# Patient Record
Sex: Male | Born: 1940 | Race: Black or African American | Hispanic: No | Marital: Married | Smoking: Former smoker
Health system: Southern US, Community
[De-identification: ages and names within clinical notes are randomized; demographics above are authoritative.]

## PROBLEM LIST (undated history)

## (undated) DIAGNOSIS — K449 Diaphragmatic hernia without obstruction or gangrene: Secondary | ICD-10-CM

## (undated) DIAGNOSIS — I2510 Atherosclerotic heart disease of native coronary artery without angina pectoris: Secondary | ICD-10-CM

## (undated) DIAGNOSIS — I219 Acute myocardial infarction, unspecified: Secondary | ICD-10-CM

## (undated) DIAGNOSIS — M109 Gout, unspecified: Secondary | ICD-10-CM

## (undated) DIAGNOSIS — E119 Type 2 diabetes mellitus without complications: Secondary | ICD-10-CM

## (undated) DIAGNOSIS — E785 Hyperlipidemia, unspecified: Secondary | ICD-10-CM

## (undated) DIAGNOSIS — M7710 Lateral epicondylitis, unspecified elbow: Secondary | ICD-10-CM

## (undated) DIAGNOSIS — K219 Gastro-esophageal reflux disease without esophagitis: Secondary | ICD-10-CM

## (undated) DIAGNOSIS — I739 Peripheral vascular disease, unspecified: Secondary | ICD-10-CM

## (undated) DIAGNOSIS — I10 Essential (primary) hypertension: Secondary | ICD-10-CM

## (undated) DIAGNOSIS — K2960 Other gastritis without bleeding: Secondary | ICD-10-CM

## (undated) DIAGNOSIS — I7300 Raynaud's syndrome without gangrene: Secondary | ICD-10-CM

## (undated) DIAGNOSIS — N189 Chronic kidney disease, unspecified: Secondary | ICD-10-CM

## (undated) HISTORY — DX: Essential (primary) hypertension: I10

## (undated) HISTORY — DX: Peripheral vascular disease, unspecified: I73.9

## (undated) HISTORY — DX: Gastro-esophageal reflux disease without esophagitis: K21.9

## (undated) HISTORY — PX: CORONARY ARTERY BYPASS GRAFT: SHX141

## (undated) HISTORY — DX: Gout, unspecified: M10.9

## (undated) HISTORY — DX: Other gastritis without bleeding: K29.60

## (undated) HISTORY — DX: Chronic kidney disease, unspecified: N18.9

## (undated) HISTORY — DX: Diaphragmatic hernia without obstruction or gangrene: K44.9

## (undated) HISTORY — DX: Atherosclerotic heart disease of native coronary artery without angina pectoris: I25.10

## (undated) HISTORY — DX: Type 2 diabetes mellitus without complications: E11.9

## (undated) HISTORY — DX: Acute myocardial infarction, unspecified: I21.9

## (undated) HISTORY — DX: Hyperlipidemia, unspecified: E78.5

## (undated) HISTORY — PX: COLONOSCOPY: SHX174

## (undated) HISTORY — DX: Lateral epicondylitis, unspecified elbow: M77.10

## (undated) HISTORY — PX: UPPER GASTROINTESTINAL ENDOSCOPY: SHX188

## (undated) HISTORY — DX: Raynaud's syndrome without gangrene: I73.00

---

## 1999-04-03 ENCOUNTER — Encounter: Payer: Self-pay | Admitting: Internal Medicine

## 2000-10-30 ENCOUNTER — Ambulatory Visit (HOSPITAL_COMMUNITY): Admission: RE | Admit: 2000-10-30 | Discharge: 2000-10-30 | Payer: Self-pay | Admitting: Gastroenterology

## 2000-10-30 ENCOUNTER — Encounter: Payer: Self-pay | Admitting: Gastroenterology

## 2001-03-18 ENCOUNTER — Encounter: Admission: RE | Admit: 2001-03-18 | Discharge: 2001-06-16 | Payer: Self-pay | Admitting: Internal Medicine

## 2001-07-20 ENCOUNTER — Ambulatory Visit (HOSPITAL_COMMUNITY): Admission: RE | Admit: 2001-07-20 | Discharge: 2001-07-20 | Payer: Self-pay | Admitting: *Deleted

## 2003-07-01 ENCOUNTER — Encounter: Payer: Self-pay | Admitting: Internal Medicine

## 2003-07-01 LAB — CONVERTED CEMR LAB

## 2004-06-19 ENCOUNTER — Ambulatory Visit: Payer: Self-pay | Admitting: Internal Medicine

## 2004-07-18 ENCOUNTER — Ambulatory Visit: Payer: Self-pay | Admitting: Gastroenterology

## 2004-07-19 ENCOUNTER — Ambulatory Visit: Payer: Self-pay | Admitting: Gastroenterology

## 2004-08-05 ENCOUNTER — Ambulatory Visit (HOSPITAL_COMMUNITY): Admission: RE | Admit: 2004-08-05 | Discharge: 2004-08-05 | Payer: Self-pay | Admitting: Gastroenterology

## 2004-08-13 ENCOUNTER — Ambulatory Visit: Payer: Self-pay | Admitting: Gastroenterology

## 2004-09-12 ENCOUNTER — Ambulatory Visit: Payer: Self-pay | Admitting: Gastroenterology

## 2004-09-13 ENCOUNTER — Ambulatory Visit: Payer: Self-pay | Admitting: Gastroenterology

## 2004-09-17 ENCOUNTER — Ambulatory Visit (HOSPITAL_COMMUNITY): Admission: RE | Admit: 2004-09-17 | Discharge: 2004-09-17 | Payer: Self-pay | Admitting: Gastroenterology

## 2004-09-27 ENCOUNTER — Ambulatory Visit: Payer: Self-pay | Admitting: Gastroenterology

## 2004-11-28 ENCOUNTER — Ambulatory Visit: Payer: Self-pay | Admitting: Gastroenterology

## 2005-10-22 ENCOUNTER — Ambulatory Visit: Payer: Self-pay | Admitting: Internal Medicine

## 2005-10-29 ENCOUNTER — Ambulatory Visit: Payer: Self-pay | Admitting: Internal Medicine

## 2005-12-23 ENCOUNTER — Ambulatory Visit: Payer: Self-pay | Admitting: Internal Medicine

## 2005-12-30 ENCOUNTER — Ambulatory Visit: Payer: Self-pay | Admitting: Internal Medicine

## 2006-01-05 ENCOUNTER — Ambulatory Visit: Payer: Self-pay

## 2006-03-04 ENCOUNTER — Ambulatory Visit: Payer: Self-pay | Admitting: Internal Medicine

## 2006-03-11 ENCOUNTER — Encounter: Admission: RE | Admit: 2006-03-11 | Discharge: 2006-03-11 | Payer: Self-pay | Admitting: Internal Medicine

## 2006-03-11 ENCOUNTER — Ambulatory Visit: Payer: Self-pay | Admitting: Internal Medicine

## 2006-07-16 ENCOUNTER — Encounter: Payer: Self-pay | Admitting: Internal Medicine

## 2006-07-23 ENCOUNTER — Ambulatory Visit: Payer: Self-pay | Admitting: Internal Medicine

## 2006-07-23 LAB — CONVERTED CEMR LAB
Cholesterol: 183 mg/dL (ref 0–200)
HDL: 33.6 mg/dL — ABNORMAL LOW (ref >39.0)
LDL Cholesterol: 127 mg/dL — ABNORMAL HIGH (ref 0–99)
Testosterone: 387.18 ng/dL (ref 350.00–890)
Total CHOL/HDL Ratio: 5.4
Triglycerides: 110 mg/dL (ref 0–149)
VLDL: 22 mg/dL (ref 0–40)

## 2006-08-03 ENCOUNTER — Ambulatory Visit: Payer: Self-pay | Admitting: Oncology

## 2006-08-27 ENCOUNTER — Ambulatory Visit: Payer: Self-pay | Admitting: Internal Medicine

## 2006-08-27 LAB — CONVERTED CEMR LAB
ALT: 17 units/L (ref 0–40)
AST: 21 units/L (ref 0–37)
Albumin: 3.9 g/dL (ref 3.5–5.2)
Alkaline Phosphatase: 46 units/L (ref 39–117)
BUN: 18 mg/dL (ref 6–23)
Bilirubin, Direct: 0.2 mg/dL (ref 0.0–0.3)
CO2: 28 meq/L (ref 19–32)
Calcium: 9.4 mg/dL (ref 8.4–10.5)
Chloride: 103 meq/L (ref 96–112)
Cholesterol: 123 mg/dL (ref 0–200)
Creatinine, Ser: 1.5 mg/dL (ref 0.4–1.5)
GFR calc Af Amer: 61 mL/min
GFR calc non Af Amer: 50 mL/min
Glucose, Bld: 133 mg/dL — ABNORMAL HIGH (ref 70–99)
HDL: 32.2 mg/dL — ABNORMAL LOW (ref >39.0)
LDL Cholesterol: 74 mg/dL (ref 0–99)
Phosphorus: 3.4 mg/dL (ref 2.3–4.6)
Potassium: 5 meq/L (ref 3.5–5.1)
Sodium: 138 meq/L (ref 135–145)
Total Bilirubin: 0.8 mg/dL (ref 0.3–1.2)
Total CHOL/HDL Ratio: 3.8
Total Protein: 6.7 g/dL (ref 6.0–8.3)
Triglycerides: 83 mg/dL (ref 0–149)
VLDL: 17 mg/dL (ref 0–40)

## 2006-09-01 LAB — CBC WITH DIFFERENTIAL/PLATELET
BASO%: 0.4 % (ref 0.0–2.0)
Basophils Absolute: 0 10*3/uL (ref 0.0–0.1)
HCT: 45 % (ref 38.7–49.9)
HGB: 15.3 g/dL (ref 13.0–17.1)
MONO#: 0.3 10*3/uL (ref 0.1–0.9)
NEUT%: 67.6 % (ref 40.0–75.0)
RDW: 14.3 % (ref 11.2–14.6)
WBC: 4.4 10*3/uL (ref 4.0–10.0)
lymph#: 1 10*3/uL (ref 0.9–3.3)

## 2006-09-03 ENCOUNTER — Ambulatory Visit: Payer: Self-pay | Admitting: Internal Medicine

## 2006-09-03 LAB — CONVERTED CEMR LAB: Testosterone: 504.09 ng/dL (ref 350.00–890)

## 2006-09-04 LAB — SPEP & IFE WITH QIG
Alpha-2-Globulin: 9.8 % (ref 7.1–11.8)
Beta Globulin: 6.6 % (ref 4.7–7.2)
Gamma Globulin: 17.5 % (ref 11.1–18.8)
IgG (Immunoglobin G), Serum: 1380 mg/dL (ref 694–1618)
M-Spike, %: 0.37 g/dL

## 2006-09-04 LAB — LACTATE DEHYDROGENASE: LDH: 171 U/L (ref 94–250)

## 2006-09-04 LAB — COMPREHENSIVE METABOLIC PANEL
ALT: 12 U/L (ref 0–53)
AST: 18 U/L (ref 0–37)
Albumin: 4.4 g/dL (ref 3.5–5.2)
CO2: 29 mEq/L (ref 19–32)
Calcium: 9.5 mg/dL (ref 8.4–10.5)
Chloride: 106 mEq/L (ref 96–112)
Creatinine, Ser: 1.39 mg/dL (ref 0.40–1.50)
Potassium: 4.5 mEq/L (ref 3.5–5.3)

## 2006-09-04 LAB — KAPPA/LAMBDA LIGHT CHAINS: Kappa:Lambda Ratio: 1.95 — ABNORMAL HIGH (ref 0.26–1.65)

## 2006-09-09 LAB — UIFE/LIGHT CHAINS/TP QN, 24-HR UR
Albumin, U: DETECTED
Free Lambda Excretion/Day: 1.79 mg/d
Free Lambda Lt Chains,Ur: 0.11 mg/dL (ref 0.08–1.01)
Free Lt Chn Excr Rate: 25.35 mg/d
Time: 24 hours
Total Protein, Urine-Ur/day: 37 mg/d (ref 10–140)
Volume, Urine: 1625 mL

## 2006-09-15 ENCOUNTER — Ambulatory Visit (HOSPITAL_COMMUNITY): Admission: RE | Admit: 2006-09-15 | Discharge: 2006-09-15 | Payer: Self-pay | Admitting: Oncology

## 2006-09-18 ENCOUNTER — Ambulatory Visit: Payer: Self-pay | Admitting: Oncology

## 2006-12-28 ENCOUNTER — Encounter: Payer: Self-pay | Admitting: Internal Medicine

## 2006-12-28 DIAGNOSIS — E785 Hyperlipidemia, unspecified: Secondary | ICD-10-CM

## 2006-12-28 DIAGNOSIS — M1A9XX1 Chronic gout, unspecified, with tophus (tophi): Secondary | ICD-10-CM

## 2006-12-28 DIAGNOSIS — M109 Gout, unspecified: Secondary | ICD-10-CM

## 2006-12-28 DIAGNOSIS — E119 Type 2 diabetes mellitus without complications: Secondary | ICD-10-CM

## 2006-12-28 DIAGNOSIS — I2510 Atherosclerotic heart disease of native coronary artery without angina pectoris: Secondary | ICD-10-CM

## 2006-12-28 DIAGNOSIS — E1159 Type 2 diabetes mellitus with other circulatory complications: Secondary | ICD-10-CM

## 2006-12-28 DIAGNOSIS — I10 Essential (primary) hypertension: Secondary | ICD-10-CM

## 2006-12-28 DIAGNOSIS — E1169 Type 2 diabetes mellitus with other specified complication: Secondary | ICD-10-CM

## 2006-12-28 DIAGNOSIS — E1129 Type 2 diabetes mellitus with other diabetic kidney complication: Secondary | ICD-10-CM | POA: Insufficient documentation

## 2006-12-28 HISTORY — DX: Type 2 diabetes mellitus without complications: E11.9

## 2006-12-28 HISTORY — DX: Atherosclerotic heart disease of native coronary artery without angina pectoris: I25.10

## 2006-12-28 HISTORY — DX: Hyperlipidemia, unspecified: E78.5

## 2006-12-28 HISTORY — DX: Essential (primary) hypertension: I10

## 2006-12-28 HISTORY — DX: Gout, unspecified: M10.9

## 2007-01-20 ENCOUNTER — Ambulatory Visit: Payer: Self-pay | Admitting: Oncology

## 2007-01-22 ENCOUNTER — Encounter: Payer: Self-pay | Admitting: Internal Medicine

## 2007-01-22 LAB — CBC WITH DIFFERENTIAL/PLATELET
BASO%: 0.4 % (ref 0.0–2.0)
EOS%: 3.9 % (ref 0.0–7.0)
LYMPH%: 19.4 % (ref 14.0–48.0)
MCH: 29.2 pg (ref 28.0–33.4)
MCHC: 34.7 g/dL (ref 32.0–35.9)
MONO#: 0.4 10*3/uL (ref 0.1–0.9)
NEUT%: 68.8 % (ref 40.0–75.0)
Platelets: 189 10*3/uL (ref 145–400)
RBC: 5.4 10*6/uL (ref 4.20–5.71)
WBC: 5.4 10*3/uL (ref 4.0–10.0)

## 2007-01-26 ENCOUNTER — Ambulatory Visit: Payer: Self-pay | Admitting: Internal Medicine

## 2007-01-26 LAB — COMPREHENSIVE METABOLIC PANEL
ALT: 21 U/L (ref 0–53)
AST: 19 U/L (ref 0–37)
Alkaline Phosphatase: 57 U/L (ref 39–117)
CO2: 26 mEq/L (ref 19–32)
Creatinine, Ser: 1.41 mg/dL (ref 0.40–1.50)
Sodium: 141 mEq/L (ref 135–145)
Total Bilirubin: 0.4 mg/dL (ref 0.3–1.2)
Total Protein: 7 g/dL (ref 6.0–8.3)

## 2007-01-26 LAB — SPEP & IFE WITH QIG
Albumin ELP: 58.2 % (ref 55.8–66.1)
Alpha-1-Globulin: 4 % (ref 2.9–4.9)
Gamma Globulin: 18.3 % (ref 11.1–18.8)
IgM, Serum: 197 mg/dL (ref 60–263)
Total Protein, Serum Electrophoresis: 7 g/dL (ref 6.0–8.3)

## 2007-01-26 LAB — LACTATE DEHYDROGENASE: LDH: 166 U/L (ref 94–250)

## 2007-01-27 LAB — CONVERTED CEMR LAB
ALT: 25 units/L (ref 0–53)
AST: 22 units/L (ref 0–37)
Albumin: 3.7 g/dL (ref 3.5–5.2)
Alkaline Phosphatase: 52 units/L (ref 39–117)
Bilirubin, Direct: 0.1 mg/dL (ref 0.0–0.3)
Cholesterol: 148 mg/dL (ref 0–200)
HDL: 22.3 mg/dL — ABNORMAL LOW (ref >39.0)
Hgb A1c MFr Bld: 7.3 % — ABNORMAL HIGH (ref 4.6–6.0)
LDL Cholesterol: 99 mg/dL (ref 0–99)
Total Bilirubin: 0.8 mg/dL (ref 0.3–1.2)
Total CHOL/HDL Ratio: 6.6
Total Protein: 6.5 g/dL (ref 6.0–8.3)
Triglycerides: 134 mg/dL (ref 0–149)
VLDL: 27 mg/dL (ref 0–40)

## 2007-05-10 ENCOUNTER — Ambulatory Visit: Payer: Self-pay | Admitting: Internal Medicine

## 2007-05-10 LAB — CONVERTED CEMR LAB
ALT: 23 units/L (ref 0–53)
AST: 23 units/L (ref 0–37)
Albumin: 3.8 g/dL (ref 3.5–5.2)
Alkaline Phosphatase: 58 units/L (ref 39–117)
BUN: 18 mg/dL (ref 6–23)
Bilirubin, Direct: 0.1 mg/dL (ref 0.0–0.3)
CO2: 33 meq/L — ABNORMAL HIGH (ref 19–32)
Calcium: 9.9 mg/dL (ref 8.4–10.5)
Chloride: 104 meq/L (ref 96–112)
Cholesterol: 148 mg/dL (ref 0–200)
Creatinine, Ser: 1.4 mg/dL (ref 0.4–1.5)
GFR calc Af Amer: 66 mL/min
GFR calc non Af Amer: 54 mL/min
Glucose, Bld: 142 mg/dL — ABNORMAL HIGH (ref 70–99)
HDL: 30.9 mg/dL — ABNORMAL LOW (ref >39.0)
Hgb A1c MFr Bld: 7.5 % — ABNORMAL HIGH (ref 4.6–6.0)
LDL Cholesterol: 78 mg/dL (ref 0–99)
Potassium: 5.1 meq/L (ref 3.5–5.1)
Sodium: 143 meq/L (ref 135–145)
Total Bilirubin: 0.8 mg/dL (ref 0.3–1.2)
Total CHOL/HDL Ratio: 4.8
Total Protein: 7.1 g/dL (ref 6.0–8.3)
Triglycerides: 195 mg/dL — ABNORMAL HIGH (ref 0–149)
VLDL: 39 mg/dL (ref 0–40)

## 2007-05-14 ENCOUNTER — Ambulatory Visit: Payer: Self-pay | Admitting: Internal Medicine

## 2007-05-14 DIAGNOSIS — I739 Peripheral vascular disease, unspecified: Secondary | ICD-10-CM

## 2007-05-14 HISTORY — DX: Peripheral vascular disease, unspecified: I73.9

## 2007-05-26 ENCOUNTER — Encounter: Payer: Self-pay | Admitting: Internal Medicine

## 2007-05-26 ENCOUNTER — Ambulatory Visit: Payer: Self-pay

## 2007-06-10 ENCOUNTER — Telehealth: Payer: Self-pay | Admitting: Internal Medicine

## 2007-06-14 ENCOUNTER — Ambulatory Visit: Payer: Self-pay | Admitting: Cardiovascular Disease

## 2007-06-14 LAB — CONVERTED CEMR LAB
CO2: 31 meq/L (ref 19–32)
Chloride: 102 meq/L (ref 96–112)
Creatinine, Ser: 1.5 mg/dL (ref 0.4–1.5)
Sodium: 140 meq/L (ref 135–145)

## 2007-06-16 ENCOUNTER — Ambulatory Visit: Payer: Self-pay | Admitting: Cardiovascular Disease

## 2007-07-28 ENCOUNTER — Ambulatory Visit: Payer: Self-pay | Admitting: Oncology

## 2007-07-30 ENCOUNTER — Encounter: Payer: Self-pay | Admitting: Internal Medicine

## 2007-07-30 LAB — CBC WITH DIFFERENTIAL/PLATELET
BASO%: 0.1 % (ref 0.0–2.0)
Eosinophils Absolute: 0.2 10*3/uL (ref 0.0–0.5)
HCT: 45.8 % (ref 38.7–49.9)
HGB: 15.2 g/dL (ref 13.0–17.1)
MCHC: 33.2 g/dL (ref 32.0–35.9)
MONO#: 0.4 10*3/uL (ref 0.1–0.9)
NEUT#: 3.4 10*3/uL (ref 1.5–6.5)
NEUT%: 65.9 % (ref 40.0–75.0)
WBC: 5.1 10*3/uL (ref 4.0–10.0)
lymph#: 1.1 10*3/uL (ref 0.9–3.3)

## 2007-08-04 LAB — SPEP & IFE WITH QIG
Albumin ELP: 57.2 % (ref 55.8–66.1)
Alpha-2-Globulin: 10.4 % (ref 7.1–11.8)
Beta Globulin: 6.3 % (ref 4.7–7.2)
Total Protein, Serum Electrophoresis: 7.5 g/dL (ref 6.0–8.3)

## 2007-08-04 LAB — COMPREHENSIVE METABOLIC PANEL
ALT: 14 U/L (ref 0–53)
CO2: 29 mEq/L (ref 19–32)
Calcium: 9.7 mg/dL (ref 8.4–10.5)
Chloride: 103 mEq/L (ref 96–112)
Sodium: 142 mEq/L (ref 135–145)
Total Protein: 7.5 g/dL (ref 6.0–8.3)

## 2007-08-04 LAB — KAPPA/LAMBDA LIGHT CHAINS: Kappa free light chain: 1.93 mg/dL (ref 0.33–1.94)

## 2007-09-03 ENCOUNTER — Encounter: Payer: Self-pay | Admitting: Internal Medicine

## 2007-09-06 ENCOUNTER — Ambulatory Visit: Payer: Self-pay | Admitting: Cardiovascular Disease

## 2007-10-25 ENCOUNTER — Telehealth: Payer: Self-pay | Admitting: Internal Medicine

## 2008-01-21 ENCOUNTER — Ambulatory Visit: Payer: Self-pay | Admitting: Oncology

## 2008-03-03 ENCOUNTER — Ambulatory Visit: Payer: Self-pay | Admitting: Oncology

## 2008-03-03 ENCOUNTER — Ambulatory Visit: Payer: Self-pay | Admitting: Internal Medicine

## 2008-03-08 ENCOUNTER — Ambulatory Visit: Payer: Self-pay | Admitting: Oncology

## 2008-03-08 ENCOUNTER — Encounter: Payer: Self-pay | Admitting: Internal Medicine

## 2008-03-08 LAB — CBC WITH DIFFERENTIAL/PLATELET
BASO%: 0.4 % (ref 0.0–2.0)
Eosinophils Absolute: 0.2 10*3/uL (ref 0.0–0.5)
HCT: 45.1 % (ref 38.7–49.9)
LYMPH%: 18.9 % (ref 14.0–48.0)
MCHC: 33.4 g/dL (ref 32.0–35.9)
MONO#: 0.3 10*3/uL (ref 0.1–0.9)
NEUT#: 2.9 10*3/uL (ref 1.5–6.5)
NEUT%: 68.3 % (ref 40.0–75.0)
Platelets: 175 10*3/uL (ref 145–400)
RBC: 5.19 10*6/uL (ref 4.20–5.71)
WBC: 4.3 10*3/uL (ref 4.0–10.0)
lymph#: 0.8 10*3/uL — ABNORMAL LOW (ref 0.9–3.3)

## 2008-03-10 LAB — COMPREHENSIVE METABOLIC PANEL
ALT: 16 U/L (ref 0–53)
CO2: 27 mEq/L (ref 19–32)
Calcium: 9.6 mg/dL (ref 8.4–10.5)
Chloride: 104 mEq/L (ref 96–112)
Glucose, Bld: 161 mg/dL — ABNORMAL HIGH (ref 70–99)
Sodium: 140 mEq/L (ref 135–145)
Total Bilirubin: 0.6 mg/dL (ref 0.3–1.2)
Total Protein: 7.1 g/dL (ref 6.0–8.3)

## 2008-03-10 LAB — SPEP & IFE WITH QIG
Albumin ELP: 57.5 % (ref 55.8–66.1)
Alpha-1-Globulin: 3.4 % (ref 2.9–4.9)
IgM, Serum: 181 mg/dL (ref 60–263)
Total Protein, Serum Electrophoresis: 7.1 g/dL (ref 6.0–8.3)

## 2008-05-31 ENCOUNTER — Encounter: Payer: Self-pay | Admitting: Internal Medicine

## 2008-06-09 ENCOUNTER — Ambulatory Visit: Payer: Self-pay | Admitting: Internal Medicine

## 2008-06-12 LAB — CONVERTED CEMR LAB
ALT: 15 units/L (ref 0–53)
BUN: 19 mg/dL (ref 6–23)
Cholesterol: 133 mg/dL (ref 0–200)
Creatinine, Ser: 1.4 mg/dL (ref 0.4–1.5)
GFR calc Af Amer: 65 mL/min
GFR calc non Af Amer: 54 mL/min
LDL Cholesterol: 81 mg/dL (ref 0–99)
Potassium: 4.3 meq/L (ref 3.5–5.1)
Total CHOL/HDL Ratio: 3.2
VLDL: 10 mg/dL (ref 0–40)

## 2008-06-14 ENCOUNTER — Ambulatory Visit: Payer: Self-pay | Admitting: Internal Medicine

## 2008-08-30 ENCOUNTER — Ambulatory Visit: Payer: Self-pay | Admitting: Internal Medicine

## 2008-08-30 LAB — CONVERTED CEMR LAB
ALT: 17 units/L (ref 0–53)
AST: 22 units/L (ref 0–37)
Albumin: 3.6 g/dL (ref 3.5–5.2)
BUN: 18 mg/dL (ref 6–23)
CO2: 32 meq/L (ref 19–32)
Calcium: 9.6 mg/dL (ref 8.4–10.5)
Creatinine, Ser: 1.4 mg/dL (ref 0.4–1.5)
Glucose, Bld: 87 mg/dL (ref 70–99)
Hgb A1c MFr Bld: 7.5 % — ABNORMAL HIGH (ref 4.6–6.0)
LDL Cholesterol: 79 mg/dL (ref 0–99)
Potassium: 3.7 meq/L (ref 3.5–5.1)
Sodium: 144 meq/L (ref 135–145)
Total Protein: 7 g/dL (ref 6.0–8.3)
VLDL: 13 mg/dL (ref 0–40)

## 2008-09-15 ENCOUNTER — Ambulatory Visit: Payer: Self-pay | Admitting: Internal Medicine

## 2008-09-15 DIAGNOSIS — N183 Chronic kidney disease, stage 3 (moderate): Secondary | ICD-10-CM

## 2008-09-15 DIAGNOSIS — N189 Chronic kidney disease, unspecified: Secondary | ICD-10-CM

## 2008-09-15 HISTORY — DX: Chronic kidney disease, unspecified: N18.9

## 2008-10-04 ENCOUNTER — Ambulatory Visit: Payer: Self-pay | Admitting: Oncology

## 2009-01-05 ENCOUNTER — Ambulatory Visit: Payer: Self-pay | Admitting: Internal Medicine

## 2009-01-05 ENCOUNTER — Encounter: Payer: Self-pay | Admitting: Internal Medicine

## 2009-01-18 LAB — CONVERTED CEMR LAB
ALT: 19 units/L (ref 0–53)
Alkaline Phosphatase: 49 units/L (ref 39–117)
BUN: 23 mg/dL (ref 6–23)
Bilirubin, Direct: 0.1 mg/dL (ref 0.0–0.3)
Cholesterol: 134 mg/dL (ref 0–200)
Creatinine, Ser: 1.5 mg/dL (ref 0.4–1.5)
GFR calc non Af Amer: 60.15 mL/min (ref >60)
Glucose, Bld: 88 mg/dL (ref 70–99)
Hgb A1c MFr Bld: 7.7 % — ABNORMAL HIGH (ref 4.6–6.5)
Potassium: 4.7 meq/L (ref 3.5–5.1)
Total Bilirubin: 0.9 mg/dL (ref 0.3–1.2)
Total Protein: 7.4 g/dL (ref 6.0–8.3)
VLDL: 12.8 mg/dL (ref 0.0–40.0)

## 2009-01-25 ENCOUNTER — Ambulatory Visit: Payer: Self-pay | Admitting: Internal Medicine

## 2009-04-04 ENCOUNTER — Telehealth: Payer: Self-pay | Admitting: Internal Medicine

## 2009-04-26 ENCOUNTER — Ambulatory Visit: Payer: Self-pay | Admitting: Internal Medicine

## 2009-04-26 LAB — CONVERTED CEMR LAB
AST: 22 units/L (ref 0–37)
Alkaline Phosphatase: 45 units/L (ref 39–117)
Bilirubin, Direct: 0 mg/dL (ref 0.0–0.3)
CO2: 29 meq/L (ref 19–32)
Calcium: 8.7 mg/dL (ref 8.4–10.5)
GFR calc non Af Amer: 52.01 mL/min (ref >60)
HDL: 39.7 mg/dL (ref >39.00)
Potassium: 5.2 meq/L — ABNORMAL HIGH (ref 3.5–5.1)
Sodium: 139 meq/L (ref 135–145)
Total CHOL/HDL Ratio: 4
VLDL: 12 mg/dL (ref 0.0–40.0)

## 2009-05-03 ENCOUNTER — Ambulatory Visit: Payer: Self-pay | Admitting: Internal Medicine

## 2009-08-07 ENCOUNTER — Telehealth: Payer: Self-pay | Admitting: Internal Medicine

## 2009-08-07 ENCOUNTER — Ambulatory Visit: Payer: Self-pay | Admitting: Internal Medicine

## 2009-08-07 LAB — CONVERTED CEMR LAB
Albumin: 3.8 g/dL (ref 3.5–5.2)
CO2: 34 meq/L — ABNORMAL HIGH (ref 19–32)
Chloride: 108 meq/L (ref 96–112)
Glucose, Bld: 99 mg/dL (ref 70–99)
HDL: 40.6 mg/dL (ref >39.00)
LDL Cholesterol: 79 mg/dL (ref 0–99)
Potassium: 5.2 meq/L — ABNORMAL HIGH (ref 3.5–5.1)
Sodium: 146 meq/L — ABNORMAL HIGH (ref 135–145)
Total Bilirubin: 0.5 mg/dL (ref 0.3–1.2)
Total CHOL/HDL Ratio: 3
Triglycerides: 57 mg/dL (ref 0.0–149.0)

## 2009-08-13 ENCOUNTER — Ambulatory Visit: Payer: Self-pay | Admitting: Internal Medicine

## 2009-09-04 ENCOUNTER — Encounter: Payer: Self-pay | Admitting: Internal Medicine

## 2009-09-19 ENCOUNTER — Telehealth: Payer: Self-pay | Admitting: Internal Medicine

## 2009-10-31 ENCOUNTER — Ambulatory Visit: Payer: Self-pay | Admitting: Internal Medicine

## 2009-11-01 ENCOUNTER — Telehealth: Payer: Self-pay | Admitting: Internal Medicine

## 2009-11-29 ENCOUNTER — Encounter: Payer: Self-pay | Admitting: Internal Medicine

## 2009-12-04 ENCOUNTER — Ambulatory Visit: Payer: Self-pay | Admitting: Internal Medicine

## 2009-12-04 LAB — CONVERTED CEMR LAB
Albumin: 4 g/dL (ref 3.5–5.2)
Alkaline Phosphatase: 51 units/L (ref 39–117)
Chloride: 104 meq/L (ref 96–112)
Cholesterol: 164 mg/dL (ref 0–200)
Creatinine, Ser: 1.6 mg/dL — ABNORMAL HIGH (ref 0.4–1.5)
HDL: 39.9 mg/dL (ref >39.00)
Hgb A1c MFr Bld: 8.1 % — ABNORMAL HIGH (ref 4.6–6.5)
LDL Cholesterol: 103 mg/dL — ABNORMAL HIGH (ref 0–99)
Sodium: 144 meq/L (ref 135–145)
Total Protein: 7.1 g/dL (ref 6.0–8.3)
Triglycerides: 107 mg/dL (ref 0.0–149.0)
VLDL: 21.4 mg/dL (ref 0.0–40.0)

## 2009-12-11 ENCOUNTER — Ambulatory Visit: Payer: Self-pay | Admitting: Internal Medicine

## 2009-12-11 DIAGNOSIS — M7710 Lateral epicondylitis, unspecified elbow: Secondary | ICD-10-CM | POA: Insufficient documentation

## 2009-12-11 HISTORY — DX: Lateral epicondylitis, unspecified elbow: M77.10

## 2010-04-03 ENCOUNTER — Ambulatory Visit: Payer: Self-pay | Admitting: Internal Medicine

## 2010-04-03 LAB — CONVERTED CEMR LAB
ALT: 12 units/L (ref 0–53)
AST: 19 units/L (ref 0–37)
Albumin: 3.7 g/dL (ref 3.5–5.2)
BUN: 22 mg/dL (ref 6–23)
Chloride: 107 meq/L (ref 96–112)
Cholesterol: 153 mg/dL (ref 0–200)
GFR calc non Af Amer: 59.47 mL/min (ref >60)
Glucose, Bld: 135 mg/dL — ABNORMAL HIGH (ref 70–99)
Hgb A1c MFr Bld: 8.1 % — ABNORMAL HIGH (ref 4.6–6.5)
Potassium: 4.6 meq/L (ref 3.5–5.1)
Triglycerides: 71 mg/dL (ref 0.0–149.0)
VLDL: 14.2 mg/dL (ref 0.0–40.0)

## 2010-04-10 ENCOUNTER — Ambulatory Visit: Payer: Self-pay | Admitting: Internal Medicine

## 2010-05-10 ENCOUNTER — Encounter: Payer: Self-pay | Admitting: Internal Medicine

## 2010-06-30 HISTORY — PX: KNEE ARTHROSCOPY: SUR90

## 2010-07-20 ENCOUNTER — Encounter: Payer: Self-pay | Admitting: Nephrology

## 2010-08-01 NOTE — Progress Notes (Signed)
Summary: Pt req med for gout as previously discussed  Phone Note Call from Patient Call back at Home Phone [phone]   Caller: Patient Summary of Call: Pt has flare up of gout. Pt says that Dr. Cato Mulligan, said that there was med he could recommend for it.  Initial call taken by: Lucy Antigua,  September 19, 2009 2:35 PM  Follow-up for Phone Call        trial colchicine 0.6 mg by mouth three times a day for 2 days then once daily for 2 days then stop may cause diarrhea. if recurrence is frequent then there is a medication to take daily that may prevent gout Follow-up by: Birdie Sons MD,  September 19, 2009 6:52 PM    New/Updated Medications: COLCRYS 0.6 MG TABS (COLCHICINE) take one tab three times a day for 2 days , one tab once daily for 2 days, then stop Prescriptions: COLCRYS 0.6 MG TABS (COLCHICINE) take one tab three times a day for 2 days , one tab once daily for 2 days, then stop  #8 x 0   Entered by:   Kern Reap CMA (AAMA)   Authorized by:   Birdie Sons MD   Signed by:   Kern Reap CMA (AAMA) on 09/20/2009   Method used:   Electronically to        [facility]  [address] [phone]* (retail)       [address]       [place], [place]  [id number]       Ph: [phone] or [phone]       Fax: [phone]   RxID:   [id number]

## 2010-08-01 NOTE — Progress Notes (Signed)
Summary: bloodwork results  Phone Note Call from Patient Call back at Home Phone [phone]   Caller: Patient Call For: Birdie Sons MD Summary of Call: pt needs lab results Initial call taken by: Heron Sabins,  Nov 01, 2009 4:40 PM  Follow-up for Phone Call        Pt called back again to check on status of labs that were done on Wed 10/31/09. Please call pt asap today.  Follow-up by: Lucy Antigua,  Nov 02, 2009 11:54 AM  Additional Follow-up for Phone Call Additional follow up Details #1::        Patient notified.  Additional Follow-up by: Gladis Riffle, RN,  Nov 02, 2009 1:03 PM

## 2010-08-01 NOTE — Assessment & Plan Note (Signed)
Summary: 4 month rov/njr   Vital Signs:  Patient profile:   70 year old male Weight:      200 pounds BMI:     27.22 Temp:     97.8 degrees F oral Pulse rate:   60 / minute Pulse rhythm:   regular Resp:     12 per minute BP sitting:   120 / 62  (left arm) Cuff size:   regular  Vitals Entered By: Gladis Riffle, RN (December 11, 2009 9:43 AM)  Nutrition Counseling: Patient's BMI is greater than 25 and therefore counseled on weight management options. CC: 4 month rov, labs done--CBGs around 100 at home Is Patient Diabetic? Yes Did you bring your meter with you today? No   CC:  4 month rov and labs done--CBGs around 100 at home.  History of Present Illness:  Follow-Up Visit      This is a 70 year old man who presents for Follow-up visit.  The patient denies chest pain and palpitations.  Since the last visit the patient notes no new problems or concerns.  The patient reports taking meds as prescribed.  When questioned about possible medication side effects, the patient notes none.  he admits to poor dietary compliance  All other systems reviewed and were negative   Preventive Screening-Counseling & Management  Alcohol-Tobacco     Smoking Status: quit > 6 months     Year Started: 1981     Year Quit: 1985  Current Problems (verified): 1)  Renal Failure, Chronic  (ICD-585.9) 2)  Claudication  (ICD-443.9) 3)  Hypertension  (ICD-401.9) 4)  Hyperlipidemia  (ICD-272.4) 5)  Gout  (ICD-274.9) 6)  Diabetes Mellitus, Type II  (ICD-250.00) 7)  Coronary Artery Disease  (ICD-414.00)  Current Medications (verified): 1)  Amaryl 1 Mg Tabs (Glimepiride) .Marland Kitchen.. 1 Once Daily 2)  Aspir-81 81 Mg Tbec (Aspirin) .... Take 1 Tablet By Mouth Once A Day 3)  Thalitone 15 Mg  Tabs (Chlorthalidone) .... Take 1 Tablet By Mouth Once A Day 4)  Lisinopril 40 Mg Tabs (Lisinopril) .Marland Kitchen.. 1 By Mouth Once Daily 5)  Simvastatin 40 Mg Tabs (Simvastatin) .... Take One Tablet At Bedtime 6)  Actos 30 Mg Tabs (Pioglitazone  Hcl) .... 1/2 Tablet Q Daily 7)  Toprol Xl 100 Mg Xr24h-Tab (Metoprolol Succinate) .... 1/2 Tab Q Daily 8)  Novolin 70/30 70-30 % Susp (Insulin Isophane & Regular) .... 20 Units Two Times A Day  Please Supply 30 Unit Syringes With Needles #100/3 9)  Bd Insulin Syringe Ultrafine 31g X 5/16" 0.3 Ml Misc (Insulin Syringe-Needle U-100) .... Use As Directed 10)  Align  Caps (Probiotic Product) .... Two Times A Day Tab 11)  Colcrys 0.6 Mg Tabs (Colchicine) .... Take One Tab Three Times A Day For 2 Days , One Tab Once Daily For 2 Days, Then Stop 12)  Cyclobenzaprine Hcl 10 Mg  Tabs (Cyclobenzaprine Hcl) .Marland Kitchen.. 1 By Mouth 2 Times Daily As Needed For Back Pain  Allergies: 1)  Penicillin G Potassium (Penicillin G Potassium) 2)  Tetracycline Hcl (Tetracycline Hcl) 3)  Zocor (Simvastatin)  Past History:  Past Medical History: Last updated: 10/13/2008 Coronary artery disease 1997, mild MI, abdominal pain Diabetes mellitus, type II 1997 Gout Hyperlipidemia Hypertension age 53 Duodenal ulcer Erosive gastritis Allergies Hiatal hernia GERD Hemorrhoids  Past Surgical History: Last updated: 12/28/2006 Coronary artery bypass graft 1997  Family History: Last updated: 01/26/2007 Family History Diabetes 1st degree relative-mother Family History Hypertension Family History of Stroke M 1st  degree relative  father  Social History: Last updated: 01/26/2007  Never Smoked Regular exercise-no Retired Married  Risk Factors: Exercise: no (01/26/2007)  Risk Factors: Smoking Status: quit > 6 months (12/11/2009)  Physical Exam  General:  alert and well-developed.   Head:  normocephalic and atraumatic.   Eyes:  pupils equal and pupils round.   Ears:  R ear normal and L ear normal.   Neck:  No deformities, masses, or tenderness noted. Lungs:  normal respiratory effort and no intercostal retractions.   Heart:  normal rate and regular rhythm.   Abdomen:  soft and non-tender.   Msk:  No  deformity or scoliosis noted of thoracic or lumbar spine.   Neurologic:  cranial nerves II-XII intact and gait normal.     Impression & Recommendations:  Problem # 1:  DIABETES MELLITUS, TYPE II (ICD-250.00) not controlled discussed need for exercise and modest weight loss he will exercise every day His updated medication list for this problem includes:    Amaryl 1 Mg Tabs (Glimepiride) .Marland Kitchen... 1 once daily    Aspir-81 81 Mg Tbec (Aspirin) .Marland Kitchen... Take 1 tablet by mouth once a day    Lisinopril 40 Mg Tabs (Lisinopril) .Marland Kitchen... 1 by mouth once daily    Actos 30 Mg Tabs (Pioglitazone hcl) .Marland Kitchen... 1/2 tablet q daily    Novolin 70/30 70-30 % Susp (Insulin isophane & regular) .Marland Kitchen... 20 units two times a day  please supply 30 unit syringes with needles #100/3  Labs Reviewed: Creat: 1.6 (12/04/2009)     Last Eye Exam: normal (06/30/2009) Reviewed HgBA1c results: 8.1 (12/04/2009)  7.5 (08/07/2009)  Problem # 2:  CORONARY ARTERY DISEASE (ICD-414.00)  no sxs continue current medications  His updated medication list for this problem includes:    Aspir-81 81 Mg Tbec (Aspirin) .Marland Kitchen... Take 1 tablet by mouth once a day    Thalitone 15 Mg Tabs (Chlorthalidone) .Marland Kitchen... Take 1 tablet by mouth once a day    Lisinopril 40 Mg Tabs (Lisinopril) .Marland Kitchen... 1 by mouth once daily    Toprol Xl 100 Mg Xr24h-tab (Metoprolol succinate) .Marland Kitchen... 1/2 tab q daily  Labs Reviewed: Chol: 164 (12/04/2009)   HDL: 39.90 (12/04/2009)   LDL: 103 (12/04/2009)   TG: 107.0 (12/04/2009)  Problem # 3:  HYPERTENSION (ICD-401.9) controlled continue current medications  His updated medication list for this problem includes:    Thalitone 15 Mg Tabs (Chlorthalidone) .Marland Kitchen... Take 1 tablet by mouth once a day    Lisinopril 40 Mg Tabs (Lisinopril) .Marland Kitchen... 1 by mouth once daily    Toprol Xl 100 Mg Xr24h-tab (Metoprolol succinate) .Marland Kitchen... 1/2 tab q daily  BP today: 120/62 Prior BP: 112/68 (10/31/2009)  Labs Reviewed: K+: 5.0 (12/04/2009) Creat: :  1.6 (12/04/2009)   Chol: 164 (12/04/2009)   HDL: 39.90 (12/04/2009)   LDL: 103 (12/04/2009)   TG: 107.0 (12/04/2009)  Problem # 4:  HYPERLIPIDEMIA (ICD-272.4) controlled continue current medications  His updated medication list for this problem includes:    Simvastatin 40 Mg Tabs (Simvastatin) .Marland Kitchen... Take one tablet at bedtime  Labs Reviewed: SGOT: 18 (12/04/2009)   SGPT: 15 (12/04/2009)   HDL:39.90 (12/04/2009), 40.60 (08/07/2009)  LDL:103 (12/04/2009), 79 (16/03/9603)  Chol:164 (12/04/2009), 131 (08/07/2009)  Trig:107.0 (12/04/2009), 57.0 (08/07/2009)  Problem # 5:  LATERAL EPICONDYLITIS, RIGHT (ICD-726.32) by hx several months of lateral elbow pain avoid NSAIDs ice after activity  Complete Medication List: 1)  Amaryl 1 Mg Tabs (Glimepiride) .Marland Kitchen.. 1 once daily 2)  Aspir-81 81 Mg Tbec (  Aspirin) .... Take 1 tablet by mouth once a day 3)  Thalitone 15 Mg Tabs (Chlorthalidone) .... Take 1 tablet by mouth once a day 4)  Lisinopril 40 Mg Tabs (Lisinopril) .Marland Kitchen.. 1 by mouth once daily 5)  Simvastatin 40 Mg Tabs (Simvastatin) .... Take one tablet at bedtime 6)  Actos 30 Mg Tabs (Pioglitazone hcl) .... 1/2 tablet q daily 7)  Toprol Xl 100 Mg Xr24h-tab (Metoprolol succinate) .... 1/2 tab q daily 8)  Novolin 70/30 70-30 % Susp (Insulin isophane & regular) .... 20 units two times a day  please supply 30 unit syringes with needles #100/3 9)  Bd Insulin Syringe Ultrafine 31g X 5/16" 0.3 Ml Misc (Insulin syringe-needle u-100) .... Use as directed 10)  Align Caps (Probiotic product) .... Two times a day tab 11)  Colcrys 0.6 Mg Tabs (Colchicine) .... Take one tab three times a day for 2 days , one tab once daily for 2 days, then stop 12)  Cyclobenzaprine Hcl 10 Mg Tabs (Cyclobenzaprine hcl) .Marland Kitchen.. 1 by mouth 2 times daily as needed for back pain  Patient Instructions: 1)  Please schedule a follow-up appointment in 4 months. 2)  labs one week prior to visit 3)  lipids---272.4 4)  lfts-995.2 5)   bmet-995.2 6)  A1C-250.02 7)

## 2010-08-01 NOTE — Letter (Signed)
Summary: [facility]   Imported By: Maryln Gottron 10/02/2009 13:23:02  _____________________________________________________________________  External Attachment:    Type:   Image     Comment:   External Document

## 2010-08-01 NOTE — Medication Information (Signed)
Summary: Order for Diabetic Testing Supplies  Order for Diabetic Testing Supplies   Imported By: Maryln Gottron 12/05/2009 10:58:26  _____________________________________________________________________  External Attachment:    Type:   Image     Comment:   External Document

## 2010-08-01 NOTE — Assessment & Plan Note (Signed)
Summary: 4 month rov/njr   Vital Signs:  Patient profile:   70 year old Matthew [name] Weight:      200 pounds BMI:     27.22 Temp:     98.1 degrees F Pulse rate:   52 / minute Resp:     12 per minute BP sitting:   156 / 72  (left arm) Cuff size:   regular  Vitals Entered By: Gladis Riffle, RN (August 13, 2009 9:28 AM) CC: 4 month rov, labs done Is Patient Diabetic? Yes Did you bring your meter with you today? No Comments CBGs average 108 at home   CC:  4 month rov and labs done.  History of Present Illness:  Follow-Up Visit      This is a 70 year old man who presents for Follow-up visit.  The patient denies chest pain and palpitations.  Since the last visit the patient notes no new problems or concerns.  The patient reports taking meds as prescribed.  When questioned about possible medication side effects, the patient notes none.   home bps---not checked All other systems reviewed and were negative   Preventive Screening-Counseling & Management  Alcohol-Tobacco     Smoking Status: quit > 6 months     Year Started: 1981     Year Quit: 1985  Current Problems (verified): 1)  Renal Failure, Chronic  (ICD-585.9) 2)  Claudication  (ICD-443.9) 3)  Hypertension  (ICD-401.9) 4)  Hyperlipidemia  (ICD-272.4) 5)  Gout  (ICD-274.9) 6)  Diabetes Mellitus, Type II  (ICD-250.00) 7)  Coronary Artery Disease  (ICD-414.00)  Medications Prior to Update: 1)  Amaryl 1 Mg Tabs (Glimepiride) .Marland Kitchen.. 1 Once Daily 2)  Aspir-81 81 Mg Tbec (Aspirin) .... Take 1 Tablet By Mouth Once A Day 3)  Thalitone 15 Mg  Tabs (Chlorthalidone) .... Take 1 Tablet By Mouth Once A Day 4)  Zestril 20 Mg Tabs (Lisinopril) .... Take 1 Tablet By Mouth Once A Day 5)  Simvastatin 40 Mg Tabs (Simvastatin) .... Take One Tablet At Bedtime 6)  Actos 30 Mg Tabs (Pioglitazone Hcl) .... 1/2 Tablet Q Daily 7)  Toprol Xl 100 Mg Xr24h-Tab (Metoprolol Succinate) .... 1/2 Tab Q Daily 8)  Novolin 70/30 70-30 % Susp (Insulin Isophane &  Regular) .... 20 Units Two Times A Day  Please Supply 30 Unit Syringes With Needles #100/3 9)  Bd Insulin Syringe Ultrafine 31g X 5/16" 0.3 Ml Misc (Insulin Syringe-Needle U-100) .... Use As Directed 10)  Ibuprofen 800 Mg Tabs (Ibuprofen) .... One Every Eight Hours As Needed Pain  Current Medications (verified): 1)  Amaryl 1 Mg Tabs (Glimepiride) .Marland Kitchen.. 1 Once Daily 2)  Aspir-81 81 Mg Tbec (Aspirin) .... Take 1 Tablet By Mouth Once A Day 3)  Thalitone 15 Mg  Tabs (Chlorthalidone) .... Take 1 Tablet By Mouth Once A Day 4)  Zestril 20 Mg Tabs (Lisinopril) .... Take 1 Tablet By Mouth Once A Day 5)  Simvastatin 40 Mg Tabs (Simvastatin) .... Take One Tablet At Bedtime 6)  Actos 30 Mg Tabs (Pioglitazone Hcl) .... 1/2 Tablet Q Daily 7)  Toprol Xl 100 Mg Xr24h-Tab (Metoprolol Succinate) .... 1/2 Tab Q Daily 8)  Novolin 70/30 70-30 % Susp (Insulin Isophane & Regular) .... 20 Units Two Times A Day  Please Supply 30 Unit Syringes With Needles #100/3 9)  Bd Insulin Syringe Ultrafine 31g X 5/16" 0.3 Ml Misc (Insulin Syringe-Needle U-100) .... Use As Directed 10)  Ibuprofen 800 Mg Tabs (Ibuprofen) .... One Every Eight  Hours As Needed Pain  Allergies: 1)  Penicillin G Potassium (Penicillin G Potassium) 2)  Tetracycline Hcl (Tetracycline Hcl) 3)  Zocor (Simvastatin)  Past History:  Past Medical History: Last updated: 10/13/2008 Coronary artery disease 1997, mild MI, abdominal pain Diabetes mellitus, type II 1997 Gout Hyperlipidemia Hypertension age 27 Duodenal ulcer Erosive gastritis Allergies Hiatal hernia GERD Hemorrhoids  Past Surgical History: Last updated: 12/28/2006 Coronary artery bypass graft 1997  Family History: Last updated: 01/26/2007 Family History Diabetes 1st degree relative-mother Family History Hypertension Family History of Stroke M 1st degree relative  father  Social History: Last updated: 01/26/2007  Never Smoked Regular exercise-no Retired Married  Risk  Factors: Exercise: no (01/26/2007)  Risk Factors: Smoking Status: quit > 6 months (08/13/2009)  Review of Systems       All other systems reviewed and were negative -- complains of increase flatulence  Physical Exam  General:  Well-developed,well-nourished,in no acute distress; alert,appropriate and cooperative throughout examination Head:  normocephalic and atraumatic.   Eyes:  pupils equal and pupils round.   Abdomen:  soft and non-tender.   Msk:  No deformity or scoliosis noted of thoracic or lumbar spine.   Pulses:  R radial normal and L radial normal.   Neurologic:  cranial nerves II-XII intact and gait normal.   Skin:  turgor normal and color normal.   Psych:  normally interactive and good eye contact.    Diabetes Management Exam:    Eye Exam:       Eye Exam done elsewhere          Date: 06/30/2009          Results: normal          Done by: ophthal   Impression & Recommendations:  Problem # 1:  HYPERTENSION (ICD-401.9)  not controlled adust meds His updated medication list for this problem includes:    Thalitone 15 Mg Tabs (Chlorthalidone) .Marland Kitchen... Take 1 tablet by mouth once a day    Lisinopril 40 Mg Tabs (Lisinopril) .Marland Kitchen... 1 by mouth once daily    Toprol Xl 100 Mg Xr24h-tab (Metoprolol succinate) .Marland Kitchen... 1/2 tab q daily  BP today: 156/72 Prior BP: 152/70 (05/03/2009)  Labs Reviewed: K+: 5.2 (08/07/2009) Creat: : 1.6 (08/07/2009)   Chol: 131 (08/07/2009)   HDL: 40.60 (08/07/2009)   LDL: 79 (08/07/2009)   TG: 57.0 (08/07/2009)  Problem # 2:  DIABETES MELLITUS, TYPE II (ICD-250.00) improved continue current medications  weight loss encouragedc His updated medication list for this problem includes:    Amaryl 1 Mg Tabs (Glimepiride) .Marland Kitchen... 1 once daily    Aspir-81 81 Mg Tbec (Aspirin) .Marland Kitchen... Take 1 tablet by mouth once a day    Lisinopril 40 Mg Tabs (Lisinopril) .Marland Kitchen... 1 by mouth once daily    Actos 30 Mg Tabs (Pioglitazone hcl) .Marland Kitchen... 1/2 tablet q daily    Novolin  70/30 70-30 % Susp (Insulin isophane & regular) .Marland Kitchen... 20 units two times a day  please supply 30 unit syringes with needles #100/3  Labs Reviewed: Creat: 1.6 (08/07/2009)     Last Eye Exam: normal (06/30/2009) Reviewed HgBA1c results: 7.5 (08/07/2009)  8.1 (04/26/2009)  Problem # 3:  HYPERLIPIDEMIA (ICD-272.4) congtrolled continue current medications  His updated medication list for this problem includes:    Simvastatin 40 Mg Tabs (Simvastatin) .Marland Kitchen... Take one tablet at bedtime  Labs Reviewed: SGOT: 21 (08/07/2009)   SGPT: 16 (08/07/2009)   HDL:40.60 (08/07/2009), 39.70 (04/26/2009)  LDL:79 (08/07/2009), 109 (24/40/1027)  Chol:131 (08/07/2009), 161 (  04/26/2009)  Trig:57.0 (08/07/2009), 60.0 (04/26/2009)  Complete Medication List: 1)  Amaryl 1 Mg Tabs (Glimepiride) .Marland Kitchen.. 1 once daily 2)  Aspir-81 81 Mg Tbec (Aspirin) .... Take 1 tablet by mouth once a day 3)  Thalitone 15 Mg Tabs (Chlorthalidone) .... Take 1 tablet by mouth once a day 4)  Lisinopril 40 Mg Tabs (Lisinopril) .Marland Kitchen.. 1 by mouth once daily 5)  Simvastatin 40 Mg Tabs (Simvastatin) .... Take one tablet at bedtime 6)  Actos 30 Mg Tabs (Pioglitazone hcl) .... 1/2 tablet q daily 7)  Toprol Xl 100 Mg Xr24h-tab (Metoprolol succinate) .... 1/2 tab q daily 8)  Novolin 70/30 70-30 % Susp (Insulin isophane & regular) .... 20 units two times a day  please supply 30 unit syringes with needles #100/3 9)  Bd Insulin Syringe Ultrafine 31g X 5/16" 0.3 Ml Misc (Insulin syringe-needle u-100) .... Use as directed 10)  Ibuprofen 800 Mg Tabs (Ibuprofen) .... One every eight hours as needed pain 11)  Align Caps (Probiotic product) .... Two times a day tab  Patient Instructions: 1)  Please schedule a follow-up appointment in 4 months. 2)  labs one week prior to visit 3)  lipids---272.4 4)  lfts-995.2 5)  bmet-995.2 6)  A1C-250.02 7)     Prescriptions: LISINOPRIL 40 MG TABS (LISINOPRIL) 1 by mouth once daily  #90 x 3   Entered and Authorized  by:   Birdie Sons MD   Signed by:   Birdie Sons MD on 08/13/2009   Method used:   Electronically to        [facility] [phone]* (retail)       [address]       [place], [place]  [address]       Ph: [phone]       Fax: [phone]   RxID:   [id number]

## 2010-08-01 NOTE — Progress Notes (Signed)
Summary: Refill Request  Phone Note Refill Request Message from:  Patient  Refills Requested: Medication #1:  NOVOLIN 70/30 70-30 % SUSP 20 units two times a day  please supply 30 unit syringes with needles #100/3   Dosage confirmed as above?Dosage Confirmed [facility]   Initial call taken by: Harold Barban,  August 07, 2009 9:18 AM    New/Updated Medications: NOVOLIN 70/30 70-30 % SUSP (INSULIN ISOPHANE & REGULAR) 20 units two times a day  please supply 30 unit syringes with needles #100/3 Prescriptions: NOVOLIN 70/30 70-30 % SUSP (INSULIN ISOPHANE & REGULAR) 20 units two times a day  please supply 30 unit syringes with needles #100/3  #100 x 3   Entered by:   Gladis Riffle, RN   Authorized by:   Birdie Sons MD   Signed by:   Gladis Riffle, RN on 08/07/2009   Method used:   Electronically to        [facility] [phone]* (retail)       [address]       [place], [place]  [address]       Ph: [phone]       Fax: [phone]   RxID:   [id number]

## 2010-08-01 NOTE — Assessment & Plan Note (Signed)
Summary: stiffness in neck and in between shoulder blades/cjr   Vital Signs:  Patient profile:   70 year old male Weight:      200 pounds BMI:     27.22 Temp:     97.8 degrees F oral Pulse rate:   60 / minute Pulse rhythm:   regular Resp:     12 per minute BP sitting:   112 / 68  (left arm) Cuff size:   regular  Vitals Entered By: Gladis Riffle, RN (Oct 31, 2009 12:53 PM) CC: c/o right neck stiffness that went down shoulder and down into arm--began 4 weeks ago and partially resolved but then got worse Is Patient Diabetic? Yes Did you bring your meter with you today? No Comments CBGs elevated at home   CC:  c/o right neck stiffness that went down shoulder and down into arm--began 4 weeks ago and partially resolved but then got worse.  History of Present Illness: 4-5 week hx of neck pain. he thinks sxs started with uri type sxs. those sxs resolved. now has pain right side of neck to right arm to elbow. pain can also extend to scapula. neck feels stiff. moving neck increases pain no weakness  Preventive Screening-Counseling & Management  Alcohol-Tobacco     Smoking Status: quit > 6 months     Year Started: 1981     Year Quit: 1985  Current Problems (verified): 1)  Renal Failure, Chronic  (ICD-585.9) 2)  Claudication  (ICD-443.9) 3)  Hypertension  (ICD-401.9) 4)  Hyperlipidemia  (ICD-272.4) 5)  Gout  (ICD-274.9) 6)  Diabetes Mellitus, Type II  (ICD-250.00) 7)  Coronary Artery Disease  (ICD-414.00)  Current Medications (verified): 1)  Amaryl 1 Mg Tabs (Glimepiride) .Marland Kitchen.. 1 Once Daily 2)  Aspir-81 81 Mg Tbec (Aspirin) .... Take 1 Tablet By Mouth Once A Day 3)  Thalitone 15 Mg  Tabs (Chlorthalidone) .... Take 1 Tablet By Mouth Once A Day 4)  Lisinopril 40 Mg Tabs (Lisinopril) .Marland Kitchen.. 1 By Mouth Once Daily 5)  Simvastatin 40 Mg Tabs (Simvastatin) .... Take One Tablet At Bedtime 6)  Actos 30 Mg Tabs (Pioglitazone Hcl) .... 1/2 Tablet Q Daily 7)  Toprol Xl 100 Mg Xr24h-Tab  (Metoprolol Succinate) .... 1/2 Tab Q Daily 8)  Novolin 70/30 70-30 % Susp (Insulin Isophane & Regular) .... 20 Units Two Times A Day  Please Supply 30 Unit Syringes With Needles #100/3 9)  Bd Insulin Syringe Ultrafine 31g X 5/16" 0.3 Ml Misc (Insulin Syringe-Needle U-100) .... Use As Directed 10)  Ibuprofen 800 Mg Tabs (Ibuprofen) .... One Every Eight Hours As Needed Pain 11)  Align  Caps (Probiotic Product) .... Two Times A Day Tab 12)  Colcrys 0.6 Mg Tabs (Colchicine) .... Take One Tab Three Times A Day For 2 Days , One Tab Once Daily For 2 Days, Then Stop  Allergies: 1)  Penicillin G Potassium (Penicillin G Potassium) 2)  Tetracycline Hcl (Tetracycline Hcl) 3)  Zocor (Simvastatin)  Past History:  Past Medical History: Last updated: 10/13/2008 Coronary artery disease 1997, mild MI, abdominal pain Diabetes mellitus, type II 1997 Gout Hyperlipidemia Hypertension age 21 Duodenal ulcer Erosive gastritis Allergies Hiatal hernia GERD Hemorrhoids  Past Surgical History: Last updated: 12/28/2006 Coronary artery bypass graft 1997  Family History: Last updated: 01/26/2007 Family History Diabetes 1st degree relative-mother Family History Hypertension Family History of Stroke M 1st degree relative  father  Social History: Last updated: 01/26/2007  Never Smoked Regular exercise-no Retired Married  Risk Factors:  Exercise: no (01/26/2007)  Risk Factors: Smoking Status: quit > 6 months (10/31/2009)  Physical Exam  General:  Well-developed,well-nourished,in no acute distress; alert,appropriate and cooperative throughout examination Head:  normocephalic and atraumatic.   Neck:  No deformities, masses, or tenderness noted. Lungs:  normal respiratory effort and no intercostal retractions.   Heart:  normal rate and regular rhythm.     Impression & Recommendations:  Problem # 1:  NECK PAIN, CHRONIC (ICD-723.1)  i wonder about radiculopathy could be pmr as he has some  diffuse complaints (minimal) trial flexeril The following medications were removed from the medication list:    Ibuprofen 800 Mg Tabs (Ibuprofen) ..... One every eight hours as needed pain His updated medication list for this problem includes:    Aspir-81 81 Mg Tbec (Aspirin) .Marland Kitchen... Take 1 tablet by mouth once a day    Cyclobenzaprine Hcl 10 Mg Tabs (Cyclobenzaprine hcl) .Marland Kitchen... 1 by mouth 2 times daily as needed for back pain  Orders: Venipuncture (16109) Sedimentation Rate, non-automated (60454)  Complete Medication List: 1)  Amaryl 1 Mg Tabs (Glimepiride) .Marland Kitchen.. 1 once daily 2)  Aspir-81 81 Mg Tbec (Aspirin) .... Take 1 tablet by mouth once a day 3)  Thalitone 15 Mg Tabs (Chlorthalidone) .... Take 1 tablet by mouth once a day 4)  Lisinopril 40 Mg Tabs (Lisinopril) .Marland Kitchen.. 1 by mouth once daily 5)  Simvastatin 40 Mg Tabs (Simvastatin) .... Take one tablet at bedtime 6)  Actos 30 Mg Tabs (Pioglitazone hcl) .... 1/2 tablet q daily 7)  Toprol Xl 100 Mg Xr24h-tab (Metoprolol succinate) .... 1/2 tab q daily 8)  Novolin 70/30 70-30 % Susp (Insulin isophane & regular) .... 20 units two times a day  please supply 30 unit syringes with needles #100/3 9)  Bd Insulin Syringe Ultrafine 31g X 5/16" 0.3 Ml Misc (Insulin syringe-needle u-100) .... Use as directed 10)  Align Caps (Probiotic product) .... Two times a day tab 11)  Colcrys 0.6 Mg Tabs (Colchicine) .... Take one tab three times a day for 2 days , one tab once daily for 2 days, then stop 12)  Cyclobenzaprine Hcl 10 Mg Tabs (Cyclobenzaprine hcl) .Marland Kitchen.. 1 by mouth 2 times daily as needed for back pain Prescriptions: CYCLOBENZAPRINE HCL 10 MG  TABS (CYCLOBENZAPRINE HCL) 1 by mouth 2 times daily as needed for back pain  #30 x 0   Entered and Authorized by:   Birdie Sons MD   Signed by:   Birdie Sons MD on 10/31/2009   Method used:   Electronically to        [phone] (retail)       [address]       [place], [place]  [id number]       Ph: [phone] or [phone]       Fax: [phone]   RxID:   [id number]   Appended Document: stiffness in neck and in between shoulder blades/cjr  Laboratory Results   Blood Tests     SED rate: 12 mm/hr  Comments: Rita Ohara  Oct 31, 2009 3:02 PM

## 2010-08-01 NOTE — Letter (Signed)
Summary: ACCORD Study Result/[facility]  ACCORD Study Result/[facility]   Imported By: Maryln Gottron 11/22/2009 11:09:07  _____________________________________________________________________  External Attachment:    Type:   Image     Comment:   External Document

## 2010-08-01 NOTE — Medication Information (Signed)
Summary: Clarification of Metoprolol Dosage  Clarification of Metoprolol Dosage   Imported By: Maryln Gottron 05/14/2010 15:35:49  _____________________________________________________________________  External Attachment:    Type:   Image     Comment:   External Document

## 2010-08-01 NOTE — Assessment & Plan Note (Signed)
Summary: 4 month rov/njr--Rm 13   Vital Signs:  Patient profile:   70 year old male Height:      72 inches Weight:      199 pounds BMI:     27.09 Temp:     98.5 degrees F oral Pulse rate:   60 / minute Pulse rhythm:   regular Resp:     16 per minute BP sitting:   140 / 70  (left arm) Cuff size:   regular  Vitals Entered By: Mervin Kung CMA Duncan Dull) (April 10, 2010 8:28 AM) CC: Rm 13  4 month f/u. Pt has dry patch on scalp behind left ear and white area on top of head that he would like assessed. Is Patient Diabetic? No Pain Assessment Patient in pain? no      Comments Pt states all meds and doses correct. Nicki Guadalajara Fergerson CMA Duncan Dull)  April 10, 2010 8:34 AM    CC:  Rm 13  4 month f/u. Pt has dry patch on scalp behind left ear and white area on top of head that he would like assessed.Marland Kitchen  History of Present Illness:  Follow-Up Visit      This is a 70 year old man who presents for Follow-up visit.  The patient denies chest pain and palpitations.  Since the last visit the patient notes no new problems or concerns.  The patient reports taking meds as prescribed.  When questioned about possible medication side effects, the patient notes none.  home cbgs 67- 140, no sxs  All other systems reviewed and were negative   Allergies: 1)  Penicillin G Potassium (Penicillin G Potassium) 2)  Tetracycline Hcl (Tetracycline Hcl) 3)  Zocor (Simvastatin)  Past History:  Past Medical History: Last updated: 10/13/2008 Coronary artery disease 1997, mild MI, abdominal pain Diabetes mellitus, type II 1997 Gout Hyperlipidemia Hypertension age 71 Duodenal ulcer Erosive gastritis Allergies Hiatal hernia GERD Hemorrhoids  Past Surgical History: Last updated: 12/28/2006 Coronary artery bypass graft 1997  Family History: Last updated: 01/26/2007 Family History Diabetes 1st degree relative-mother Family History Hypertension Family History of Stroke M 1st degree relative   father  Social History: Last updated: 01/26/2007  Never Smoked Regular exercise-no Retired Married  Risk Factors: Exercise: no (01/26/2007)  Risk Factors: Smoking Status: quit > 6 months (12/11/2009)  Physical Exam  General:  alert and well-developed.   Head:  normocephalic and atraumatic.   Eyes:  pupils equal and pupils round.   Ears:  R ear normal and L ear normal.   Neck:  No deformities, masses, or tenderness noted. Chest Wall:  No deformities, masses, tenderness or gynecomastia noted. Heart:  normal rate and regular rhythm.   Abdomen:  soft and non-tender.   Skin:  turgor normal and color normal.   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     Impression & Recommendations:  Problem # 1:  HYPERTENSION (ICD-401.9) All other systems reviewed and were negative  His updated medication list for this problem includes:    Thalitone 15 Mg Tabs (Chlorthalidone) .Marland Kitchen... Take 1 tablet by mouth once a day    Lisinopril 40 Mg Tabs (Lisinopril) .Marland Kitchen... 1 by mouth once daily    Toprol Xl 100 Mg Xr24h-tab (Metoprolol succinate) .Marland Kitchen... 1/2 tab q daily  BP today: 140/70 Prior BP: 120/62 (12/11/2009)  Labs Reviewed: K+: 4.6 (04/03/2010) Creat: : 1.5 (04/03/2010)   Chol: 153 (04/03/2010)   HDL: 46.90 (04/03/2010)   LDL: 92 (04/03/2010)  TG: 71.0 (04/03/2010)  Problem # 2:  HYPERLIPIDEMIA (ICD-272.4) controlled continue current medications  His updated medication list for this problem includes:    Simvastatin 40 Mg Tabs (Simvastatin) .Marland Kitchen... Take one tablet at bedtime  Labs Reviewed: SGOT: 19 (04/03/2010)   SGPT: 12 (04/03/2010)   HDL:46.90 (04/03/2010), 39.90 (12/04/2009)  LDL:92 (04/03/2010), 103 (62/13/0865)  Chol:153 (04/03/2010), 164 (12/04/2009)  Trig:71.0 (04/03/2010), 107.0 (12/04/2009)  Problem # 3:  DIABETES MELLITUS, TYPE II (ICD-250.00) advised to check cbgs at diferent times of day. His updated medication list for this  problem includes:    Amaryl 1 Mg Tabs (Glimepiride) .Marland Kitchen... 1 once daily    Aspir-81 81 Mg Tbec (Aspirin) .Marland Kitchen... Take 1 tablet by mouth once a day    Lisinopril 40 Mg Tabs (Lisinopril) .Marland Kitchen... 1 by mouth once daily    Actos 30 Mg Tabs (Pioglitazone hcl) .Marland Kitchen... 1/2 tablet q daily    Novolin 70/30 70-30 % Susp (Insulin isophane & regular) .Marland Kitchen... 20 units two times a day  please supply 30 unit syringes with needles #100/3  Labs Reviewed: Creat: 1.5 (04/03/2010)     Last Eye Exam: normal (06/30/2009) Reviewed HgBA1c results: 8.1 (04/03/2010)  8.1 (12/04/2009)  Problem # 4:  GOUT (ICD-274.9) no recurrence His updated medication list for this problem includes:    Colcrys 0.6 Mg Tabs (Colchicine) .Marland Kitchen... Take one tab three times a day for 2 days , one tab once daily for 2 days, then stop  Problem # 5:  LATERAL EPICONDYLITIS, RIGHT (ICD-726.32) NOT improving  SCHEDULE injection  Complete Medication List: 1)  Amaryl 1 Mg Tabs (Glimepiride) .Marland Kitchen.. 1 once daily 2)  Aspir-81 81 Mg Tbec (Aspirin) .... Take 1 tablet by mouth once a day 3)  Thalitone 15 Mg Tabs (Chlorthalidone) .... Take 1 tablet by mouth once a day 4)  Lisinopril 40 Mg Tabs (Lisinopril) .Marland Kitchen.. 1 by mouth once daily 5)  Simvastatin 40 Mg Tabs (Simvastatin) .... Take one tablet at bedtime 6)  Actos 30 Mg Tabs (Pioglitazone hcl) .... 1/2 tablet q daily 7)  Toprol Xl 100 Mg Xr24h-tab (Metoprolol succinate) .... 1/2 tab q daily 8)  Novolin 70/30 70-30 % Susp (Insulin isophane & regular) .... 20 units two times a day  please supply 30 unit syringes with needles #100/3 9)  Bd Insulin Syringe Ultrafine 31g X 5/16" 0.3 Ml Misc (Insulin syringe-needle u-100) .... Use as directed 10)  Align Caps (Probiotic product) .... Two times a day tab 11)  Colcrys 0.6 Mg Tabs (Colchicine) .... Take one tab three times a day for 2 days , one tab once daily for 2 days, then stop 12)  Cyclobenzaprine Hcl 10 Mg Tabs (Cyclobenzaprine hcl) .Marland Kitchen.. 1 by mouth 2 times daily  as needed for back pain  Patient Instructions: 1)  Please schedule a follow-up appointment in 4 months. 2)  labs one week prior to visit 3)  lipids---272.4 4)  lfts-995.2 5)  bmet-995.2 6)  A1C-250.02 7)     8)  SCHEDULE JOINT INJE CTION RIGHT ELBOW NEXT 1-2 WEEKS  Current Allergies (reviewed today): PENICILLIN G POTASSIUM (PENICILLIN G POTASSIUM) TETRACYCLINE HCL (TETRACYCLINE HCL) ZOCOR (SIMVASTATIN)

## 2010-08-02 ENCOUNTER — Other Ambulatory Visit: Payer: Self-pay

## 2010-08-02 ENCOUNTER — Ambulatory Visit: Admit: 2010-08-02 | Payer: Self-pay | Admitting: Internal Medicine

## 2010-08-02 ENCOUNTER — Encounter (INDEPENDENT_AMBULATORY_CARE_PROVIDER_SITE_OTHER): Payer: Self-pay | Admitting: *Deleted

## 2010-08-02 ENCOUNTER — Other Ambulatory Visit: Payer: Self-pay | Admitting: Internal Medicine

## 2010-08-02 DIAGNOSIS — T887XXA Unspecified adverse effect of drug or medicament, initial encounter: Secondary | ICD-10-CM

## 2010-08-02 DIAGNOSIS — E785 Hyperlipidemia, unspecified: Secondary | ICD-10-CM

## 2010-08-02 DIAGNOSIS — E119 Type 2 diabetes mellitus without complications: Secondary | ICD-10-CM

## 2010-08-02 LAB — HEPATIC FUNCTION PANEL
ALT: 16 U/L (ref 0–53)
AST: 21 U/L (ref 0–37)
Bilirubin, Direct: 0.1 mg/dL (ref 0.0–0.3)
Total Bilirubin: 0.6 mg/dL (ref 0.3–1.2)

## 2010-08-02 LAB — BASIC METABOLIC PANEL
BUN: 23 mg/dL (ref 6–23)
Calcium: 9.2 mg/dL (ref 8.4–10.5)
GFR: 59.41 mL/min — ABNORMAL LOW (ref >60.00)
Glucose, Bld: 91 mg/dL (ref 70–99)

## 2010-08-13 ENCOUNTER — Ambulatory Visit (INDEPENDENT_AMBULATORY_CARE_PROVIDER_SITE_OTHER): Payer: Medicare Other | Admitting: Internal Medicine

## 2010-08-13 ENCOUNTER — Encounter: Payer: Self-pay | Admitting: Internal Medicine

## 2010-08-13 DIAGNOSIS — I10 Essential (primary) hypertension: Secondary | ICD-10-CM

## 2010-08-13 DIAGNOSIS — N529 Male erectile dysfunction, unspecified: Secondary | ICD-10-CM

## 2010-08-13 DIAGNOSIS — E119 Type 2 diabetes mellitus without complications: Secondary | ICD-10-CM

## 2010-08-13 DIAGNOSIS — Z2911 Encounter for prophylactic immunotherapy for respiratory syncytial virus (RSV): Secondary | ICD-10-CM

## 2010-08-13 DIAGNOSIS — E785 Hyperlipidemia, unspecified: Secondary | ICD-10-CM

## 2010-08-13 DIAGNOSIS — Z0000 Encounter for general adult medical examination without abnormal findings: Secondary | ICD-10-CM

## 2010-08-13 DIAGNOSIS — M109 Gout, unspecified: Secondary | ICD-10-CM

## 2010-08-13 DIAGNOSIS — I2510 Atherosclerotic heart disease of native coronary artery without angina pectoris: Secondary | ICD-10-CM

## 2010-08-13 MED ORDER — FLUOCINOLONE ACETONIDE 0.01 % EX CREA: TOPICAL_CREAM | Freq: Two times a day (BID) | CUTANEOUS | Status: DC

## 2010-08-13 NOTE — Assessment & Plan Note (Signed)
Patient is interested in a vacuum pump. He's had multiple trials of medications and has seen urology for penile injections.  vacuum pump. Prescription given.

## 2010-08-13 NOTE — Assessment & Plan Note (Signed)
Patient has no symptoms. Continue current medications. Risk factor modification discussed.

## 2010-08-13 NOTE — Assessment & Plan Note (Signed)
Adequate control. Continue current medications. Reviewed labs.

## 2010-08-13 NOTE — Assessment & Plan Note (Signed)
Some better control. I will not change medications. I think he needs to work on diet and exercise. Also needs to work on weight loss. Discussed low calorie diet, daily exercise.

## 2010-08-13 NOTE — Progress Notes (Signed)
  Subjective:    Patient ID: Matthew [name], male    DOB: [birth year], 70 y.o.   MRN: [id number]  HPI   patient comes in for followup of multiple medical problems including type 2 diabetes, hyperlipidemia, hypertension. The patient does not check his one sugar blood pressure at home. The patetient does not follow an exercise or diet program. The patient denies any polyuria, polydipsia.  In the past the patient has gone to diabetic treatment center. The patient is tolerating medications  Without difficulty. The patient does admit 2 medication compliance.  Past Medical History  Diagnosis Date  . DIABETES MELLITUS, TYPE II 12/28/2006  . HYPERLIPIDEMIA 12/28/2006  . GOUT 12/28/2006  . HYPERTENSION 12/28/2006  . CORONARY ARTERY DISEASE 12/28/2006  . CLAUDICATION 05/14/2007  . RENAL FAILURE, CHRONIC 09/15/2008  . LATERAL EPICONDYLITIS, RIGHT 12/11/2009   Past Surgical History  Procedure Date  . Coronary artery bypass graft     reports that he has never smoked. He does not have any smokeless tobacco history on file. He reports that he drinks about .6 ounces of alcohol per week. He reports that he does not use illicit drugs. family history includes Diabetes in his mother; Hypertension in his mother; and Stroke in his father.      Review of Systems  patient denies chest pain, shortness of breath, orthopnea. Denies lower extremity edema, abdominal pain, change in appetite, change in bowel movements. Patient denies rashes, musculoskeletal complaints. No other specific complaints in a complete review of systems. Occasionally has some left knee pain (typically after physical activity)     Objective:   Physical Exam  well-developed well-nourished male in no acute distress. HEENT exam atraumatic, normocephalic, neck supple without jugular venous distention. Chest clear to auscultation cardiac exam S1-S2 are regular. Abdominal exam overweight with bowel sounds, soft and nontender. Extremities no edema.  Neurologic exam is alert with a normal gait.        Assessment & Plan:

## 2010-09-02 ENCOUNTER — Other Ambulatory Visit: Payer: Self-pay | Admitting: Internal Medicine

## 2010-09-03 ENCOUNTER — Telehealth: Payer: Self-pay | Admitting: Internal Medicine

## 2010-09-03 NOTE — Telephone Encounter (Signed)
PT CALLED TO ADV THAT HE NEEDS A REFILL ON METROPOLOL (TOPROL)..... [facility]  DRUG # [id number]..... MEMBER ID # [id number]. ..... RX SHOULD INCLUDE PTS NAME, DOB, AND ADDRESS.... 90-DAY SUPPLY.

## 2010-09-25 ENCOUNTER — Telehealth (INDEPENDENT_AMBULATORY_CARE_PROVIDER_SITE_OTHER): Payer: Medicare Other | Admitting: Internal Medicine

## 2010-09-25 DIAGNOSIS — I10 Essential (primary) hypertension: Secondary | ICD-10-CM

## 2010-09-25 MED ORDER — METOPROLOL SUCCINATE ER 100 MG PO TB24: 100.0000 mg | ORAL_TABLET | Freq: Every day | ORAL | Status: DC

## 2010-09-25 NOTE — Telephone Encounter (Signed)
rx sent in electronically 

## 2010-09-25 NOTE — Telephone Encounter (Signed)
Refill generic Toprol 100mg  for mail order. Please send to [facility].

## 2010-10-14 ENCOUNTER — Telehealth (INDEPENDENT_AMBULATORY_CARE_PROVIDER_SITE_OTHER): Payer: Medicare Other | Admitting: Internal Medicine

## 2010-10-14 DIAGNOSIS — M109 Gout, unspecified: Secondary | ICD-10-CM

## 2010-10-14 NOTE — Telephone Encounter (Signed)
Refill Gliperide 1mg    and his gout medication. Send new rxs to [facility]---ph-[phone].  He was using a different pharmacy. Wants Cindy to call him.

## 2010-10-15 MED ORDER — COLCHICINE 0.6 MG PO TABS: 0.6000 mg | ORAL_TABLET | Freq: Every day | ORAL | Status: DC | PRN

## 2010-10-15 MED ORDER — GLIMEPIRIDE 1 MG PO TABS: 1.0000 mg | ORAL_TABLET | Freq: Every day | ORAL | Status: DC

## 2010-10-28 ENCOUNTER — Encounter: Payer: Medicare Other | Admitting: Cardiovascular Disease

## 2010-11-07 ENCOUNTER — Ambulatory Visit (AMBULATORY_SURGERY_CENTER): Payer: Medicare Other | Admitting: *Deleted

## 2010-11-07 VITALS — Ht 72.0 in | Wt 204.8 lb

## 2010-11-07 DIAGNOSIS — Z1211 Encounter for screening for malignant neoplasm of colon: Secondary | ICD-10-CM

## 2010-11-07 MED ORDER — PEG-KCL-NACL-NASULF-NA ASC-C 100 G PO SOLR: ORAL | Status: DC

## 2010-11-10 ENCOUNTER — Other Ambulatory Visit: Payer: Self-pay | Admitting: Internal Medicine

## 2010-11-12 NOTE — Progress Notes (Signed)
[facility]                        PERIPHERAL VASCULAR OFFICE NOTE   KEYLIN, PODOLSKY                     MRN:          [id number]  DATE:09/06/2007                            DOB:          10/15/1940    Dustine Stickler was seen in follow-up at the [facility] Peripheral Vascular  Office on September 06, 2007.  Mr. Rotert is a very nice 70 year old  gentleman with coronary and peripheral arterial disease.  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  He  was seen back in 2003 by Dr. Chales Abrahams and has been managed medically since  that time.  I saw Mr. Arocho in December of last year for new onset  pain in the left hip and thigh region.  He underwent a lower extremity  duplex scan at that time that showed ABIs of 0.73 on the right and 0.72  on the left with some suggestion of inflow disease as well.  That study  was followed up with a CT angiogram to evaluate the aortoiliac region.  This study showed a 40-50% stenosis in the left common iliac artery but  otherwise no significant stenoses bilaterally. Otherwise no significant  proximal stenoses bilaterally.   From a symptomatic standpoint, Mr. Victory Dakin is stable.  He continues to  walk on the treadmill for approximately one and a half miles without  needing to stop.  He was unable to tolerate Pletal due to severe GI side  effects.  He has no rest pain or progressive symptoms in the legs.   PREMEDICATION:  1,  Amaryl 1 mg b.i.d.  1. Aspirin 81 mg daily.  2. Byetta 10 mcg b.i.d.  3. Zestril 20 mg daily.  4. Lipitor 40 mg at bedtime.  5. Actos 15 mg daily.  6. Toprol XL 50 mg daily.   ALLERGIES:  PENICILLIN, TETRACYCLINE.   PHYSICAL EXAMINATION:  The patient is alert and oriented in no acute  distress.  Weight is 190, blood pressure is 132/80, heart rate 84, respiratory rate  16.  HEENT:  Normal.  NECK:  Normal carotid upstrokes without bruits, jugular venous pressure  is normal.  LUNGS:  Clear to auscultation bilaterally.  HEART:  Regular rate and rhythm without murmurs or gallops.  ABDOMEN:  Soft, nontender, no organomegaly.  No bruits.  EXTREMITIES:  Femoral pulses are 1+, popliteal, dorsalis pedis and  posterior tibial pulses are absent.  The skin is warm and dry.  The feet  are warm and appear well perfused.   ASSESSMENT:  1. Lower extremity peripheral arterial disease as outlined above.      Continue medical therapy.  Mr. Victory Dakin was encouraged regarding his      regular exercise program and he should do well with a conservative      approach as outlined.  He is on an excellent medical program under      the care of Dr. Cato Mulligan..  2. Coronary artery disease status post coronary artery bypass graft.      Continues on aspirin, ACE inhibitor, beta blocker and statin      therapy.  Blood pressure  and cholesterol managed by Dr. Cato Mulligan.   For follow-up, I would like to see Mr. Armbrust back on a yearly basis to  assure for stability. I would be happy to see him at any time if he  develops progressive symptoms.     Veverly Fells. Excell Seltzer, MD  Electronically Signed    MDC/MedQ  DD: 09/06/2007  DT: 09/07/2007  Job #: [id number]   cc:   Valetta Mole. Swords, MD

## 2010-11-12 NOTE — Letter (Signed)
June 14, 2007    Bruce H. Swords, MD  [address] [place], [place] [address]   RE:  RAMZY, CAPPELLETTI  MRN:  [id number]  /  DOB:  1940-07-07   Dear Smitty Cords,   It was my pleasure to see Abe People for evaluation of his lower  extremity peripheral arterial disease and intermittent claudication on  June 14, 2007.   HISTORY OF PRESENT ILLNESS:  Mr. Patty is a very nice 70 year old  gentleman with coronary and peripheral arterial disease.  He was seen in  our office by Dr. Chales Abrahams back in 2003.  At that time it was decided to  treat him medically for his peripheral arterial disease and he did not  undergo any invasive therapies.   Mr. Yeagley has a long history of bilateral calf claudication.  He  describes 8-10 years of calf pain with walking, approximately 3/4 of a  mile.  He develops symptoms more rapidly when walking up hills.  This  symptom is stable and unchanged.  It really does not bother him much, he  does not experience calf pain on a day to day basis.  However, he has  developed left hip and thigh pain with lower level activities such as  walking 100 feet.  This change has occurred in the last 6-8 weeks.  He  recently underwent a lower extremity arterial duplex scan that showed  ABIs of 0.73 on the right and 0.72 on the left.  This study was also  suggestive of significant bilateral inflow disease involving the  aortoiliac region.   PAST MEDICAL HISTORY:  1. Coronary artery disease status post CABG in 1997.  2. Peripheral arterial disease as outlined above.  3. Type 2 diabetes.  4. Hyperlipidemia.  5. Hiatal hernia with associated gastroesophageal reflux disease.   CURRENT MEDICATIONS:  1. Amaryl 1 mg twice daily.  2. Aspirin 81 mg daily.  3. Byetta 10 mcg b.i.d.  4. __________  15 mg daily.  5. Zestril 20 mg daily.  6. Lipitor 40 mg at bedtime.  7. Actos 15 mg daily.   ALLERGIES:  PENICILLIN, TETRACYCLINE.   SOCIAL HISTORY:  The patient is  married, he has 2 children who are both  in college.  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  Mr. Laraia is active and tries to exercise regularly.  He is a  nonsmoker.  He quit smoking 4 years ago and previously has a 35 year  smoking history.  He does not drink alcohol.   FAMILY HISTORY:  His mother is alive and well, his father died at age 7  of congestive heart failure.  He has 2 siblings who have no cardiac  disease.   REVIEW OF SYSTEMS:  A complete 12 point review of systems was performed,  pertinent for occasional lightheadedness and headaches.  He has  osteoarthritis, history of ulcer disease, seasonal allergies and  fatigue.  All other systems were reviewed and are negative except as  detailed above.   EXAMINATION:  Patient is alert and oriented, he is in no acute distress.  Weight is 191, blood pressure 104/64 on the right arm, 96/64 in the  left, heart rate 72, respiratory rate 16.  HEENT:  Normal.  NECK:  Normal carotid upstrokes without bruits, jugular venous pressure  is normal, no thyromegaly or thyroid nodules.  LUNGS:  Clear to auscultation bilaterally.  HEART:  The apex is discreet and nondisplaced.  Heart has regular rate  and rhythm without murmurs or gallops.  ABDOMEN:  Soft, nontender, no organomegaly, no bruits.  EXTREMITIES:  Femoral pulses are 2+ on the right, 1+ on the left.  There  is no clubbing, cyanosis or edema.  Distal pulses in the feet are not  palpable.  The feet are warm and appear well perfused.  LYMPHATICS:  There is no adenopathy.  SKIN:  There are no ulcerations or areas of skin breakdown.  NEUROLOGIC:  Cranial nerves II-XII are intact, strength 5/5 and equal.   ASSESSMENT:  Mr. Mojica is a 70 year old gentleman with lower extremity  peripheral arterial disease with significant lifestyle limitation.  I  reviewed the findings of the noninvasive studies in detail with Mr.  Thrun.  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  His superficial femoral artery disease appears stable and he is not  limited by calf claudication at present.  He likely has developed severe  stenosis in the left iliac region as he has now developed left hip and  thigh pain that is typical of claudication.  I offered Mr. Baby an  angiogram with potential of percutaneous transluminal angioplasty.  We  reviewed the risks and rationale for this approach in detail but he  would like more conservative approach initially.  Therefore, I started  him on cilostazol 100 mg daily for 2 weeks then to increase to 100 mg  twice daily.  I encouraged him to continue walking as much as he can.  Also, I am going to have him undergo a CT angiogram of the aortoiliac  region so we can have a full anatomic assessment for further discussion  of intervention and potential planning as well.   I would like to see Mr. Pullman back in 3 months to see how he has  responded to Pletal and I will review his CT scan.  I will review his CT  angiogram in the interim.   Bruce, thanks again for allowing me to participate in the care of this  very nice gentleman, please feel free to call at any time with  questions.    Sincerely,      Veverly Fells. Excell Seltzer, MD  Electronically Signed    MDC/MedQ  DD: 06/15/2007  DT: 06/15/2007  Job #: [id number]

## 2010-11-14 ENCOUNTER — Other Ambulatory Visit: Payer: Self-pay | Admitting: *Deleted

## 2010-11-14 DIAGNOSIS — I10 Essential (primary) hypertension: Secondary | ICD-10-CM

## 2010-11-14 MED ORDER — CHLORTHALIDONE 25 MG PO TABS: 25.0000 mg | ORAL_TABLET | Freq: Every day | ORAL | Status: DC

## 2010-11-15 NOTE — Cardiovascular Report (Signed)
[place]  Patient:    Matthew [name], Matthew [name] Visit Number: [id number] MRN: [id number]          Service Type: CAT Location: [facility] [id number] Attending Physician:  Daisey Must Dictated by:   Daisey Must, M.D. [facility] Proc. Date: 07/20/01 Admit Date:  07/20/2001   CC:         Matthew Senior A. Matthew [name], M.D.  Matthew [name], M.D. [facility]  Cardiac Catheterization Lab   Cardiac Catheterization  PROCEDURES PERFORMED:  Left heart catheterization with coronary angiography, left ventriculography, bypass graft angiography, and abdominal aortography.  INDICATIONS:  [name] is a 70 year old male with a history of previous coronary bypass surgery.  He has been having somewhat atypical chest pain recently.  A stress Cardiolite scan done in the office showed mild ischemia in the lateral apical wall.  He was referred for cardiac catheterization.  PROCEDURAL NOTE:  A #6 French sheath was placed in the right femoral artery. Coronary angiography and bypass graft angiography were performed with standard Judkins #6 French catheters.  Left ventriculography and abdominal aortography were performed with an angled pigtail catheter.  Contrast was Omnipaque. There were no complications.  RESULTS:  Hemodynamics:  Left ventricular pressure 126/14.  Aortic pressure 126/72. There is no aortic valve gradient.  Left ventriculogram:  There is very mild hypokinesis of the inferoapical wall. Otherwise wall motion is normal.  Ejection fraction estimated at greater than or equal to 65%.  There is no mitral regurgitation.  Abdominal aortogram is normal.  Coronary arteriography (right dominant): 1. The left main has a mid 60-70% stenosis. 2. The left anterior descending artery has a diffuse 95% stenosis in the    proximal to mid vessel.  The first diagonal is small in size and is 100%    occluded.  The second diagonal was also 100% occluded at its origin.  These    all fill via  bypass grafts. 3. The left circumflex is 100% occluded in the mid vessel.  There are grade II    left-to-left collaterals in filling the distal circumflex which consist of    a small to normal sized second obtuse marginal branch.  There is a small    obtuse marginal branch arising from the proximal circumflex prior to its    occlusion.  This is new when compared to previous catheterization and this    artery is not bypassed. 4. The right coronary artery is 100% occluded at its origin.  The distal    vessel fills via bypass graft. 5. The left internal mammary artery is patent.  This is anastomosed    sequentially to the second diagonal branch in the distal LAD.  Both of    these anastomoses are intact and filling a normal sized second diagonal    branch in the mid and distal LAD. 6. A saphenous vein graft to the first diagonal branch is patent.  This is a    very slender graft filling a small diagonal branch.  There is diffuse    luminal irregularities in this bypass graft. 7. There is a sequential saphenous vein graft to the posterior descending    artery and a second posterolateral branch.  There is a 30% stenosis in    the proximal portion of this graft; otherwise, it is patent filling a    normal sized posterior descending artery and a normal sized posterolateral    branch as well as the AV groove portion of the right coronary artery  between these two branches.  In the AV groove portion of the right coronary    artery between the PDA and the second posterolateral branch, there is a    90% stenosis with a small first posterolateral branch arising from this    segment of vessel.  IMPRESSIONS: 1. Preserved left ventricular systolic function. 2. Native three-vessel coronary artery disease as described. 3. Status post coronary artery bypass graft surgery with all grafts being    patent. 4. There is a new occlusion of the mid left circumflex with some collateral    filling of the  distal vessel which consists of a small to normal sized    obtuse marginal branch.  PLAN:  These findings were reviewed with Dr. Juanda Chance.  Because of the relatively small vessel size of the circumflex and the chronic nature of this occlusion, it has been opted to manage the patient medically.Dictated by: Daisey Must, M.D. [facility] Attending Physician:  Daisey Must DD:  07/20/01 TD:  07/20/01 Job: [id number]

## 2010-11-21 ENCOUNTER — Encounter: Payer: Self-pay | Admitting: *Deleted

## 2010-11-21 ENCOUNTER — Other Ambulatory Visit: Payer: Medicare Other | Admitting: Gastroenterology

## 2010-11-26 ENCOUNTER — Encounter: Payer: Self-pay | Admitting: Cardiovascular Disease

## 2010-11-26 ENCOUNTER — Ambulatory Visit (INDEPENDENT_AMBULATORY_CARE_PROVIDER_SITE_OTHER): Payer: Medicare Other | Admitting: Cardiovascular Disease

## 2010-11-26 VITALS — BP 133/65 | HR 53 | Resp 18 | Ht 72.0 in | Wt 206.8 lb

## 2010-11-26 DIAGNOSIS — I10 Essential (primary) hypertension: Secondary | ICD-10-CM

## 2010-11-26 DIAGNOSIS — I2510 Atherosclerotic heart disease of native coronary artery without angina pectoris: Secondary | ICD-10-CM

## 2010-11-26 DIAGNOSIS — I739 Peripheral vascular disease, unspecified: Secondary | ICD-10-CM

## 2010-11-26 NOTE — Patient Instructions (Signed)
Your physician wants you to follow-up in: 1 YEAR.  You will receive a reminder letter in the mail two months in advance. If you don't receive a letter, please call our office to schedule the follow-up appointment.  Your physician recommends that you continue on your current medications as directed. Please refer to the Current Medication list given to you today.  

## 2010-11-26 NOTE — Progress Notes (Signed)
HPI:  This is a 70 year old gentleman presenting for preoperative evaluation. He has a history of coronary artery disease status post coronary bypass surgery in 1997. He also has peripheral arterial disease with long total occlusion of both superficial femoral arteries. The patient has been managed medically for his peripheral arterial disease he has had no progressive ischemic symptoms. He denies chest pain, chest pressure, dyspnea, edema, orthopnea, PND, palpitations, lightheadedness, or syncope. The patient tore his meniscus and is going to require surgery. He has been limited over recent weeks after his orthopedic injury but previously was able to walk approximately 1/2 mile without any exertional chest pain or dyspnea. He does admit to bilateral calf tightness with walking, stable over time. He reports compliance with his medications.  Outpatient Encounter Prescriptions as of 11/26/2010  Medication Sig Dispense Refill  . ACTOS 30 MG tablet TAKE ONE-HALF TABLET BY MOUTH EVERY DAY  90 each  3  . aspirin 81 MG tablet Take 81 mg by mouth daily.        . chlorthalidone (HYGROTON) 25 MG tablet Take 1 tablet (25 mg total) by mouth daily.  90 tablet  3  . colchicine 0.6 MG tablet Take 1 tablet (0.6 mg total) by mouth daily as needed.  8 tablet  1  . cyclobenzaprine (FLEXERIL) 10 MG tablet Take 10 mg by mouth 2 (two) times daily as needed.        Marland Kitchen glimepiride (AMARYL) 1 MG tablet Take 1 tablet (1 mg total) by mouth daily before breakfast.  30 tablet  5  . HUMULIN 70/30 70-30 % injection Inject 20 Units into the skin 2 (two) times daily with meals.       Marland Kitchen lisinopril (PRINIVIL,ZESTRIL) 40 MG tablet TAKE ONE TABLET BY MOUTH EVERY DAY  90 tablet  1  . metoprolol (TOPROL-XL) 100 MG 24 hr tablet Take 1 tablet (100 mg total) by mouth daily.  90 tablet  1  . NIFEdipine (PROCARDIA-XL) 30 MG (OSM) 24 hr tablet Take 30 mg by mouth 2 (two) times daily.        . peg 3350 powder (MOVIPREP) 100 G SOLR movi prep as  directed  1 kit  0  . simvastatin (ZOCOR) 40 MG tablet Take 40 mg by mouth at bedtime.        Marland Kitchen DISCONTD: fluocinolone (VANOS) 0.01 % cream Apply topically 2 (two) times daily.  30 g  0  . DISCONTD: Probiotic Product (ALIGN PO) Take by mouth daily.          Allergies  Allergen Reactions  . Penicillins     REACTION: urticaria (hives)  . Simvastatin     REACTION: LEG CRAMPS  . Tetracycline Hcl     REACTION: urticaria (hives)    Past Medical History  Diagnosis Date  . DIABETES MELLITUS, TYPE II 12/28/2006  . HYPERLIPIDEMIA 12/28/2006  . GOUT 12/28/2006  . HYPERTENSION 12/28/2006  . CORONARY ARTERY DISEASE 12/28/2006  . CLAUDICATION 05/14/2007  . RENAL FAILURE, CHRONIC 09/15/2008  . LATERAL EPICONDYLITIS, RIGHT 12/11/2009  . GERD (gastroesophageal reflux disease)   . Erosive gastritis   . Duodenal ulcer   . Hiatal hernia   . Hemorrhoids     ROS: Negative except as per HPI  BP 133/65  Pulse 53  Resp 18  Ht 6' (1.829 m)  Wt 206 lb 12.8 oz (93.804 kg)  BMI 28.05 kg/m2  PHYSICAL EXAM: Pt is alert and oriented, very pleasant African American male in NAD HEENT: normal Neck:  JVP - normal, carotids 2+= without bruits Lungs: CTA bilaterally CV: RRR without murmur or gallop Abd: soft, NT, Positive BS, no hepatomegaly Ext: no C/C/E Skin: warm/dry no rash  EKG:  Sinus bradycardia 53 beats per minute, otherwise within normal limits.  ASSESSMENT AND PLAN:

## 2010-11-29 ENCOUNTER — Telehealth: Payer: Self-pay | Admitting: Gastroenterology

## 2010-11-29 DIAGNOSIS — D126 Benign neoplasm of colon, unspecified: Secondary | ICD-10-CM

## 2010-11-29 HISTORY — DX: Benign neoplasm of colon, unspecified: D12.6

## 2010-12-04 ENCOUNTER — Ambulatory Visit (AMBULATORY_SURGERY_CENTER): Payer: Medicare Other | Admitting: Gastroenterology

## 2010-12-04 ENCOUNTER — Encounter: Payer: Self-pay | Admitting: Gastroenterology

## 2010-12-04 VITALS — BP 128/73 | HR 50 | Temp 97.9°F | Resp 20 | Ht 72.0 in | Wt 200.0 lb

## 2010-12-04 DIAGNOSIS — D126 Benign neoplasm of colon, unspecified: Secondary | ICD-10-CM

## 2010-12-04 DIAGNOSIS — Z1211 Encounter for screening for malignant neoplasm of colon: Secondary | ICD-10-CM

## 2010-12-04 MED ORDER — SODIUM CHLORIDE 0.9 % IV SOLN: 500.0000 mL | INTRAVENOUS | Status: DC

## 2010-12-04 NOTE — Patient Instructions (Signed)
Follow discharge instructions.  Continue your medications.  Await pathology results. 

## 2010-12-05 ENCOUNTER — Telehealth: Payer: Self-pay | Admitting: *Deleted

## 2010-12-05 NOTE — Telephone Encounter (Signed)

## 2010-12-06 ENCOUNTER — Other Ambulatory Visit: Payer: Medicare Other

## 2010-12-09 ENCOUNTER — Encounter: Payer: Self-pay | Admitting: Gastroenterology

## 2010-12-09 ENCOUNTER — Other Ambulatory Visit: Payer: Medicare Other

## 2010-12-10 ENCOUNTER — Encounter: Payer: Self-pay | Admitting: Cardiovascular Disease

## 2010-12-10 NOTE — Assessment & Plan Note (Signed)
Stable without signs or symptoms of progressive limb ischemia.

## 2010-12-10 NOTE — Assessment & Plan Note (Signed)
The patient is stable without angina. His electrocardiogram is essentially normal. He has had good functional capacity and I think he can proceed with surgery without further cardiovascular evaluation. His risk of a perioperative cardiac event is low. He has been on long-acting metoprolol long-term and should continue with this medication perioperatively. He otherwise does not require any specific perioperative instructions related to his cardiac condition.

## 2010-12-10 NOTE — Assessment & Plan Note (Signed)
Well-controlled on current medical program. 

## 2010-12-13 ENCOUNTER — Ambulatory Visit: Payer: Medicare Other | Admitting: Internal Medicine

## 2010-12-16 ENCOUNTER — Other Ambulatory Visit (INDEPENDENT_AMBULATORY_CARE_PROVIDER_SITE_OTHER): Payer: Medicare Other

## 2010-12-16 ENCOUNTER — Ambulatory Visit: Payer: Medicare Other | Admitting: Internal Medicine

## 2010-12-16 ENCOUNTER — Other Ambulatory Visit: Payer: Self-pay | Admitting: Internal Medicine

## 2010-12-16 DIAGNOSIS — I10 Essential (primary) hypertension: Secondary | ICD-10-CM

## 2010-12-16 DIAGNOSIS — E785 Hyperlipidemia, unspecified: Secondary | ICD-10-CM

## 2010-12-16 DIAGNOSIS — E119 Type 2 diabetes mellitus without complications: Secondary | ICD-10-CM

## 2010-12-16 LAB — LIPID PANEL
HDL: 41.7 mg/dL (ref >39.00)
Triglycerides: 116 mg/dL (ref 0.0–149.0)
VLDL: 23.2 mg/dL (ref 0.0–40.0)

## 2010-12-16 LAB — BASIC METABOLIC PANEL
BUN: 24 mg/dL — ABNORMAL HIGH (ref 6–23)
Chloride: 101 mEq/L (ref 96–112)
GFR: 64.76 mL/min (ref >60.00)
Glucose, Bld: 88 mg/dL (ref 70–99)
Potassium: 4.4 mEq/L (ref 3.5–5.1)
Sodium: 144 mEq/L (ref 135–145)

## 2010-12-16 LAB — HEPATIC FUNCTION PANEL
ALT: 12 U/L (ref 0–53)
Bilirubin, Direct: 0.1 mg/dL (ref 0.0–0.3)
Total Bilirubin: 0.7 mg/dL (ref 0.3–1.2)
Total Protein: 7.6 g/dL (ref 6.0–8.3)

## 2010-12-16 LAB — HEMOGLOBIN A1C: Hgb A1c MFr Bld: 8.1 % — ABNORMAL HIGH (ref 4.6–6.5)

## 2010-12-16 LAB — LDL CHOLESTEROL, DIRECT: Direct LDL: 150.3 mg/dL

## 2010-12-23 ENCOUNTER — Other Ambulatory Visit: Payer: Self-pay | Admitting: Internal Medicine

## 2010-12-24 ENCOUNTER — Encounter: Payer: Self-pay | Admitting: Internal Medicine

## 2010-12-24 ENCOUNTER — Ambulatory Visit (INDEPENDENT_AMBULATORY_CARE_PROVIDER_SITE_OTHER): Payer: Medicare Other | Admitting: Internal Medicine

## 2010-12-24 VITALS — BP 140/70 | HR 60 | Temp 98.5°F | Ht 72.0 in | Wt 203.0 lb

## 2010-12-24 DIAGNOSIS — E785 Hyperlipidemia, unspecified: Secondary | ICD-10-CM

## 2010-12-24 DIAGNOSIS — I10 Essential (primary) hypertension: Secondary | ICD-10-CM

## 2010-12-24 DIAGNOSIS — E119 Type 2 diabetes mellitus without complications: Secondary | ICD-10-CM

## 2010-12-24 MED ORDER — INSULIN NPH ISOPHANE & REGULAR (70-30) 100 UNIT/ML ~~LOC~~ SUSP: 20.0000 [IU] | Freq: Two times a day (BID) | SUBCUTANEOUS | Status: DC

## 2010-12-24 MED ORDER — ATORVASTATIN CALCIUM 40 MG PO TABS: 40.0000 mg | ORAL_TABLET | Freq: Every day | ORAL | Status: DC

## 2010-12-24 NOTE — Assessment & Plan Note (Signed)
Not as well controlled as i would like initially Repeat bps were better Continue same meds

## 2010-12-24 NOTE — Progress Notes (Signed)
  Subjective:    Patient ID: EBUBECHUKWU JEDLICKA, male    DOB: 21-Jun-1941, 70 y.o.   MRN: [id number]  HPI   patient comes in for followup of multiple medical problems including type 2 diabetes, hyperlipidemia, hypertension. The patient does not check blood sugar or blood pressure at home. The patetient does not follow an exercise or diet program. The patient denies any polyuria, polydipsia.  In the past the patient has gone to diabetic treatment center. The patient is tolerating medications  Without difficulty. The patient does admit to medication compliance.   Scheduled for surgery this Thursday (meniscal tear)  Past Medical History  Diagnosis Date  . DIABETES MELLITUS, TYPE II 12/28/2006  . HYPERLIPIDEMIA 12/28/2006  . GOUT 12/28/2006  . HYPERTENSION 12/28/2006  . CORONARY ARTERY DISEASE 12/28/2006  . CLAUDICATION 05/14/2007  . RENAL FAILURE, CHRONIC 09/15/2008  . LATERAL EPICONDYLITIS, RIGHT 12/11/2009  . GERD (gastroesophageal reflux disease)   . Erosive gastritis   . Duodenal ulcer   . Hiatal hernia   . Hemorrhoids    Past Surgical History  Procedure Date  . Coronary artery bypass graft   . Colonoscopy     reports that he has never smoked. He quit smokeless tobacco use about 22 years ago. His smokeless tobacco use included Chew. He reports that he drinks about .6 ounces of alcohol per week. He reports that he does not use illicit drugs. family history includes Diabetes in his mother; Hypertension in his mother; and Stroke in his father. Allergies  Allergen Reactions  . Penicillins     REACTION: urticaria (hives)  . Simvastatin     REACTION: LEG CRAMPS  . Tetracycline Hcl     REACTION: urticaria (hives)    Review of Systems  patient denies chest pain, shortness of breath, orthopnea. Denies lower extremity edema, abdominal pain, change in appetite, change in bowel movements. Patient denies rashes, musculoskeletal complaints. No other specific complaints in a complete review of  systems.      Objective:   Physical Exam  Well-developed well-nourished male in no acute distress. HEENT exam atraumatic, normocephalic, extraocular muscles are intact. Neck is supple. No jugular venous distention no thyromegaly. Chest clear to auscultation without increased work of breathing. Cardiac exam S1 and S2 are regular. Abdominal exam active bowel sounds, soft, nontender. Extremities no edema. Neurologic exam she is alert without any motor sensory deficits. Gait is normal.        Assessment & Plan:

## 2010-12-24 NOTE — Assessment & Plan Note (Signed)
Not as well controlled---compliance is likely part of the issue Change to lipitor

## 2010-12-24 NOTE — Assessment & Plan Note (Signed)
Not as well controlled Increase exercise and follow daily exercise regimen

## 2011-04-09 ENCOUNTER — Other Ambulatory Visit (INDEPENDENT_AMBULATORY_CARE_PROVIDER_SITE_OTHER): Payer: Medicare Other

## 2011-04-09 DIAGNOSIS — E119 Type 2 diabetes mellitus without complications: Secondary | ICD-10-CM

## 2011-04-09 LAB — HEPATIC FUNCTION PANEL
ALT: 14 U/L (ref 0–53)
Albumin: 4 g/dL (ref 3.5–5.2)
Total Bilirubin: 0.3 mg/dL (ref 0.3–1.2)

## 2011-04-09 LAB — LIPID PANEL
HDL: 41.7 mg/dL (ref >39.00)
Total CHOL/HDL Ratio: 3
VLDL: 10.4 mg/dL (ref 0.0–40.0)

## 2011-04-09 LAB — HEMOGLOBIN A1C: Hgb A1c MFr Bld: 8.1 % — ABNORMAL HIGH (ref 4.6–6.5)

## 2011-04-09 LAB — BASIC METABOLIC PANEL
BUN: 21 mg/dL (ref 6–23)
Chloride: 107 mEq/L (ref 96–112)
Glucose, Bld: 76 mg/dL (ref 70–99)
Potassium: 4.2 mEq/L (ref 3.5–5.1)

## 2011-04-18 ENCOUNTER — Other Ambulatory Visit: Payer: Self-pay | Admitting: Internal Medicine

## 2011-04-21 ENCOUNTER — Other Ambulatory Visit: Payer: Self-pay | Admitting: Internal Medicine

## 2011-04-25 ENCOUNTER — Ambulatory Visit: Payer: Medicare Other | Admitting: Internal Medicine

## 2011-05-01 ENCOUNTER — Telehealth: Payer: Self-pay | Admitting: Internal Medicine

## 2011-05-01 NOTE — Telephone Encounter (Signed)
Pt is coming to p/u a sample of the humulin 75/25 till we can get it changed

## 2011-05-01 NOTE — Telephone Encounter (Signed)
ok 

## 2011-05-01 NOTE — Telephone Encounter (Signed)
Pt is requesting a change in script on HUMULIN 70/30 (70-30) 100 UNIT/ML injection Pt said this is no longer available at his pharmacy and he out and that this needs to be taken care of today.  Please call patient

## 2011-05-02 MED ORDER — INSULIN NPH ISOPHANE & REGULAR (70-30) 100 UNIT/ML ~~LOC~~ SUSP: SUBCUTANEOUS | Status: DC

## 2011-05-02 NOTE — Telephone Encounter (Signed)
rx changed and faxed to pharmacy

## 2011-05-02 NOTE — Telephone Encounter (Signed)
Pharmacy called and wants to know if pts insulin could be changed to Novolin?

## 2011-05-05 NOTE — Telephone Encounter (Signed)
error 

## 2011-06-10 ENCOUNTER — Encounter: Payer: Self-pay | Admitting: Internal Medicine

## 2011-06-10 ENCOUNTER — Ambulatory Visit (INDEPENDENT_AMBULATORY_CARE_PROVIDER_SITE_OTHER): Payer: Medicare Other | Admitting: Internal Medicine

## 2011-06-10 VITALS — BP 132/80 | HR 80 | Temp 98.2°F | Ht 72.0 in | Wt 198.0 lb

## 2011-06-10 DIAGNOSIS — E119 Type 2 diabetes mellitus without complications: Secondary | ICD-10-CM

## 2011-06-10 DIAGNOSIS — I10 Essential (primary) hypertension: Secondary | ICD-10-CM

## 2011-06-10 MED ORDER — AZITHROMYCIN 250 MG PO TABS: ORAL_TABLET | ORAL | Status: AC

## 2011-06-10 NOTE — Assessment & Plan Note (Signed)
BP Readings from Last 3 Encounters:  06/10/11 132/80  12/24/10 140/70  12/04/10 128/73   Well controlled, continue meds

## 2011-06-10 NOTE — Assessment & Plan Note (Signed)
Lab Results  Component Value Date   HGBA1C 8.1* 04/09/2011   Not as well controlled as I would like CBGs at home low= 60, high=250

## 2011-06-10 NOTE — Progress Notes (Signed)
Patient ID: Matthew [name], male   DOB: 1940-08-21, 70 y.o.   MRN: [id number]  patient comes in for followup of multiple medical problems including type 2 diabetes, hyperlipidemia, hypertension. The patient does not check blood sugar or blood pressure at home. The patetient does not follow an exercise or diet program. The patient denies any polyuria, polydipsia.  In the past the patient has gone to diabetic treatment center. The patient is tolerating medications  Without difficulty. The patient does admit to medication compliance.   Past Medical History  Diagnosis Date  . DIABETES MELLITUS, TYPE II 12/28/2006  . HYPERLIPIDEMIA 12/28/2006  . GOUT 12/28/2006  . HYPERTENSION 12/28/2006  . CORONARY ARTERY DISEASE 12/28/2006  . CLAUDICATION 05/14/2007  . RENAL FAILURE, CHRONIC 09/15/2008  . LATERAL EPICONDYLITIS, RIGHT 12/11/2009  . GERD (gastroesophageal reflux disease)   . Erosive gastritis   . Duodenal ulcer   . Hiatal hernia   . Hemorrhoids     History   Social History  . Marital Status: Married    Spouse Name: N/A    Number of Children: N/A  . Years of Education: N/A   Occupational History  . retired    Social History Main Topics  . Smoking status: Never Smoker   . Smokeless tobacco: Former Neurosurgeon    Types: Chew    Quit date: 07/09/1988  . Alcohol Use: 0.6 oz/week    1 Glasses of wine per week  . Drug Use: No  . Sexually Active: Not on file   Other Topics Concern  . Not on file   Social History Narrative  . No narrative on file    Past Surgical History  Procedure Date  . Coronary artery bypass graft   . Colonoscopy     Family History  Problem Relation Age of Onset  . Diabetes Mother   . Hypertension Mother   . Stroke Father     Allergies  Allergen Reactions  . Penicillins     REACTION: urticaria (hives)  . Tetracycline Hcl     REACTION: urticaria (hives)    Current Outpatient Prescriptions on File Prior to Visit  Medication Sig Dispense Refill  . ACTOS 30  MG tablet TAKE ONE-HALF TABLET BY MOUTH EVERY DAY  90 each  3  . aspirin 81 MG tablet Take 81 mg by mouth daily.        Marland Kitchen atorvastatin (LIPITOR) 40 MG tablet Take 1 tablet (40 mg total) by mouth daily.  30 tablet  11  . chlorthalidone (HYGROTON) 25 MG tablet Take 1 tablet (25 mg total) by mouth daily.  90 tablet  3  . colchicine 0.6 MG tablet Take 1 tablet (0.6 mg total) by mouth daily as needed.  8 tablet  1  . cyclobenzaprine (FLEXERIL) 10 MG tablet Take 10 mg by mouth 2 (two) times daily as needed.        Marland Kitchen glimepiride (AMARYL) 1 MG tablet Take 1 tablet (1 mg total) by mouth daily before breakfast.  30 tablet  5  . insulin NPH-insulin regular (NOVOLIN 70/30) (70-30) 100 UNIT/ML injection 20 units twice daily with meals  10 mL  3  . lisinopril (PRINIVIL,ZESTRIL) 40 MG tablet TAKE ONE TABLET BY MOUTH EVERY DAY  90 tablet  1  . metoprolol (TOPROL-XL) 100 MG 24 hr tablet TAKE 1 TABLET DAILY  90 tablet  1  . NIFEdipine (PROCARDIA-XL) 30 MG (OSM) 24 hr tablet Take 30 mg by mouth 2 (two) times daily.  patient denies chest pain, shortness of breath, orthopnea. Denies lower extremity edema, abdominal pain, change in appetite, change in bowel movements. Patient denies rashes, musculoskeletal complaints. No other specific complaints in a complete review of systems.   BP 132/80  Pulse 80  Temp(Src) 98.2 F (36.8 C) (Oral)  Ht 6' (1.829 m)  Wt 198 lb (89.812 kg)  BMI 26.85 kg/m2  well-developed well-nourished male in no acute distress. HEENT exam atraumatic, normocephalic, neck supple without jugular venous distention. Chest clear to auscultation cardiac exam S1-S2 are regular. Abdominal exam overweight with bowel sounds, soft and nontender. Extremities no edema. Neurologic exam is alert with a normal gait.

## 2011-07-03 ENCOUNTER — Other Ambulatory Visit: Payer: Self-pay | Admitting: Internal Medicine

## 2011-07-04 DIAGNOSIS — H3500 Unspecified background retinopathy: Secondary | ICD-10-CM | POA: Diagnosis not present

## 2011-07-04 DIAGNOSIS — E1139 Type 2 diabetes mellitus with other diabetic ophthalmic complication: Secondary | ICD-10-CM | POA: Diagnosis not present

## 2011-07-10 ENCOUNTER — Other Ambulatory Visit: Payer: Self-pay | Admitting: Nephrology

## 2011-07-10 DIAGNOSIS — R109 Unspecified abdominal pain: Secondary | ICD-10-CM | POA: Diagnosis not present

## 2011-07-11 ENCOUNTER — Ambulatory Visit
Admission: RE | Admit: 2011-07-11 | Discharge: 2011-07-11 | Disposition: A | Discharge status: Home / Self Care | Payer: Medicare Other | Source: Ambulatory Visit | Attending: Nephrology | Admitting: Nephrology

## 2011-07-11 DIAGNOSIS — R10A1 Flank pain, right side: Secondary | ICD-10-CM

## 2011-07-11 DIAGNOSIS — R109 Unspecified abdominal pain: Secondary | ICD-10-CM

## 2011-07-11 DIAGNOSIS — Z87442 Personal history of urinary calculi: Secondary | ICD-10-CM | POA: Diagnosis not present

## 2011-07-11 DIAGNOSIS — N183 Chronic kidney disease, stage 3 unspecified: Secondary | ICD-10-CM

## 2011-07-30 ENCOUNTER — Ambulatory Visit (INDEPENDENT_AMBULATORY_CARE_PROVIDER_SITE_OTHER)
Admission: RE | Admit: 2011-07-30 | Discharge: 2011-07-30 | Disposition: A | Discharge status: Home / Self Care | Payer: Medicare Other | Source: Ambulatory Visit | Attending: Family | Admitting: Family

## 2011-07-30 ENCOUNTER — Encounter: Payer: Self-pay | Admitting: Family

## 2011-07-30 ENCOUNTER — Ambulatory Visit (INDEPENDENT_AMBULATORY_CARE_PROVIDER_SITE_OTHER): Payer: Medicare Other | Admitting: Family

## 2011-07-30 DIAGNOSIS — M25519 Pain in unspecified shoulder: Secondary | ICD-10-CM | POA: Diagnosis not present

## 2011-07-30 DIAGNOSIS — M542 Cervicalgia: Secondary | ICD-10-CM

## 2011-07-30 DIAGNOSIS — M47812 Spondylosis without myelopathy or radiculopathy, cervical region: Secondary | ICD-10-CM | POA: Diagnosis not present

## 2011-07-30 DIAGNOSIS — M5030 Other cervical disc degeneration, unspecified cervical region: Secondary | ICD-10-CM | POA: Diagnosis not present

## 2011-07-30 MED ORDER — CYCLOBENZAPRINE HCL 10 MG PO TABS: 10.0000 mg | ORAL_TABLET | Freq: Two times a day (BID) | ORAL | Status: DC | PRN

## 2011-07-30 MED ORDER — KETOROLAC TROMETHAMINE 60 MG/2ML IM SOLN
60.0000 mg | Freq: Once | INTRAMUSCULAR | Status: AC
  Administered 2011-07-30: 60 mg via INTRAMUSCULAR

## 2011-07-30 MED ORDER — MELOXICAM 15 MG PO TABS: 15.0000 mg | ORAL_TABLET | Freq: Every day | ORAL | Status: DC

## 2011-07-30 NOTE — Patient Instructions (Signed)

## 2011-07-30 NOTE — Progress Notes (Signed)
Subjective:    Patient ID: Matthew [name], male    DOB: 1941-03-01, 71 y.o.   MRN: [id number]  HPI 71 year old African American male, nonsmoker, patient of Dr. Cato Mulligan is in today with complaints of neck pain and right shoulder pain has been more about 2-1/2 weeks. He's been taking Tylenol with no relief, applying hot and cold compresses still with no relief. He describes the pain as achy and sore. The pain is particularly worse when he turns his head to the right when he flexes or extends his neck. The pain tends to be worse in the mornings and when he's been sitting for a long period of time. She denies any past history of any neck or shoulder pain.    Review of Systems  HENT: Positive for neck pain.   Respiratory: Negative.   Cardiovascular: Negative.   Gastrointestinal: Negative.   Musculoskeletal: Positive for arthralgias.        neck and shoulder pain for 2 and half weeks  Skin: Negative.   Neurological: Negative for weakness, light-headedness and numbness.  Hematological: Negative.   Psychiatric/Behavioral: Negative.    Past Medical History  Diagnosis Date  . DIABETES MELLITUS, TYPE II 12/28/2006  . HYPERLIPIDEMIA 12/28/2006  . GOUT 12/28/2006  . HYPERTENSION 12/28/2006  . CORONARY ARTERY DISEASE 12/28/2006  . CLAUDICATION 05/14/2007  . RENAL FAILURE, CHRONIC 09/15/2008  . LATERAL EPICONDYLITIS, RIGHT 12/11/2009  . GERD (gastroesophageal reflux disease)   . Erosive gastritis   . Duodenal ulcer   . Hiatal hernia   . Hemorrhoids     History   Social History  . Marital Status: Married    Spouse Name: N/A    Number of Children: N/A  . Years of Education: N/A   Occupational History  . retired    Social History Main Topics  . Smoking status: Never Smoker   . Smokeless tobacco: Former Neurosurgeon    Types: Chew    Quit date: 07/09/1988  . Alcohol Use: 0.6 oz/week    1 Glasses of wine per week  . Drug Use: No  . Sexually Active: Not on file   Other Topics Concern  . Not on  file   Social History Narrative  . No narrative on file    Past Surgical History  Procedure Date  . Coronary artery bypass graft   . Colonoscopy   . Knee arthroscopy 2012    Family History  Problem Relation Age of Onset  . Diabetes Mother   . Hypertension Mother   . Stroke Father     Allergies  Allergen Reactions  . Penicillins     REACTION: urticaria (hives)  . Tetracycline Hcl     REACTION: urticaria (hives)    Current Outpatient Prescriptions on File Prior to Visit  Medication Sig Dispense Refill  . ACTOS 30 MG tablet TAKE ONE-HALF TABLET BY MOUTH EVERY DAY  90 each  3  . aspirin 81 MG tablet Take 81 mg by mouth daily.        Marland Kitchen atorvastatin (LIPITOR) 40 MG tablet Take 1 tablet (40 mg total) by mouth daily.  30 tablet  11  . chlorthalidone (HYGROTON) 25 MG tablet Take 1 tablet (25 mg total) by mouth daily.  90 tablet  3  . colchicine 0.6 MG tablet Take 1 tablet (0.6 mg total) by mouth daily as needed.  8 tablet  1  . glimepiride (AMARYL) 1 MG tablet TAKE ONE TABLET BY MOUTH EVERY DAY BEFORE BREAKFAST  30 tablet  5  . insulin NPH-insulin regular (NOVOLIN 70/30) (70-30) 100 UNIT/ML injection 20 units twice daily with meals  10 mL  3  . lisinopril (PRINIVIL,ZESTRIL) 40 MG tablet TAKE ONE TABLET BY MOUTH EVERY DAY  90 tablet  1  . metoprolol (TOPROL-XL) 100 MG 24 hr tablet TAKE 1 TABLET DAILY  90 tablet  1  . NIFEdipine (PROCARDIA-XL) 30 MG (OSM) 24 hr tablet Take 30 mg by mouth 2 (two) times daily.         No current facility-administered medications on file prior to visit.    BP 118/78  Temp(Src) 98 F (36.7 C) (Oral)  Wt 204 lb (92.534 kg)chart    Objective:   Physical Exam  Constitutional: He is oriented to person, place, and time. He appears well-developed and well-nourished.  Neck: Normal range of motion. Neck supple.  Cardiovascular: Normal rate, regular rhythm and normal heart sounds.   Pulmonary/Chest: Effort normal and breath sounds normal.  Abdominal:  Soft. Bowel sounds are normal.  Musculoskeletal: Normal range of motion. He exhibits tenderness.       Neck pain with ROM. Especially with right lateral movement, flexion and extension. Pain is also palpable to the right of the c spine into the muscle tissues.   Neurological: He is alert and oriented to person, place, and time.  Skin: Skin is warm and dry.  Psychiatric: He has a normal mood and affect.          Assessment & Plan:  Assessment: Neck pain, Shoulder pain, Osteoarthritis  Plan: X-ray of the C-spine. Due to his past medical history of elevated liver function tests Mobic 15 mg once daily x2 weeks only. Flexeril 10 mg twice daily. Toradol 60 mg IM x1. Ice and heat to the affected area. Patient to call the office if symptoms worsen or persist, recheck when necessary. Will notify patient with results of his x-ray as soon as it is available.

## 2011-09-22 ENCOUNTER — Telehealth: Payer: Self-pay | Admitting: Internal Medicine

## 2011-09-22 NOTE — Telephone Encounter (Signed)
claritin prn Fluticasone NS 2 sprays each nosril qd for 10 days

## 2011-09-22 NOTE — Telephone Encounter (Signed)
Pt is having allergy flare up tingling in nose,watery eyes,uncontrollable sneezing. Pt decline ov. [facility] [address]

## 2011-09-23 MED ORDER — FLUTICASONE PROPIONATE 50 MCG/ACT NA SUSP: 2.0000 | Freq: Every day | NASAL | Status: DC

## 2011-09-23 NOTE — Telephone Encounter (Signed)
Pt aware, rx sent in electronically 

## 2011-10-03 ENCOUNTER — Other Ambulatory Visit (INDEPENDENT_AMBULATORY_CARE_PROVIDER_SITE_OTHER): Payer: Medicare Other

## 2011-10-03 DIAGNOSIS — E119 Type 2 diabetes mellitus without complications: Secondary | ICD-10-CM

## 2011-10-03 LAB — HEPATIC FUNCTION PANEL
Alkaline Phosphatase: 54 U/L (ref 39–117)
Bilirubin, Direct: 0 mg/dL (ref 0.0–0.3)
Total Bilirubin: 0.6 mg/dL (ref 0.3–1.2)
Total Protein: 7.7 g/dL (ref 6.0–8.3)

## 2011-10-03 LAB — LIPID PANEL
LDL Cholesterol: 84 mg/dL (ref 0–99)
Total CHOL/HDL Ratio: 3
VLDL: 14.8 mg/dL (ref 0.0–40.0)

## 2011-10-03 LAB — BASIC METABOLIC PANEL
BUN: 26 mg/dL — ABNORMAL HIGH (ref 6–23)
CO2: 29 mEq/L (ref 19–32)
Calcium: 9.7 mg/dL (ref 8.4–10.5)
Chloride: 104 mEq/L (ref 96–112)
Creatinine, Ser: 1.7 mg/dL — ABNORMAL HIGH (ref 0.4–1.5)
Glucose, Bld: 91 mg/dL (ref 70–99)

## 2011-10-10 ENCOUNTER — Ambulatory Visit: Payer: Medicare Other | Admitting: Internal Medicine

## 2011-10-13 ENCOUNTER — Ambulatory Visit (INDEPENDENT_AMBULATORY_CARE_PROVIDER_SITE_OTHER): Payer: Medicare Other | Admitting: Internal Medicine

## 2011-10-13 VITALS — BP 144/72 | HR 56 | Temp 98.0°F | Wt 204.0 lb

## 2011-10-13 DIAGNOSIS — E119 Type 2 diabetes mellitus without complications: Secondary | ICD-10-CM | POA: Diagnosis not present

## 2011-10-13 DIAGNOSIS — N189 Chronic kidney disease, unspecified: Secondary | ICD-10-CM | POA: Diagnosis not present

## 2011-10-13 DIAGNOSIS — I739 Peripheral vascular disease, unspecified: Secondary | ICD-10-CM

## 2011-10-13 NOTE — Assessment & Plan Note (Signed)
Some improvement Will continue curent meds

## 2011-10-13 NOTE — Assessment & Plan Note (Signed)
No sxs Has f/u with Dr. Excell Seltzer in one month or so.

## 2011-10-13 NOTE — Progress Notes (Signed)
Patient ID: Matthew [name], male   DOB: [birth year], 71 y.o.   MRN: [id number]  patient comes in for followup of multiple medical problems including type 2 diabetes, hyperlipidemia, hypertension. The patient does not check blood sugar or blood pressure at home. The patetient does not follow an exercise or diet program. The patient denies any polyuria, polydipsia.  In the past the patient has gone to diabetic treatment center. The patient is tolerating medications  Without difficulty. The patient does admit to medication compliance.   Has some sinus congestion---much better with fluticasone NS  Past Medical History  Diagnosis Date  . DIABETES MELLITUS, TYPE II 12/28/2006  . HYPERLIPIDEMIA 12/28/2006  . GOUT 12/28/2006  . HYPERTENSION 12/28/2006  . CORONARY ARTERY DISEASE 12/28/2006  . CLAUDICATION 05/14/2007  . RENAL FAILURE, CHRONIC 09/15/2008  . LATERAL EPICONDYLITIS, RIGHT 12/11/2009  . GERD (gastroesophageal reflux disease)   . Erosive gastritis   . Duodenal ulcer   . Hiatal hernia   . Hemorrhoids     History   Social History  . Marital Status: Married    Spouse Name: N/A    Number of Children: N/A  . Years of Education: N/A   Occupational History  . retired    Social History Main Topics  . Smoking status: Never Smoker   . Smokeless tobacco: Former Neurosurgeon    Types: Chew    Quit date: 07/09/1988  . Alcohol Use: 0.6 oz/week    1 Glasses of wine per week  . Drug Use: No  . Sexually Active: Not on file   Other Topics Concern  . Not on file   Social History Narrative  . No narrative on file    Past Surgical History  Procedure Date  . Coronary artery bypass graft   . Colonoscopy   . Knee arthroscopy 2012    Family History  Problem Relation Age of Onset  . Diabetes Mother   . Hypertension Mother   . Stroke Father     Allergies  Allergen Reactions  . Penicillins     REACTION: urticaria (hives)  . Tetracycline Hcl     REACTION: urticaria (hives)    Current  Outpatient Prescriptions on File Prior to Visit  Medication Sig Dispense Refill  . ACTOS 30 MG tablet TAKE ONE-HALF TABLET BY MOUTH EVERY DAY  90 each  3  . aspirin 81 MG tablet Take 81 mg by mouth daily.        Marland Kitchen atorvastatin (LIPITOR) 40 MG tablet Take 1 tablet (40 mg total) by mouth daily.  30 tablet  11  . chlorthalidone (HYGROTON) 25 MG tablet Take 1 tablet (25 mg total) by mouth daily.  90 tablet  3  . colchicine 0.6 MG tablet Take 1 tablet (0.6 mg total) by mouth daily as needed.  8 tablet  1  . cyclobenzaprine (FLEXERIL) 10 MG tablet Take 1 tablet (10 mg total) by mouth 2 (two) times daily as needed.  30 tablet  0  . fluticasone (FLONASE) 50 MCG/ACT nasal spray Place 2 sprays into the nose daily. For 10 days  16 g  0  . glimepiride (AMARYL) 1 MG tablet TAKE ONE TABLET BY MOUTH EVERY DAY BEFORE BREAKFAST  30 tablet  5  . insulin NPH-insulin regular (NOVOLIN 70/30) (70-30) 100 UNIT/ML injection 20 units twice daily with meals  10 mL  3  . lisinopril (PRINIVIL,ZESTRIL) 40 MG tablet TAKE ONE TABLET BY MOUTH EVERY DAY  90 tablet  1  . meloxicam (MOBIC)  15 MG tablet Take 1 tablet (15 mg total) by mouth daily.  30 tablet  0  . metoprolol (TOPROL-XL) 100 MG 24 hr tablet TAKE 1 TABLET DAILY  90 tablet  1  . NIFEdipine (PROCARDIA-XL) 30 MG (OSM) 24 hr tablet Take 30 mg by mouth 2 (two) times daily.           patient denies chest pain, shortness of breath, orthopnea. Denies lower extremity edema, abdominal pain, change in appetite, change in bowel movements. Patient denies rashes, musculoskeletal complaints. No other specific complaints in a complete review of systems.   BP 144/72  Pulse 56  Temp(Src) 98 F (36.7 C) (Oral)  Wt 204 lb (92.534 kg)  well-developed well-nourished male in no acute distress. HEENT exam atraumatic, normocephalic, neck supple without jugular venous distention. Chest clear to auscultation cardiac exam S1-S2 are regular. Abdominal exam overweight with bowel sounds, soft  and nontender. Extremities no edema. Neurologic exam is alert with a normal gait.

## 2011-10-13 NOTE — Assessment & Plan Note (Signed)
CREATININE 1.7 Will continue regular f/u Check labs in 6 months

## 2011-10-30 ENCOUNTER — Encounter: Payer: Self-pay | Admitting: Cardiovascular Disease

## 2011-10-30 ENCOUNTER — Ambulatory Visit (INDEPENDENT_AMBULATORY_CARE_PROVIDER_SITE_OTHER): Payer: Medicare Other | Admitting: Cardiovascular Disease

## 2011-10-30 VITALS — BP 150/78 | Ht 72.0 in | Wt 203.1 lb

## 2011-10-30 DIAGNOSIS — I10 Essential (primary) hypertension: Secondary | ICD-10-CM | POA: Diagnosis not present

## 2011-10-30 DIAGNOSIS — I2510 Atherosclerotic heart disease of native coronary artery without angina pectoris: Secondary | ICD-10-CM

## 2011-10-30 DIAGNOSIS — I70219 Atherosclerosis of native arteries of extremities with intermittent claudication, unspecified extremity: Secondary | ICD-10-CM | POA: Diagnosis not present

## 2011-10-30 NOTE — Progress Notes (Signed)
HPI:  71 year old gentleman presenting for followup evaluation. The patient has coronary artery disease status post CABG in 1997. He has peripheral arterial disease with bilateral SFA occlusions.  Since I have seen him one year ago, he underwent right knee surgery for treatment of a meniscal tear. He still is limited by right knee pain. He also has bilateral calf claudication with ambulation. His calf pain resolves with rest and is unchanged over many years. He has noticed when he gets in a hurry he become short of breath and also has a feeling of "tiredness" in his chest. He denies chest pain. He denies edema, palpitations, orthopnea, or PND. He has also had problems with gout and when he was traveling recently he had a severe flareup of gout, but now is improved after daily use of colchicine.  Outpatient Encounter Prescriptions as of 10/30/2011  Medication Sig Dispense Refill  . ACTOS 30 MG tablet TAKE ONE-HALF TABLET BY MOUTH EVERY DAY  90 each  3  . aspirin 81 MG tablet Take 81 mg by mouth daily.        Marland Kitchen atorvastatin (LIPITOR) 40 MG tablet Take 1 tablet (40 mg total) by mouth daily.  30 tablet  11  . chlorthalidone (HYGROTON) 25 MG tablet Take 1 tablet (25 mg total) by mouth daily.  90 tablet  3  . colchicine 0.6 MG tablet Take 1 tablet (0.6 mg total) by mouth daily as needed.  8 tablet  1  . cyclobenzaprine (FLEXERIL) 10 MG tablet Take 1 tablet (10 mg total) by mouth 2 (two) times daily as needed.  30 tablet  0  . fluticasone (FLONASE) 50 MCG/ACT nasal spray Place 2 sprays into the nose daily. For 10 days  16 g  0  . glimepiride (AMARYL) 1 MG tablet TAKE ONE TABLET BY MOUTH EVERY DAY BEFORE BREAKFAST  30 tablet  5  . insulin NPH-insulin regular (NOVOLIN 70/30) (70-30) 100 UNIT/ML injection 20 units twice daily with meals  10 mL  3  . lisinopril (PRINIVIL,ZESTRIL) 40 MG tablet TAKE ONE TABLET BY MOUTH EVERY DAY  90 tablet  1  . meloxicam (MOBIC) 15 MG tablet Take 15 mg by mouth as needed.        . metoprolol (TOPROL-XL) 100 MG 24 hr tablet TAKE 1 TABLET DAILY  90 tablet  1  . NIFEdipine (PROCARDIA-XL) 30 MG (OSM) 24 hr tablet Take 30 mg by mouth 2 (two) times daily.        Marland Kitchen DISCONTD: meloxicam (MOBIC) 15 MG tablet Take 1 tablet (15 mg total) by mouth daily.  30 tablet  0    Allergies  Allergen Reactions  . Penicillins     REACTION: urticaria (hives)  . Tetracycline Hcl     REACTION: urticaria (hives)    Past Medical History  Diagnosis Date  . DIABETES MELLITUS, TYPE II 12/28/2006  . HYPERLIPIDEMIA 12/28/2006  . GOUT 12/28/2006  . HYPERTENSION 12/28/2006  . CORONARY ARTERY DISEASE 12/28/2006  . CLAUDICATION 05/14/2007  . RENAL FAILURE, CHRONIC 09/15/2008  . LATERAL EPICONDYLITIS, RIGHT 12/11/2009  . GERD (gastroesophageal reflux disease)   . Erosive gastritis   . Duodenal ulcer   . Hiatal hernia   . Hemorrhoids     ROS: Negative except as per HPI  BP 150/78  Ht 6' (1.829 m)  Wt 92.135 kg (203 lb 1.9 oz)  BMI 27.55 kg/m2  PHYSICAL EXAM: Pt is alert and oriented, NAD HEENT: normal Neck: JVP - normal, carotids 2+= without  bruits Lungs: CTA bilaterally CV: RRR without murmur or gallop Abd: soft, NT, Positive BS, no hepatomegaly Ext: no C/C/E, pedal pulses nonpalpable Skin: warm/dry no rash  EKG:  Sinus bradycardia 57 beats per minute, possible left atrial enlargement, otherwise within normal limits  ASSESSMENT AND PLAN: 1. CAD status post CABG. The patient is on appropriate medical therapy. He is now 17 years out from CABG and does have some exertional symptoms that are new for him. I think we should proceed with a Lexiscan Myoview stress test to rule out ischemia as he is at high risk considering his known history of CAD with remote CABG.  2. Peripheral arterial disease with intermittent claudication. Stable symptoms. None bilateral SFA occlusions. Continue medical management.  3. Hypertension. The patient's blood pressure on my recheck was 130/66. He reports  good readings at home. He uses an automated cuff. I wonder if he should still be on chlorthalidone considering his problems with recurrent gout. I going to check with Dr. Cato Mulligan.  4. Followup. I would like to see the patient back in 12 months, unless there are problems identified with his Myoview scan.  Tonny Bollman 10/30/2011

## 2011-10-30 NOTE — Patient Instructions (Signed)
Your physician wants you to follow-up in: 1 year with Dr. Excell Seltzer.  You will receive a reminder letter in the mail two months in advance. If you don't receive a letter, please call our office to schedule the follow-up appointment.  Your physician has requested that you have a lexiscan myoview. For further information please visit [email or web address] Please follow instruction sheet, as given.

## 2011-11-10 ENCOUNTER — Other Ambulatory Visit (HOSPITAL_COMMUNITY): Payer: Medicare Other

## 2011-11-10 ENCOUNTER — Ambulatory Visit (HOSPITAL_COMMUNITY): Payer: Medicare Other | Attending: Cardiology | Admitting: Radiology

## 2011-11-10 VITALS — BP 131/69 | HR 51 | Ht 72.0 in | Wt 202.0 lb

## 2011-11-10 DIAGNOSIS — E119 Type 2 diabetes mellitus without complications: Secondary | ICD-10-CM | POA: Diagnosis not present

## 2011-11-10 DIAGNOSIS — Z794 Long term (current) use of insulin: Secondary | ICD-10-CM | POA: Diagnosis not present

## 2011-11-10 DIAGNOSIS — R079 Chest pain, unspecified: Secondary | ICD-10-CM

## 2011-11-10 DIAGNOSIS — R0609 Other forms of dyspnea: Secondary | ICD-10-CM | POA: Insufficient documentation

## 2011-11-10 DIAGNOSIS — R5381 Other malaise: Secondary | ICD-10-CM | POA: Diagnosis not present

## 2011-11-10 DIAGNOSIS — I10 Essential (primary) hypertension: Secondary | ICD-10-CM | POA: Insufficient documentation

## 2011-11-10 DIAGNOSIS — R000 Tachycardia, unspecified: Secondary | ICD-10-CM | POA: Diagnosis not present

## 2011-11-10 DIAGNOSIS — I739 Peripheral vascular disease, unspecified: Secondary | ICD-10-CM | POA: Insufficient documentation

## 2011-11-10 DIAGNOSIS — R0989 Other specified symptoms and signs involving the circulatory and respiratory systems: Secondary | ICD-10-CM | POA: Insufficient documentation

## 2011-11-10 DIAGNOSIS — Z951 Presence of aortocoronary bypass graft: Secondary | ICD-10-CM | POA: Diagnosis not present

## 2011-11-10 DIAGNOSIS — E785 Hyperlipidemia, unspecified: Secondary | ICD-10-CM | POA: Diagnosis not present

## 2011-11-10 DIAGNOSIS — R0789 Other chest pain: Secondary | ICD-10-CM | POA: Insufficient documentation

## 2011-11-10 DIAGNOSIS — Z8249 Family history of ischemic heart disease and other diseases of the circulatory system: Secondary | ICD-10-CM | POA: Diagnosis not present

## 2011-11-10 DIAGNOSIS — I2510 Atherosclerotic heart disease of native coronary artery without angina pectoris: Secondary | ICD-10-CM | POA: Insufficient documentation

## 2011-11-10 DIAGNOSIS — R0602 Shortness of breath: Secondary | ICD-10-CM | POA: Diagnosis not present

## 2011-11-10 DIAGNOSIS — Z87891 Personal history of nicotine dependence: Secondary | ICD-10-CM | POA: Insufficient documentation

## 2011-11-10 DIAGNOSIS — R5383 Other fatigue: Secondary | ICD-10-CM | POA: Insufficient documentation

## 2011-11-10 MED ORDER — TECHNETIUM TC 99M TETROFOSMIN IV KIT
11.0000 | PACK | Freq: Once | INTRAVENOUS | Status: AC | PRN
  Administered 2011-11-10: 11 via INTRAVENOUS

## 2011-11-10 MED ORDER — TECHNETIUM TC 99M TETROFOSMIN IV KIT
33.0000 | PACK | Freq: Once | INTRAVENOUS | Status: AC | PRN
  Administered 2011-11-10: 33 via INTRAVENOUS

## 2011-11-10 MED ORDER — REGADENOSON 0.4 MG/5ML IV SOLN
0.4000 mg | Freq: Once | INTRAVENOUS | Status: AC
  Administered 2011-11-10: 0.4 mg via INTRAVENOUS

## 2011-11-10 NOTE — Progress Notes (Signed)
[facility] SITE 3 NUCLEAR MED [address] [place] [address] [phone]  Cardiology Nuclear Med Study  Matthew [name] is a 71 y.o. male     MRN : [id number]     DOB: [birth year]  Procedure Date: 11/10/2011  Nuclear Med Background Indication for Stress Test:  Evaluation for Ischemia and Graft Patency History:  '97 CABG;'03 OZH:YQMV lateral, apical wall ischemia>Cath:all grafts patent, mid cfx occluded with coll., EF=65% Cardiac Risk Factors: Claudication, Family History - CAD, History of Smoking, Hypertension, IDDM Type 2, Lipids and PVD  Symptoms:  Chest Pressure with and without Exertion (last episode of chest discomfort was last night), DOE, Fatigue and Rapid HR   Nuclear Pre-Procedure Caffeine/Decaff Intake:  None NPO After: 7:30pm   Lungs:  clear O2 Sat: 94% on room air. IV 0.9% NS with Angio Cath:  22g  IV Site: R Forearm  IV Started by:  Cathlyn Parsons, RN  Chest Size (in):  42 Cup Size: n/a  Height: 6' (1.829 m)  Weight:  202 lb (91.627 kg)  BMI:  Body mass index is 27.40 kg/(m^2). Tech Comments:  Toprol held x 24 hours, Blood sugar 128; pt did not take diabetic meds this am.    Nuclear Med Study 1 or 2 day study: 1 day  Stress Test Type:  Treadmill/Lexiscan  Reading MD: Dietrich Pates, MD  Order Authorizing Provider:  Casimiro Needle Cooper,MD  Resting Radionuclide: Technetium 22m Tetrofosmin  Resting Radionuclide Dose: 11.0 mCi   Stress Radionuclide:  Technetium 25m Tetrofosmin  Stress Radionuclide Dose: 33.0 mCi           Stress Protocol Rest HR: 51 Stress HR: 88  Rest BP: sitting 131/69; standing 156/71 Stress BP: 156/75  Exercise Time (min): 2:00 METS: n/a   Predicted Max HR: 151 bpm % Max HR: 58.28 bpm Rate Pressure Product: 78469   Dose of Adenosine (mg):  n/a Dose of Lexiscan: 0.4 mg  Dose of Atropine (mg): n/a Dose of Dobutamine: n/a mcg/kg/min (at max HR)  Stress Test Technologist: Smiley Houseman, CMA-N  Nuclear Technologist:  Domenic Polite, CNMT     Rest Procedure:  Myocardial perfusion imaging was performed at rest 45 minutes following the intravenous administration of Technetium 42m Tetrofosmin.  Rest ECG: No acute changes  Stress Procedure:  The patient received IV Lexiscan 0.4 mg over 15-seconds with concurrent low level exercise and then Technetium 54m Tetrofosmin was injected at 30-seconds while the patient continued walking one more minute. There were no significant changes with Lexiscan. Quantitative spect images were obtained after a 45-minute delay.  Stress ECG: No significant ST segment change suggestive of ischemia.  QPS Raw Data Images:  Acquisition technically good; normal left ventricular size. Stress Images:  There is decreased uptake in the inferior wall. Rest Images:  There is decreased uptake in the inferior wall. Subtraction (SDS):  No evidence of ischemia. Transient Ischemic Dilatation (Normal <1.22): 1.05 Lung/Heart Ratio (Normal <0.45):  0.51  Quantitative Gated Spect Images QGS EDV:  91 ml QGS ESV:  37 ml  Impression Exercise Capacity:  Lexiscan with low level exercise. BP Response:  Normal blood pressure response. Clinical Symptoms:  No chest pain or dyspnea. ECG Impression:  No significant ST segment change suggestive of ischemia. Comparison with Prior Nuclear Study: No images to compare  Overall Impression:  Normal stress nuclear study with a small, mild, fixed inferobasal defect consistent with thinning; no ischemia.  LV Ejection Fraction: 60%.  LV Wall Motion:  NL LV  Function; NL Wall Motion   [name]

## 2011-12-29 ENCOUNTER — Other Ambulatory Visit: Payer: Self-pay | Admitting: Internal Medicine

## 2012-02-10 ENCOUNTER — Other Ambulatory Visit: Payer: Self-pay | Admitting: Internal Medicine

## 2012-04-13 ENCOUNTER — Other Ambulatory Visit: Payer: Self-pay | Admitting: Internal Medicine

## 2012-04-13 DIAGNOSIS — R911 Solitary pulmonary nodule: Secondary | ICD-10-CM

## 2012-04-14 ENCOUNTER — Ambulatory Visit: Payer: Medicare Other | Admitting: Internal Medicine

## 2012-04-14 ENCOUNTER — Other Ambulatory Visit: Payer: Medicare Other

## 2012-04-16 ENCOUNTER — Ambulatory Visit (INDEPENDENT_AMBULATORY_CARE_PROVIDER_SITE_OTHER)
Admission: RE | Admit: 2012-04-16 | Discharge: 2012-04-16 | Disposition: A | Discharge status: Home / Self Care | Payer: Medicare Other | Source: Ambulatory Visit | Attending: Internal Medicine | Admitting: Internal Medicine

## 2012-04-16 DIAGNOSIS — R911 Solitary pulmonary nodule: Secondary | ICD-10-CM

## 2012-04-19 ENCOUNTER — Telehealth: Payer: Self-pay | Admitting: Internal Medicine

## 2012-04-19 NOTE — Telephone Encounter (Signed)
Pt needs chest ct scan result

## 2012-04-22 NOTE — Telephone Encounter (Signed)
Pt aware of results 

## 2012-05-06 ENCOUNTER — Other Ambulatory Visit: Payer: Self-pay | Admitting: *Deleted

## 2012-05-06 MED ORDER — PIOGLITAZONE HCL 30 MG PO TABS: 30.0000 mg | ORAL_TABLET | Freq: Every day | ORAL | Status: DC

## 2012-05-07 ENCOUNTER — Other Ambulatory Visit: Payer: Self-pay | Admitting: Internal Medicine

## 2012-05-07 DIAGNOSIS — R0602 Shortness of breath: Secondary | ICD-10-CM

## 2012-05-12 ENCOUNTER — Telehealth: Payer: Self-pay | Admitting: *Deleted

## 2012-05-12 NOTE — Telephone Encounter (Signed)
Opened in error

## 2012-05-17 DIAGNOSIS — R911 Solitary pulmonary nodule: Secondary | ICD-10-CM | POA: Insufficient documentation

## 2012-05-24 ENCOUNTER — Ambulatory Visit: Payer: Medicare Other | Admitting: Internal Medicine

## 2012-05-30 LAB — HM DIABETES EYE EXAM

## 2012-05-31 ENCOUNTER — Ambulatory Visit (INDEPENDENT_AMBULATORY_CARE_PROVIDER_SITE_OTHER): Payer: Medicare Other | Admitting: Internal Medicine

## 2012-05-31 DIAGNOSIS — R0602 Shortness of breath: Secondary | ICD-10-CM

## 2012-05-31 LAB — PULMONARY FUNCTION TEST

## 2012-05-31 NOTE — Progress Notes (Signed)
PFT done today. 

## 2012-06-15 ENCOUNTER — Encounter: Payer: Self-pay | Admitting: Internal Medicine

## 2012-07-06 ENCOUNTER — Encounter: Payer: Self-pay | Admitting: Internal Medicine

## 2012-07-06 ENCOUNTER — Ambulatory Visit (INDEPENDENT_AMBULATORY_CARE_PROVIDER_SITE_OTHER): Payer: Medicare Other | Admitting: Internal Medicine

## 2012-07-06 VITALS — BP 114/64 | HR 60 | Temp 98.3°F | Wt 207.0 lb

## 2012-07-06 DIAGNOSIS — N189 Chronic kidney disease, unspecified: Secondary | ICD-10-CM

## 2012-07-06 DIAGNOSIS — R911 Solitary pulmonary nodule: Secondary | ICD-10-CM

## 2012-07-06 DIAGNOSIS — I10 Essential (primary) hypertension: Secondary | ICD-10-CM | POA: Diagnosis not present

## 2012-07-06 DIAGNOSIS — E785 Hyperlipidemia, unspecified: Secondary | ICD-10-CM | POA: Diagnosis not present

## 2012-07-06 DIAGNOSIS — E119 Type 2 diabetes mellitus without complications: Secondary | ICD-10-CM | POA: Diagnosis not present

## 2012-07-06 LAB — HM DIABETES FOOT EXAM

## 2012-07-06 LAB — CBC WITH DIFFERENTIAL/PLATELET
Basophils Relative: 0.4 % (ref 0.0–3.0)
Eosinophils Absolute: 0.3 10*3/uL (ref 0.0–0.7)
Lymphocytes Relative: 24.9 % (ref 12.0–46.0)
MCHC: 33.1 g/dL (ref 30.0–36.0)
Monocytes Relative: 9.5 % (ref 3.0–12.0)
Neutrophils Relative %: 59.3 % (ref 43.0–77.0)
RBC: 4.98 Mil/uL (ref 4.22–5.81)
WBC: 5.1 10*3/uL (ref 4.5–10.5)

## 2012-07-06 LAB — HEPATIC FUNCTION PANEL
ALT: 19 U/L (ref 0–53)
AST: 21 U/L (ref 0–37)
Alkaline Phosphatase: 55 U/L (ref 39–117)
Bilirubin, Direct: 0.1 mg/dL (ref 0.0–0.3)
Total Bilirubin: 0.9 mg/dL (ref 0.3–1.2)

## 2012-07-06 LAB — LIPID PANEL
Cholesterol: 145 mg/dL (ref 0–200)
LDL Cholesterol: 87 mg/dL (ref 0–99)
Total CHOL/HDL Ratio: 4
VLDL: 20 mg/dL (ref 0.0–40.0)

## 2012-07-06 LAB — BASIC METABOLIC PANEL
CO2: 33 mEq/L — ABNORMAL HIGH (ref 19–32)
Sodium: 142 mEq/L (ref 135–145)

## 2012-07-06 MED ORDER — FLUTICASONE PROPIONATE 50 MCG/ACT NA SUSP: 2.0000 | Freq: Every day | NASAL | Status: DC

## 2012-07-06 NOTE — Assessment & Plan Note (Signed)
Discussed at length- needs f/u in 4 months

## 2012-07-06 NOTE — Progress Notes (Signed)
Patient ID: Matthew [name], male   DOB: [birth year], 72 y.o.   MRN: [id number]  patient comes in for followup of multiple medical problems including type 2 diabetes, hyperlipidemia, hypertension. The patient does check blood sugar and blood pressure at home. The patetient does not follow an exercise or diet program. The patient denies any polyuria, polydipsia.  In the past the patient has gone to diabetic treatment center. The patient is tolerating medications  Without difficulty. The patient does admit to medication compliance.   Past Medical History  Diagnosis Date  . DIABETES MELLITUS, TYPE II 12/28/2006  . HYPERLIPIDEMIA 12/28/2006  . GOUT 12/28/2006  . HYPERTENSION 12/28/2006  . CORONARY ARTERY DISEASE 12/28/2006  . CLAUDICATION 05/14/2007  . RENAL FAILURE, CHRONIC 09/15/2008  . LATERAL EPICONDYLITIS, RIGHT 12/11/2009  . GERD (gastroesophageal reflux disease)   . Erosive gastritis   . Duodenal ulcer   . Hiatal hernia   . Hemorrhoids     History   Social History  . Marital Status: Married    Spouse Name: N/A    Number of Children: N/A  . Years of Education: N/A   Occupational History  . retired    Social History Main Topics  . Smoking status: Never Smoker   . Smokeless tobacco: Former Neurosurgeon    Types: Chew    Quit date: 07/09/1988  . Alcohol Use: 0.6 oz/week    1 Glasses of wine per week  . Drug Use: No  . Sexually Active: Not on file   Other Topics Concern  . Not on file   Social History Narrative  . No narrative on file    Past Surgical History  Procedure Date  . Coronary artery bypass graft   . Colonoscopy   . Knee arthroscopy 2012    Family History  Problem Relation Age of Onset  . Diabetes Mother   . Hypertension Mother   . Stroke Father     Allergies  Allergen Reactions  . Penicillins     REACTION: urticaria (hives)  . Tetracycline Hcl     REACTION: urticaria (hives)    Current Outpatient Prescriptions on File Prior to Visit  Medication Sig  Dispense Refill  . aspirin 81 MG tablet Take 81 mg by mouth daily.        Marland Kitchen atorvastatin (LIPITOR) 40 MG tablet TAKE ONE TABLET BY MOUTH EVERY DAY  90 tablet  1  . chlorthalidone (HYGROTON) 25 MG tablet TAKE ONE TABLET BY MOUTH EVERY DAY  90 tablet  1  . colchicine 0.6 MG tablet Take 1 tablet (0.6 mg total) by mouth daily as needed.  8 tablet  1  . cyclobenzaprine (FLEXERIL) 10 MG tablet Take 1 tablet (10 mg total) by mouth 2 (two) times daily as needed.  30 tablet  0  . fluticasone (FLONASE) 50 MCG/ACT nasal spray Place 2 sprays into the nose daily. For 10 days  16 g  5  . glimepiride (AMARYL) 1 MG tablet TAKE ONE TABLET BY MOUTH EVERY DAY BEFORE  BREAKFAST  30 tablet  5  . lisinopril (PRINIVIL,ZESTRIL) 40 MG tablet TAKE ONE TABLET BY MOUTH EVERY DAY  90 tablet  1  . meloxicam (MOBIC) 15 MG tablet Take 15 mg by mouth as needed.      . metoprolol succinate (TOPROL-XL) 100 MG 24 hr tablet TAKE 1 TABLET DAILY  90 tablet  1  . NIFEdipine (PROCARDIA-XL) 30 MG (OSM) 24 hr tablet Take 30 mg by mouth 2 (two) times daily.        Marland Kitchen  NOVOLIN 70/30 RELION (70-30) 100 UNIT/ML injection INJECT 20 UNITS SUBCUTANEOUSLY TWICE DAILY  10 mL  4  . pioglitazone (ACTOS) 30 MG tablet Take 1 tablet (30 mg total) by mouth daily.  90 tablet  1     patient denies chest pain, shortness of breath, orthopnea. Denies lower extremity edema, abdominal pain, change in appetite, change in bowel movements. Patient denies rashes, musculoskeletal complaints. No other specific complaints in a complete review of systems.   BP 114/64  Pulse 60  Temp 98.3 F (36.8 C) (Oral)  Wt 207 lb (93.895 kg)  well-developed well-nourished male in no acute distress. HEENT exam atraumatic, normocephalic, neck supple without jugular venous distention. Chest clear to auscultation cardiac exam S1-S2 are regular. Abdominal exam overweight with bowel sounds, soft and nontender. Extremities no edema. Neurologic exam is alert with a normal gait.

## 2012-07-06 NOTE — Assessment & Plan Note (Signed)
Needs continued monitoring- check labs today

## 2012-07-06 NOTE — Assessment & Plan Note (Signed)
Well controlled Continue meds 

## 2012-07-06 NOTE — Assessment & Plan Note (Signed)
Needs to lose weight and exercise daily Check labs today

## 2012-08-02 ENCOUNTER — Other Ambulatory Visit: Payer: Self-pay | Admitting: Internal Medicine

## 2012-08-03 ENCOUNTER — Other Ambulatory Visit: Payer: Self-pay | Admitting: Internal Medicine

## 2012-08-19 ENCOUNTER — Telehealth: Payer: Self-pay | Admitting: Internal Medicine

## 2012-08-19 NOTE — Telephone Encounter (Signed)
Patient Information:  Caller Name: Simran  Phone: [phone]  Patient: Matthew [name], Matthew [name]  Gender: Male  DOB: February 03, 1941  Age: 72 Years  PCP: Birdie Sons (Adults only)  Office Follow Up:  Does the office need to follow up with this patient?: Yes  Instructions For The Office: Nurse advised appt.  Pt declined.  Requests abx to be called in.  Last in office on 07/06/12.  RN Note:  Taksh was in the office on 07/06/12.  States he was placed on Flonase d/t nasal discharge.  Symptoms are not improving.  Nasal discharge is now green.  Requesting script for antibiotic to be called in.  Mild sinus pressure.  Symptoms  Reason For Call & Symptoms: Sinus issues  Reviewed Health History In EMR: Yes  Reviewed Medications In EMR: Yes  Reviewed Allergies In EMR: Yes  Reviewed Surgeries / Procedures: Yes  Date of Onset of Symptoms: 07/06/2012  Treatments Tried: Flonase  Treatments Tried Worked: No  Guideline(s) Used:  Colds  Disposition Per Guideline:   See Today or Tomorrow in Office  Reason For Disposition Reached:   Sinus congestion (pressure, fullness) present > 10 days  Advice Given:  Call Back If:  Difficulty breathing occurs  You become worse  Patient Refused Recommendation:  Patient Requests Prescription  Pt declines appt.  Requesting abx to be called in since he was in the office on 07/06/12.

## 2012-08-19 NOTE — Telephone Encounter (Signed)
Call in doxycycline 100 mg po bid for 10 days

## 2012-08-20 MED ORDER — DOXYCYCLINE HYCLATE 100 MG PO TABS: 100.0000 mg | ORAL_TABLET | Freq: Two times a day (BID) | ORAL | Status: DC

## 2012-08-20 NOTE — Telephone Encounter (Signed)
rx called in, pt aware 

## 2012-09-01 ENCOUNTER — Other Ambulatory Visit: Payer: Self-pay | Admitting: Internal Medicine

## 2012-10-20 ENCOUNTER — Other Ambulatory Visit: Payer: Self-pay | Admitting: Internal Medicine

## 2012-11-17 ENCOUNTER — Ambulatory Visit (INDEPENDENT_AMBULATORY_CARE_PROVIDER_SITE_OTHER)
Admission: RE | Admit: 2012-11-17 | Discharge: 2012-11-17 | Disposition: A | Discharge status: Home / Self Care | Payer: Medicare Other | Source: Ambulatory Visit | Attending: Internal Medicine | Admitting: Internal Medicine

## 2012-11-17 DIAGNOSIS — R911 Solitary pulmonary nodule: Secondary | ICD-10-CM

## 2012-11-18 ENCOUNTER — Other Ambulatory Visit: Payer: Self-pay | Admitting: *Deleted

## 2012-11-18 DIAGNOSIS — M109 Gout, unspecified: Secondary | ICD-10-CM

## 2012-11-18 MED ORDER — COLCHICINE 0.6 MG PO TABS: 0.6000 mg | ORAL_TABLET | Freq: Every day | ORAL | Status: DC | PRN

## 2012-11-18 MED ORDER — ATORVASTATIN CALCIUM 40 MG PO TABS: ORAL_TABLET | ORAL | Status: DC

## 2012-12-03 ENCOUNTER — Other Ambulatory Visit: Payer: Self-pay | Admitting: *Deleted

## 2012-12-03 ENCOUNTER — Other Ambulatory Visit: Payer: Self-pay | Admitting: Internal Medicine

## 2012-12-03 MED ORDER — GLIMEPIRIDE 1 MG PO TABS: ORAL_TABLET | ORAL | Status: DC

## 2012-12-07 ENCOUNTER — Other Ambulatory Visit: Payer: Self-pay | Admitting: Internal Medicine

## 2012-12-20 ENCOUNTER — Telehealth: Payer: Self-pay | Admitting: Internal Medicine

## 2012-12-20 DIAGNOSIS — M79671 Pain in right foot: Secondary | ICD-10-CM

## 2012-12-20 NOTE — Telephone Encounter (Signed)
PT called and stated that he is having trouble with his foot again. He believes that its not only gout again, but a fracture from a previous injury. He would like an xray and or to see an orthopedic doctor. Please assist.

## 2012-12-21 NOTE — Telephone Encounter (Signed)
Referral order placed, pt aware 

## 2012-12-21 NOTE — Telephone Encounter (Signed)
Refer to [facility] for foot pain

## 2012-12-30 ENCOUNTER — Other Ambulatory Visit: Payer: Self-pay | Admitting: *Deleted

## 2012-12-30 MED ORDER — CHLORTHALIDONE 25 MG PO TABS: ORAL_TABLET | ORAL | Status: DC

## 2013-01-03 DIAGNOSIS — M19079 Primary osteoarthritis, unspecified ankle and foot: Secondary | ICD-10-CM | POA: Diagnosis not present

## 2013-01-03 DIAGNOSIS — M659 Synovitis and tenosynovitis, unspecified: Secondary | ICD-10-CM | POA: Diagnosis not present

## 2013-01-03 DIAGNOSIS — M25579 Pain in unspecified ankle and joints of unspecified foot: Secondary | ICD-10-CM | POA: Diagnosis not present

## 2013-01-04 ENCOUNTER — Other Ambulatory Visit: Payer: Self-pay | Admitting: *Deleted

## 2013-01-04 MED ORDER — LISINOPRIL 40 MG PO TABS: ORAL_TABLET | ORAL | Status: DC

## 2013-01-06 DIAGNOSIS — M25579 Pain in unspecified ankle and joints of unspecified foot: Secondary | ICD-10-CM | POA: Diagnosis not present

## 2013-01-10 DIAGNOSIS — M25579 Pain in unspecified ankle and joints of unspecified foot: Secondary | ICD-10-CM | POA: Diagnosis not present

## 2013-01-12 DIAGNOSIS — M25579 Pain in unspecified ankle and joints of unspecified foot: Secondary | ICD-10-CM | POA: Diagnosis not present

## 2013-01-14 DIAGNOSIS — M25579 Pain in unspecified ankle and joints of unspecified foot: Secondary | ICD-10-CM | POA: Diagnosis not present

## 2013-01-17 DIAGNOSIS — M25579 Pain in unspecified ankle and joints of unspecified foot: Secondary | ICD-10-CM | POA: Diagnosis not present

## 2013-01-19 DIAGNOSIS — M25579 Pain in unspecified ankle and joints of unspecified foot: Secondary | ICD-10-CM | POA: Diagnosis not present

## 2013-01-25 DIAGNOSIS — M25579 Pain in unspecified ankle and joints of unspecified foot: Secondary | ICD-10-CM | POA: Diagnosis not present

## 2013-01-26 ENCOUNTER — Ambulatory Visit (INDEPENDENT_AMBULATORY_CARE_PROVIDER_SITE_OTHER): Payer: Medicare Other | Admitting: Cardiovascular Disease

## 2013-01-26 ENCOUNTER — Encounter: Payer: Self-pay | Admitting: Cardiovascular Disease

## 2013-01-26 VITALS — BP 122/66 | HR 58 | Ht 72.0 in | Wt 200.0 lb

## 2013-01-26 DIAGNOSIS — I10 Essential (primary) hypertension: Secondary | ICD-10-CM

## 2013-01-26 DIAGNOSIS — I2510 Atherosclerotic heart disease of native coronary artery without angina pectoris: Secondary | ICD-10-CM | POA: Diagnosis not present

## 2013-01-26 DIAGNOSIS — E785 Hyperlipidemia, unspecified: Secondary | ICD-10-CM | POA: Diagnosis not present

## 2013-01-26 NOTE — Progress Notes (Signed)
HPI:  72 year old gentleman presenting for followup evaluation. The patient has coronary artery disease status post CABG in 1997. He has peripheral arterial disease with bilateral SFA occlusions. When I saw him one year ago he complained of exertional dyspnea and chest discomfort. Her nuclear stress scan was done and demonstrated normal perfusion with a left ventricular ejection fraction of 60%. Labs from January showed a cholesterol of 145, HDL 38, LDL 87, and triglycerides 956. I saw the patient last year and he was having problems with gallop. Unfortunately he's continued to have difficulty. He complains of pain and swelling in his right foot. He's taking colchicine with some improvement. He denies claudication symptoms at present. He denies chest pain or pressure, dyspnea, palpitations, lightheadedness, or syncope.  Outpatient Encounter Prescriptions as of 01/26/2013  Medication Sig Dispense Refill  . aspirin 81 MG tablet Take 81 mg by mouth daily.        Marland Kitchen atorvastatin (LIPITOR) 40 MG tablet TAKE ONE TABLET BY MOUTH EVERY DAY  90 tablet  1  . chlorthalidone (HYGROTON) 25 MG tablet TAKE ONE TABLET BY MOUTH EVERY DAY  90 tablet  1  . colchicine (COLCRYS) 0.6 MG tablet Take 0.6 mg by mouth daily.      . cyclobenzaprine (FLEXERIL) 10 MG tablet TAKE TWO  TABLET AS NEEDE      . fluticasone (FLONASE) 50 MCG/ACT nasal spray Place 2 sprays into the nose daily. For 10 days  16 g  5  . glimepiride (AMARYL) 1 MG tablet TAKE ONE TABLET BY MOUTH ONCE DAILY BEFORE  BREAKFAST  30 tablet  5  . lisinopril (PRINIVIL,ZESTRIL) 40 MG tablet TAKE ONE TABLET BY MOUTH EVERY DAY  90 tablet  1  . metoprolol succinate (TOPROL-XL) 100 MG 24 hr tablet TAKE 1 TABLET DAILY  90 tablet  PRN  . NIFEdipine (PROCARDIA-XL) 30 MG (OSM) 24 hr tablet Take 30 mg by mouth 2 (two) times daily.        Marland Kitchen NOVOLIN 70/30 RELION (70-30) 100 UNIT/ML injection INJECT 20 UNITS SUBCUTANEOUSLY TWICE DAILY  10 mL  4  . pioglitazone (ACTOS) 30 MG  tablet Take 1 tablet (30 mg total) by mouth daily.  90 tablet  1  . [DISCONTINUED] cyclobenzaprine (FLEXERIL) 10 MG tablet Take 1 tablet (10 mg total) by mouth 2 (two) times daily as needed.  30 tablet  0  . [DISCONTINUED] colchicine 0.6 MG tablet Take 1 tablet (0.6 mg total) by mouth daily as needed.  8 tablet  3  . [DISCONTINUED] doxycycline (VIBRA-TABS) 100 MG tablet Take 1 tablet (100 mg total) by mouth 2 (two) times daily.  20 tablet  0   No facility-administered encounter medications on file as of 01/26/2013.    Allergies  Allergen Reactions  . Penicillins     REACTION: urticaria (hives)  . Tetracycline Hcl     REACTION: urticaria (hives)    Past Medical History  Diagnosis Date  . DIABETES MELLITUS, TYPE II 12/28/2006  . HYPERLIPIDEMIA 12/28/2006  . GOUT 12/28/2006  . HYPERTENSION 12/28/2006  . CORONARY ARTERY DISEASE 12/28/2006  . CLAUDICATION 05/14/2007  . RENAL FAILURE, CHRONIC 09/15/2008  . LATERAL EPICONDYLITIS, RIGHT 12/11/2009  . GERD (gastroesophageal reflux disease)   . Erosive gastritis   . Duodenal ulcer   . Hiatal hernia   . Hemorrhoids     ROS: Negative except as per HPI  BP 122/66  Pulse 58  Ht 6' (1.829 m)  Wt 200 lb (90.719 kg)  BMI 27.12  kg/m2  PHYSICAL EXAM: Pt is alert and oriented, NAD HEENT: normal Neck: JVP - normal, carotids 2+= without bruits Lungs: CTA bilaterally CV: RRR without murmur or gallop Abd: soft, NT, Positive BS, no hepatomegaly Ext: no C/C/E, distal pulses intact and equal Skin: warm/dry no rash  EKG:  Sinus bradycardia 58 beats per minute, otherwise within normal limits.  Lexiscan Myoview study 11/10/2011: Impression  Exercise Capacity: Lexiscan with low level exercise.  BP Response: Normal blood pressure response.  Clinical Symptoms: No chest pain or dyspnea.  ECG Impression: No significant ST segment change suggestive of ischemia.  Comparison with Prior Nuclear Study: No images to compare  Overall Impression: Normal  stress nuclear study with a small, mild, fixed inferobasal defect consistent with thinning; no ischemia.  LV Ejection Fraction: 60%. LV Wall Motion: NL LV Function; NL Wall Motion   ASSESSMENT AND PLAN: 1. CAD status post CABG. The patient is stable on his current medical therapy. He is approaching 20 years out from CABG. His nuclear scan was within normal limits last year with no ischemia demonstrated. He will continue on his current cardiac medications which include aspirin and atorvastatin.  2. Peripheral arterial disease with intermittent claudication. He has known bilateral SFA occlusions. Continue current medical therapy.  3. Hypertension. Blood pressure is well controlled. I'm going to stop his chlorthalidone because of continued gout. He will continue on his other meds without changes.  4. Hyperlipidemia. On high-intensity statin with lipitor 40 mg.  Tonny Bollman 01/26/2013 2:47 PM

## 2013-01-26 NOTE — Patient Instructions (Signed)
Your physician has recommended you make the following change in your medication: STOP Chlorthalidone  Your physician wants you to follow-up in: 1 YEAR with Dr Excell Seltzer.  You will receive a reminder letter in the mail two months in advance. If you don't receive a letter, please call our office to schedule the follow-up appointment.

## 2013-02-02 ENCOUNTER — Other Ambulatory Visit: Payer: Self-pay

## 2013-02-07 DIAGNOSIS — M25579 Pain in unspecified ankle and joints of unspecified foot: Secondary | ICD-10-CM | POA: Diagnosis not present

## 2013-02-09 ENCOUNTER — Telehealth: Payer: Self-pay | Admitting: Internal Medicine

## 2013-02-09 NOTE — Telephone Encounter (Signed)
Pt would like a1c test. Can I sch? °

## 2013-02-09 NOTE — Telephone Encounter (Signed)
Bmet, a1c, lfts, lipids, urine microalbumin

## 2013-02-10 NOTE — Telephone Encounter (Signed)
This is what he wants ordered=-thanks

## 2013-02-10 NOTE — Telephone Encounter (Signed)
Pt has been sch for 02-11-13

## 2013-02-11 ENCOUNTER — Other Ambulatory Visit (INDEPENDENT_AMBULATORY_CARE_PROVIDER_SITE_OTHER): Payer: Medicare Other

## 2013-02-11 DIAGNOSIS — E1110 Type 2 diabetes mellitus with ketoacidosis without coma: Secondary | ICD-10-CM

## 2013-02-11 DIAGNOSIS — E785 Hyperlipidemia, unspecified: Secondary | ICD-10-CM

## 2013-02-11 LAB — HEPATIC FUNCTION PANEL
ALT: 14 U/L (ref 0–53)
AST: 18 U/L (ref 0–37)
Alkaline Phosphatase: 47 U/L (ref 39–117)
Bilirubin, Direct: 0.1 mg/dL (ref 0.0–0.3)
Total Bilirubin: 0.5 mg/dL (ref 0.3–1.2)

## 2013-02-11 LAB — LIPID PANEL
Cholesterol: 127 mg/dL (ref 0–200)
LDL Cholesterol: 73 mg/dL (ref 0–99)
Total CHOL/HDL Ratio: 3
VLDL: 14.8 mg/dL (ref 0.0–40.0)

## 2013-02-11 LAB — BASIC METABOLIC PANEL
CO2: 29 mEq/L (ref 19–32)
Chloride: 106 mEq/L (ref 96–112)
Potassium: 4.5 mEq/L (ref 3.5–5.1)

## 2013-02-11 LAB — MICROALBUMIN / CREATININE URINE RATIO
Creatinine,U: 213.5 mg/dL
Microalb Creat Ratio: 1.6 mg/g (ref 0.0–30.0)
Microalb, Ur: 3.4 mg/dL — ABNORMAL HIGH (ref 0.0–1.9)

## 2013-02-11 LAB — HEMOGLOBIN A1C: Hgb A1c MFr Bld: 8 % — ABNORMAL HIGH (ref 4.6–6.5)

## 2013-02-14 DIAGNOSIS — M25579 Pain in unspecified ankle and joints of unspecified foot: Secondary | ICD-10-CM | POA: Diagnosis not present

## 2013-02-22 ENCOUNTER — Other Ambulatory Visit: Payer: Self-pay | Admitting: Internal Medicine

## 2013-02-25 DIAGNOSIS — M19079 Primary osteoarthritis, unspecified ankle and foot: Secondary | ICD-10-CM | POA: Diagnosis not present

## 2013-02-25 DIAGNOSIS — M25579 Pain in unspecified ankle and joints of unspecified foot: Secondary | ICD-10-CM | POA: Diagnosis not present

## 2013-03-25 DIAGNOSIS — M109 Gout, unspecified: Secondary | ICD-10-CM | POA: Diagnosis not present

## 2013-03-25 DIAGNOSIS — M19079 Primary osteoarthritis, unspecified ankle and foot: Secondary | ICD-10-CM | POA: Diagnosis not present

## 2013-03-25 DIAGNOSIS — Z23 Encounter for immunization: Secondary | ICD-10-CM | POA: Diagnosis not present

## 2013-03-25 DIAGNOSIS — N390 Urinary tract infection, site not specified: Secondary | ICD-10-CM | POA: Diagnosis not present

## 2013-03-25 DIAGNOSIS — M25579 Pain in unspecified ankle and joints of unspecified foot: Secondary | ICD-10-CM | POA: Diagnosis not present

## 2013-05-05 ENCOUNTER — Other Ambulatory Visit: Payer: Self-pay

## 2013-05-09 ENCOUNTER — Encounter (HOSPITAL_COMMUNITY): Payer: Self-pay | Admitting: Cardiovascular Disease

## 2013-05-17 ENCOUNTER — Ambulatory Visit: Payer: Medicare Other | Admitting: Internal Medicine

## 2013-06-13 ENCOUNTER — Encounter: Payer: Self-pay | Admitting: Internal Medicine

## 2013-06-21 ENCOUNTER — Encounter: Payer: Self-pay | Admitting: Internal Medicine

## 2013-06-21 ENCOUNTER — Ambulatory Visit (INDEPENDENT_AMBULATORY_CARE_PROVIDER_SITE_OTHER): Payer: Medicare Other | Admitting: Internal Medicine

## 2013-06-21 VITALS — BP 104/54 | HR 76 | Temp 98.6°F | Ht 72.0 in | Wt 200.0 lb

## 2013-06-21 DIAGNOSIS — I2510 Atherosclerotic heart disease of native coronary artery without angina pectoris: Secondary | ICD-10-CM

## 2013-06-21 DIAGNOSIS — E119 Type 2 diabetes mellitus without complications: Secondary | ICD-10-CM | POA: Diagnosis not present

## 2013-06-21 DIAGNOSIS — M109 Gout, unspecified: Secondary | ICD-10-CM

## 2013-06-21 DIAGNOSIS — I10 Essential (primary) hypertension: Secondary | ICD-10-CM | POA: Diagnosis not present

## 2013-06-21 DIAGNOSIS — N189 Chronic kidney disease, unspecified: Secondary | ICD-10-CM

## 2013-06-21 DIAGNOSIS — E785 Hyperlipidemia, unspecified: Secondary | ICD-10-CM

## 2013-06-21 DIAGNOSIS — M25511 Pain in right shoulder: Secondary | ICD-10-CM

## 2013-06-21 DIAGNOSIS — M25519 Pain in unspecified shoulder: Secondary | ICD-10-CM

## 2013-06-21 LAB — HEPATIC FUNCTION PANEL
Albumin: 4.2 g/dL (ref 3.5–5.2)
Total Protein: 7.6 g/dL (ref 6.0–8.3)

## 2013-06-21 LAB — BASIC METABOLIC PANEL
BUN: 23 mg/dL (ref 6–23)
CO2: 29 mEq/L (ref 19–32)
Calcium: 9.4 mg/dL (ref 8.4–10.5)
GFR: 65.91 mL/min (ref >60.00)
Glucose, Bld: 91 mg/dL (ref 70–99)

## 2013-06-21 LAB — LIPID PANEL
Cholesterol: 141 mg/dL (ref 0–200)
HDL: 41.6 mg/dL (ref >39.00)
VLDL: 14.2 mg/dL (ref 0.0–40.0)

## 2013-06-21 LAB — MICROALBUMIN / CREATININE URINE RATIO
Creatinine,U: 162.5 mg/dL
Microalb Creat Ratio: 0.4 mg/g (ref 0.0–30.0)
Microalb, Ur: 0.6 mg/dL (ref 0.0–1.9)

## 2013-06-21 NOTE — Progress Notes (Signed)
Right shoulder pain for 3 + months. He is active and has been working in the yard. Pain is present at rest and increases with any movement. Pain also exacerbated by coughing.  Gout- has resolved  DM2- home cbgs in the 90-110 range.   CAD- no chest pain , SOB, pnd, orhtopnea  Past Medical History  Diagnosis Date  . DIABETES MELLITUS, TYPE II 12/28/2006  . HYPERLIPIDEMIA 12/28/2006  . GOUT 12/28/2006  . HYPERTENSION 12/28/2006  . CORONARY ARTERY DISEASE 12/28/2006  . CLAUDICATION 05/14/2007  . RENAL FAILURE, CHRONIC 09/15/2008  . LATERAL EPICONDYLITIS, RIGHT 12/11/2009  . GERD (gastroesophageal reflux disease)   . Erosive gastritis   . Duodenal ulcer   . Hiatal hernia   . Hemorrhoids     History   Social History  . Marital Status: Married    Spouse Name: N/A    Number of Children: N/A  . Years of Education: N/A   Occupational History  . retired    Social History Main Topics  . Smoking status: Never Smoker   . Smokeless tobacco: Former Neurosurgeon    Types: Chew    Quit date: 07/09/1988  . Alcohol Use: 0.6 oz/week    1 Glasses of wine per week  . Drug Use: No  . Sexual Activity: Not on file   Other Topics Concern  . Not on file   Social History Narrative  . No narrative on file    Past Surgical History  Procedure Laterality Date  . Coronary artery bypass graft    . Colonoscopy    . Knee arthroscopy  2012    Family History  Problem Relation Age of Onset  . Diabetes Mother   . Hypertension Mother   . Stroke Father     Allergies  Allergen Reactions  . Penicillins     REACTION: urticaria (hives)  . Tetracycline Hcl     REACTION: urticaria (hives)    Current Outpatient Prescriptions on File Prior to Visit  Medication Sig Dispense Refill  . aspirin 81 MG tablet Take 81 mg by mouth daily.        Marland Kitchen atorvastatin (LIPITOR) 40 MG tablet TAKE ONE TABLET BY MOUTH EVERY DAY  90 tablet  1  . colchicine (COLCRYS) 0.6 MG tablet Take 0.6 mg by mouth daily.      .  cyclobenzaprine (FLEXERIL) 10 MG tablet TAKE TWO  TABLET AS NEEDE      . fluticasone (FLONASE) 50 MCG/ACT nasal spray Place 2 sprays into the nose daily. For 10 days  16 g  5  . glimepiride (AMARYL) 1 MG tablet TAKE ONE TABLET BY MOUTH ONCE DAILY BEFORE  BREAKFAST  30 tablet  5  . lisinopril (PRINIVIL,ZESTRIL) 40 MG tablet TAKE ONE TABLET BY MOUTH EVERY DAY  90 tablet  1  . metoprolol succinate (TOPROL-XL) 100 MG 24 hr tablet TAKE 1 TABLET DAILY  90 tablet  PRN  . NIFEdipine (PROCARDIA-XL) 30 MG (OSM) 24 hr tablet Take 30 mg by mouth 2 (two) times daily.        Marland Kitchen NOVOLIN 70/30 RELION (70-30) 100 UNIT/ML injection INJECT 20 UNITS SUBCUTANEOUSLY TWICE DAILY  3 mL  5  . pioglitazone (ACTOS) 30 MG tablet TAKE ONE TABLET BY MOUTH EVERY DAY  90 tablet  1   No current facility-administered medications on file prior to visit.     patient denies chest pain, shortness of breath, orthopnea. Denies lower extremity edema, abdominal pain, change in appetite, change in bowel movements.  Patient denies rashes, musculoskeletal complaints. No other specific complaints in a complete review of systems.   BP 104/54  Pulse 76  Temp(Src) 98.6 F (37 C) (Oral)  Ht 6' (1.829 m)  Wt 200 lb (90.719 kg)  BMI 27.12 kg/m2  well-developed well-nourished male in no acute distress. HEENT exam atraumatic, normocephalic, neck supple without jugular venous distention. Chest clear to auscultation cardiac exam S1-S2 are regular. Abdominal exam overweight with bowel sounds, soft and nontender. Extremities no edema. Neurologic exam is alert with a normal gait. Musculoskeletal: Patient has normal strength in both shoulders. He has normal range of motion in both shoulders. He has obvious discomfort with active movement of the shoulder. Less pain with passive movement.  Assessment and plan: Shoulder pain: By examination he doesn't have rotator cuff tendinitis. I think he needs further evaluation. I'll refer to orthopedics.  DIABETES  MELLITUS, TYPE II Will check laboratory work today.  HYPERLIPIDEMIA Previously controlled. Continue current medications.  HYPERTENSION BP Readings from Last 3 Encounters:  06/21/13 104/54  01/26/13 122/66  07/06/12 114/64   Controlled. Continue current medications.

## 2013-06-21 NOTE — Progress Notes (Signed)
Pre visit review using our clinic review tool, if applicable. No additional management support is needed unless otherwise documented below in the visit note. 

## 2013-06-22 NOTE — Assessment & Plan Note (Signed)
Previously controlled. Continue current medications. 

## 2013-06-22 NOTE — Assessment & Plan Note (Signed)
Will check laboratory work today. 

## 2013-06-22 NOTE — Assessment & Plan Note (Signed)
BP Readings from Last 3 Encounters:  06/21/13 104/54  01/26/13 122/66  07/06/12 114/64   Controlled. Continue current medications.

## 2013-06-24 ENCOUNTER — Other Ambulatory Visit: Payer: Self-pay | Admitting: Internal Medicine

## 2013-06-29 ENCOUNTER — Ambulatory Visit: Payer: Medicare Other | Admitting: Internal Medicine

## 2013-07-06 DIAGNOSIS — M25579 Pain in unspecified ankle and joints of unspecified foot: Secondary | ICD-10-CM | POA: Diagnosis not present

## 2013-07-06 DIAGNOSIS — M25819 Other specified joint disorders, unspecified shoulder: Secondary | ICD-10-CM | POA: Diagnosis not present

## 2013-07-06 DIAGNOSIS — H2510 Age-related nuclear cataract, unspecified eye: Secondary | ICD-10-CM | POA: Diagnosis not present

## 2013-07-21 ENCOUNTER — Other Ambulatory Visit: Payer: Self-pay | Admitting: Internal Medicine

## 2013-08-03 DIAGNOSIS — M25579 Pain in unspecified ankle and joints of unspecified foot: Secondary | ICD-10-CM | POA: Diagnosis not present

## 2013-10-05 ENCOUNTER — Other Ambulatory Visit: Payer: Self-pay | Admitting: Internal Medicine

## 2013-10-12 ENCOUNTER — Other Ambulatory Visit: Payer: Self-pay | Admitting: Internal Medicine

## 2013-10-21 ENCOUNTER — Other Ambulatory Visit: Payer: Self-pay | Admitting: Internal Medicine

## 2013-10-24 ENCOUNTER — Other Ambulatory Visit: Payer: Self-pay | Admitting: Internal Medicine

## 2013-11-28 DIAGNOSIS — E119 Type 2 diabetes mellitus without complications: Secondary | ICD-10-CM | POA: Diagnosis not present

## 2013-11-28 DIAGNOSIS — G5710 Meralgia paresthetica, unspecified lower limb: Secondary | ICD-10-CM | POA: Diagnosis not present

## 2013-11-28 DIAGNOSIS — I10 Essential (primary) hypertension: Secondary | ICD-10-CM | POA: Diagnosis not present

## 2013-11-28 DIAGNOSIS — N183 Chronic kidney disease, stage 3 unspecified: Secondary | ICD-10-CM | POA: Diagnosis not present

## 2014-01-18 ENCOUNTER — Telehealth: Payer: Self-pay | Admitting: Internal Medicine

## 2014-01-18 DIAGNOSIS — E119 Type 2 diabetes mellitus without complications: Secondary | ICD-10-CM

## 2014-01-18 NOTE — Telephone Encounter (Signed)
Please advise if okay to check labs

## 2014-01-18 NOTE — Telephone Encounter (Signed)
Pt sent a message thru mychart and  wants to get hemoglobin A1C can this be scheduled . I did let him know that he need to call and get establish with a new provider .

## 2014-01-22 NOTE — Telephone Encounter (Signed)
Yes do A1C

## 2014-01-23 NOTE — Telephone Encounter (Signed)
Future order placed, please schedule 

## 2014-01-26 NOTE — Telephone Encounter (Signed)
Pt scheduled  

## 2014-01-31 ENCOUNTER — Other Ambulatory Visit (INDEPENDENT_AMBULATORY_CARE_PROVIDER_SITE_OTHER): Payer: Medicare Other

## 2014-01-31 DIAGNOSIS — E119 Type 2 diabetes mellitus without complications: Secondary | ICD-10-CM

## 2014-01-31 LAB — HEMOGLOBIN A1C: Hgb A1c MFr Bld: 7.5 % — ABNORMAL HIGH (ref 4.6–6.5)

## 2014-02-17 ENCOUNTER — Encounter: Payer: Self-pay | Admitting: Cardiovascular Disease

## 2014-02-17 ENCOUNTER — Ambulatory Visit (INDEPENDENT_AMBULATORY_CARE_PROVIDER_SITE_OTHER): Payer: Medicare Other | Admitting: Cardiovascular Disease

## 2014-02-17 VITALS — BP 110/60 | HR 56 | Ht 72.0 in | Wt 202.0 lb

## 2014-02-17 DIAGNOSIS — E785 Hyperlipidemia, unspecified: Secondary | ICD-10-CM | POA: Diagnosis not present

## 2014-02-17 DIAGNOSIS — I2510 Atherosclerotic heart disease of native coronary artery without angina pectoris: Secondary | ICD-10-CM

## 2014-02-17 NOTE — Patient Instructions (Signed)
Your physician wants you to follow-up in: 1 YEAR with Dr Cooper.  You will receive a reminder letter in the mail two months in advance. If you don't receive a letter, please call our office to schedule the follow-up appointment.  Your physician recommends that you continue on your current medications as directed. Please refer to the Current Medication list given to you today.  

## 2014-02-17 NOTE — Progress Notes (Signed)
HPI:   73 year old gentleman presenting for followup evaluation. The patient has coronary artery disease status post CABG in 1997. He has peripheral arterial disease with bilateral SFA occlusions. When I saw him in 2013 he complained of exertional dyspnea and chest discomfort. Her nuclear stress scan was done and demonstrated normal perfusion with a left ventricular ejection fraction of 60%.  Last lipids from December 2014: Lipid Panel     Component Value Date/Time   CHOL 141 06/21/2013 1004   TRIG 71.0 06/21/2013 1004   HDL 41.60 06/21/2013 1004   CHOLHDL 3 06/21/2013 1004   VLDL 14.2 06/21/2013 1004   LDLCALC 85 06/21/2013 1004   The patient is doing well at present. He denies chest pain, chest pressure, shortness of breath, edema, or heart palpitations. He's continued to have some problems with gout in his right foot, but this is been better than it was when I saw him last year. He continues on colchicine. He occasionally feels fatigued with exercise, but has no other specific complaints.  Outpatient Encounter Prescriptions as of 02/17/2014  Medication Sig  . aspirin 81 MG tablet Take 81 mg by mouth daily.    Marland Kitchen atorvastatin (LIPITOR) 40 MG tablet TAKE ONE TABLET BY MOUTH ONCE DAILY  . chlorthalidone (HYGROTON) 25 MG tablet TAKE ONE TABLET BY MOUTH ONCE DAILY  . clindamycin (CLEOCIN) 300 MG capsule Take 300 mg by mouth 3 (three) times daily.  . colchicine (COLCRYS) 0.6 MG tablet Take 0.6 mg by mouth daily.  . fexofenadine (ALLEGRA) 180 MG tablet Take 180 mg by mouth daily.  . fluticasone (FLONASE) 50 MCG/ACT nasal spray USE TWO SPRAY IN EACH NOSTRIL EVERY DAY FOR 10 DAYS  . glimepiride (AMARYL) 1 MG tablet TAKE ONE TABLET BY MOUTH ONCE DAILY BEFORE BREAKFAST  . glucose 4 GM chewable tablet Chew 1 tablet by mouth as needed for low blood sugar.  . HYDROcodone-acetaminophen (NORCO/VICODIN) 5-325 MG per tablet   . lisinopril (PRINIVIL,ZESTRIL) 40 MG tablet TAKE ONE TABLET BY MOUTH  EVERY DAY  . metoprolol succinate (TOPROL-XL) 100 MG 24 hr tablet TAKE 1 TABLET EVERY DAY  . NIFEdipine (PROCARDIA-XL) 30 MG (OSM) 24 hr tablet Take 30 mg by mouth 2 (two) times daily.    Marland Kitchen NOVOLIN 70/30 RELION (70-30) 100 UNIT/ML injection INJECT 20 UNITS SUBCUTANEOUSLY TWICE DAILY  . oxymetazoline (AFRIN) 0.05 % nasal spray Place 1 spray into both nostrils 2 (two) times daily.  . pioglitazone (ACTOS) 30 MG tablet TAKE ONE TABLET BY MOUTH ONCE DAILY  . [DISCONTINUED] cyclobenzaprine (FLEXERIL) 10 MG tablet TAKE TWO  TABLET AS NEEDE    Allergies  Allergen Reactions  . Penicillins Hives    REACTION: urticaria (hives)  . Tetracycline Hcl     REACTION: urticaria (hives)    Past Medical History  Diagnosis Date  . DIABETES MELLITUS, TYPE II 12/28/2006  . HYPERLIPIDEMIA 12/28/2006  . GOUT 12/28/2006  . HYPERTENSION 12/28/2006  . CORONARY ARTERY DISEASE 12/28/2006  . CLAUDICATION 05/14/2007  . RENAL FAILURE, CHRONIC 09/15/2008  . LATERAL EPICONDYLITIS, RIGHT 12/11/2009  . GERD (gastroesophageal reflux disease)   . Erosive gastritis   . Duodenal ulcer   . Hiatal hernia   . Hemorrhoids    ROS: Negative except as per HPI  BP 110/60  Pulse 56  Ht 6' (1.829 m)  Wt 202 lb (91.627 kg)  BMI 27.39 kg/m2  PHYSICAL EXAM: Pt is alert and oriented, NAD HEENT: normal Neck: JVP - normal, carotids 2+= without bruits Lungs: CTA  bilaterally CV: RRR without murmur or gallop Abd: soft, NT, Positive BS, no hepatomegaly Ext: Trace pretibial edema bilaterally Skin: warm/dry no rash  EKG:  Sinus bradycardia 56 beats per minute, possible left atrial enlargement, otherwise within normal limits.  ASSESSMENT AND PLAN: 1. CAD status post CABG. The patient is stable on his current medical therapy. He is approaching 20 years out from CABG. His most recent nuclear scan was within normal limits with no ischemia demonstrated. He will continue on his current cardiac medications which include aspirin,  metoprolol, lisinopril, and atorvastatin.   2. Peripheral arterial disease. He has known bilateral SFA occlusions. Continue current medical therapy. No significant claudication symptoms at present.  3. Hypertension. Blood pressure is well controlled on multidrug therapy  4. Hyperlipidemia. On high-intensity statin with lipitor 40 mg. lipids reviewed as outlined above.  For followup I will see him back in one year unless problems arise in the interim. He will establish with Dr Yong Channel for [place] in October.  Sherren Mocha MD 02/17/2014 10:12 AM

## 2014-03-02 DIAGNOSIS — M7710 Lateral epicondylitis, unspecified elbow: Secondary | ICD-10-CM | POA: Diagnosis not present

## 2014-03-09 ENCOUNTER — Other Ambulatory Visit: Payer: Self-pay | Admitting: Internal Medicine

## 2014-03-20 DIAGNOSIS — M25529 Pain in unspecified elbow: Secondary | ICD-10-CM | POA: Diagnosis not present

## 2014-03-23 DIAGNOSIS — M25529 Pain in unspecified elbow: Secondary | ICD-10-CM | POA: Diagnosis not present

## 2014-03-29 DIAGNOSIS — M25529 Pain in unspecified elbow: Secondary | ICD-10-CM | POA: Diagnosis not present

## 2014-03-29 DIAGNOSIS — M25629 Stiffness of unspecified elbow, not elsewhere classified: Secondary | ICD-10-CM | POA: Diagnosis not present

## 2014-03-31 DIAGNOSIS — M25522 Pain in left elbow: Secondary | ICD-10-CM | POA: Diagnosis not present

## 2014-04-03 DIAGNOSIS — M25522 Pain in left elbow: Secondary | ICD-10-CM | POA: Diagnosis not present

## 2014-04-06 DIAGNOSIS — M25622 Stiffness of left elbow, not elsewhere classified: Secondary | ICD-10-CM | POA: Diagnosis not present

## 2014-04-06 DIAGNOSIS — M7712 Lateral epicondylitis, left elbow: Secondary | ICD-10-CM | POA: Diagnosis not present

## 2014-04-06 DIAGNOSIS — M25522 Pain in left elbow: Secondary | ICD-10-CM | POA: Diagnosis not present

## 2014-04-11 ENCOUNTER — Encounter: Payer: Self-pay | Admitting: Family Medicine

## 2014-04-12 ENCOUNTER — Ambulatory Visit (INDEPENDENT_AMBULATORY_CARE_PROVIDER_SITE_OTHER): Payer: Medicare Other | Admitting: Family Medicine

## 2014-04-12 ENCOUNTER — Encounter: Payer: Self-pay | Admitting: Family Medicine

## 2014-04-12 VITALS — BP 122/72 | HR 60 | Temp 98.1°F | Wt 202.0 lb

## 2014-04-12 DIAGNOSIS — E785 Hyperlipidemia, unspecified: Secondary | ICD-10-CM | POA: Diagnosis not present

## 2014-04-12 DIAGNOSIS — Z23 Encounter for immunization: Secondary | ICD-10-CM | POA: Diagnosis not present

## 2014-04-12 DIAGNOSIS — N401 Enlarged prostate with lower urinary tract symptoms: Secondary | ICD-10-CM | POA: Diagnosis not present

## 2014-04-12 DIAGNOSIS — I10 Essential (primary) hypertension: Secondary | ICD-10-CM

## 2014-04-12 DIAGNOSIS — E119 Type 2 diabetes mellitus without complications: Secondary | ICD-10-CM | POA: Diagnosis not present

## 2014-04-12 DIAGNOSIS — R351 Nocturia: Secondary | ICD-10-CM

## 2014-04-12 DIAGNOSIS — I2510 Atherosclerotic heart disease of native coronary artery without angina pectoris: Secondary | ICD-10-CM

## 2014-04-12 NOTE — Assessment & Plan Note (Signed)
Continue atorvastatin as well controlled

## 2014-04-12 NOTE — Assessment & Plan Note (Signed)
I think A1c goal of less than 7.5 as reasonable. At goal. Continue quinapril 1 mg, Actos which I counseled patient on risks about, 7030 insulin twice a day 20 units

## 2014-04-12 NOTE — Progress Notes (Signed)
Garret Reddish, MD Phone: [phone]  Subjective:  Patient presents today to establish care with me as their new primary care provider. Patient was formerly a patient of Dr. Leanne Chang. Chief complaint-noted.   DIABETES Type II-reasonable control  Lab Results  Component Value Date   HGBA1C 7.5* 01/31/2014   HGBA1C 7.8* 06/21/2013   HGBA1C 8.0* 02/11/2013  Medications taking and tolerating-yes, Actos 30mg , insulin 70/30 20 units BID, amaryl 1mg  Blood Sugars per patient-fasting- 110 in mornin, later in the day in 90s.  Once a month low overnight-into 60s but patient always wakes up.  Regular Exercise-tends to get gout flares On Aspirin-yes On statin-yes Daily foot monitoring-yes  ROS- Denies  Vision changes, feet or hand numbness/pain/tingling. Endorses Hypoglycemia symptoms once a month above (shaky, sweaty, hungry, weak anxious, tremor, palpitations, confusion, behavior change). Nocturia twice a night likely due to BPH  Hypertension-well-controlled BP Readings from Last 3 Encounters:  04/12/14 122/72  02/17/14 110/60  06/21/13 104/54  Compliant with medications-yes without side effects ROS-Denies any CP, HA, SOB, blurry vision.   Hyperlipidemia-controlled Lab Results  Component Value Date   LDLCALC 85 06/21/2013  On statin: Lipitor 40 mg daily Regular exercise: No ROS- no chest pain or shortness of brea units of th. No myalgias  Diagnosed with tennis elbow by orthopedist. Wearing brace. Going to PT-using TENS. Had injection that didn't help. Dr. Verda Cumins  The following were reviewed and entered/updated in epic: Past Medical History  Diagnosis Date  . DIABETES MELLITUS, TYPE II 12/28/2006  . HYPERLIPIDEMIA 12/28/2006  . GOUT 12/28/2006  . HYPERTENSION 12/28/2006  . CORONARY ARTERY DISEASE 12/28/2006  . CLAUDICATION 05/14/2007  . RENAL FAILURE, CHRONIC 09/15/2008  . LATERAL EPICONDYLITIS, RIGHT 12/11/2009  . GERD (gastroesophageal reflux disease)   . Erosive gastritis     . Duodenal ulcer   . Hiatal hernia   . Hemorrhoids    Patient Active Problem List   Diagnosis Date Noted  . Peripheral vascular disease-claudicatoin 05/14/2007    Priority: High  . Diabetes mellitus type II, controlled 12/28/2006    Priority: High  .  Coronary artery disease s/p CABG 1997 12/28/2006    Priority: High  . Erectile dysfunction 08/13/2010    Priority: Medium  . 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 Hyperlipidemia 12/28/2006    Priority: Medium  . GOUT 12/28/2006    Priority: Medium  . Essential hypertension 12/28/2006    Priority: Medium  . Pulmonary nodule seen on imaging study 05/17/2012    Priority: Low   Past Surgical History  Procedure Laterality Date  . Coronary artery bypass graft    . Colonoscopy    . Knee arthroscopy  2012    Family History  Problem Relation Age of Onset  . Diabetes Mother   . Hypertension Mother   . Stroke Father     Medications- reviewed and updated Current Outpatient Prescriptions  Medication Sig Dispense Refill  . aspirin 81 MG tablet Take 81 mg by mouth daily.        Marland Kitchen atorvastatin (LIPITOR) 40 MG tablet TAKE ONE TABLET BY MOUTH ONCE DAILY  90 tablet  0  . chlorthalidone (HYGROTON) 25 MG tablet TAKE ONE TABLET BY MOUTH ONCE DAILY  90 tablet  2  . colchicine (COLCRYS) 0.6 MG tablet Take 0.6 mg by mouth daily.      . fexofenadine (ALLEGRA) 180 MG tablet Take 180 mg by mouth daily.      . fluticasone (FLONASE)  50 MCG/ACT nasal spray USE TWO SPRAY IN EACH NOSTRIL EVERY DAY FOR 10 DAYS  16 g  5  . glimepiride (AMARYL) 1 MG tablet TAKE ONE TABLET BY MOUTH ONCE DAILY BEFORE  BREAKFAST  30 tablet  0  . glucose 4 GM chewable tablet Chew 1 tablet by mouth as needed for low blood sugar.      . lisinopril (PRINIVIL,ZESTRIL) 40 MG tablet TAKE ONE TABLET BY MOUTH ONCE DAILY  90 tablet  0  . metoprolol succinate (TOPROL-XL) 100 MG 24 hr tablet TAKE 1 TABLET EVERY DAY  90 tablet  3  . NIFEdipine  (PROCARDIA-XL) 30 MG (OSM) 24 hr tablet Take 30 mg by mouth 2 (two) times daily.        Marland Kitchen NOVOLIN 70/30 RELION (70-30) 100 UNIT/ML injection INJECT 20 UNITS SUBCUTANEOUSLY TWICE DAILY  3 mL  5  . oxymetazoline (AFRIN) 0.05 % nasal spray Place 1 spray into both nostrils 2 (two) times daily.      . pioglitazone (ACTOS) 30 MG tablet TAKE ONE TABLET BY MOUTH ONCE DAILY  90 tablet  1   No current facility-administered medications for this visit.    Allergies-reviewed and updated Allergies  Allergen Reactions  . Penicillins Hives    REACTION: urticaria (hives)  . Tetracycline Hcl     REACTION: urticaria (hives)    History   Social History  . Marital Status: Married    Spouse Name: N/A    Number of Children: N/A  . Years of Education: N/A   Occupational History  . retired    Social History Main Topics  . Smoking status: Never Smoker   . Smokeless tobacco: Former Systems developer    Types: [place] date: 07/09/1988  . Alcohol Use: 0.6 oz/week    1 Glasses of wine per week  . Drug Use: No  . Sexual Activity: Not on file   Other Topics Concern  . Not on file   Social History Narrative   Married 1983. 2 sons. No grandkids yet.       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       Hobbies: volunteer work, watch sports    ROS--See HPI   Objective: BP 122/72  Pulse 60  Temp(Src) 98.1 F (36.7 C)  Wt 202 lb (91.627 kg) Gen: NAD, resting comfortably in chair, moves well to table without assist HEENT: Mucous membranes are moist. Oropharynx normal. Has several caps.  CV: RRR no murmurs rubs or gallops Lungs: CTAB no crackles, wheeze, rhonchi Abdomen: soft/nontender/nondistended/normal bowel sounds.  Ext: 1+ edema Skin: warm, dry, no rash Neuro: grossly normal, moves all extremities, PERRLA   Assessment/Plan:  Diabetes mellitus type II, controlled I think A1c goal of less than 7.5 as reasonable. At goal.  Continue quinapril 1 mg, Actos which I counseled patient on risks about, 7030 insulin twice a day 20 units  Essential hypertension Continue Chlorthalidone, lisinopril 40, metoprolol 100mg , nifedipine as well controlled  Hyperlipidemia Continue atorvastatin as well controlled   Future fasting labs Orders Placed This Encounter  Procedures  . CBC    [place]    Standing Status: Future     Number of Occurrences:      Standing Expiration Date: 06/29/2014  . Comprehensive metabolic panel    [place]    Standing Status: Future     Number of Occurrences:      Standing Expiration Date: 06/29/2014  . Lipid panel  [place]    Standing Status: Future     Number of Occurrences:      Standing Expiration Date: 06/29/2014  . Hemoglobin A1c    [place]    Standing Status: Future     Number of Occurrences:      Standing Expiration Date: 06/29/2014  . TSH    [place]    Standing Status: Future     Number of Occurrences:      Standing Expiration Date: 06/29/2014  . PSA    Standing Status: Future     Number of Occurrences:      Standing Expiration Date: 06/29/2014

## 2014-04-12 NOTE — Patient Instructions (Addendum)
Foot exam next visit  Diabetes is reasonable controlled, goal is to stay 7.5 or less.   BP looks great  Cholesterol looks great   See me in late November or early December and come in a week before to check a1c and update other labs  Flu shot today

## 2014-04-12 NOTE — Assessment & Plan Note (Signed)
Continue Chlorthalidone, lisinopril 40, metoprolol 100mg , nifedipine as well controlled

## 2014-05-15 ENCOUNTER — Other Ambulatory Visit: Payer: Self-pay | Admitting: Internal Medicine

## 2014-06-05 ENCOUNTER — Other Ambulatory Visit (INDEPENDENT_AMBULATORY_CARE_PROVIDER_SITE_OTHER): Payer: Medicare Other

## 2014-06-05 DIAGNOSIS — N401 Enlarged prostate with lower urinary tract symptoms: Secondary | ICD-10-CM | POA: Diagnosis not present

## 2014-06-05 DIAGNOSIS — E785 Hyperlipidemia, unspecified: Secondary | ICD-10-CM | POA: Diagnosis not present

## 2014-06-05 DIAGNOSIS — E119 Type 2 diabetes mellitus without complications: Secondary | ICD-10-CM

## 2014-06-05 DIAGNOSIS — R351 Nocturia: Secondary | ICD-10-CM | POA: Diagnosis not present

## 2014-06-05 DIAGNOSIS — I2510 Atherosclerotic heart disease of native coronary artery without angina pectoris: Secondary | ICD-10-CM

## 2014-06-05 LAB — LIPID PANEL
CHOL/HDL RATIO: 4
Cholesterol: 164 mg/dL (ref 0–200)
HDL: 45.1 mg/dL (ref >39.00)
LDL CALC: 104 mg/dL — AB (ref 0–99)
NonHDL: 118.9
Triglycerides: 74 mg/dL (ref 0.0–149.0)
VLDL: 14.8 mg/dL (ref 0.0–40.0)

## 2014-06-05 LAB — COMPREHENSIVE METABOLIC PANEL
ALT: 17 U/L (ref 0–53)
AST: 18 U/L (ref 0–37)
Albumin: 3.9 g/dL (ref 3.5–5.2)
Alkaline Phosphatase: 61 U/L (ref 39–117)
BUN: 24 mg/dL — ABNORMAL HIGH (ref 6–23)
CO2: 30 mEq/L (ref 19–32)
CREATININE: 1.6 mg/dL — AB (ref 0.4–1.5)
Calcium: 9.7 mg/dL (ref 8.4–10.5)
Chloride: 105 mEq/L (ref 96–112)
GFR: 53.79 mL/min — ABNORMAL LOW (ref >60.00)
Glucose, Bld: 110 mg/dL — ABNORMAL HIGH (ref 70–99)
Potassium: 5.4 mEq/L — ABNORMAL HIGH (ref 3.5–5.1)
Sodium: 141 mEq/L (ref 135–145)
Total Bilirubin: 0.6 mg/dL (ref 0.2–1.2)
Total Protein: 7.5 g/dL (ref 6.0–8.3)

## 2014-06-05 LAB — CBC
HCT: 46.6 % (ref 39.0–52.0)
HEMOGLOBIN: 15.1 g/dL (ref 13.0–17.0)
MCHC: 32.3 g/dL (ref 30.0–36.0)
MCV: 88.3 fl (ref 78.0–100.0)
Platelets: 235 10*3/uL (ref 150.0–400.0)
RBC: 5.28 Mil/uL (ref 4.22–5.81)
RDW: 13.6 % (ref 11.5–15.5)
WBC: 5.9 10*3/uL (ref 4.0–10.5)

## 2014-06-05 LAB — TSH: TSH: 1.72 u[IU]/mL (ref 0.35–4.50)

## 2014-06-05 LAB — HEMOGLOBIN A1C: Hgb A1c MFr Bld: 8.7 % — ABNORMAL HIGH (ref 4.6–6.5)

## 2014-06-05 LAB — PSA: PSA: 1.06 ng/mL (ref 0.10–4.00)

## 2014-06-12 ENCOUNTER — Ambulatory Visit (INDEPENDENT_AMBULATORY_CARE_PROVIDER_SITE_OTHER): Payer: Medicare Other | Admitting: Family Medicine

## 2014-06-12 ENCOUNTER — Encounter: Payer: Self-pay | Admitting: Family Medicine

## 2014-06-12 VITALS — BP 140/72 | Temp 98.3°F | Wt 206.0 lb

## 2014-06-12 DIAGNOSIS — M7712 Lateral epicondylitis, left elbow: Secondary | ICD-10-CM

## 2014-06-12 DIAGNOSIS — E119 Type 2 diabetes mellitus without complications: Secondary | ICD-10-CM | POA: Diagnosis not present

## 2014-06-12 DIAGNOSIS — E785 Hyperlipidemia, unspecified: Secondary | ICD-10-CM

## 2014-06-12 DIAGNOSIS — E875 Hyperkalemia: Secondary | ICD-10-CM | POA: Diagnosis not present

## 2014-06-12 DIAGNOSIS — I10 Essential (primary) hypertension: Secondary | ICD-10-CM | POA: Diagnosis not present

## 2014-06-12 LAB — BASIC METABOLIC PANEL
BUN: 32 mg/dL — AB (ref 6–23)
CALCIUM: 9.3 mg/dL (ref 8.4–10.5)
CO2: 28 meq/L (ref 19–32)
Chloride: 106 mEq/L (ref 96–112)
Creatinine, Ser: 1.6 mg/dL — ABNORMAL HIGH (ref 0.4–1.5)
GFR: 53.41 mL/min — ABNORMAL LOW (ref >60.00)
GLUCOSE: 72 mg/dL (ref 70–99)
Potassium: 4.9 mEq/L (ref 3.5–5.1)
Sodium: 139 mEq/L (ref 135–145)

## 2014-06-12 NOTE — Assessment & Plan Note (Signed)
BP mildly elevated but previously well controlled. Avoid decongestants.

## 2014-06-12 NOTE — Patient Instructions (Addendum)
Increase your morning insulin to 21 units, if you do not have any lows within 2 weeks, increase to 22 units.   Recheck a1c in 3 months and see me a week later.   Hopeful next a1c at LEAST below 8.   Foot exam normal today.   Recheck cholesterol at that time  Referred you to Dr. Mayer Camel for his opinion on your elbow.   Health Maintenance Due  Topic Date Due  . OPHTHALMOLOGY EXAM  05/03/2014

## 2014-06-12 NOTE — Progress Notes (Signed)
Matthew Reddish, MD Phone: [phone]  Subjective:   Matthew [name] is a 73 y.o. year old very pleasant male patient who presents with the following:  DIABETES Type II-worsening control  Lab Results  Component Value Date   HGBA1C 8.7* 06/05/2014   HGBA1C 7.5* 01/31/2014   HGBA1C 7.8* 06/21/2013  Medications taking and tolerating-yes, amaryl 1mg , actos 30mg , relion 70/30 20 units twice a day Blood Sugars per patient-fasting- 130s to 150s in the AM typically but has had some into the 70s. In the evening has been getting 105-110.  Regular Exercise-not exercising due to recent cold.  On Aspirin-yes On statin-yes Daily foot monitoring-yes Health Maintenance Due  Topic Date Due  . FOOT EXAM - today 07/06/2013  . OPHTHALMOLOGY EXAM -goes early next year 05/03/2014   ROS- Denies incresed polyuria,Polydipsia, nocturia (2x night baseline for years). Denies Vision changes, feet or hand numbness/pain/tingling. Denies Hypoglycemia symptoms (shaky, sweaty, hungry, weak anxious, tremor, palpitations, confusion, behavior change).   4.5 weeks had a cold with green mucus production from nose and coughing up the same. After 2 weeks of symptoms wondering if this was the flu. Tried acetaminophen. OTC cough medicines ran blood sugar up and also took sudafed. Mild dry cough persists. Was eating poorly during this time.   Also with hyperkalemia on recent labs with plan for repeat.   Past Medical History- Patient Active Problem List   Diagnosis Date Noted  . Peripheral vascular disease-claudicatoin 05/14/2007    Priority: High  . Diabetes mellitus type II, controlled 12/28/2006    Priority: High  .  Coronary artery disease s/p CABG 1997 12/28/2006    Priority: High  . Erectile dysfunction 08/13/2010    Priority: Medium  . 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 Hyperlipidemia 12/28/2006    Priority: Medium  . GOUT 12/28/2006    Priority: Medium  . Essential  hypertension 12/28/2006    Priority: Medium  . Pulmonary nodule seen on imaging study 05/17/2012    Priority: Low   Medications- reviewed and updated Current Outpatient Prescriptions  Medication Sig Dispense Refill  . aspirin 81 MG tablet Take 81 mg by mouth daily.      Marland Kitchen atorvastatin (LIPITOR) 40 MG tablet TAKE ONE TABLET BY MOUTH ONCE DAILY 90 tablet 0  . chlorthalidone (HYGROTON) 25 MG tablet TAKE ONE TABLET BY MOUTH ONCE DAILY 90 tablet 2  . colchicine (COLCRYS) 0.6 MG tablet Take 0.6 mg by mouth daily.    . fexofenadine (ALLEGRA) 180 MG tablet Take 180 mg by mouth daily.    . fluticasone (FLONASE) 50 MCG/ACT nasal spray USE TWO SPRAY IN EACH NOSTRIL EVERY DAY FOR 10 DAYS 16 g 5  . glimepiride (AMARYL) 1 MG tablet TAKE ONE TABLET BY MOUTH ONCE DAILY BEFORE BREAKFAST 30 tablet 0  . glucose 4 GM chewable tablet Chew 1 tablet by mouth as needed for low blood sugar.    . lisinopril (PRINIVIL,ZESTRIL) 40 MG tablet TAKE ONE TABLET BY MOUTH ONCE DAILY 90 tablet 0  . metoprolol succinate (TOPROL-XL) 100 MG 24 hr tablet TAKE 1 TABLET EVERY DAY 90 tablet 3  . NIFEdipine (PROCARDIA-XL) 30 MG (OSM) 24 hr tablet Take 30 mg by mouth 2 (two) times daily.      Marland Kitchen NOVOLIN 70/30 RELION (70-30) 100 UNIT/ML injection. Increased to 22, 20 units after visit.  INJECT 20 UNITS SUBCUTANEOUSLY TWICE A DAY 15 mL 0  . oxymetazoline (AFRIN) 0.05 % nasal spray Place 1  spray into both nostrils 2 (two) times daily.    . pioglitazone (ACTOS) 30 MG tablet TAKE ONE TABLET BY MOUTH ONCE DAILY 90 tablet 1   Objective: BP 140/72 mmHg  Temp(Src) 98.3 F (36.8 C)  Wt 206 lb (93.441 kg) Gen: NAD, resting comfortably MMM, intermittent cough, oropharynx normal, turbinates mildly swollen, sinuses minimal tenderness CV: RRR no murmurs rubs or gallops Lungs: CTAB no crackles, wheeze, rhonchi Abdomen: soft/nontender/nondistended/normal bowel sounds.  Skin: warm, dry, no rash MSK hypopigmentation left elbow and lateral  epicondyle DM foot exam normal   Results for orders placed or performed in visit on 06/12/14 (from the past 24 hour(s))  Basic metabolic panel     Status: Abnormal   Collection Time: 06/12/14 10:45 AM  Result Value Ref Range   Sodium 139 135 - 145 mEq/L   Potassium 4.9 3.5 - 5.1 mEq/L   Chloride 106 96 - 112 mEq/L   CO2 28 19 - 32 mEq/L   Glucose, Bld 72 70 - 99 mg/dL   BUN 32 (H) 6 - 23 mg/dL   Creatinine, Ser 1.6 (H) 0.4 - 1.5 mg/dL   Calcium 9.3 8.4 - 10.5 mg/dL   GFR 53.41 (L) >60.00 mL/min   Assessment/Plan:  Diabetes mellitus type II, controlled Actos 30mg , insulin 70/30 20 units BID, amaryl 1mg  previously a1c <8. Worsened due to cold over 5 weeks around 05/2014 eating poorly and eating sugary cough syrup with a1c now at 8.7. Continue actos30mg  , amaryl 1mg , increase relion insulin to 22 units in AM and continue 20 units PM. Patients CBGs are better that expected with current a1c. Seems to be doing better from recent illness and will follow up if does not continue to improve.   Essential hypertension BP mildly elevated but previously well controlled. Avoid decongestants.   Bmet obtained due to hyperkalemia last visit. CKD stage II-III stable and hyperkalemia resolved-could have been hemolysis  [place] precautions advised.   BMET today with future a1c and LDL for 3 month follow up.  Orders Placed This Encounter  Procedures  . Basic metabolic panel    [place]  . Hemoglobin A1c    [place]    Standing Status: Future     Number of Occurrences:      Standing Expiration Date: 06/13/2015  . LDL cholesterol, direct    [place]    Standing Status: Future     Number of Occurrences:      Standing Expiration Date: 06/13/2015  . Ambulatory referral to Orthopedic Surgery    Referral Priority:  Routine    Referral Type:  Surgical    Referral Reason:  Specialty Services Required    Requested Specialty:  Orthopedic Surgery    Number of Visits Requested:  1  Patient also has a  history of tennis elbow issues in left elbow and has had PT and steroid injection. Wants opinion of Dr. Frederik Pear.

## 2014-06-12 NOTE — Assessment & Plan Note (Signed)
Actos 30mg , insulin 70/30 20 units BID, amaryl 1mg  previously a1c <8. Worsened due to cold over 5 weeks around 05/2014 eating poorly and eating sugary cough syrup with a1c now at 8.7. Continue actos30mg  , amaryl 1mg , increase relion insulin to 22 units in AM and continue 20 units PM. Patients CBGs are better that expected with current a1c. Seems to be doing better from recent illness and will follow up if does not continue to improve.

## 2014-06-13 DIAGNOSIS — M7712 Lateral epicondylitis, left elbow: Secondary | ICD-10-CM | POA: Diagnosis not present

## 2014-06-26 ENCOUNTER — Other Ambulatory Visit: Payer: Self-pay | Admitting: Internal Medicine

## 2014-06-26 MED ORDER — INSULIN NPH ISOPHANE & REGULAR (70-30) 100 UNIT/ML ~~LOC~~ SUSP: 20.0000 [IU] | Freq: Two times a day (BID) | SUBCUTANEOUS | Status: DC

## 2014-06-27 ENCOUNTER — Other Ambulatory Visit: Payer: Self-pay | Admitting: Orthopedic Surgery

## 2014-06-27 DIAGNOSIS — M25522 Pain in left elbow: Secondary | ICD-10-CM

## 2014-07-08 ENCOUNTER — Ambulatory Visit
Admission: RE | Admit: 2014-07-08 | Discharge: 2014-07-08 | Disposition: A | Discharge status: Home / Self Care | Payer: Medicare Other | Source: Ambulatory Visit | Attending: Orthopedic Surgery | Admitting: Orthopedic Surgery

## 2014-07-08 DIAGNOSIS — M25522 Pain in left elbow: Secondary | ICD-10-CM | POA: Diagnosis not present

## 2014-09-21 DIAGNOSIS — H10523 Angular blepharoconjunctivitis, bilateral: Secondary | ICD-10-CM | POA: Diagnosis not present

## 2014-09-21 DIAGNOSIS — H2513 Age-related nuclear cataract, bilateral: Secondary | ICD-10-CM | POA: Diagnosis not present

## 2014-09-21 LAB — HM DIABETES EYE EXAM

## 2014-09-24 ENCOUNTER — Encounter: Payer: Self-pay | Admitting: Family Medicine

## 2014-09-25 ENCOUNTER — Encounter: Payer: Self-pay | Admitting: Family Medicine

## 2014-10-04 ENCOUNTER — Encounter: Payer: Self-pay | Admitting: Family Medicine

## 2014-10-04 ENCOUNTER — Ambulatory Visit (INDEPENDENT_AMBULATORY_CARE_PROVIDER_SITE_OTHER): Payer: Medicare Other | Admitting: Family Medicine

## 2014-10-04 VITALS — BP 138/78 | HR 65 | Temp 98.2°F | Ht 72.0 in | Wt 208.6 lb

## 2014-10-04 DIAGNOSIS — Z23 Encounter for immunization: Secondary | ICD-10-CM | POA: Diagnosis not present

## 2014-10-04 DIAGNOSIS — M542 Cervicalgia: Secondary | ICD-10-CM | POA: Diagnosis not present

## 2014-10-04 MED ORDER — TIZANIDINE HCL 2 MG PO TABS: 2.0000 mg | ORAL_TABLET | Freq: Every day | ORAL | Status: DC

## 2014-10-04 NOTE — Patient Instructions (Signed)
BEFORE YOU LEAVE: -follow up in 2-3 weeks with your doctor -neck spasm exercises -prevnar 13  Heat for 15 minutes twice daily  Muscle relaxer at night for 5-7 days and as needed thereafter  Exercises provided 4 days per week as tolerated  Follow up with your doctor sooner if worsening, new symptoms or concerns

## 2014-10-04 NOTE — Progress Notes (Signed)
HPI:  Neck Pain: -woke up with crick in neck 3 days ago after falling asleep in his chair and slept there most of the night -pain is constant, muscle soreness, mild to moderate in R side of the neck in his SCM muscle -it has been improving  -has been taking tylenol - did not take today -denies: fevers, malaise, recent illness, HA, radiation of pain, weakness or numbness in the extremities -he does have seasonal allergies with nasal symptoms, sneezing and some popping and pressure in R ear  ROS: See pertinent positives and negatives per HPI.  Past Medical History  Diagnosis Date  . DIABETES MELLITUS, TYPE II 12/28/2006  . HYPERLIPIDEMIA 12/28/2006  . GOUT 12/28/2006  . HYPERTENSION 12/28/2006  . CORONARY ARTERY DISEASE 12/28/2006  . CLAUDICATION 05/14/2007  . RENAL FAILURE, CHRONIC 09/15/2008  . LATERAL EPICONDYLITIS, RIGHT 12/11/2009  . GERD (gastroesophageal reflux disease)   . Erosive gastritis   . Duodenal ulcer   . Hiatal hernia   . Hemorrhoids     Past Surgical History  Procedure Laterality Date  . Coronary artery bypass graft    . Colonoscopy    . Knee arthroscopy  2012    Family History  Problem Relation Age of Onset  . Diabetes Mother   . Hypertension Mother   . Stroke Father     History   Social History  . Marital Status: Married    Spouse Name: N/A  . Number of Children: N/A  . Years of Education: N/A   Occupational History  . retired    Social History Main Topics  . Smoking status: Never Smoker   . Smokeless tobacco: Former Systems developer    Types: Sour John date: 07/09/1988  . Alcohol Use: 0.6 oz/week    1 Glasses of wine per week  . Drug Use: No  . Sexual Activity: Not on file   Other Topics Concern  . None   Social History Narrative   Married 1983. 2 sons. No grandkids yet.       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       Hobbies: volunteer work, watch sports     Current  outpatient prescriptions:  .  aspirin 81 MG tablet, Take 81 mg by mouth daily.  , Disp: , Rfl:  .  atorvastatin (LIPITOR) 40 MG tablet, TAKE ONE TABLET BY MOUTH ONCE DAILY, Disp: 90 tablet, Rfl: 1 .  chlorthalidone (HYGROTON) 25 MG tablet, TAKE ONE TABLET BY MOUTH ONCE DAILY, Disp: 90 tablet, Rfl: 2 .  colchicine (COLCRYS) 0.6 MG tablet, Take 0.6 mg by mouth daily., Disp: , Rfl:  .  fexofenadine (ALLEGRA) 180 MG tablet, Take 180 mg by mouth daily., Disp: , Rfl:  .  fluticasone (FLONASE) 50 MCG/ACT nasal spray, USE TWO SPRAY IN EACH NOSTRIL EVERY DAY FOR 10 DAYS, Disp: 16 g, Rfl: 5 .  glimepiride (AMARYL) 1 MG tablet, TAKE ONE TABLET BY MOUTH ONCE DAILY BEFORE BREAKFAST, Disp: 30 tablet, Rfl: 3 .  glucose 4 GM chewable tablet, Chew 1 tablet by mouth as needed for low blood sugar., Disp: , Rfl:  .  insulin NPH-regular Human (NOVOLIN 70/30 RELION) (70-30) 100 UNIT/ML injection, Inject 20 Units into the skin 2 (two) times daily with a meal., Disp: 15 mL, Rfl: 3 .  lisinopril (PRINIVIL,ZESTRIL) 40 MG tablet, TAKE ONE TABLET BY MOUTH ONCE DAILY, Disp: 90 tablet, Rfl: 1 .  metoprolol succinate (TOPROL-XL) 100 MG  24 hr tablet, TAKE 1 TABLET EVERY DAY, Disp: 90 tablet, Rfl: 3 .  NIFEdipine (PROCARDIA-XL) 30 MG (OSM) 24 hr tablet, Take 30 mg by mouth 2 (two) times daily.  , Disp: , Rfl:  .  oxymetazoline (AFRIN) 0.05 % nasal spray, Place 1 spray into both nostrils 2 (two) times daily., Disp: , Rfl:  .  pioglitazone (ACTOS) 30 MG tablet, TAKE ONE TABLET BY MOUTH ONCE DAILY, Disp: 90 tablet, Rfl: 1 .  tiZANidine (ZANAFLEX) 2 MG tablet, Take 1 tablet (2 mg total) by mouth at bedtime., Disp: 20 tablet, Rfl: 0  EXAM:  Filed Vitals:   10/04/14 0945  BP: 138/78  Pulse: 65  Temp: 98.2 F (36.8 C)    Body mass index is 28.28 kg/(m^2).  GENERAL: vitals reviewed and listed above, alert, oriented, appears well hydrated and in no acute distress  HEENT: atraumatic, conjunttiva clear, no obvious abnormalities  on inspection of external nose and ears, normal appearance of ear canals and TMs except for clear effusion, clear nasal congestion, mild post oropharyngeal erythema with PND, no tonsillar edema or exudate, no sinus TTP  NECK: no obvious masses on inspection, no bruit, no meningeal signs, no LAD  LUNGS: clear to auscultation bilaterally, no wheezes, rales or rhonchi, good air movement  CV: HRRR, no peripheral edema  MS: moves all extremities without noticeable abnormality, normal inspection of head, neck and arm, ROM of neck mildly limited in rotation, neg spurling, no bony TTP, TTP in R SCM muscle along entire length, not weakness or loss of sensation to light touch in the bilateral UEs, normal radial pulses  PSYCH: pleasant and cooperative, no obvious depression or anxiety  ASSESSMENT AND PLAN:  Discussed the following assessment and plan:  Neck pain - Plan: tiZANidine (ZANAFLEX) 2 MG tablet  -we discussed possible serious and likely etiologies, workup and treatment, treatment risks and return precautions - suspect muscle spasm of R SCM -after this discussion, [place] opted for HEP, muscle relaxer (risks discussed) and heat, INS for allergic rhinitis -follow up advised in 2-3 weeks -of course, we advised [place]  to return or notify a doctor immediately if symptoms worsen or persist or new concerns arise.  .  -Patient advised to return or notify a doctor immediately if symptoms worsen or persist or new concerns arise.  Patient Instructions  BEFORE YOU LEAVE: -follow up in 2-3 weeks with your doctor -neck spasm exercises -prevnar 13  Heat for 15 minutes twice daily  Muscle relaxer at night for 5-7 days and as needed thereafter  Exercises provided 4 days per week as tolerated  Follow up with your doctor sooner if worsening, new symptoms or concerns     Taesha Goodell R.

## 2014-10-04 NOTE — Progress Notes (Signed)
Pre visit review using our clinic review tool, if applicable. No additional management support is needed unless otherwise documented below in the visit note. 

## 2014-10-04 NOTE — Addendum Note (Signed)
Addended by: Agnes Lawrence on: 10/04/2014 11:34 AM   Modules accepted: Orders

## 2014-10-12 ENCOUNTER — Ambulatory Visit: Payer: Medicare Other | Admitting: Family Medicine

## 2014-10-16 ENCOUNTER — Telehealth: Payer: Self-pay | Admitting: Family Medicine

## 2014-10-16 NOTE — Telephone Encounter (Signed)
Pt has been sch

## 2014-10-16 NOTE — Telephone Encounter (Signed)
Yes ma'm :)

## 2014-10-16 NOTE — Telephone Encounter (Signed)
Pt had an appt with dr hunter on 4/14 but cancelled. Pt would like to see md this week for neck pain. Can I use sda slot?

## 2014-10-17 ENCOUNTER — Ambulatory Visit (INDEPENDENT_AMBULATORY_CARE_PROVIDER_SITE_OTHER): Payer: Medicare Other | Admitting: Family Medicine

## 2014-10-17 ENCOUNTER — Encounter: Payer: Self-pay | Admitting: Family Medicine

## 2014-10-17 ENCOUNTER — Ambulatory Visit (INDEPENDENT_AMBULATORY_CARE_PROVIDER_SITE_OTHER)
Admission: RE | Admit: 2014-10-17 | Discharge: 2014-10-17 | Disposition: A | Discharge status: Home / Self Care | Payer: Medicare Other | Source: Ambulatory Visit | Attending: Family Medicine | Admitting: Family Medicine

## 2014-10-17 VITALS — BP 122/60 | HR 66 | Temp 98.0°F | Wt 211.0 lb

## 2014-10-17 DIAGNOSIS — M5032 Other cervical disc degeneration, mid-cervical region: Secondary | ICD-10-CM | POA: Diagnosis not present

## 2014-10-17 DIAGNOSIS — M542 Cervicalgia: Secondary | ICD-10-CM

## 2014-10-17 DIAGNOSIS — E119 Type 2 diabetes mellitus without complications: Secondary | ICD-10-CM | POA: Diagnosis not present

## 2014-10-17 DIAGNOSIS — M5031 Other cervical disc degeneration,  high cervical region: Secondary | ICD-10-CM | POA: Diagnosis not present

## 2014-10-17 LAB — HEMOGLOBIN A1C: HEMOGLOBIN A1C: 7.8 % — AB (ref 4.6–6.5)

## 2014-10-17 MED ORDER — PREDNISONE 20 MG PO TABS: ORAL_TABLET | ORAL | Status: DC

## 2014-10-17 NOTE — Addendum Note (Signed)
Addended by: Marin Olp on: 10/17/2014 05:32 PM   Modules accepted: Orders

## 2014-10-17 NOTE — Patient Instructions (Addendum)
Get x-ray of neck at [facility]  If a1c less than 8 and cervical arthritis, would use steroid for a week If a1c >8 would use a nonsteroidal antiinflammatory  Get you an appointment first week of may with sports medicine in case not improving for further evaluation which may ultimately include MRI though I am not strongly suspicious we will need this.

## 2014-10-17 NOTE — Progress Notes (Signed)
Garret Reddish, MD Phone: [phone]  Subjective:   Matthew [name] is a 74 y.o. year old very pleasant male patient who presents with the following:  Neck Pain R > 2 weeks -Saw Dr. Maudie Mercury 10/04/14 for a few days of neck pain after he woke up from sleeping in chair most of the night. Thought to be "crick" in neck or spasm of SCM and treated with zanaflex. Today about 2 weeks later pain persists. It is not constant all day long but it is reproduced with head movements. He has severe 10/10 pain with turning neck to the left at all and has moderate pain turning to right. If he keeps head perfectly still, minimal pain. When he lays at night pain can be moderate to severe depending on position.  No pain with just touching skin. zanaflex helps some. Tylenol helps minimally. Avoids Nsaids due to history of CKD Stage II.  ROS-Does have an ear swishing sound with bending head forward but no changes in hearing. No rash. No fever/chills/nausea/vomiting. No paresthesias or arm weakness. No fecal or urinary incontinence.   Past Medical History- Patient Active Problem List   Diagnosis Date Noted  . Peripheral vascular disease-claudicatoin 05/14/2007    Priority: High  . Diabetes mellitus type II, controlled 12/28/2006    Priority: High  .  Coronary artery disease s/p CABG 1997 12/28/2006    Priority: High  . Erectile dysfunction 08/13/2010    Priority: Medium  . 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 Hyperlipidemia 12/28/2006    Priority: Medium  . GOUT 12/28/2006    Priority: Medium  . Essential hypertension 12/28/2006    Priority: Medium  . Pulmonary nodule seen on imaging study 05/17/2012    Priority: Low   Medications- reviewed and updated Current Outpatient Prescriptions  Medication Sig Dispense Refill  . aspirin 81 MG tablet Take 81 mg by mouth daily.      Marland Kitchen atorvastatin (LIPITOR) 40 MG tablet TAKE ONE TABLET BY MOUTH ONCE DAILY 90 tablet 1  .  chlorthalidone (HYGROTON) 25 MG tablet TAKE ONE TABLET BY MOUTH ONCE DAILY 90 tablet 2  . colchicine (COLCRYS) 0.6 MG tablet Take 0.6 mg by mouth daily.    . fexofenadine (ALLEGRA) 180 MG tablet Take 180 mg by mouth daily.    Marland Kitchen glimepiride (AMARYL) 1 MG tablet TAKE ONE TABLET BY MOUTH ONCE DAILY BEFORE BREAKFAST 30 tablet 3  . glucose 4 GM chewable tablet Chew 1 tablet by mouth as needed for low blood sugar.    . insulin NPH-regular Human (NOVOLIN 70/30 RELION) (70-30) 100 UNIT/ML injection Inject 20 Units into the skin 2 (two) times daily with a meal. 15 mL 3  . lisinopril (PRINIVIL,ZESTRIL) 40 MG tablet TAKE ONE TABLET BY MOUTH ONCE DAILY 90 tablet 1  . metoprolol succinate (TOPROL-XL) 100 MG 24 hr tablet TAKE 1 TABLET EVERY DAY 90 tablet 3  . pioglitazone (ACTOS) 30 MG tablet TAKE ONE TABLET BY MOUTH ONCE DAILY 90 tablet 1  . tiZANidine (ZANAFLEX) 2 MG tablet Take 1 tablet (2 mg total) by mouth at bedtime. 20 tablet 0  . fluticasone (FLONASE) 50 MCG/ACT nasal spray USE TWO SPRAY IN EACH NOSTRIL EVERY DAY FOR 10 DAYS (Patient not taking: Reported on 10/17/2014) 16 g 5  . NIFEdipine (PROCARDIA-XL) 30 MG (OSM) 24 hr tablet Take 30 mg by mouth 2 (two) times daily.      Marland Kitchen oxymetazoline (AFRIN) 0.05 % nasal spray Place 1  spray into both nostrils 2 (two) times daily.      Objective: BP 122/60 mmHg  Pulse 66  Temp(Src) 98 F (36.7 C)  Wt 211 lb (95.709 kg) Gen: NAD, resting comfortably Oropharynx normal, bilateral TM normal Neck: no bruits MSK Neck: does not tolerate me turning his head to the left, on right reasonable range of motion. 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 No rash or skin sensitivity to suggest shingles.  Will plan on cervical films today If  a1c less than 8 and cervical arthritis, would use steroid for a week If a1c >8 would use a nonsteroidal antiinflammatory (CKD II risk) Went ahead and attempted to schedule SM appointment first week of May which would give 7-10 days to see if improvement with above therapy.  Ear swishing sound with moving head forward-if above workup was unremarkable would consider ENT eval  Return precautions advised.   Orders Placed This Encounter  Procedures  . DG Cervical Spine Complete    Standing Status: Future     Number of Occurrences:      Standing Expiration Date: 12/17/2015    Order Specific Question:  Reason for Exam (SYMPTOM  OR DIAGNOSIS REQUIRED)    Answer:  neck pain >2 weeks up to 10/10 with moving neck, suspicious cervical arthritis    Order Specific Question:  Preferred imaging location?    Answer:  [facility]  . Hemoglobin A1c  . Ambulatory referral to Sports Medicine    Referral Priority:  Routine    Referral Type:  Consultation    Referred to Provider:  Lyndal Pulley, DO    Number of Visits Requested:  1

## 2014-10-25 ENCOUNTER — Encounter: Payer: Self-pay | Admitting: Family Medicine

## 2014-10-25 ENCOUNTER — Ambulatory Visit (INDEPENDENT_AMBULATORY_CARE_PROVIDER_SITE_OTHER): Payer: Medicare Other | Admitting: Family Medicine

## 2014-10-25 VITALS — BP 120/70 | Temp 97.8°F | Wt 208.0 lb

## 2014-10-25 DIAGNOSIS — M47812 Spondylosis without myelopathy or radiculopathy, cervical region: Secondary | ICD-10-CM | POA: Insufficient documentation

## 2014-10-25 DIAGNOSIS — M542 Cervicalgia: Secondary | ICD-10-CM

## 2014-10-25 DIAGNOSIS — M4692 Unspecified inflammatory spondylopathy, cervical region: Secondary | ICD-10-CM | POA: Diagnosis not present

## 2014-10-25 MED ORDER — CYCLOBENZAPRINE HCL 5 MG PO TABS: 5.0000 mg | ORAL_TABLET | Freq: Two times a day (BID) | ORAL | Status: DC | PRN

## 2014-10-25 NOTE — Assessment & Plan Note (Signed)
Suspect neck pain and spasm in right side of neck musculature is related to cervical arthritis. Fortunately about 50% better without any real intervention as did not take prednisone. Given the "pop" that he felt one day and subsequent improvement, wonder if alignment issue could be helped with osteopathic care so encouraged to keep scheduled SM appointment. Also told him to stop zanaflex and gave short course of flexeril for when particularly bad

## 2014-10-25 NOTE — Progress Notes (Signed)
Garret Reddish, MD Phone: [phone]  Subjective:   Matthew [name] is a 74 y.o. year old very pleasant male patient who presents with the following:  Neck Pain Likely caused by cervical arthritis -concerned about side effects of prednisone so did not take -reviewed x-rays together -50% better. Was bending forward and turned neck one day and heard a click or pop and had improvement -had also used wife's flexeril and stated much better relief than with zanaflex but he is not routinely taking but would like to have on hand -tinnitus right ear persists but improved along with neck pain  ROS- no paresthesias, no arm weakness, no fecal or urinary incontinence, no leg weakness  Past Medical History- Patient Active Problem List   Diagnosis Date Noted  . Peripheral vascular disease-claudicatoin 05/14/2007    Priority: High  . Diabetes mellitus type II, controlled 12/28/2006    Priority: High  .  Coronary artery disease s/p CABG 1997 12/28/2006    Priority: High  . Erectile dysfunction 08/13/2010    Priority: Medium  . 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 Hyperlipidemia 12/28/2006    Priority: Medium  . GOUT 12/28/2006    Priority: Medium  . Essential hypertension 12/28/2006    Priority: Medium  . Pulmonary nodule seen on imaging study 05/17/2012    Priority: Low  . Cervical arthritis 10/25/2014   Medications- reviewed and updated Current Outpatient Prescriptions  Medication Sig Dispense Refill  . aspirin 81 MG tablet Take 81 mg by mouth daily.      Marland Kitchen atorvastatin (LIPITOR) 40 MG tablet TAKE ONE TABLET BY MOUTH ONCE DAILY 90 tablet 1  . chlorthalidone (HYGROTON) 25 MG tablet TAKE ONE TABLET BY MOUTH ONCE DAILY 90 tablet 2  . colchicine (COLCRYS) 0.6 MG tablet Take 0.6 mg by mouth daily.    . fexofenadine (ALLEGRA) 180 MG tablet Take 180 mg by mouth daily.    Marland Kitchen glimepiride (AMARYL) 1 MG tablet TAKE ONE TABLET BY MOUTH ONCE DAILY BEFORE  BREAKFAST 30 tablet 3  . insulin NPH-regular Human (NOVOLIN 70/30 RELION) (70-30) 100 UNIT/ML injection Inject 20 Units into the skin 2 (two) times daily with a meal. 15 mL 3  . lisinopril (PRINIVIL,ZESTRIL) 40 MG tablet TAKE ONE TABLET BY MOUTH ONCE DAILY 90 tablet 1  . metoprolol succinate (TOPROL-XL) 100 MG 24 hr tablet TAKE 1 TABLET EVERY DAY 90 tablet 3  . NIFEdipine (PROCARDIA-XL) 30 MG (OSM) 24 hr tablet Take 30 mg by mouth 2 (two) times daily.      . pioglitazone (ACTOS) 30 MG tablet TAKE ONE TABLET BY MOUTH ONCE DAILY 90 tablet 1  . tiZANidine (ZANAFLEX) 2 MG tablet Take 1 tablet (2 mg total) by mouth at bedtime. 20 tablet 0  . fluticasone (FLONASE) 50 MCG/ACT nasal spray USE TWO SPRAY IN EACH NOSTRIL EVERY DAY FOR 10 DAYS (Patient not taking: Reported on 10/17/2014) 16 g 5  . glucose 4 GM chewable tablet Chew 1 tablet by mouth as needed for low blood sugar.    Marland Kitchen oxymetazoline (AFRIN) 0.05 % nasal spray Place 1 spray into both nostrils 2 (two) times daily.     Objective: BP 120/70 mmHg  Temp(Src) 97.8 F (36.6 C)  Wt 208 lb (94.348 kg) Gen: NAD, resting comfortably CV: RRR no murmurs rubs or gallops Lungs: CTAB no crackles, wheeze, rhonchi  MSK: much improved range of motion of neck, able to move past 45 degrees when turning to right  and left, tighter when turning ot right (last note should have read "does not tolerate me turning his head to the right, on left reasonable range of motion") Neuro: 5/5 muscle strength bilateral upper extremities, normal distal sensation.  Ext: no edema in arms   Assessment/Plan:  Cervical arthritis Suspect neck pain and spasm in right side of neck musculature is related to cervical arthritis. Fortunately about 50% better without any real intervention as did not take prednisone. Given the "pop" that he felt one day and subsequent improvement, wonder if alignment issue could be helped with osteopathic care so encouraged to keep scheduled SM appointment.  Also told him to stop zanaflex and gave short course of flexeril for when particularly bad   4 months. Will need a1c Lab Results  Component Value Date   HGBA1C 7.8* 10/17/2014   Meds ordered this encounter  Medications  . cyclobenzaprine (FLEXERIL) 5 MG tablet    Sig: Take 1 tablet (5 mg total) by mouth 2 (two) times daily as needed for muscle spasms.    Dispense:  30 tablet    Refill:  0

## 2014-10-25 NOTE — Patient Instructions (Addendum)
Glad you are doing better. This does look to be arthritis issue but alignment could be contributing.   Still think you will benefit from sports medicine visit to see if can get you beyond that 50% improvement point  Try flexeril instead of zanaflex  If sound in R ear does not continue to improve, let's consider ENT referral  Let's check in about 4 months

## 2014-10-31 ENCOUNTER — Ambulatory Visit (INDEPENDENT_AMBULATORY_CARE_PROVIDER_SITE_OTHER): Payer: Medicare Other | Admitting: Family Medicine

## 2014-10-31 ENCOUNTER — Encounter: Payer: Self-pay | Admitting: Family Medicine

## 2014-10-31 VITALS — BP 122/62 | HR 74 | Ht 72.0 in | Wt 209.0 lb

## 2014-10-31 DIAGNOSIS — M47812 Spondylosis without myelopathy or radiculopathy, cervical region: Secondary | ICD-10-CM

## 2014-10-31 DIAGNOSIS — M4692 Unspecified inflammatory spondylopathy, cervical region: Secondary | ICD-10-CM

## 2014-10-31 NOTE — Progress Notes (Signed)
Pre visit review using our clinic review tool, if applicable. No additional management support is needed unless otherwise documented below in the visit note. 

## 2014-10-31 NOTE — Assessment & Plan Note (Signed)
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 Patient is overall doing relatively well with no radicular symptoms. Patient is going to do more with kind of a conservative approach. We discussed over-the-counter natural supplementations and potential side effects of these medications. Patient overall does not have a significant past but all history likely this will be more beneficial. Patient given a trial of topical anti-inflammatories and did learn home exercises with athletic trainer today. Patient is going to try to make these different changes and come back and see me again in 3 weeks. Continuing to have difficulty we may need to consider such things as gabapentin and possibly formal physical therapy.

## 2014-10-31 NOTE — Progress Notes (Signed)
[facility] [address] [place], [place] [address] Phone: [phone] Subjective:    I'm seeing this patient by the request  of:  Garret Reddish, MD   CC: neck pain  [id number] Matthew [name] is a 74 y.o. male coming in with complaint of  Neck pain. Patient has had this pain for multiple months if not years. Patient states that this is mostly in the right side. No radiation into the arm or any numbness. Patient states that he has noticed is having more limitation in the range of motion. Patient's primary care provider and x-rays were ordered. These x-rays were reviewed by me and shows moderate osteophytic changes mostly of C5-C7 with significant foraminal narrowing on the right side. Patient states that certain activities can give him more difficulty. Patient has changed some of his activity such as working out secondary to this pain. Patient is also noticing that finding a comfortable position at night can be difficult. Rates the severity of 7 out of 10. Denies any fever, chills, or any abnormal weight loss.  Past Medical History  Diagnosis Date  . DIABETES MELLITUS, TYPE II 12/28/2006  . HYPERLIPIDEMIA 12/28/2006  . GOUT 12/28/2006  . HYPERTENSION 12/28/2006  . CORONARY ARTERY DISEASE 12/28/2006  . CLAUDICATION 05/14/2007  . RENAL FAILURE, CHRONIC 09/15/2008  . LATERAL EPICONDYLITIS, RIGHT 12/11/2009  . GERD (gastroesophageal reflux disease)   . Erosive gastritis   . Duodenal ulcer   . Hiatal hernia   . Hemorrhoids    Past Surgical History  Procedure Laterality Date  . Coronary artery bypass graft    . Colonoscopy    . Knee arthroscopy  2012   History  Substance Use Topics  . Smoking status: Never Smoker   . Smokeless tobacco: Former Systems developer    Types: [place] date: 07/09/1988  . Alcohol Use: 0.6 oz/week    1 Glasses of wine per week   Family History  Problem Relation Age of Onset  . Diabetes Mother   . Hypertension Mother   . Stroke  Father    Allergies  Allergen Reactions  . Penicillins Hives    REACTION: urticaria (hives)  . Tetracycline Hcl     REACTION: urticaria (hives)       Past medical history, social, surgical and family history all reviewed in electronic medical record.   Review of Systems: No headache, visual changes, nausea, vomiting, diarrhea, constipation, dizziness, abdominal pain, skin rash, fevers, chills, night sweats, weight loss, swollen lymph nodes, body aches, joint swelling, muscle aches, chest pain, shortness of breath, mood changes.   Objective Blood pressure 122/62, pulse 74, height 6' (1.829 m), weight 209 lb (94.802 kg), SpO2 98 %.  General: No apparent distress alert and oriented x3 mood and affect normal, dressed appropriately.  HEENT: Pupils equal, extraocular movements intact  Respiratory: Patient's speak in full sentences and does not appear short of breath  Cardiovascular: No lower extremity edema, non tender, no erythema  Skin: Warm dry intact with no signs of infection or rash on extremities or on axial skeleton.  Abdomen: Soft nontender  Neuro: Cranial nerves II through XII are intact, neurovascularly intact in all extremities with 2+ DTRs and 2+ pulses.  Lymph: No lymphadenopathy of posterior or anterior cervical chain or axillae bilaterally.  Gait normal with good balance and coordination.  MSK:  Non tender with full range of motion and good stability and symmetric strength and tone of shoulders, elbows, wrist, hip, knee  and ankles bilaterally.  Neck: Inspection unremarkable. No palpable stepoffs. Negative Spurling's maneuver. Limited range of motion lacking the last 15 of right-sided rotation and right-sided side bending. Lacks last 5 of extension. Grip strength and sensation normal in bilateral hands Strength good C4 to T1 distribution No sensory change to C4 to T1 Negative Hoffman sign bilaterally Reflexes normal  Procedure note 14103; 15 additional minutes spent  for Therapeutic exercises as stated in above notes.  This included exercises focusing on stretching, strengthening, with significant focus on eccentric aspects.  Patient was given flexion-extension exercises as well as side bending exercises. We discussed rotation with muscle energy type techniques as well as strengthening of the upper back and scapular muscles. Proper technique shown and discussed handout in great detail with ATC.  All questions were discussed and answered.     Impression and Recommendations:     This case required medical decision making of moderate complexity.

## 2014-10-31 NOTE — Patient Instructions (Addendum)
Good to see you.  Ice 20 minutes 2 times daily. Usually after activity and before bed. Exercises 3 times a week.  Try pennsaid twice daily Turmeric 500mg  twice daily Vitamin D 2000 IU daily Tylenol 650 mg 3 times daily Fish oil 2 grams daily Glucosamine 1500mg  daily Conitnue to stay active.  Avoid significant overhead activity and keep hands within peripheral vision.  See me again in 3 weeks.

## 2014-11-01 ENCOUNTER — Other Ambulatory Visit: Payer: Self-pay | Admitting: Family Medicine

## 2014-11-02 ENCOUNTER — Telehealth: Payer: Self-pay

## 2014-11-02 MED ORDER — CHLORTHALIDONE 25 MG PO TABS: 25.0000 mg | ORAL_TABLET | Freq: Every day | ORAL | Status: DC

## 2014-11-02 NOTE — Telephone Encounter (Signed)
Medication refilled

## 2014-11-02 NOTE — Telephone Encounter (Signed)
[facility] request for chlorthalidone (HYGROTON) 25 MG tablet

## 2014-11-21 ENCOUNTER — Ambulatory Visit (INDEPENDENT_AMBULATORY_CARE_PROVIDER_SITE_OTHER): Payer: Medicare Other | Admitting: Family Medicine

## 2014-11-21 ENCOUNTER — Encounter: Payer: Self-pay | Admitting: Family Medicine

## 2014-11-21 VITALS — BP 122/64 | HR 76 | Ht 72.0 in | Wt 206.0 lb

## 2014-11-21 DIAGNOSIS — M4692 Unspecified inflammatory spondylopathy, cervical region: Secondary | ICD-10-CM | POA: Diagnosis not present

## 2014-11-21 DIAGNOSIS — M47812 Spondylosis without myelopathy or radiculopathy, cervical region: Secondary | ICD-10-CM

## 2014-11-21 NOTE — Patient Instructions (Signed)
Good to see you Keep truckin along Matthew [name] is yourf friend Muscle relaxor as needed See me when you need me.

## 2014-11-21 NOTE — Progress Notes (Signed)
Pre visit review using our clinic review tool, if applicable. No additional management support is needed unless otherwise documented below in the visit note. 

## 2014-11-21 NOTE — Progress Notes (Signed)
[facility] [address] [place], [place] [address] Phone: [phone] Subjective:      CC: neck pain follow up   [id number] Matthew [name] is a 74 y.o. male coming in with complaint of  Neck pain. Patient has had this pain for multiple months if not years. Patient states that this is mostly in the right side. No radiation into the arm or any numbness. Patient states that he has noticed is having more limitation in the range of motion. Patient's primary care provider and x-rays were ordered. These x-rays were reviewed by me and shows moderate osteophytic changes mostly of C5-C7 with significant foraminal narrowing on the right side.  Patient was given discussion of over-the-counter natural somewhat patient's, topical anti-implant was, home exercises and icing protocol. Patient states he is doing significantly better. Patient describes the pain is significantly less. Patient states that it is a 70% better. Patient is able to do daily activities and is sleeping comfortably. No radicular symptoms. Patient is very happy with the results.  Past Medical History  Diagnosis Date  . DIABETES MELLITUS, TYPE II 12/28/2006  . HYPERLIPIDEMIA 12/28/2006  . GOUT 12/28/2006  . HYPERTENSION 12/28/2006  . CORONARY ARTERY DISEASE 12/28/2006  . CLAUDICATION 05/14/2007  . RENAL FAILURE, CHRONIC 09/15/2008  . LATERAL EPICONDYLITIS, RIGHT 12/11/2009  . GERD (gastroesophageal reflux disease)   . Erosive gastritis   . Duodenal ulcer   . Hiatal hernia   . Hemorrhoids    Past Surgical History  Procedure Laterality Date  . Coronary artery bypass graft    . Colonoscopy    . Knee arthroscopy  2012   History  Substance Use Topics  . Smoking status: Never Smoker   . Smokeless tobacco: Former Systems developer    Types: [place] date: 07/09/1988  . Alcohol Use: 0.6 oz/week    1 Glasses of wine per week   Family History  Problem Relation Age of Onset  . Diabetes Mother   .  Hypertension Mother   . Stroke Father    Allergies  Allergen Reactions  . Penicillins Hives    REACTION: urticaria (hives)  . Tetracycline Hcl     REACTION: urticaria (hives)       Past medical history, social, surgical and family history all reviewed in electronic medical record.   Review of Systems: No headache, visual changes, nausea, vomiting, diarrhea, constipation, dizziness, abdominal pain, skin rash, fevers, chills, night sweats, weight loss, swollen lymph nodes, body aches, joint swelling, muscle aches, chest pain, shortness of breath, mood changes.   Objective Blood pressure 122/64, pulse 76, height 6' (1.829 m), weight 206 lb (93.441 kg).  General: No apparent distress alert and oriented x3 mood and affect normal, dressed appropriately.  HEENT: Pupils equal, extraocular movements intact  Respiratory: Patient's speak in full sentences and does not appear short of breath  Cardiovascular: No lower extremity edema, non tender, no erythema  Skin: Warm dry intact with no signs of infection or rash on extremities or on axial skeleton.  Abdomen: Soft nontender  Neuro: Cranial nerves II through XII are intact, neurovascularly intact in all extremities with 2+ DTRs and 2+ pulses.  Lymph: No lymphadenopathy of posterior or anterior cervical chain or axillae bilaterally.  Gait normal with good balance and coordination.  MSK:  Non tender with full range of motion and good stability and symmetric strength and tone of shoulders, elbows, wrist, hip, knee and ankles bilaterally.  Neck: Inspection unremarkable. No  palpable stepoffs. Negative Spurling's maneuver. Getting increase in range of motion from previous exam still lacking the last 5 of extension. Grip strength and sensation normal in bilateral hands Strength good C4 to T1 distribution No sensory change to C4 to T1 Negative Hoffman sign bilaterally Reflexes normal    Impression and Recommendations:     This case required  medical decision making of moderate complexity.

## 2014-11-21 NOTE — Assessment & Plan Note (Signed)
Significantly better at this time. Encourage patient to continue conservative therapy with home exercises, icing, over-the-counter natural supplementations and as needed muscle relaxers. If patient has any worsening symptoms he will call me.

## 2014-12-07 ENCOUNTER — Other Ambulatory Visit: Payer: Self-pay | Admitting: Family Medicine

## 2014-12-07 MED ORDER — GLIMEPIRIDE 1 MG PO TABS: ORAL_TABLET | ORAL | Status: DC

## 2014-12-07 NOTE — Telephone Encounter (Signed)
Filled for 2 months.  Per OV note on 10/25/14 pt should return in 4 months (Aug 16) with A1C.

## 2014-12-20 DIAGNOSIS — I10 Essential (primary) hypertension: Secondary | ICD-10-CM | POA: Diagnosis not present

## 2014-12-20 DIAGNOSIS — M47812 Spondylosis without myelopathy or radiculopathy, cervical region: Secondary | ICD-10-CM | POA: Diagnosis not present

## 2014-12-20 DIAGNOSIS — N183 Chronic kidney disease, stage 3 (moderate): Secondary | ICD-10-CM | POA: Diagnosis not present

## 2014-12-20 DIAGNOSIS — E119 Type 2 diabetes mellitus without complications: Secondary | ICD-10-CM | POA: Diagnosis not present

## 2015-02-08 ENCOUNTER — Other Ambulatory Visit: Payer: Self-pay | Admitting: Family Medicine

## 2015-02-13 DIAGNOSIS — Z23 Encounter for immunization: Secondary | ICD-10-CM | POA: Diagnosis not present

## 2015-02-27 ENCOUNTER — Other Ambulatory Visit: Payer: Self-pay | Admitting: Family Medicine

## 2015-03-06 ENCOUNTER — Other Ambulatory Visit: Payer: Self-pay | Admitting: Family Medicine

## 2015-03-06 ENCOUNTER — Other Ambulatory Visit: Payer: Self-pay | Admitting: Internal Medicine

## 2015-03-28 ENCOUNTER — Ambulatory Visit (INDEPENDENT_AMBULATORY_CARE_PROVIDER_SITE_OTHER): Payer: Medicare Other | Admitting: Cardiovascular Disease

## 2015-03-28 ENCOUNTER — Encounter: Payer: Self-pay | Admitting: Cardiovascular Disease

## 2015-03-28 VITALS — BP 118/60 | HR 63 | Ht 72.0 in | Wt 207.0 lb

## 2015-03-28 DIAGNOSIS — I2510 Atherosclerotic heart disease of native coronary artery without angina pectoris: Secondary | ICD-10-CM

## 2015-03-28 NOTE — Patient Instructions (Signed)
Medication Instructions:  None  Labwork: None  Testing/Procedures: None  Follow-Up: Your physician wants you to follow-up in: 1 year with Dr. Cooper. You will receive a reminder letter in the mail two months in advance. If you don't receive a letter, please call our office to schedule the follow-up appointment.   Any Other Special Instructions Will Be Listed Below (If Applicable).   

## 2015-03-28 NOTE — Progress Notes (Signed)
Cardiology Office Note Date:  03/28/2015   ID:  Matthew [name], Matthew [name] [birth year], MRN [id number]  PCP:  Garret Reddish, MD  Cardiologist:  Sherren Mocha, MD    Chief Complaint  Patient presents with  . Coronary Artery Disease    History of Present Illness: LYRIC HOAR is a 74 y.o. male who presents for follow-up evaluation. The patient has coronary artery disease status post CABG in 1997. He has peripheral arterial disease with bilateral SFA occlusions. His last nuclear scan was in 2013 demonstrating normal perfusion with a left ventricular ejection fraction of 60%.  The patient is doing well. He remains physically active and has no symptoms with exertion. He specifically denies chest pain or pressure. He denies symptoms of claudication or leg pain. No shortness of breath, orthopnea, or PND. His medications are unchanged.   Past Medical History  Diagnosis Date  . DIABETES MELLITUS, TYPE II 12/28/2006  . HYPERLIPIDEMIA 12/28/2006  . GOUT 12/28/2006  . HYPERTENSION 12/28/2006  . CORONARY ARTERY DISEASE 12/28/2006  . CLAUDICATION 05/14/2007  . RENAL FAILURE, CHRONIC 09/15/2008  . LATERAL EPICONDYLITIS, RIGHT 12/11/2009  . GERD (gastroesophageal reflux disease)   . Erosive gastritis   . Duodenal ulcer   . Hiatal hernia   . Hemorrhoids     Past Surgical History  Procedure Laterality Date  . Coronary artery bypass graft    . Colonoscopy    . Knee arthroscopy  2012    Current Outpatient Prescriptions  Medication Sig Dispense Refill  . aspirin 81 MG tablet Take 81 mg by mouth daily.      Marland Kitchen atorvastatin (LIPITOR) 40 MG tablet TAKE ONE TABLET BY MOUTH ONCE DAILY 90 tablet 3  . chlorthalidone (HYGROTON) 25 MG tablet Take 1 tablet (25 mg total) by mouth daily. 90 tablet 2  . colchicine (COLCRYS) 0.6 MG tablet Take 0.6 mg by mouth daily.    . cyclobenzaprine (FLEXERIL) 5 MG tablet Take 1 tablet (5 mg total) by mouth 2 (two) times daily as needed for muscle spasms. 30 tablet 0  .  fexofenadine (ALLEGRA) 180 MG tablet Take 180 mg by mouth daily.    . fluticasone (FLONASE) 50 MCG/ACT nasal spray Place 2 sprays into both nostrils daily as needed for allergies or rhinitis (nasal congestion).    Marland Kitchen glimepiride (AMARYL) 1 MG tablet TAKE ONE TABLET BY MOUTH ONCE DAILY BEFORE BREAKFAST 30 tablet 5  . glucose 4 GM chewable tablet Chew 1 tablet by mouth as needed for low blood sugar.    . lisinopril (PRINIVIL,ZESTRIL) 40 MG tablet TAKE ONE TABLET BY MOUTH ONCE DAILY 90 tablet 3  . metoprolol succinate (TOPROL-XL) 100 MG 24 hr tablet Take 100 mg by mouth daily. Take with or immediately following a meal.    . NIFEdipine (PROCARDIA-XL) 30 MG (OSM) 24 hr tablet Take 30 mg by mouth 2 (two) times daily.      Marland Kitchen NOVOLIN 70/30 RELION (70-30) 100 UNIT/ML injection INJECT 20 UNITS INTO THE SKIN TWICE DAILY WITH A MEAL. NEEDS OFFICE VISIT. 10 mL 8  . oxymetazoline (AFRIN) 0.05 % nasal spray Place 1 spray into both nostrils 2 (two) times daily.    . pioglitazone (ACTOS) 30 MG tablet TAKE ONE TABLET BY MOUTH ONCE DAILY 90 tablet 3   No current facility-administered medications for this visit.    Allergies:   Penicillins and Tetracycline hcl   Social History:  The patient  reports that he has never smoked. He quit smokeless tobacco use about  26 years ago. His smokeless tobacco use included Chew. He reports that he drinks about 0.6 oz of alcohol per week. He reports that he does not use illicit drugs.   Family History:  The patient's  family history includes Diabetes in his mother; Hypertension in his mother; Stroke in his father.   ROS:  Please see the history of present illness.  Otherwise, review of systems is positive for leg swelling, cough.  All other systems are reviewed and negative.   PHYSICAL EXAM: VS:  BP 118/60 mmHg  Pulse 63  Ht 6' (1.829 m)  Wt 207 lb (93.895 kg)  BMI 28.07 kg/m2 , BMI Body mass index is 28.07 kg/(m^2). GEN: Well nourished, well developed, in no acute  distress HEENT: normal Neck: no JVD, no masses. No carotid bruits Cardiac: RRR without murmur or gallop                Respiratory:  clear to auscultation bilaterally, normal work of breathing GI: soft, nontender, nondistended, + BS MS: no deformity or atrophy Ext: 1+ pretibial edema on the right, trace on the left Skin: warm and dry, no rash Neuro:  Strength and sensation are intact Psych: euthymic mood, full affect  EKG:  EKG is ordered today. The ekg ordered today shows NSR 63 bpm, early repolarization pattern  Recent Labs: 06/05/2014: ALT 17; Hemoglobin 15.1; Platelets 235.0; TSH 1.72 06/12/2014: BUN 32*; Creatinine, Ser 1.6*; Potassium 4.9; Sodium 139   Lipid Panel     Component Value Date/Time   CHOL 164 06/05/2014 0923   TRIG 74.0 06/05/2014 0923   HDL 45.10 06/05/2014 0923   CHOLHDL 4 06/05/2014 0923   VLDL 14.8 06/05/2014 0923   LDLCALC 104* 06/05/2014 0923   LDLDIRECT 150.3 12/16/2010 0832      Wt Readings from Last 3 Encounters:  03/28/15 207 lb (93.895 kg)  11/21/14 206 lb (93.441 kg)  10/31/14 209 lb (94.802 kg)    ASSESSMENT AND PLAN: 1.  CAD, native vessel: the patient is s/p remote CABG. He has no angina. Myoview 2013 was normal. Continue current medical program.  2. PAD with bilateral SFA occlusion: no symptoms of claudication at present. Continue ASA, statin, ACE  3. Essential HTN: BP at goal on current Rx.   4. Hyperlipidemia: on statin Rx. Last LDL above goal. He is due for repeat labs. Discussed lifestyle modification extensively.  Current medicines are reviewed with the patient today.  The patient does not have concerns regarding medicines.  Labs/ tests ordered today include:   Orders Placed This Encounter  Procedures  . EKG 12-Lead    Disposition:   FU one year  Signed, Sherren Mocha, MD  03/28/2015 1:21 PM    [facility] [address], [place], [place]  [address] Phone: [phone]; Fax: [phone]

## 2015-03-29 ENCOUNTER — Other Ambulatory Visit (INDEPENDENT_AMBULATORY_CARE_PROVIDER_SITE_OTHER): Payer: Medicare Other

## 2015-03-29 DIAGNOSIS — E119 Type 2 diabetes mellitus without complications: Secondary | ICD-10-CM | POA: Diagnosis not present

## 2015-03-29 DIAGNOSIS — E785 Hyperlipidemia, unspecified: Secondary | ICD-10-CM | POA: Diagnosis not present

## 2015-03-29 LAB — LDL CHOLESTEROL, DIRECT: Direct LDL: 91 mg/dL

## 2015-03-29 LAB — HEMOGLOBIN A1C: Hgb A1c MFr Bld: 9.2 % — ABNORMAL HIGH (ref 4.6–6.5)

## 2015-04-24 DIAGNOSIS — H16223 Keratoconjunctivitis sicca, not specified as Sjogren's, bilateral: Secondary | ICD-10-CM | POA: Diagnosis not present

## 2015-04-24 DIAGNOSIS — E119 Type 2 diabetes mellitus without complications: Secondary | ICD-10-CM | POA: Diagnosis not present

## 2015-06-19 ENCOUNTER — Ambulatory Visit: Payer: Medicare Other | Admitting: Family Medicine

## 2015-06-29 ENCOUNTER — Other Ambulatory Visit (INDEPENDENT_AMBULATORY_CARE_PROVIDER_SITE_OTHER): Payer: Medicare Other

## 2015-06-29 DIAGNOSIS — E1169 Type 2 diabetes mellitus with other specified complication: Secondary | ICD-10-CM | POA: Diagnosis not present

## 2015-06-29 LAB — HEMOGLOBIN A1C: Hgb A1c MFr Bld: 7.3 % — ABNORMAL HIGH (ref 4.6–6.5)

## 2015-07-19 ENCOUNTER — Encounter: Payer: Self-pay | Admitting: Family Medicine

## 2015-07-19 ENCOUNTER — Ambulatory Visit (INDEPENDENT_AMBULATORY_CARE_PROVIDER_SITE_OTHER): Payer: Medicare Other | Admitting: Family Medicine

## 2015-07-19 VITALS — BP 110/70 | Temp 98.7°F | Wt 200.0 lb

## 2015-07-19 DIAGNOSIS — N183 Chronic kidney disease, stage 3 unspecified: Secondary | ICD-10-CM

## 2015-07-19 DIAGNOSIS — I10 Essential (primary) hypertension: Secondary | ICD-10-CM | POA: Diagnosis not present

## 2015-07-19 DIAGNOSIS — Z794 Long term (current) use of insulin: Secondary | ICD-10-CM | POA: Diagnosis not present

## 2015-07-19 DIAGNOSIS — M546 Pain in thoracic spine: Secondary | ICD-10-CM | POA: Diagnosis not present

## 2015-07-19 DIAGNOSIS — E119 Type 2 diabetes mellitus without complications: Secondary | ICD-10-CM | POA: Diagnosis not present

## 2015-07-19 DIAGNOSIS — E785 Hyperlipidemia, unspecified: Secondary | ICD-10-CM

## 2015-07-19 LAB — COMPREHENSIVE METABOLIC PANEL
ALBUMIN: 4.2 g/dL (ref 3.5–5.2)
ALT: 12 U/L (ref 0–53)
AST: 19 U/L (ref 0–37)
Alkaline Phosphatase: 54 U/L (ref 39–117)
BUN: 30 mg/dL — ABNORMAL HIGH (ref 6–23)
CALCIUM: 9.8 mg/dL (ref 8.4–10.5)
CHLORIDE: 105 meq/L (ref 96–112)
CO2: 31 mEq/L (ref 19–32)
CREATININE: 1.58 mg/dL — AB (ref 0.40–1.50)
GFR: 55.58 mL/min — AB (ref >60.00)
Glucose, Bld: 60 mg/dL — ABNORMAL LOW (ref 70–99)
Potassium: 4.9 mEq/L (ref 3.5–5.1)
Sodium: 143 mEq/L (ref 135–145)
Total Bilirubin: 0.5 mg/dL (ref 0.2–1.2)
Total Protein: 7.7 g/dL (ref 6.0–8.3)

## 2015-07-19 LAB — CBC WITH DIFFERENTIAL/PLATELET
BASOS PCT: 0.4 % (ref 0.0–3.0)
Basophils Absolute: 0 10*3/uL (ref 0.0–0.1)
EOS ABS: 0.3 10*3/uL (ref 0.0–0.7)
EOS PCT: 4.3 % (ref 0.0–5.0)
HEMATOCRIT: 46.1 % (ref 39.0–52.0)
Hemoglobin: 14.9 g/dL (ref 13.0–17.0)
LYMPHS PCT: 19.8 % (ref 12.0–46.0)
Lymphs Abs: 1.2 10*3/uL (ref 0.7–4.0)
MCHC: 32.4 g/dL (ref 30.0–36.0)
MCV: 87.7 fl (ref 78.0–100.0)
Monocytes Absolute: 0.5 10*3/uL (ref 0.1–1.0)
Monocytes Relative: 7.6 % (ref 3.0–12.0)
Neutro Abs: 4 10*3/uL (ref 1.4–7.7)
Neutrophils Relative %: 67.9 % (ref 43.0–77.0)
Platelets: 194 10*3/uL (ref 150.0–400.0)
RBC: 5.26 Mil/uL (ref 4.22–5.81)
RDW: 13.2 % (ref 11.5–15.5)
WBC: 5.9 10*3/uL (ref 4.0–10.5)

## 2015-07-19 LAB — LDL CHOLESTEROL, DIRECT: LDL DIRECT: 79 mg/dL

## 2015-07-19 MED ORDER — FLUTICASONE PROPIONATE 50 MCG/ACT NA SUSP: 2.0000 | Freq: Every day | NASAL | Status: DC | PRN

## 2015-07-19 MED ORDER — CYCLOBENZAPRINE HCL 5 MG PO TABS: 5.0000 mg | ORAL_TABLET | Freq: Two times a day (BID) | ORAL | Status: DC | PRN

## 2015-07-19 NOTE — Progress Notes (Signed)
Garret Reddish, MD  Subjective:  Matthew [name] is a 75 y.o. year old very pleasant male patient who presents for/with See problem oriented charting ROS- No chest pain or shortness of breath. No headache or blurry vision. Denies recent hypoglycemia - with exception of at present confirmed by phone  Past Medical History-  Patient Active Problem List   Diagnosis Date Noted  . Peripheral vascular disease-claudicatoin 05/14/2007    Priority: High  . Diabetes mellitus type II, controlled ([place]) 12/28/2006    Priority: High  .  Coronary artery disease s/p CABG 1997 12/28/2006    Priority: High  . Erectile dysfunction 08/13/2010    Priority: Medium  . CKD (chronic kidney disease), stage III 09/15/2008    Priority: Medium  . Hyperlipidemia 12/28/2006    Priority: Medium  . GOUT 12/28/2006    Priority: Medium  . Essential hypertension 12/28/2006    Priority: Medium  . Pulmonary nodule seen on imaging study 05/17/2012    Priority: Low  . Cervical arthritis ([place]) 10/25/2014    Medications- reviewed and updated Current Outpatient Prescriptions  Medication Sig Dispense Refill  . aspirin 81 MG tablet Take 81 mg by mouth daily.      Marland Kitchen atorvastatin (LIPITOR) 40 MG tablet TAKE ONE TABLET BY MOUTH ONCE DAILY 90 tablet 3  . chlorthalidone (HYGROTON) 25 MG tablet Take 1 tablet (25 mg total) by mouth daily. 90 tablet 2  . colchicine (COLCRYS) 0.6 MG tablet Take 0.6 mg by mouth daily.    . fexofenadine (ALLEGRA) 180 MG tablet Take 180 mg by mouth daily.    . fluticasone (FLONASE) 50 MCG/ACT nasal spray Place 2 sprays into both nostrils daily as needed for allergies or rhinitis (nasal congestion). 16 g 5  . glimepiride (AMARYL) 1 MG tablet TAKE ONE TABLET BY MOUTH ONCE DAILY BEFORE BREAKFAST 30 tablet 5  . glucose 4 GM chewable tablet Chew 1 tablet by mouth as needed for low blood sugar.    . lisinopril (PRINIVIL,ZESTRIL) 40 MG tablet TAKE ONE TABLET BY MOUTH ONCE DAILY 90 tablet 3  . metoprolol  succinate (TOPROL-XL) 100 MG 24 hr tablet Take 100 mg by mouth daily. Take with or immediately following a meal.    . NIFEdipine (PROCARDIA-XL) 30 MG (OSM) 24 hr tablet Take 30 mg by mouth 2 (two) times daily.      Marland Kitchen NOVOLIN 70/30 RELION (70-30) 100 UNIT/ML injection INJECT 20 UNITS INTO THE SKIN TWICE DAILY WITH A MEAL. NEEDS OFFICE VISIT. 10 mL 8  . pioglitazone (ACTOS) 30 MG tablet TAKE ONE TABLET BY MOUTH ONCE DAILY 90 tablet 3  . cyclobenzaprine (FLEXERIL) 5 MG tablet Take 1 tablet (5 mg total) by mouth 2 (two) times daily as needed for muscle spasms. 30 tablet 0   No current facility-administered medications for this visit.    Objective: BP 110/70 mmHg  Temp(Src) 98.7 F (37.1 C)  Wt 200 lb (90.719 kg) Gen: NAD, resting comfortably CV: RRR no murmurs rubs or gallops Lungs: CTAB no crackles, wheeze, rhonchi Abdomen: soft/nontender/nondistended/normal bowel sounds. No rebound or guarding.  Ext: no edema Skin: warm, dry Neuro: grossly normal, moves all extremities  Diabetic Foot Exam - Simple   Simple Foot Form  Diabetic Foot exam was performed with the following findings:  Yes 07/19/2015  8:59 PM  Visual Inspection  No deformities, no ulcerations, no other skin breakdown bilaterally:  Yes  Sensation Testing  Intact to touch and monofilament testing bilaterally:  Yes  Pulse Check  Posterior Tibialis and Dorsalis pulse intact bilaterally:  Yes  Comments     Assessment/Plan:  Diabetes mellitus type II, controlled S: reasonably controlled. On Actos 30mg , insulin 70/30 20 units BID, amaryl 1mg  Denies hypoglycemia yet CBG today on bmet was 60- see phone note Lab Results  Component Value Date   HGBA1C 7.3* 06/29/2015   HGBA1C 9.2* 03/29/2015   HGBA1C 7.8* 10/17/2014   A/P: reasonable control. Continue current medicine. ccounseled by phone about hypoglycemia. Foot exam normal today   CKD (chronic kidney disease), stage III S: GFR stable in 50s last 3 visits including today.  Knows to avoid nsaids. BP controlled A/P: no changes t0day. Already has protection in case proteinuric on lisinopril   Essential hypertension S: controlled on Chlorthalidone 25mg , lisinopril 40, metoprolol 100mg , nifedipine BP Readings from Last 3 Encounters:  07/19/15 110/70  03/28/15 118/60  11/21/14 122/64  A/P:Continue current medications  Left lateral mid back pain S:Left rib pain 3 weeks woke up with it. Gradually worsening. Sometimes can get up to 8-9/10. 5-7/10 most of the time. Worst with twisting. No chest pain, SOB with it. Worse with deep breath, sneezing or coughing. More gas along with this.  O: Very pinpoint pain over left lower ribs A/P: appears to be MSK pain. Tylenol is helping some- continue this and add ice or Heat 3x a day for 20 minutes. We discussed following up within 4 weeks to check in on this area. No fall but consider dedicated rib films. Could also come from thoracic spine so consider dedicated imaging there. Also trial flexeril for muscle spasm  Return in about 4 weeks (around 08/16/2015). Return precautions advised.   Orders Placed This Encounter  Procedures  . CBC with Differential/Platelet  . Comprehensive metabolic panel    [place]  . LDL cholesterol, direct    [place]    Meds ordered this encounter  . fluticasone (FLONASE) 50 MCG/ACT nasal spray    Sig: Place 2 sprays into both nostrils daily as needed for allergies or rhinitis (nasal congestion).    Dispense:  16 g    Refill:  5  . cyclobenzaprine (FLEXERIL) 5 MG tablet    Sig: Take 1 tablet (5 mg total) by mouth 2 (two) times daily as needed for muscle spasms.    Dispense:  30 tablet    Refill:  0

## 2015-07-19 NOTE — Assessment & Plan Note (Signed)
S: controlled on Chlorthalidone 25mg , lisinopril 40, metoprolol 100mg , nifedipine BP Readings from Last 3 Encounters:  07/19/15 110/70  03/28/15 118/60  11/21/14 122/64  A/P:Continue current medications

## 2015-07-19 NOTE — Patient Instructions (Addendum)
A1c looks much better! No changes. Let's check in 4 months to reevaluate diabetes and get another a1c  Restart flonase for runny nose- use through next visit  For left rib pain- get some labs today. Let's follow up within a month or see [place] sooner if worsening. We could get rib as well as abdominal films. It seems muscular as pain is reproduced when I push on it. Can trial flexeril as needed- do not take within at least 8 hours of driving

## 2015-07-19 NOTE — Assessment & Plan Note (Signed)
S: reasonably controlled. On Actos 30mg , insulin 70/30 20 units BID, amaryl 1mg  Denies hypoglycemia yet CBG today on bmet was 60- see phone note Lab Results  Component Value Date   HGBA1C 7.3* 06/29/2015   HGBA1C 9.2* 03/29/2015   HGBA1C 7.8* 10/17/2014   A/P: reasonable control. Continue current medicine. ccounseled by phone about hypoglycemia. Foot exam normal today

## 2015-07-19 NOTE — Assessment & Plan Note (Signed)
S: GFR stable in 50s last 3 visits including today. Knows to avoid nsaids. BP controlled A/P: no changes t0day. Already has protection in case proteinuric on lisinopril

## 2015-08-01 ENCOUNTER — Encounter: Payer: Self-pay | Admitting: Gastroenterology

## 2015-10-09 ENCOUNTER — Encounter: Payer: Self-pay | Admitting: Gastroenterology

## 2015-10-12 ENCOUNTER — Other Ambulatory Visit: Payer: Self-pay | Admitting: Family Medicine

## 2015-10-16 ENCOUNTER — Other Ambulatory Visit: Payer: Self-pay | Admitting: Family Medicine

## 2015-10-16 DIAGNOSIS — M25572 Pain in left ankle and joints of left foot: Secondary | ICD-10-CM | POA: Diagnosis not present

## 2015-10-16 DIAGNOSIS — S99822A Other specified injuries of left foot, initial encounter: Secondary | ICD-10-CM | POA: Diagnosis not present

## 2015-10-16 MED ORDER — GLIMEPIRIDE 1 MG PO TABS: ORAL_TABLET | ORAL | Status: DC

## 2015-10-22 ENCOUNTER — Other Ambulatory Visit: Payer: Self-pay | Admitting: Family Medicine

## 2015-10-23 ENCOUNTER — Encounter: Payer: Self-pay | Admitting: Family Medicine

## 2015-10-24 ENCOUNTER — Other Ambulatory Visit: Payer: Self-pay | Admitting: Family Medicine

## 2015-11-06 DIAGNOSIS — M25572 Pain in left ankle and joints of left foot: Secondary | ICD-10-CM | POA: Diagnosis not present

## 2015-11-06 DIAGNOSIS — S99822D Other specified injuries of left foot, subsequent encounter: Secondary | ICD-10-CM | POA: Diagnosis not present

## 2015-11-28 DIAGNOSIS — M25572 Pain in left ankle and joints of left foot: Secondary | ICD-10-CM | POA: Diagnosis not present

## 2015-11-28 DIAGNOSIS — S99822D Other specified injuries of left foot, subsequent encounter: Secondary | ICD-10-CM | POA: Diagnosis not present

## 2015-12-11 ENCOUNTER — Telehealth: Payer: Self-pay | Admitting: Family Medicine

## 2015-12-11 NOTE — Telephone Encounter (Signed)
error 

## 2015-12-12 ENCOUNTER — Ambulatory Visit (AMBULATORY_SURGERY_CENTER): Payer: Self-pay

## 2015-12-12 VITALS — Ht 72.0 in | Wt 199.2 lb

## 2015-12-12 DIAGNOSIS — Z860100 Personal history of colon polyps, unspecified: Secondary | ICD-10-CM

## 2015-12-12 MED ORDER — SUPREP BOWEL PREP KIT 17.5-3.13-1.6 GM/177ML PO SOLN: 1.0000 | Freq: Once | ORAL | Status: DC

## 2015-12-12 NOTE — Progress Notes (Signed)
No allergies to eggs or soy No past problems with anesthesia No home oxygen No diet meds  Has email and internet; declined emmi 

## 2015-12-25 ENCOUNTER — Encounter: Payer: Self-pay | Admitting: Family Medicine

## 2015-12-25 ENCOUNTER — Ambulatory Visit (INDEPENDENT_AMBULATORY_CARE_PROVIDER_SITE_OTHER): Payer: Medicare Other | Admitting: Family Medicine

## 2015-12-25 VITALS — BP 130/70 | HR 52 | Temp 98.1°F | Ht 72.0 in | Wt 203.0 lb

## 2015-12-25 DIAGNOSIS — N183 Chronic kidney disease, stage 3 unspecified: Secondary | ICD-10-CM

## 2015-12-25 DIAGNOSIS — Z794 Long term (current) use of insulin: Secondary | ICD-10-CM | POA: Diagnosis not present

## 2015-12-25 DIAGNOSIS — E119 Type 2 diabetes mellitus without complications: Secondary | ICD-10-CM | POA: Diagnosis not present

## 2015-12-25 DIAGNOSIS — I10 Essential (primary) hypertension: Secondary | ICD-10-CM

## 2015-12-25 NOTE — Assessment & Plan Note (Signed)
S: GFR stable near 50s on last checks A/P: continue BP control, update BMET today

## 2015-12-25 NOTE — Patient Instructions (Addendum)
Get into eye doctor as soon - fax [place] copy of results- give them my card  Labs before you leave  No changes in medication  If your symptoms do not work themselves out- cough, congestion in the next 2 weeks return to see me

## 2015-12-25 NOTE — Progress Notes (Signed)
Subjective:  Matthew [name] is a 75 y.o. year old very pleasant male patient who presents for/with See problem oriented charting ROS- No chest pain or shortness of breath. No headache or blurry vision.  Does have some cough, congestoin.see any ROS included in HPI as well.   Past Medical History-  Patient Active Problem List   Diagnosis Date Noted  . Peripheral vascular disease-claudicatoin 05/14/2007    Priority: High  . Diabetes mellitus type II, controlled ([place]) 12/28/2006    Priority: High  .  Coronary artery disease s/p CABG 1997 12/28/2006    Priority: High  . Erectile dysfunction 08/13/2010    Priority: Medium  . CKD (chronic kidney disease), stage III 09/15/2008    Priority: Medium  . Hyperlipidemia 12/28/2006    Priority: Medium  . GOUT 12/28/2006    Priority: Medium  . Essential hypertension 12/28/2006    Priority: Medium  . Pulmonary nodule seen on imaging study 05/17/2012    Priority: Low  . Cervical arthritis ([place]) 10/25/2014    Medications- reviewed and updated Current Outpatient Prescriptions  Medication Sig Dispense Refill  . aspirin 81 MG tablet Take 81 mg by mouth daily.      Marland Kitchen atorvastatin (LIPITOR) 40 MG tablet TAKE ONE TABLET BY MOUTH ONCE DAILY 90 tablet 3  . chlorthalidone (HYGROTON) 25 MG tablet TAKE ONE TABLET BY MOUTH ONCE DAILY 90 tablet 2  . colchicine (COLCRYS) 0.6 MG tablet Take 0.6 mg by mouth daily.    . cyclobenzaprine (FLEXERIL) 5 MG tablet Take 1 tablet (5 mg total) by mouth 2 (two) times daily as needed for muscle spasms. 30 tablet 0  . fexofenadine (ALLEGRA) 180 MG tablet Take 180 mg by mouth daily.    . fluticasone (FLONASE) 50 MCG/ACT nasal spray Place 2 sprays into both nostrils daily as needed for allergies or rhinitis (nasal congestion). 16 g 5  . glimepiride (AMARYL) 1 MG tablet TAKE ONE TABLET BY MOUTH ONCE DAILY BEFORE BREAKFAST 90 tablet 1  . glucose 4 GM chewable tablet Chew 1 tablet by mouth as needed for low blood sugar.    .  lisinopril (PRINIVIL,ZESTRIL) 40 MG tablet TAKE ONE TABLET BY MOUTH ONCE DAILY 90 tablet 3  . metoprolol succinate (TOPROL-XL) 100 MG 24 hr tablet Take 100 mg by mouth daily. Take with or immediately following a meal.    . NIFEdipine (PROCARDIA-XL) 30 MG (OSM) 24 hr tablet Take 30 mg by mouth 2 (two) times daily.      Marland Kitchen NOVOLIN 70/30 RELION (70-30) 100 UNIT/ML injection INJECT 20 UNITS INTO THE SKIN TWICE DAILY WITH A MEAL. *NEED TO SCHEDULE OFFICE VISIT* 10 mL 3  . Omega-3 Fatty Acids (FISH OIL PO) Take 1,400 mg by mouth.    . pioglitazone (ACTOS) 30 MG tablet TAKE ONE TABLET BY MOUTH ONCE DAILY 90 tablet 3  . SUPREP BOWEL PREP KIT 17.5-3.13-1.6 GM/180ML SOLN Take 1 kit by mouth once. 354 mL 0   No current facility-administered medications for this visit.    Objective: BP 130/70 mmHg  Pulse 52  Temp(Src) 98.1 F (36.7 C) (Oral)  Ht 6' (1.829 m)  Wt 203 lb (92.08 kg)  BMI 27.53 kg/m2 Gen: NAD, resting comfortably TM normal, clear drainage in nares with some green tinge, oropharynx normal except some drainage in pharynx CV: RRR no murmurs rubs or gallops Lungs: CTAB no crackles, wheeze, rhonchi Abdomen: soft/nontender/nondistended/normal bowel sounds Ext: no edema Skin: warm, dry Neuro: grossly normal, moves all extremities  Assessment/Plan:  Cough, congestion S:Green sputum from nose and through cough, over a week, starting to improve. No sinus pain at this point- had some pressure previously Ros- no fever, shortness of breath, mild right ear pain A/P: suspect URI- could be sinusitis but no significant pain- return precautions given. Add plain mucinex  Diabetes mellitus type II, controlled S: suspect mild poorly controlled. On actos 57m, amaryl 182m insulin 70/30 20 units BID. Metformin causes stomach uspet CBGs- averaging over 3 months around 115 in the morning Lab Results  Component Value Date   HGBA1C 7.3* 06/29/2015   HGBA1C 9.2* 03/29/2015   HGBA1C 7.8* 10/17/2014    A/P: will tolerate a1c <8 given fact that BID insulin does not match  Bodies typical rhythms. Weight up 4 lbs- needs to reverse trend  Essential hypertension S: controlled on Chlorthalidone 2549mlisinopril 40, metoprolol 100m26mifedipine  BP Readings from Last 3 Encounters:  12/25/15 130/70  07/19/15 110/70  03/28/15 118/60  A/P:Continue current meds:  Doing well   CKD (chronic kidney disease), stage III S: GFR stable near 50s on last checks A/P: continue BP control, update BMET today    6 months CPE verbal  Orders Placed This Encounter  Procedures  . CBC    [place]  . Comprehensive metabolic panel    [place]  . Hemoglobin A1c       Return precautions advised.  StepGarret Reddish

## 2015-12-25 NOTE — Progress Notes (Signed)
Pre visit review using our clinic review tool, if applicable. No additional management support is needed unless otherwise documented below in the visit note. 

## 2015-12-25 NOTE — Assessment & Plan Note (Signed)
S: controlled on Chlorthalidone 25mg , lisinopril 40, metoprolol 100mg , nifedipine  BP Readings from Last 3 Encounters:  12/25/15 130/70  07/19/15 110/70  03/28/15 118/60  A/P:Continue current meds:  Doing well

## 2015-12-25 NOTE — Assessment & Plan Note (Signed)
S: suspect mild poorly controlled. On actos 30mg , amaryl 1mg , insulin 70/30 20 units BID. Metformin causes stomach uspet CBGs- averaging over 3 months around 115 in the morning Lab Results  Component Value Date   HGBA1C 7.3* 06/29/2015   HGBA1C 9.2* 03/29/2015   HGBA1C 7.8* 10/17/2014   A/P: will tolerate a1c <8 given fact that BID insulin does not match  Bodies typical rhythms. Weight up 4 lbs- needs to reverse trend

## 2015-12-26 ENCOUNTER — Encounter: Payer: Self-pay | Admitting: Gastroenterology

## 2015-12-26 ENCOUNTER — Ambulatory Visit (AMBULATORY_SURGERY_CENTER): Payer: Medicare Other | Admitting: Gastroenterology

## 2015-12-26 VITALS — BP 142/61 | HR 51 | Temp 97.3°F | Resp 10 | Ht 72.0 in | Wt 199.0 lb

## 2015-12-26 DIAGNOSIS — D123 Benign neoplasm of transverse colon: Secondary | ICD-10-CM

## 2015-12-26 DIAGNOSIS — D120 Benign neoplasm of cecum: Secondary | ICD-10-CM | POA: Diagnosis not present

## 2015-12-26 DIAGNOSIS — I2510 Atherosclerotic heart disease of native coronary artery without angina pectoris: Secondary | ICD-10-CM | POA: Diagnosis not present

## 2015-12-26 DIAGNOSIS — I10 Essential (primary) hypertension: Secondary | ICD-10-CM | POA: Diagnosis not present

## 2015-12-26 DIAGNOSIS — Z860101 Personal history of adenomatous and serrated colon polyps: Secondary | ICD-10-CM

## 2015-12-26 DIAGNOSIS — I739 Peripheral vascular disease, unspecified: Secondary | ICD-10-CM | POA: Diagnosis not present

## 2015-12-26 DIAGNOSIS — Z86010 Personal history of colonic polyps: Secondary | ICD-10-CM

## 2015-12-26 DIAGNOSIS — E669 Obesity, unspecified: Secondary | ICD-10-CM | POA: Diagnosis not present

## 2015-12-26 DIAGNOSIS — E119 Type 2 diabetes mellitus without complications: Secondary | ICD-10-CM | POA: Diagnosis not present

## 2015-12-26 LAB — COMPREHENSIVE METABOLIC PANEL
ALBUMIN: 4.2 g/dL (ref 3.5–5.2)
ALT: 13 U/L (ref 0–53)
AST: 19 U/L (ref 0–37)
Alkaline Phosphatase: 51 U/L (ref 39–117)
BILIRUBIN TOTAL: 0.4 mg/dL (ref 0.2–1.2)
BUN: 24 mg/dL — AB (ref 6–23)
CHLORIDE: 103 meq/L (ref 96–112)
CO2: 31 mEq/L (ref 19–32)
CREATININE: 1.51 mg/dL — AB (ref 0.40–1.50)
Calcium: 9.7 mg/dL (ref 8.4–10.5)
GFR: 58.49 mL/min — ABNORMAL LOW (ref >60.00)
Glucose, Bld: 71 mg/dL (ref 70–99)
Potassium: 4.4 mEq/L (ref 3.5–5.1)
Sodium: 139 mEq/L (ref 135–145)
Total Protein: 8 g/dL (ref 6.0–8.3)

## 2015-12-26 LAB — CBC
HCT: 44.1 % (ref 39.0–52.0)
Hemoglobin: 14.4 g/dL (ref 13.0–17.0)
MCHC: 32.7 g/dL (ref 30.0–36.0)
MCV: 86.6 fl (ref 78.0–100.0)
Platelets: 165 10*3/uL (ref 150.0–400.0)
RBC: 5.09 Mil/uL (ref 4.22–5.81)
RDW: 13.2 % (ref 11.5–15.5)
WBC: 6.9 10*3/uL (ref 4.0–10.5)

## 2015-12-26 LAB — HEMOGLOBIN A1C: HEMOGLOBIN A1C: 7.5 % — AB (ref 4.6–6.5)

## 2015-12-26 MED ORDER — SODIUM CHLORIDE 0.9 % IV SOLN: 500.0000 mL | INTRAVENOUS | Status: DC

## 2015-12-26 NOTE — Patient Instructions (Signed)
Discharge instructions given. Handouts on polyps and diverticulosis. Resume previous medications. YOU HAD AN ENDOSCOPIC PROCEDURE TODAY AT THE [facility]:   Refer to the procedure report that was given to you for any specific questions about what was found during the examination.  If the procedure report does not answer your questions, please call your gastroenterologist to clarify.  If you requested that your care partner not be given the details of your procedure findings, then the procedure report has been included in a sealed envelope for you to review at your convenience later.  YOU SHOULD EXPECT: Some feelings of bloating in the abdomen. Passage of more gas than usual.  Walking can help get rid of the air that was put into your GI tract during the procedure and reduce the bloating. If you had a lower endoscopy (such as a colonoscopy or flexible sigmoidoscopy) you may notice spotting of blood in your stool or on the toilet paper. If you underwent a bowel prep for your procedure, you may not have a normal bowel movement for a few days.  Please Note:  You might notice some irritation and congestion in your nose or some drainage.  This is from the oxygen used during your procedure.  There is no need for concern and it should clear up in a day or so.  SYMPTOMS TO REPORT IMMEDIATELY:   Following lower endoscopy (colonoscopy or flexible sigmoidoscopy):  Excessive amounts of blood in the stool  Significant tenderness or worsening of abdominal pains  Swelling of the abdomen that is new, acute  Fever of 100F or higher   For urgent or emergent issues, a gastroenterologist can be reached at any hour by calling [phone].   DIET: Your first meal following the procedure should be a small meal and then it is ok to progress to your normal diet. Heavy or fried foods are harder to digest and may make you feel nauseous or bloated.  Likewise, meals heavy in dairy and vegetables can  increase bloating.  Drink plenty of fluids but you should avoid alcoholic beverages for 24 hours.  ACTIVITY:  You should plan to take it easy for the rest of today and you should NOT DRIVE or use heavy machinery until tomorrow (because of the sedation medicines used during the test).    FOLLOW UP: Our staff will call the number listed on your records the next business day following your procedure to check on you and address any questions or concerns that you may have regarding the information given to you following your procedure. If we do not reach you, we will leave a message.  However, if you are feeling well and you are not experiencing any problems, there is no need to return our call.  We will assume that you have returned to your regular daily activities without incident.  If any biopsies were taken you will be contacted by phone or by letter within the next 1-3 weeks.  Please call us at [phone] if you have not heard about the biopsies in 3 weeks.    SIGNATURES/CONFIDENTIALITY: You and/or your care partner have signed paperwork which will be entered into your electronic medical record.  These signatures attest to the fact that that the information above on your After Visit Summary has been reviewed and is understood.  Full responsibility of the confidentiality of this discharge information lies with you and/or your care-partner. 

## 2015-12-26 NOTE — Progress Notes (Signed)
Called to room to assist during endoscopic procedure.  Patient ID and intended procedure confirmed with present staff. Received instructions for my participation in the procedure from the performing physician.  

## 2015-12-26 NOTE — Op Note (Signed)
[place] Patient Name: Matthew [name] Procedure Date: 12/26/2015 1:57 PM MRN: [id number] Endoscopist: Ladene Artist , MD Age: 75 Referring MD:  Date of Birth: April 08, 1941 Gender: Male Account #: [id number] Procedure:                Colonoscopy Indications:              High risk colon cancer surveillance: Personal                            history of colonic polyps Medicines:                Monitored Anesthesia Care Procedure:                Pre-Anesthesia Assessment:                           - Prior to the procedure, a History and Physical                            was performed, and patient medications and                            allergies were reviewed. The patient's tolerance of                            previous anesthesia was also reviewed. The risks                            and benefits of the procedure and the sedation                            options and risks were discussed with the patient.                            All questions were answered, and informed consent                            was obtained. Prior Anticoagulants: The patient has                            taken no previous anticoagulant or antiplatelet                            agents. ASA Grade Assessment: II - A patient with                            mild systemic disease. After reviewing the risks                            and benefits, the patient was deemed in                            satisfactory condition to undergo the procedure.  After obtaining informed consent, the colonoscope                            was passed under direct vision. Throughout the                            procedure, the patient's blood pressure, pulse, and                            oxygen saturations were monitored continuously. The                            Model PCF-H190L [id number]) scope was introduced                            through the anus and advanced to the  the cecum,                            identified by appendiceal orifice and ileocecal                            valve. The ileocecal valve, appendiceal orifice,                            and rectum were photographed. The quality of the                            bowel preparation was good. The colonoscopy was                            performed without difficulty. The patient tolerated                            the procedure well. Scope In: 2:04:18 PM Scope Out: 2:19:40 PM Scope Withdrawal Time: 0 hours 13 minutes 36 seconds  Total Procedure Duration: 0 hours 15 minutes 22 seconds  Findings:                 Two sessile polyps were found in the transverse                            colon and cecum. The polyps were 3 to 5 mm in size.                            These polyps were removed with a cold biopsy                            forceps. Resection and retrieval were complete.                           A few small-mouthed diverticula were found in the                            sigmoid colon. There was  no evidence of                            diverticular bleeding.                           The exam was otherwise normal throughout the                            examined colon.                           The retroflexed view of the distal rectum and anal                            verge was normal and showed no anal or rectal                            abnormalities. Complications:            No immediate complications. Estimated blood loss:                            None. Estimated Blood Loss:     Estimated blood loss: none. Impression:               - Two 3 to 5 mm polyps in the transverse colon and                            in the cecum, removed with a cold biopsy forceps.                            Resected and retrieved.                           - Mild diverticulosis in the sigmoid colon. Recommendation:           - Repeat colonoscopy in 5 years for surveillance.                            - Patient has a contact number available for                            emergencies. The signs and symptoms of potential                            delayed complications were discussed with the                            patient. Return to normal activities tomorrow.                            Written discharge instructions were provided to the                            patient.                           -  High fiber diet.                           - Continue present medications.                           - Await pathology results. Ladene Artist, MD 12/26/2015 2:23:05 PM This report has been signed electronically.

## 2015-12-26 NOTE — Progress Notes (Signed)
Report to PACU, RN, vss, BBS= Clear.  

## 2015-12-27 ENCOUNTER — Telehealth: Payer: Self-pay | Admitting: *Deleted

## 2015-12-27 NOTE — Telephone Encounter (Signed)
  Follow up Call-  Call back number 12/26/2015  Post procedure Call Back phone  # [phone]  Permission to leave phone message Yes     Patient questions:  Do you have a fever, pain , or abdominal swelling? No. Pain Score  0 *  Have you tolerated food without any problems? Yes.    Have you been able to return to your normal activities? Yes.    Do you have any questions about your discharge instructions: Diet   No. Medications  No. Follow up visit  No.  Do you have questions or concerns about your Care? No.  Actions: * If pain score is 4 or above: No action needed, pain <4.

## 2015-12-31 DIAGNOSIS — E119 Type 2 diabetes mellitus without complications: Secondary | ICD-10-CM | POA: Diagnosis not present

## 2015-12-31 DIAGNOSIS — H25813 Combined forms of age-related cataract, bilateral: Secondary | ICD-10-CM | POA: Diagnosis not present

## 2015-12-31 LAB — HM DIABETES EYE EXAM

## 2016-01-06 ENCOUNTER — Encounter: Payer: Self-pay | Admitting: Gastroenterology

## 2016-01-07 ENCOUNTER — Encounter: Payer: Self-pay | Admitting: Family Medicine

## 2016-01-08 ENCOUNTER — Encounter: Payer: Self-pay | Admitting: Family Medicine

## 2016-02-22 DIAGNOSIS — Z23 Encounter for immunization: Secondary | ICD-10-CM | POA: Diagnosis not present

## 2016-02-28 ENCOUNTER — Other Ambulatory Visit: Payer: Self-pay | Admitting: Family Medicine

## 2016-03-17 DIAGNOSIS — M47812 Spondylosis without myelopathy or radiculopathy, cervical region: Secondary | ICD-10-CM | POA: Diagnosis not present

## 2016-03-17 DIAGNOSIS — E119 Type 2 diabetes mellitus without complications: Secondary | ICD-10-CM | POA: Diagnosis not present

## 2016-03-17 DIAGNOSIS — I10 Essential (primary) hypertension: Secondary | ICD-10-CM | POA: Diagnosis not present

## 2016-03-17 DIAGNOSIS — N183 Chronic kidney disease, stage 3 (moderate): Secondary | ICD-10-CM | POA: Diagnosis not present

## 2016-03-17 DIAGNOSIS — M109 Gout, unspecified: Secondary | ICD-10-CM | POA: Diagnosis not present

## 2016-03-21 ENCOUNTER — Encounter: Payer: Self-pay | Admitting: Cardiovascular Disease

## 2016-03-24 ENCOUNTER — Encounter: Payer: Self-pay | Admitting: Cardiovascular Disease

## 2016-03-24 ENCOUNTER — Ambulatory Visit (INDEPENDENT_AMBULATORY_CARE_PROVIDER_SITE_OTHER): Payer: Medicare Other | Admitting: Cardiovascular Disease

## 2016-03-24 VITALS — BP 138/58 | HR 66 | Ht 72.0 in | Wt 201.4 lb

## 2016-03-24 DIAGNOSIS — I2510 Atherosclerotic heart disease of native coronary artery without angina pectoris: Secondary | ICD-10-CM | POA: Diagnosis not present

## 2016-03-24 NOTE — Progress Notes (Signed)
Cardiology Office Note Date:  03/24/2016   ID:  Prometheus, Croney 07/02/1940, MRN [id number]  PCP:  Garret Reddish, MD  Cardiologist:  Sherren Mocha, MD    Chief Complaint  Patient presents with  . Coronary Artery Disease     History of Present Illness: Matthew [name] is a 75 y.o. male who presents for follow-up evaluation. The patient has coronary artery disease status post CABG in 1997. He has peripheral arterial disease with bilateral SFA occlusions. His last nuclear scan was in 2013 demonstrating normal perfusion with a left ventricular ejection fraction of 60%.  Patient is doing fine. He denies chest pain or shortness of breath. Some days he feels fatigued and he doesn't do much. Otherwise he remains fairly active and still gets out in his yard to do work. He exercises a few days per week. He denies chest pain, chest pressure, shortness of breath, orthopnea, PND, or heart palpitations. He has mild swelling of the right leg ever since his bypass surgery 20 years ago.  Past Medical History:  Diagnosis Date  . CLAUDICATION 05/14/2007  . CORONARY ARTERY DISEASE 12/28/2006  . Diabetes mellitus type II, controlled ([place]) 12/28/2006   Actos 30mg , insulin 70/30 20 units BID, amaryl 1mg  previously a1c <8. Worsened due to cold over 5 weeks around 05/2014 eating poorly and eating sugary syrups.   Stomach irritation on metformin.  Lab Results  Component Value Date   HGBA1C 8.7* 06/05/2014       . DIABETES MELLITUS, TYPE II 12/28/2006  . Duodenal ulcer   . Erosive gastritis   . GERD (gastroesophageal reflux disease)   . GOUT 12/28/2006  . Hemorrhoids   . Hiatal hernia   . HYPERLIPIDEMIA 12/28/2006  . HYPERTENSION 12/28/2006  . LATERAL EPICONDYLITIS, RIGHT 12/11/2009  . Raynaud's disease   . RENAL FAILURE, CHRONIC 09/15/2008    Past Surgical History:  Procedure Laterality Date  . COLONOSCOPY    . CORONARY ARTERY BYPASS GRAFT    . KNEE ARTHROSCOPY  2012    Current Outpatient  Prescriptions  Medication Sig Dispense Refill  . aspirin 81 MG tablet Take 81 mg by mouth daily.      Marland Kitchen atorvastatin (LIPITOR) 40 MG tablet TAKE ONE TABLET BY MOUTH ONCE DAILY 90 tablet 3  . chlorthalidone (HYGROTON) 25 MG tablet TAKE ONE TABLET BY MOUTH ONCE DAILY 90 tablet 2  . colchicine (COLCRYS) 0.6 MG tablet Take 0.6 mg by mouth daily.    . fexofenadine (ALLEGRA) 180 MG tablet Take 180 mg by mouth daily.    . fluticasone (FLONASE) 50 MCG/ACT nasal spray Place 2 sprays into both nostrils daily as needed for allergies or rhinitis (nasal congestion). 16 g 5  . glimepiride (AMARYL) 1 MG tablet TAKE ONE TABLET BY MOUTH ONCE DAILY BEFORE BREAKFAST 90 tablet 1  . glucose 4 GM chewable tablet Chew 1 tablet by mouth once as needed for low blood sugar. Reported on 12/26/2015    . lisinopril (PRINIVIL,ZESTRIL) 40 MG tablet TAKE ONE TABLET BY MOUTH ONCE DAILY 90 tablet 3  . metoprolol succinate (TOPROL-XL) 100 MG 24 hr tablet Take 100 mg by mouth daily. Take with or immediately following a meal.    . NIFEdipine (PROCARDIA-XL) 30 MG (OSM) 24 hr tablet Take 30 mg by mouth 2 (two) times daily.      Marland Kitchen NOVOLIN 70/30 RELION (70-30) 100 UNIT/ML injection INJECT 20 UNITS INTO THE SKIN TWICE DAILY WITH A MEAL. *NEED TO SCHEDULE OFFICE VISIT* 10 mL  3  . Omega-3 Fatty Acids (FISH OIL PO) Take 1,400 mg by mouth.    . pioglitazone (ACTOS) 30 MG tablet TAKE ONE TABLET BY MOUTH ONCE DAILY 90 tablet 3   No current facility-administered medications for this visit.     Allergies:   Penicillins and Tetracycline hcl   Social History:  The patient  reports that he quit smoking about 47 years ago. His smoking use included Cigarettes. He quit smokeless tobacco use about 27 years ago. His smokeless tobacco use included Chew. He reports that he does not drink alcohol or use drugs.   Family History:  The patient's  family history includes Diabetes in his mother; Hypertension in his mother; Stroke in his father.   ROS:   Please see the history of present illness.  Otherwise, review of systems is positive for leg swelling.  All other systems are reviewed and negative.   PHYSICAL EXAM: VS:  BP (!) 138/58   Pulse 66   Ht 6' (1.829 m)   Wt 201 lb 6.4 oz (91.4 kg)   BMI 27.31 kg/m  , BMI Body mass index is 27.31 kg/m. GEN: Well nourished, well developed, in no acute distress  HEENT: normal  Neck: no JVD, no masses. No carotid bruits Cardiac: RRR without murmur or gallop                Respiratory:  clear to auscultation bilaterally, normal work of breathing GI: soft, nontender, nondistended, + BS MS: no deformity or atrophy  Ext: trace right ankle edema Skin: warm and dry, no rash Neuro:  Strength and sensation are intact Psych: euthymic mood, full affect  EKG:  EKG is ordered today. The ekg ordered today shows NSR, within normal limits  Recent Labs: 12/25/2015: ALT 13; BUN 24; Creatinine, Ser 1.51; Hemoglobin 14.4; Platelets 165.0; Potassium 4.4; Sodium 139   Lipid Panel     Component Value Date/Time   CHOL 164 06/05/2014 0923   TRIG 74.0 06/05/2014 0923   HDL 45.10 06/05/2014 0923   CHOLHDL 4 06/05/2014 0923   VLDL 14.8 06/05/2014 0923   LDLCALC 104 (H) 06/05/2014 0923   LDLDIRECT 79.0 07/19/2015 1107      Wt Readings from Last 3 Encounters:  03/24/16 201 lb 6.4 oz (91.4 kg)  12/26/15 199 lb (90.3 kg)  12/25/15 203 lb (92.1 kg)    ASSESSMENT AND PLAN: 1.  CAD, native vessel, without symptoms of angina: Patient is status post remote CABG. Medications are reviewed and no changes recommended today.  2. Lower extremity peripheral arterial disease with known bilateral SFA occlusion: No symptoms of claudication. Continue conservative therapy with medication/exercise.  3. Essential hypertension: Blood pressure is at goal on current therapy  4. Hyperlipidemia: Most recent lipids reviewed. He continues on a statin drug.  Current medicines are reviewed with the patient today.  The patient  does not have concerns regarding medicines.  Labs/ tests ordered today include:   Orders Placed This Encounter  Procedures  . EKG 12-Lead    Disposition:   FU one year  Signed, Sherren Mocha, MD  03/24/2016 1:29 PM    [facility] [address], [place], [place]  [address] Phone: [phone]; Fax: [phone]

## 2016-03-24 NOTE — Patient Instructions (Signed)

## 2016-03-27 ENCOUNTER — Other Ambulatory Visit: Payer: Self-pay | Admitting: Family Medicine

## 2016-04-07 ENCOUNTER — Other Ambulatory Visit: Payer: Self-pay | Admitting: Family Medicine

## 2016-04-30 ENCOUNTER — Other Ambulatory Visit: Payer: Self-pay | Admitting: Family Medicine

## 2016-06-02 ENCOUNTER — Ambulatory Visit: Payer: Medicare Other | Admitting: Cardiovascular Disease

## 2016-06-19 ENCOUNTER — Other Ambulatory Visit: Payer: Self-pay | Admitting: Family Medicine

## 2016-06-20 ENCOUNTER — Other Ambulatory Visit: Payer: Self-pay | Admitting: Emergency Medicine

## 2016-06-20 NOTE — Telephone Encounter (Signed)
Attempted to call pt received an order request form from [employer] [facility]. Wanted to ask pt if he knew this company and did he need and blood glucose testing supplies.

## 2016-07-02 ENCOUNTER — Ambulatory Visit (INDEPENDENT_AMBULATORY_CARE_PROVIDER_SITE_OTHER): Payer: Medicare Other

## 2016-07-02 VITALS — BP 132/62 | HR 63 | Ht 72.0 in | Wt 200.4 lb

## 2016-07-02 DIAGNOSIS — Z0000 Encounter for general adult medical examination without abnormal findings: Secondary | ICD-10-CM

## 2016-07-02 NOTE — Progress Notes (Signed)
Subjective:   Matthew [name] is a 76 y.o. male who presents for Medicare Annual/Subsequent preventive examination.  The Patient was informed that the wellness visit is to identify future health risk and educate and initiate measures that can reduce risk for increased disease through the lifespan.    NO ROS; Medicare Wellness Visit Is in an energize study at [facility]; has been 8 months now Reviewed his labs per the study  Discussed kidney function and stated he was talking motrin and aleve; stopped this and kidney normalized  81 mg asa   Meds; no issues   Describes health as good, fair or great? good  Preventive Screening -Counseling & Management  Colonoscopy 11/2015   Smoking history/ former smoker; quit 40'  Educated regarding LDCT if applicable with a 30 year hx but neg  Can't confirm 100 cigarette hx as he stated friend's would "give" him cigarettes but never really smoked.   Smokeless tobacco yes; chew quit 1990  Second Hand Smoke status; No Smokers in the home  ETOH  no  RISK FACTORS Regular exercise;  Want to maintain healthy lifestyle Walks 30 to 45 minutes; will stop and rest  Liked to exercise when younger; tennis and basketball and swimming;   Diet Usually eats am bacon, coffee, egg One piece of toast Doesn't eat a lot of bread;  Lunch; loves salads; dressing; fat free  Supper; varies; eat out and in Baked chicken; steak burgers; tuna; salmon grills Vegetables; loves these  Wife tried "hello fresh"   Fall risk no falls Mobility of Functional changes this year? no  Cardiac Risk Factors:  CABG 67; did recently have cv studies  CRF around 60; educated  Advanced aged > 46 in men;  Hyperlipidemia- LDL 79;  Diabetes yes since 2008 (glucose 71; A1c 7.5  Family History (DM; htn; stroke)  Obesity  Eye exam/cmpleted 12/2015   Depression Screen/ PhQ 2: negative  Activities of Daily Living - See functional screen   Cognitive testing; Ad8 score; 0 or  less than 2  MMSE deferred or completed if AD8 + 2 issues  Advanced Directives yes he has completed w Living will    Patient Care Team: Marin Olp, MD as PCP - General (Family Medicine) Dr. Erling Cruz; [facility]; [phone] [facility]  12/31/2015 [phone]  Immunization History  Administered Date(s) Administered  . Influenza Split 03/30/2012  . Influenza Whole 05/03/2009, 03/30/2010, 03/25/2013  . Influenza, High Dose Seasonal PF 02/13/2015  . Influenza,inj,Quad PF,36+ Mos 04/12/2014  . Influenza-Unspecified 02/29/2016  . Pneumococcal Conjugate-13 10/04/2014  . Pneumococcal Polysaccharide-23 03/03/2008  . Td 03/03/2008  . Zoster 08/13/2010   Required Immunizations needed today  Screening test up to date or reviewed for plan of completion Health Maintenance Due  Topic Date Due  . HEMOGLOBIN A1C  06/25/2016    States he has had flu vaccine and documented Will fup with Dr. Yong Channel for A1c;   Cardiac Risk Factors include: advanced age (>15men, >28 women);diabetes mellitus;dyslipidemia;family history of premature cardiovascular disease;hypertension     Objective:    Vitals: BP 132/62   Pulse 63   Ht 6' (1.829 m)   Wt 200 lb 7 oz (90.9 kg)   SpO2 98%   BMI 27.18 kg/m   Body mass index is 27.18 kg/m.  Tobacco History  Smoking Status  . Former Smoker  . Types: Cigarettes  . Quit date: 07/09/1968  Smokeless Tobacco  . Former Systems developer  . Types: Chew  . Quit date:  07/09/1988    Comment: minimal use x 10 years      Counseling given: Yes   Past Medical History:  Diagnosis Date  . CLAUDICATION 05/14/2007  . CORONARY ARTERY DISEASE 12/28/2006  . Diabetes mellitus type II, controlled ([place]) 12/28/2006   Actos 30mg , insulin 70/30 20 units BID, amaryl 1mg  previously a1c <8. Worsened due to cold over 5 weeks around 05/2014 eating poorly and eating sugary syrups.   Stomach irritation on metformin.  Lab Results  Component Value Date   HGBA1C 8.7*  06/05/2014       . DIABETES MELLITUS, TYPE II 12/28/2006  . Duodenal ulcer   . Erosive gastritis   . GERD (gastroesophageal reflux disease)   . GOUT 12/28/2006  . Hemorrhoids   . Hiatal hernia   . HYPERLIPIDEMIA 12/28/2006  . HYPERTENSION 12/28/2006  . LATERAL EPICONDYLITIS, RIGHT 12/11/2009  . Raynaud's disease   . RENAL FAILURE, CHRONIC 09/15/2008   Past Surgical History:  Procedure Laterality Date  . COLONOSCOPY    . CORONARY ARTERY BYPASS GRAFT    . KNEE ARTHROSCOPY  2012   Family History  Problem Relation Age of Onset  . Diabetes Mother   . Hypertension Mother   . Stroke Father   . Colon cancer Neg Hx    History  Sexual Activity  . Sexual activity: Not on file    Reviewed medication per the patient   Activities of Daily Living In your present state of health, do you have any difficulty performing the following activities: 07/02/2016  Hearing? (No Data)  Vision? Y  Difficulty concentrating or making decisions? N  Walking or climbing stairs? N  Dressing or bathing? N  Doing errands, shopping? N  Preparing Food and eating ? N  Using the Toilet? N  In the past six months, have you accidently leaked urine? N  Do you have problems with loss of bowel control? N  Managing your Medications? N  Managing your Finances? N  Housekeeping or managing your Housekeeping? N  Some recent data might be hidden    Patient Care Team: Marin Olp, MD as PCP - General (Family Medicine)   Assessment:   Exercise Activities and Dietary recommendations Current Exercise Habits: Home exercise routine, Type of exercise: walking, Time (Minutes): 30, Frequency (Times/Week): 3, Weekly Exercise (Minutes/Week): 90  Goals    . patient          To maintain health; dietary control  Experiment with swimming or other but add something      Fall Risk Fall Risk  07/02/2016 12/25/2015 10/04/2014  Falls in the past year? No No No   Depression Screen PHQ 2/9 Scores 07/02/2016 12/25/2015 10/04/2014    PHQ - 2 Score 0 0 0    Cognitive Function/ Ad8 score 0  MMSE - Mini Mental State Exam 07/02/2016  Not completed: (No Data)        Immunization History  Administered Date(s) Administered  . Influenza Split 03/30/2012  . Influenza Whole 05/03/2009, 03/30/2010, 03/25/2013  . Influenza, High Dose Seasonal PF 02/13/2015  . Influenza,inj,Quad PF,36+ Mos 04/12/2014  . Influenza-Unspecified 02/29/2016  . Pneumococcal Conjugate-13 10/04/2014  . Pneumococcal Polysaccharide-23 03/03/2008  . Td 03/03/2008  . Zoster 08/13/2010   Screening Tests Health Maintenance  Topic Date Due  . HEMOGLOBIN A1C  06/25/2016  . FOOT EXAM  07/18/2016  . OPHTHALMOLOGY EXAM  12/30/2016  . TETANUS/TDAP  03/03/2018  . COLONOSCOPY  12/25/2020  . INFLUENZA VACCINE  Completed  . ZOSTAVAX  Completed  . PNA vac Low Risk Adult  Completed      Plan:      PCP Notes  Health Maintenance/ had flu vaccine and documented  Abnormal Screens / none   Referrals /none today  Patient concerns; address needs to remain healthy Recommending more exercise  Nurse Concerns; none  Next PCP apt ; to be scheduled to fup Diabetes which is due    During the course of the visit the patient was educated and counseled about the following appropriate screening and preventive services:   Vaccines to include Pneumoccal, Influenza, Hepatitis B, Td, Zostavax, HCV  Electrocardiogram  Cardiovascular Disease  Colorectal cancer screening  Diabetes screening  Prostate Cancer Screening  Glaucoma screening  Nutrition counseling   Smoking cessation counseling  Patient Instructions (the written plan) was given to the patient.    Wynetta Fines, RN  07/04/2016

## 2016-07-02 NOTE — Progress Notes (Signed)
I have reviewed and agree with note, evaluation, plan.   Stephen Hunter, MD  

## 2016-07-02 NOTE — Patient Instructions (Addendum)
Matthew [name] , Thank you for taking time to come for your Medicare Wellness Visit. I appreciate your ongoing commitment to your health goals. Please review the following plan we discussed and let me know if I can assist you in the future.   Good luck!    To schedule apt with Dr. Yong Channel discussed to follow DM    These are the goals we discussed: Goals    . patient          To maintain health; dietary control  Experiment with swimming or other but add something       This is a list of the screening recommended for you and due dates:  Health Maintenance  Topic Date Due  . Flu Shot  01/29/2016  . Hemoglobin A1C  06/25/2016  . Complete foot exam   07/18/2016  . Eye exam for diabetics  12/30/2016  . Tetanus Vaccine  03/03/2018  . Colon Cancer Screening  12/25/2020  . Shingles Vaccine  Completed  . Pneumonia vaccines  Completed      Fall Prevention in the Home Introduction Falls can cause injuries. They can happen to people of all ages. There are many things you can do to make your home safe and to help prevent falls. What can I do on the outside of my home?  Regularly fix the edges of walkways and driveways and fix any cracks.  Remove anything that might make you trip as you walk through a door, such as a raised step or threshold.  Trim any bushes or trees on the path to your home.  Use bright outdoor lighting.  Clear any walking paths of anything that might make someone trip, such as rocks or tools.  Regularly check to see if handrails are loose or broken. Make sure that both sides of any steps have handrails.  Any raised decks and porches should have guardrails on the edges.  Have any leaves, snow, or ice cleared regularly.  Use sand or salt on walking paths during winter.  Clean up any spills in your garage right away. This includes oil or grease spills. What can I do in the bathroom?  Use night lights.  Install grab bars by the toilet and in the tub and  shower. Do not use towel bars as grab bars.  Use non-skid mats or decals in the tub or shower.  If you need to sit down in the shower, use a plastic, non-slip stool.  Keep the floor dry. Clean up any water that spills on the floor as soon as it happens.  Remove soap buildup in the tub or shower regularly.  Attach bath mats securely with double-sided non-slip rug tape.  Do not have throw rugs and other things on the floor that can make you trip. What can I do in the bedroom?  Use night lights.  Make sure that you have a light by your bed that is easy to reach.  Do not use any sheets or blankets that are too big for your bed. They should not hang down onto the floor.  Have a firm chair that has side arms. You can use this for support while you get dressed.  Do not have throw rugs and other things on the floor that can make you trip. What can I do in the kitchen?  Clean up any spills right away.  Avoid walking on wet floors.  Keep items that you use a lot in easy-to-reach places.  If you need to  reach something above you, use a strong step stool that has a grab bar.  Keep electrical cords out of the way.  Do not use floor polish or wax that makes floors slippery. If you must use wax, use non-skid floor wax.  Do not have throw rugs and other things on the floor that can make you trip. What can I do with my stairs?  Do not leave any items on the stairs.  Make sure that there are handrails on both sides of the stairs and use them. Fix handrails that are broken or loose. Make sure that handrails are as long as the stairways.  Check any carpeting to make sure that it is firmly attached to the stairs. Fix any carpet that is loose or worn.  Avoid having throw rugs at the top or bottom of the stairs. If you do have throw rugs, attach them to the floor with carpet tape.  Make sure that you have a light switch at the top of the stairs and the bottom of the stairs. If you do not  have them, ask someone to add them for you. What else can I do to help prevent falls?  Wear shoes that:  Do not have high heels.  Have rubber bottoms.  Are comfortable and fit you well.  Are closed at the toe. Do not wear sandals.  If you use a stepladder:  Make sure that it is fully opened. Do not climb a closed stepladder.  Make sure that both sides of the stepladder are locked into place.  Ask someone to hold it for you, if possible.  Clearly mark and make sure that you can see:  Any grab bars or handrails.  First and last steps.  Where the edge of each step is.  Use tools that help you move around (mobility aids) if they are needed. These include:  Canes.  Walkers.  Scooters.  Crutches.  Turn on the lights when you go into a dark area. Replace any light bulbs as soon as they burn out.  Set up your furniture so you have a clear path. Avoid moving your furniture around.  If any of your floors are uneven, fix them.  If there are any pets around you, be aware of where they are.  Review your medicines with your doctor. Some medicines can make you feel dizzy. This can increase your chance of falling. Ask your doctor what other things that you can do to help prevent falls. This information is not intended to replace advice given to you by your health care provider. Make sure you discuss any questions you have with your health care provider. Document Released: 04/12/2009 Document Revised: 11/22/2015 Document Reviewed: 07/21/2014  2017 Elsevier  Health Maintenance, Male A healthy lifestyle and preventative care can promote health and wellness.  Maintain regular health, dental, and eye exams.  Eat a healthy diet. Foods like vegetables, fruits, whole grains, low-fat dairy products, and lean protein foods contain the nutrients you need and are low in calories. Decrease your intake of foods high in solid fats, added sugars, and salt. Get information about a proper  diet from your health care provider, if necessary.  Regular physical exercise is one of the most important things you can do for your health. Most adults should get at least 150 minutes of moderate-intensity exercise (any activity that increases your heart rate and causes you to sweat) each week. In addition, most adults need muscle-strengthening exercises on 2 or more days  a week.   Maintain a healthy weight. The body mass index (BMI) is a screening tool to identify possible weight problems. It provides an estimate of body fat based on height and weight. Your health care provider can find your BMI and can help you achieve or maintain a healthy weight. For males 20 years and older:  A BMI below 18.5 is considered underweight.  A BMI of 18.5 to 24.9 is normal.  A BMI of 25 to 29.9 is considered overweight.  A BMI of 30 and above is considered obese.  Maintain normal blood lipids and cholesterol by exercising and minimizing your intake of saturated fat. Eat a balanced diet with plenty of fruits and vegetables. Blood tests for lipids and cholesterol should begin at age 62 and be repeated every 5 years. If your lipid or cholesterol levels are high, you are over age 43, or you are at high risk for heart disease, you may need your cholesterol levels checked more frequently.Ongoing high lipid and cholesterol levels should be treated with medicines if diet and exercise are not working.  If you smoke, find out from your health care provider how to quit. If you do not use tobacco, do not start.  Lung cancer screening is recommended for adults aged 33-80 years who are at high risk for developing lung cancer because of a history of smoking. A yearly low-dose CT scan of the lungs is recommended for people who have at least a 30-pack-year history of smoking and are current smokers or have quit within the past 15 years. A pack year of smoking is smoking an average of 1 pack of cigarettes a day for 1 year (for  example, a 30-pack-year history of smoking could mean smoking 1 pack a day for 30 years or 2 packs a day for 15 years). Yearly screening should continue until the smoker has stopped smoking for at least 15 years. Yearly screening should be stopped for people who develop a health problem that would prevent them from having lung cancer treatment.  If you choose to drink alcohol, do not have more than 2 drinks per day. One drink is considered to be 12 oz (360 mL) of beer, 5 oz (150 mL) of wine, or 1.5 oz (45 mL) of liquor.  Avoid the use of street drugs. Do not share needles with anyone. Ask for help if you need support or instructions about stopping the use of drugs.  High blood pressure causes heart disease and increases the risk of stroke. High blood pressure is more likely to develop in:  People who have blood pressure in the end of the normal range (100-139/85-89 mm Hg).  People who are overweight or obese.  People who are African American.  If you are 49-40 years of age, have your blood pressure checked every 3-5 years. If you are 43 years of age or older, have your blood pressure checked every year. You should have your blood pressure measured twice-once when you are at a hospital or clinic, and once when you are not at a hospital or clinic. Record the average of the two measurements. To check your blood pressure when you are not at a hospital or clinic, you can use:  An automated blood pressure machine at a pharmacy.  A home blood pressure monitor.  If you are 78-37 years old, ask your health care provider if you should take aspirin to prevent heart disease.  Diabetes screening involves taking a blood sample to check your fasting  blood sugar level. This should be done once every 3 years after age 20 if you are at a normal weight and without risk factors for diabetes. Testing should be considered at a younger age or be carried out more frequently if you are overweight and have at least 1  risk factor for diabetes.  Colorectal cancer can be detected and often prevented. Most routine colorectal cancer screening begins at the age of 30 and continues through age 37. However, your health care provider may recommend screening at an earlier age if you have risk factors for colon cancer. On a yearly basis, your health care provider may provide home test kits to check for hidden blood in the stool. A small camera at the end of a tube may be used to directly examine the colon (sigmoidoscopy or colonoscopy) to detect the earliest forms of colorectal cancer. Talk to your health care provider about this at age 16 when routine screening begins. A direct exam of the colon should be repeated every 5-10 years through age 3, unless early forms of precancerous polyps or small growths are found.  People who are at an increased risk for hepatitis B should be screened for this virus. You are considered at high risk for hepatitis B if:  You were born in a country where hepatitis B occurs often. Talk with your health care provider about which countries are considered high risk.  Your parents were born in a high-risk country and you have not received a shot to protect against hepatitis B (hepatitis B vaccine).  You have HIV or AIDS.  You use needles to inject street drugs.  You live with, or have sex with, someone who has hepatitis B.  You are a man who has sex with other men (MSM).  You get hemodialysis treatment.  You take certain medicines for conditions like cancer, organ transplantation, and autoimmune conditions.  Hepatitis C blood testing is recommended for all people born from [birth year] through 1963 and any individual with known risk factors for hepatitis C.  Healthy men should no longer receive prostate-specific antigen (PSA) blood tests as part of routine cancer screening. Talk to your health care provider about prostate cancer screening.  Testicular cancer screening is not recommended for  adolescents or adult males who have no symptoms. Screening includes self-exam, a health care provider exam, and other screening tests. Consult with your health care provider about any symptoms you have or any concerns you have about testicular cancer.  Practice safe sex. Use condoms and avoid high-risk sexual practices to reduce the spread of sexually transmitted infections (STIs).  You should be screened for STIs, including gonorrhea and chlamydia if:  You are sexually active and are younger than 24 years.  You are older than 24 years, and your health care provider tells you that you are at risk for this type of infection.  Your sexual activity has changed since you were last screened, and you are at an increased risk for chlamydia or gonorrhea. Ask your health care provider if you are at risk.  If you are at risk of being infected with HIV, it is recommended that you take a prescription medicine daily to prevent HIV infection. This is called pre-exposure prophylaxis (PrEP). You are considered at risk if:  You are a man who has sex with other men (MSM).  You are a heterosexual man who is sexually active with multiple partners.  You take drugs by injection.  You are sexually active with a partner  who has HIV.  Talk with your health care provider about whether you are at high risk of being infected with HIV. If you choose to begin PrEP, you should first be tested for HIV. You should then be tested every 3 months for as long as you are taking PrEP.  Use sunscreen. Apply sunscreen liberally and repeatedly throughout the day. You should seek shade when your shadow is shorter than you. Protect yourself by wearing long sleeves, pants, a wide-brimmed hat, and sunglasses year round whenever you are outdoors.  Tell your health care provider of new moles or changes in moles, especially if there is a change in shape or color. Also, tell your health care provider if a mole is larger than the size of a  pencil eraser.  A one-time screening for abdominal aortic aneurysm (AAA) and surgical repair of large AAAs by ultrasound is recommended for men aged 35-75 years who are current or former smokers.  Stay current with your vaccines (immunizations). This information is not intended to replace advice given to you by your health care provider. Make sure you discuss any questions you have with your health care provider. Document Released: 12/13/2007 Document Revised: 07/07/2014 Document Reviewed: 03/20/2015 Elsevier Interactive Patient Education  2017 [employer].

## 2016-07-29 ENCOUNTER — Telehealth: Payer: Self-pay | Admitting: Family Medicine

## 2016-07-29 NOTE — Telephone Encounter (Signed)
Pt states he got a notice on mychart that he needed an  A1C.  I do not have an order, ok to schedule? Does pt need appt with Dr Yong Channel after that?

## 2016-08-08 NOTE — Telephone Encounter (Signed)
Patient is scheduled for 08/11/16

## 2016-08-12 ENCOUNTER — Other Ambulatory Visit (INDEPENDENT_AMBULATORY_CARE_PROVIDER_SITE_OTHER): Payer: Medicare Other

## 2016-08-12 ENCOUNTER — Ambulatory Visit: Payer: Medicare Other | Admitting: Family Medicine

## 2016-08-12 DIAGNOSIS — E118 Type 2 diabetes mellitus with unspecified complications: Secondary | ICD-10-CM | POA: Diagnosis not present

## 2016-08-12 LAB — HEMOGLOBIN A1C: Hgb A1c MFr Bld: 8 % — ABNORMAL HIGH (ref 4.6–6.5)

## 2016-08-20 ENCOUNTER — Encounter: Payer: Self-pay | Admitting: Family Medicine

## 2016-08-20 ENCOUNTER — Ambulatory Visit (INDEPENDENT_AMBULATORY_CARE_PROVIDER_SITE_OTHER): Payer: Medicare Other | Admitting: Family Medicine

## 2016-08-20 ENCOUNTER — Other Ambulatory Visit: Payer: Self-pay

## 2016-08-20 DIAGNOSIS — I10 Essential (primary) hypertension: Secondary | ICD-10-CM

## 2016-08-20 DIAGNOSIS — N183 Chronic kidney disease, stage 3 unspecified: Secondary | ICD-10-CM

## 2016-08-20 DIAGNOSIS — E785 Hyperlipidemia, unspecified: Secondary | ICD-10-CM

## 2016-08-20 DIAGNOSIS — Z794 Long term (current) use of insulin: Secondary | ICD-10-CM | POA: Diagnosis not present

## 2016-08-20 DIAGNOSIS — E119 Type 2 diabetes mellitus without complications: Secondary | ICD-10-CM

## 2016-08-20 MED ORDER — INSULIN NPH ISOPHANE & REGULAR (70-30) 100 UNIT/ML ~~LOC~~ SUSP: SUBCUTANEOUS | 3 refills | Status: DC

## 2016-08-20 NOTE — Assessment & Plan Note (Signed)
S: controlled on chlorthalidone 25mg , lisinopril 40mg , metoprolol 100mg , nifedipine 30mg  XL.  BP Readings from Last 3 Encounters:  08/20/16 126/62  07/02/16 132/62  03/24/16 (!) 138/58  A/P:Continue current meds:  Doing well

## 2016-08-20 NOTE — Assessment & Plan Note (Signed)
S: reasonably well controlled on atorvastatin 40mg  with last LDL 79 but over a year ago. No myalgias.   A/P: need to update LDL with labs- planned in 3 months

## 2016-08-20 NOTE — Progress Notes (Signed)
Subjective:  Matthew [name] is a 76 y.o. year old very pleasant male patient who presents for/with See problem oriented charting ROS- No chest pain or shortness of breath. No headache or blurry vision.  Very rare lows and CBGs still high 60s usually if missed meal - discouraged this   Past Medical History-  Patient Active Problem List   Diagnosis Date Noted  . Peripheral vascular disease-claudicatoin 05/14/2007    Priority: High  . Diabetes mellitus type II, controlled ([place]) 12/28/2006    Priority: High  .  Coronary artery disease s/p CABG 1997 12/28/2006    Priority: High  . Erectile dysfunction 08/13/2010    Priority: Medium  . CKD (chronic kidney disease), stage III 09/15/2008    Priority: Medium  . Hyperlipidemia 12/28/2006    Priority: Medium  . GOUT 12/28/2006    Priority: Medium  . Essential hypertension 12/28/2006    Priority: Medium  . Pulmonary nodule seen on imaging study 05/17/2012    Priority: Low  . Cervical arthritis ([place]) 10/25/2014    Medications- reviewed and updated Current Outpatient Prescriptions  Medication Sig Dispense Refill  . aspirin 81 MG tablet Take 81 mg by mouth daily.      Marland Kitchen atorvastatin (LIPITOR) 40 MG tablet TAKE ONE TABLET BY MOUTH ONCE DAILY 90 tablet 3  . chlorthalidone (HYGROTON) 25 MG tablet TAKE ONE TABLET BY MOUTH ONCE DAILY 90 tablet 2  . colchicine (COLCRYS) 0.6 MG tablet Take 0.6 mg by mouth daily.    . fexofenadine (ALLEGRA) 180 MG tablet Take 180 mg by mouth daily.    . fluticasone (FLONASE) 50 MCG/ACT nasal spray Place 2 sprays into both nostrils daily as needed for allergies or rhinitis (nasal congestion). 16 g 5  . glimepiride (AMARYL) 1 MG tablet TAKE ONE TABLET BY MOUTH ONCE DAILY BEFORE BREAKFAST 90 tablet 1  . glucose 4 GM chewable tablet Chew 1 tablet by mouth once as needed for low blood sugar. Reported on 12/26/2015    . lisinopril (PRINIVIL,ZESTRIL) 40 MG tablet TAKE ONE TABLET BY MOUTH ONCE DAILY 90 tablet 3  .  metoprolol succinate (TOPROL-XL) 100 MG 24 hr tablet TAKE 1 TABLET EVERY DAY 90 tablet 3  . NIFEdipine (PROCARDIA-XL) 30 MG (OSM) 24 hr tablet Take 30 mg by mouth 2 (two) times daily.      Marland Kitchen NOVOLIN 70/30 RELION (70-30) 100 UNIT/ML injection INJECT 20 UNITS SUBCUTANEOUSLY TWICE DAILY WITH MEALS NEED TO SCHEDULE OFFICE VISIT (Patient taking differently: INJECT 20 UNITS SUBCUTANEOUSLY TWICE DAILY WITH MEALS NEED TO SCHEDULE OFFICE VISIT) 10 mL 3  . Omega-3 Fatty Acids (FISH OIL PO) Take 1,400 mg by mouth.    . pioglitazone (ACTOS) 30 MG tablet TAKE ONE TABLET BY MOUTH ONCE DAILY 90 tablet 3   No current facility-administered medications for this visit.     Objective: BP 126/62 (BP Location: Left Arm, Patient Position: Sitting, Cuff Size: Large)   Pulse 72   Temp 98 F (36.7 C) (Oral)   Ht 6' (1.829 m)   Wt 198 lb 12.8 oz (90.2 kg)   BMI 26.96 kg/m  Gen: NAD, resting comfortably CV: RRR no murmurs rubs or gallops Lungs: CTAB no crackles, wheeze, rhonchi Ext: no edema Skin: warm, dry, no rash, feet lubricated  Diabetic Foot Exam - Simple   Simple Foot Form Diabetic Foot exam was performed with the following findings:  Yes 08/20/2016 10:33 AM  Visual Inspection No deformities, no ulcerations, no other skin breakdown bilaterally:  Yes Sensation  Testing Intact to touch and monofilament testing bilaterally:  Yes Pulse Check Posterior Tibialis and Dorsalis pulse intact bilaterally:  Yes Comments 1+ pulses only bilaterally DP and PT      Assessment/Plan:  Diabetes mellitus type II, controlled ([place]) S: poorly controlled. On actos 30mg , amaryl 1mg , insulin 70/30 22 units, 20. Does not tolerate metformin due to GI upset.  CBGs- has had 2 #s in high 60s only in last 6 months- quickly acted on this. Not at nightime. Forgot to eat- knows needs to eat regularly Exercise and diet- exercise has been minimal, admits he has been eating very poorly with sweets and carbs Lab Results  Component  Value Date   HGBA1C 8.0 (H) 08/12/2016   HGBA1C 7.5 (H) 12/25/2015   HGBA1C 7.3 (H) 06/29/2015   A/P: wants to focus on changing diet before changing meds which is reasonable. Advised much closer follow up with 3 month labs then see me a few days later  Essential hypertension S: controlled on chlorthalidone 25mg , lisinopril 40mg , metoprolol 100mg , nifedipine 30mg  XL.  BP Readings from Last 3 Encounters:  08/20/16 126/62  07/02/16 132/62  03/24/16 (!) 138/58  A/P:Continue current meds:  Doing well  Hyperlipidemia S: reasonably well controlled on atorvastatin 40mg  with last LDL 79 but over a year ago. No myalgias.   A/P: need to update LDL with labs- planned in 3 months  CKD (chronic kidney disease), stage III S: GFR was near 50 previously- last checked over 6 months ago A/P: discussed repeating- prefers to wait until next a1c which I would do in 3 months.   Very mild cramping in calves if walks longer distances- follows with cardiology for PAD and knows to follow up if worsens  3 months for labs then see me a few days later  Orders Placed This Encounter  Procedures  . Hemoglobin A1c    [place]    Standing Status:   Future    Standing Expiration Date:   08/20/2017  . CBC    Standing Status:   Future    Standing Expiration Date:   08/20/2017  . CBC with Differential/Platelet    Standing Status:   Future    Standing Expiration Date:   08/20/2017  . Lipid panel    Standing Status:   Future    Standing Expiration Date:   08/20/2017   Return precautions advised.  Garret Reddish, MD

## 2016-08-20 NOTE — Assessment & Plan Note (Signed)
S: GFR was near 50 previously- last checked over 6 months ago A/P: discussed repeating- prefers to wait until next a1c which I would do in 3 months.

## 2016-08-20 NOTE — Patient Instructions (Signed)
Schedule a lab visit at the check out desk 3 months away. Return for future fasting labs meaning nothing but water after midnight please at that time. Ok to take your medications with water (would hold off on amaryl though as well as actos until after eating).   Then see me a few days later  No changes today other than tightening up that diet which you already know how to do you just have to execute

## 2016-08-20 NOTE — Progress Notes (Signed)
Pre visit review using our clinic review tool, if applicable. No additional management support is needed unless otherwise documented below in the visit note. 

## 2016-08-20 NOTE — Assessment & Plan Note (Signed)
S: poorly controlled. On actos 30mg , amaryl 1mg , insulin 70/30 22 units, 20. Does not tolerate metformin due to GI upset.  CBGs- has had 2 #s in high 60s only in last 6 months- quickly acted on this. Not at nightime. Forgot to eat- knows needs to eat regularly Exercise and diet- exercise has been minimal, admits he has been eating very poorly with sweets and carbs Lab Results  Component Value Date   HGBA1C 8.0 (H) 08/12/2016   HGBA1C 7.5 (H) 12/25/2015   HGBA1C 7.3 (H) 06/29/2015   A/P: wants to focus on changing diet before changing meds which is reasonable. Advised much closer follow up with 3 month labs then see me a few days later

## 2016-09-02 ENCOUNTER — Other Ambulatory Visit: Payer: Self-pay | Admitting: Family Medicine

## 2016-09-09 ENCOUNTER — Other Ambulatory Visit: Payer: Self-pay | Admitting: Family Medicine

## 2016-10-05 ENCOUNTER — Other Ambulatory Visit: Payer: Self-pay | Admitting: Family Medicine

## 2016-10-30 IMAGING — MR MR ELBOW*L* W/O CM
4 series · 20 of 40 positions shown · non-contrast
Comparison: None.

CLINICAL DATA: Chronic left, posterior elbow pain on and off for
months to years. Within the last six months the sensitivity and pain
has increased. Patient states that he is a vigorous showerer and
several times over the years he has hit his left elbow on a
porcelain soap dish in his shower. That is the only injury he feels
he's sustained. Steroid injection was no help. Pain to touch the
posterior elbow and painful to extend his elbow after rest.

EXAM:
MRI OF THE LEFT ELBOW WITHOUT CONTRAST
TECHNIQUE: Multiplanar, multisequence MR imaging of the elbow was performed. No
intravenous contrast was administered.

[Series 5: T1 · axial · 3.0mm · 0.27mm/px · z∈[-25,+32]mm · 3 of 21 slices shown]
[im 3/21]
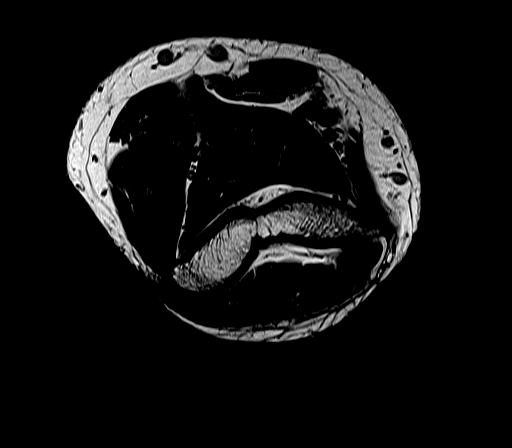
[im 12/21]
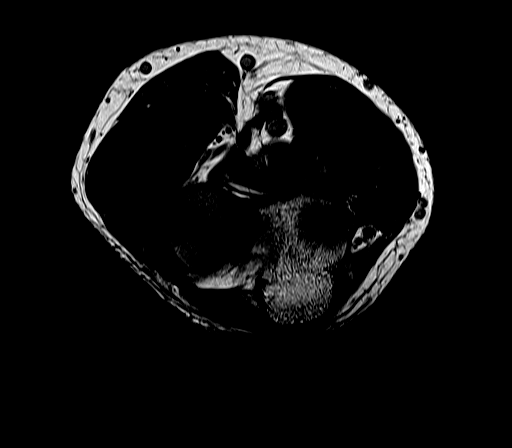
[im 18/21]
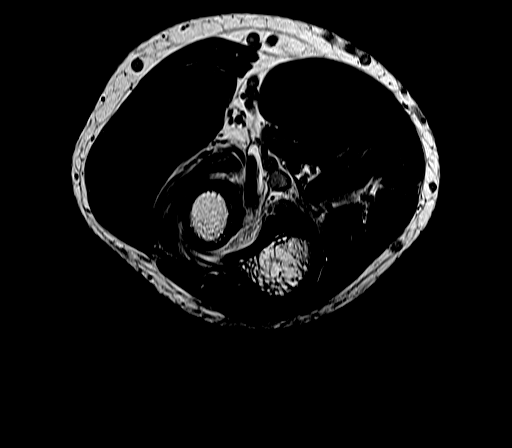

[Series 6: T2 fat-sat · axial · 3.0mm · 0.33mm/px · z∈[-29,+40]mm · 6 of 21 slices shown (1 of 2)]
[im 1/21]
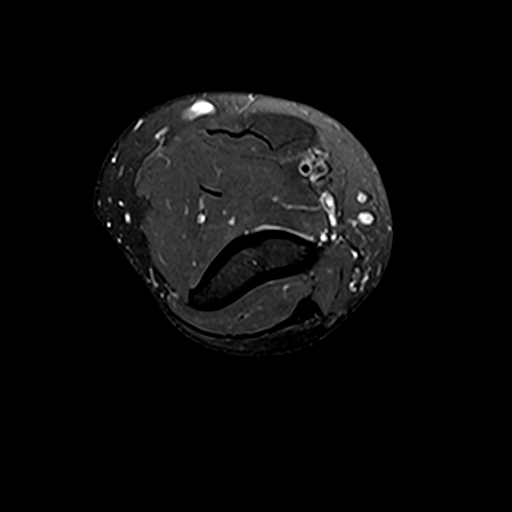
[im 3/21]
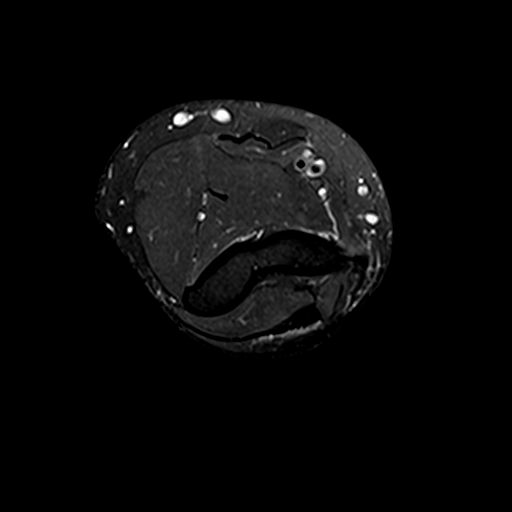
[im 5/21]
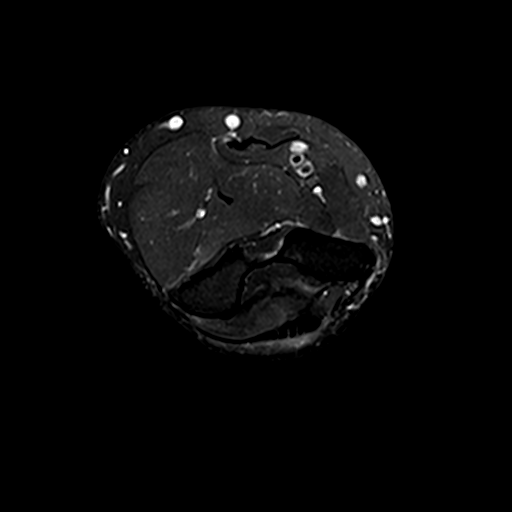
[im 7/21]
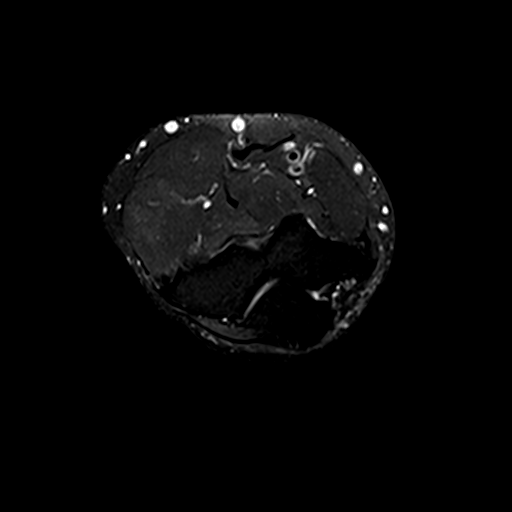
[im 11/21]
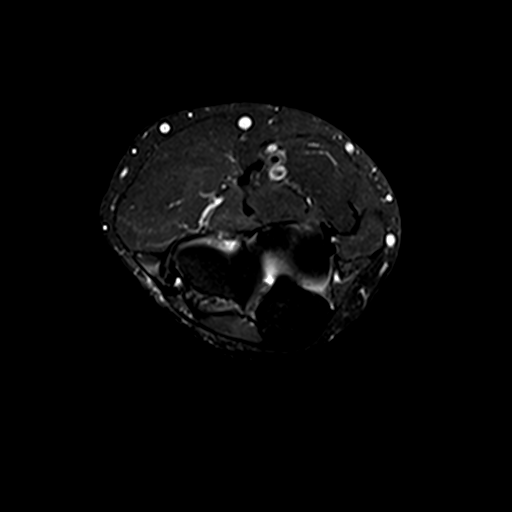
[im 19/21]
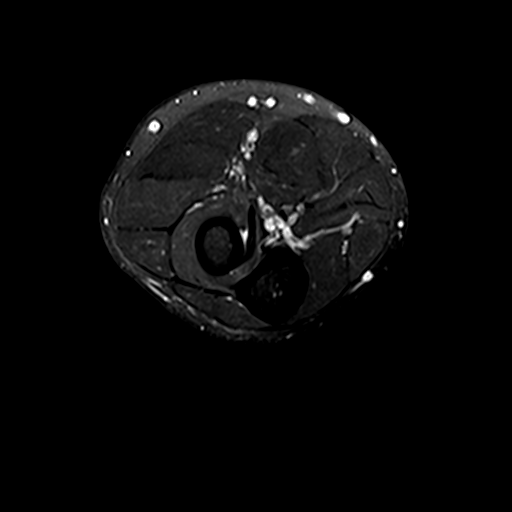

[Series 7: T2 fat-sat · coronal · 4.0mm · 0.29mm/px · 3 of 18 slices shown (2 of 2)]
[im 3/18]
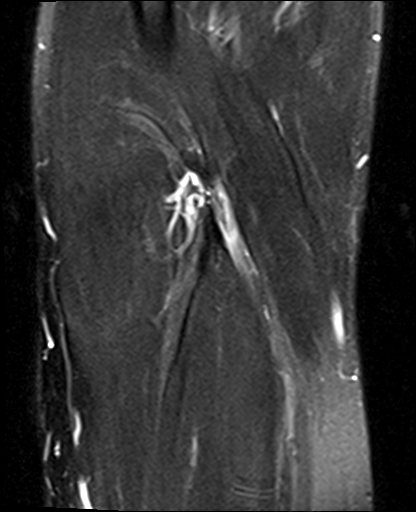
[im 9/18]
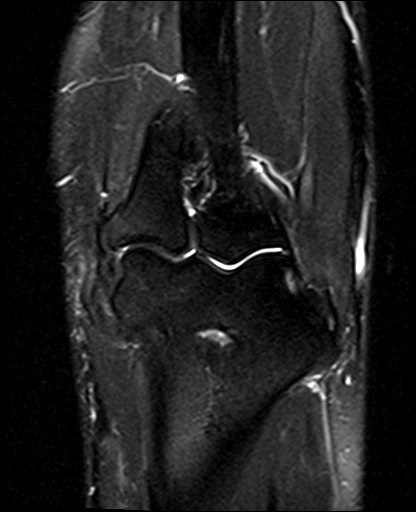
[im 15/18]
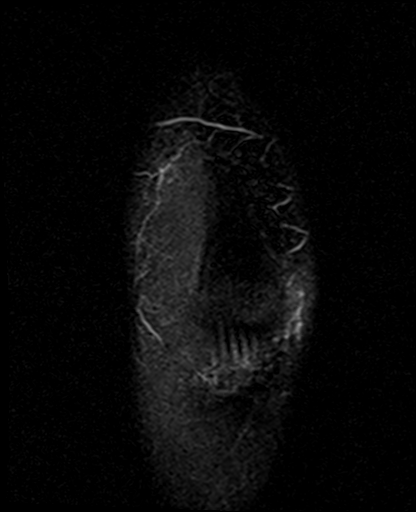

[Series 8: PD fat-sat · sagittal · 4.0mm · 0.23mm/px · 8 of 19 slices shown]
[im 1/19]
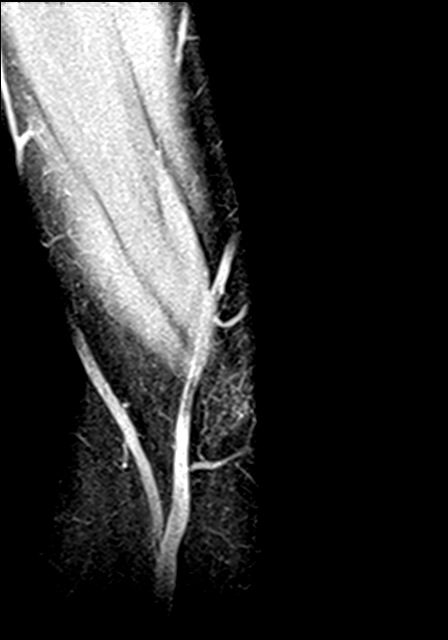
[im 3/19]
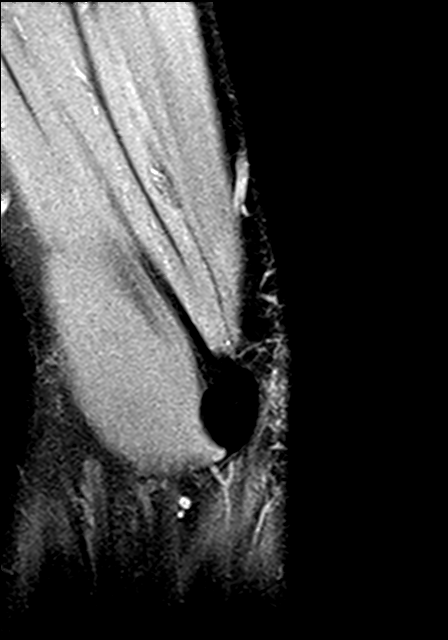
[im 7/19]
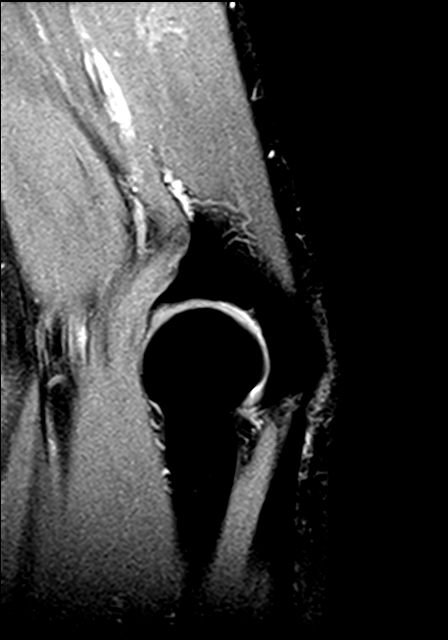
[im 9/19]
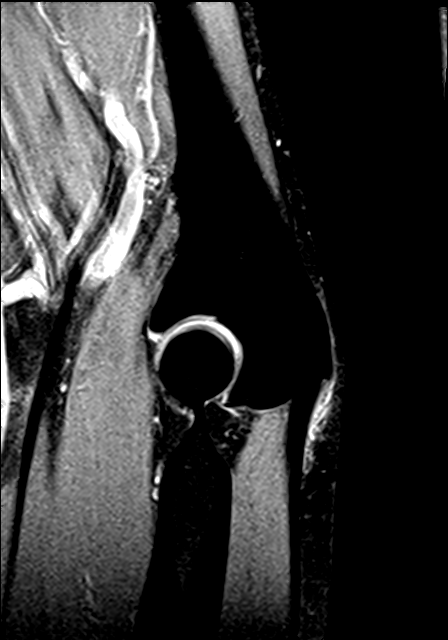
[im 11/19]
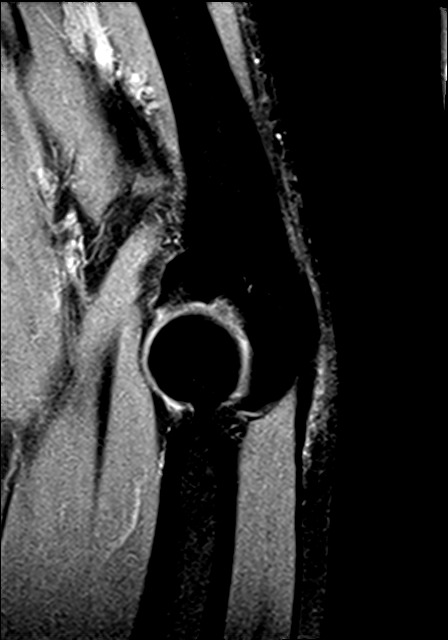
[im 13/19]
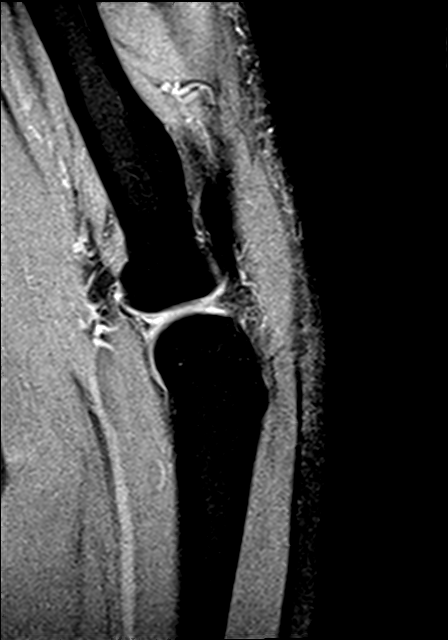
[im 17/19]
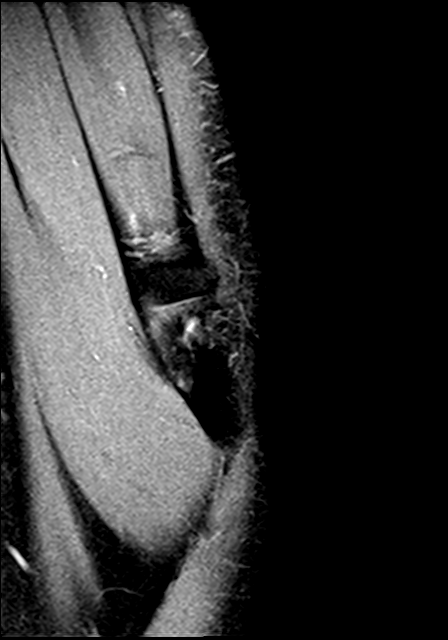
[im 19/19]
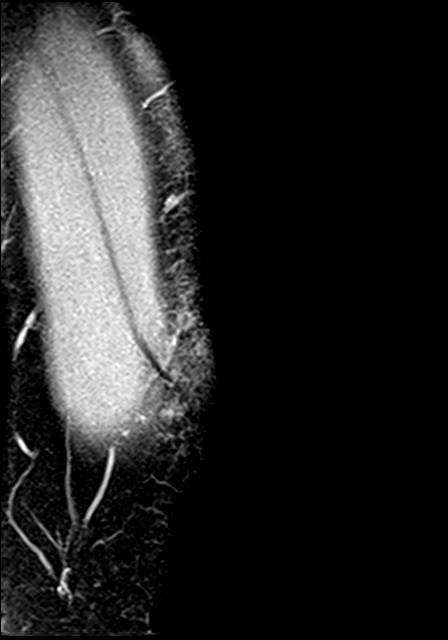

[20 of 40 positions shown; findings below may reference images not displayed]

FINDINGS: TENDONS

Common forearm flexor origin: Minimal tendinosis of the origin of
the common flexor tendon.

Common forearm extensor origin: Mild tendinosis and a small partial
tear of the origin of the common extensor tendon.

Biceps: Intact.

Triceps: Intact.

LIGAMENTS

Medial stabilizers: Intact.

Lateral stabilizers:  Intact.

Cartilage: No chondral defect.

Joint: No joint effusion.  No dislocation.

Cubital tunnel: Normal.

Bones: No marrow signal abnormality.  No fracture.
IMPRESSION: 1. Mild tendinosis and a small partial tear of the origin of the
common extensor tendon.
2. Minimal tendinosis of the origin of the common flexor tendon
without a tear.

## 2016-11-18 ENCOUNTER — Encounter: Payer: Self-pay | Admitting: Family Medicine

## 2016-11-18 ENCOUNTER — Ambulatory Visit (INDEPENDENT_AMBULATORY_CARE_PROVIDER_SITE_OTHER): Payer: Medicare Other | Admitting: Family Medicine

## 2016-11-18 DIAGNOSIS — E785 Hyperlipidemia, unspecified: Secondary | ICD-10-CM

## 2016-11-18 DIAGNOSIS — I10 Essential (primary) hypertension: Secondary | ICD-10-CM | POA: Diagnosis not present

## 2016-11-18 DIAGNOSIS — J301 Allergic rhinitis due to pollen: Secondary | ICD-10-CM

## 2016-11-18 DIAGNOSIS — J309 Allergic rhinitis, unspecified: Secondary | ICD-10-CM | POA: Insufficient documentation

## 2016-11-18 DIAGNOSIS — M1A09X0 Idiopathic chronic gout, multiple sites, without tophus (tophi): Secondary | ICD-10-CM

## 2016-11-18 DIAGNOSIS — Z794 Long term (current) use of insulin: Secondary | ICD-10-CM

## 2016-11-18 DIAGNOSIS — I739 Peripheral vascular disease, unspecified: Secondary | ICD-10-CM

## 2016-11-18 DIAGNOSIS — E119 Type 2 diabetes mellitus without complications: Secondary | ICD-10-CM

## 2016-11-18 LAB — CBC WITH DIFFERENTIAL/PLATELET
BASOS ABS: 0 10*3/uL (ref 0.0–0.1)
Basophils Relative: 0.2 % (ref 0.0–3.0)
EOS PCT: 4.9 % (ref 0.0–5.0)
Eosinophils Absolute: 0.2 10*3/uL (ref 0.0–0.7)
HEMATOCRIT: 43.6 % (ref 39.0–52.0)
Hemoglobin: 14.1 g/dL (ref 13.0–17.0)
LYMPHS PCT: 31.2 % (ref 12.0–46.0)
Lymphs Abs: 1.3 10*3/uL (ref 0.7–4.0)
MCHC: 32.4 g/dL (ref 30.0–36.0)
MCV: 88.1 fl (ref 78.0–100.0)
MONOS PCT: 7.2 % (ref 3.0–12.0)
Monocytes Absolute: 0.3 10*3/uL (ref 0.1–1.0)
NEUTROS ABS: 2.3 10*3/uL (ref 1.4–7.7)
Neutrophils Relative %: 56.5 % (ref 43.0–77.0)
PLATELETS: 159 10*3/uL (ref 150.0–400.0)
RBC: 4.95 Mil/uL (ref 4.22–5.81)
RDW: 13.3 % (ref 11.5–15.5)
WBC: 4.1 10*3/uL (ref 4.0–10.5)

## 2016-11-18 LAB — LIPID PANEL
CHOL/HDL RATIO: 3
Cholesterol: 115 mg/dL (ref 0–200)
HDL: 34.7 mg/dL — AB (ref >39.00)
LDL Cholesterol: 66 mg/dL (ref 0–99)
NONHDL: 80.32
Triglycerides: 72 mg/dL (ref 0.0–149.0)
VLDL: 14.4 mg/dL (ref 0.0–40.0)

## 2016-11-18 LAB — HEMOGLOBIN A1C: Hgb A1c MFr Bld: 7.7 % — ABNORMAL HIGH (ref 4.6–6.5)

## 2016-11-18 MED ORDER — AZELASTINE HCL 0.1 % NA SOLN: 1.0000 | Freq: Two times a day (BID) | NASAL | 5 refills | Status: DC

## 2016-11-18 NOTE — Assessment & Plan Note (Signed)
Continued mild claudication. No worsening. Continue to modify risk factors.

## 2016-11-18 NOTE — Patient Instructions (Signed)
Allergies flared up causing cough. Add astelin nasal spray to current regimen. If no better 1 month or worsens see [place] back.   Please stop by lab before you go  If a1c below 8 can keep working on diet/exercise- otherwise we are going to have to increase medicine  We want a1c below 7 if possible to protect your eyes and kidneys

## 2016-11-18 NOTE — Assessment & Plan Note (Addendum)
S: controlled on Chlorthalidone 25mg , lisinopril 40, metoprolol 100mg , nifedipine 30mg  XL.  ASCVD 10 year risk calculation if age 75-79: elevated but already on statin BP Readings from Last 3 Encounters:  11/18/16 138/68  08/20/16 126/62  07/02/16 132/62  A/P: We discussed blood pressure goal of <140/90. Continue current meds:  As well controlled

## 2016-11-18 NOTE — Progress Notes (Signed)
Subjective:  Matthew [name] is a 76 y.o. year old very pleasant male patient who presents for/with See problem oriented charting ROS- some coughing and runny nose. No chest pain or shortness of breath. No headache or blurry vision.  No low blood sugars.    Past Medical History-  Patient Active Problem List   Diagnosis Date Noted  . Peripheral vascular disease-claudicatoin 05/14/2007    Priority: High  . Diabetes mellitus type II, controlled ([place]) 12/28/2006    Priority: High  .  Coronary artery disease s/p CABG 1997 12/28/2006    Priority: High  . Cervical arthritis ([place]) 10/25/2014    Priority: Medium  . Erectile dysfunction 08/13/2010    Priority: Medium  . CKD (chronic kidney disease), stage III 09/15/2008    Priority: Medium  . Hyperlipidemia 12/28/2006    Priority: Medium  . GOUT 12/28/2006    Priority: Medium  . Essential hypertension 12/28/2006    Priority: Medium  . Pulmonary nodule seen on imaging study 05/17/2012    Priority: Low  . Allergic rhinitis 11/18/2016    Medications- reviewed and updated Current Outpatient Prescriptions  Medication Sig Dispense Refill  . aspirin 81 MG tablet Take 81 mg by mouth daily.      Marland Kitchen atorvastatin (LIPITOR) 40 MG tablet TAKE ONE TABLET BY MOUTH ONCE DAILY 90 tablet 3  . chlorthalidone (HYGROTON) 25 MG tablet TAKE ONE TABLET BY MOUTH ONCE DAILY 90 tablet 2  . colchicine (COLCRYS) 0.6 MG tablet Take 0.6 mg by mouth daily.    . fexofenadine (ALLEGRA) 180 MG tablet Take 180 mg by mouth daily.    . fluticasone (FLONASE) 50 MCG/ACT nasal spray USE 2 SPRAYS IN EACH NOSTRIL DAILY AS NEEDED FOR ALLERGIES OR RHINITIS (NASAL CONGESTION) 16 g 5  . glimepiride (AMARYL) 1 MG tablet TAKE ONE TABLET BY MOUTH ONCE DAILY BEFORE BREAKFAST 90 tablet 1  . glucose 4 GM chewable tablet Chew 1 tablet by mouth once as needed for low blood sugar. Reported on 12/26/2015    . insulin NPH-regular Human (NOVOLIN 70/30 RELION) (70-30) 100 UNIT/ML injection  Inject 22 Units in the morning and 20 Units in the evening SQ 10 mL 3  . lisinopril (PRINIVIL,ZESTRIL) 40 MG tablet TAKE ONE TABLET BY MOUTH ONCE DAILY 90 tablet 3  . metoprolol succinate (TOPROL-XL) 100 MG 24 hr tablet TAKE 1 TABLET EVERY DAY 90 tablet 3  . NIFEdipine (PROCARDIA-XL) 30 MG (OSM) 24 hr tablet Take 30 mg by mouth 2 (two) times daily.      . Omega-3 Fatty Acids (FISH OIL PO) Take 1,400 mg by mouth.    . pioglitazone (ACTOS) 30 MG tablet TAKE ONE TABLET BY MOUTH ONCE DAILY 90 tablet 3   No current facility-administered medications for this visit.     Objective: BP 138/68   Pulse (!) 52   Temp 97.6 F (36.4 C) (Oral)   Ht 6' (1.829 m)   Wt 195 lb 3.2 oz (88.5 kg)   BMI 26.47 kg/m  Gen: NAD, resting comfortably Nares with clear discharge, cobblestoning in pharynx, some allergic shiners CV: RRR no murmurs rubs or gallops Lungs: CTAB no crackles, wheeze, rhonchi Abdomen: soft/nontender/nondistended/normal bowel sounds. No rebound or guarding.  Ext: no edema Skin: warm, dry  Assessment/Plan:  Diabetes mellitus type II, controlled ([place]) S: mild poorly controlled. On same meds as last visit actos 30mg , amaryl 1mg , insulin 70/30 22 units in AM and 20 units in PM Metformin causes Gi upset. He wanted to work  on diet/exercise. Has lost 3 lbs since that visit- slightly tightened up diet Lab Results  Component Value Date   HGBA1C 8.0 (H) 08/12/2016   HGBA1C 7.5 (H) 12/25/2015   HGBA1C 7.3 (H) 06/29/2015   A/P: updated a1c down to 7.7- he wants to continue to work on weight loss and healthy eating without changing meds. Not sure how much amaryl helping- discuss further next visit  Hyperlipidemia S: well controlled on atorvastatin 40mg . No myalgias.  Lab Results  Component Value Date   CHOL 115 11/18/2016   HDL 34.70 (L) 11/18/2016   LDLCALC 66 11/18/2016   LDLDIRECT 79.0 07/19/2015   TRIG 72.0 11/18/2016   CHOLHDL 3 11/18/2016   A/P: updated lipids very reasonable-  continue current meds  Essential hypertension S: controlled on Chlorthalidone 25mg , lisinopril 40, metoprolol 100mg , nifedipine 30mg  XL.  ASCVD 10 year risk calculation if age 43-79: elevated but already on statin BP Readings from Last 3 Encounters:  11/18/16 138/68  08/20/16 126/62  07/02/16 132/62  A/P: We discussed blood pressure goal of <140/90. Continue current meds:  As well controlled  Allergic rhinitis S:Cough for 2 weeks. On his allegra and flonase. Sneezing along with it. Some watery itchy eyes but not much. Runny nose.  A/P: will add astelin to see if it helps him further. Could consider singulair. Prednisone an option but with diabetes want ot avoid unless absolutely unable  Peripheral vascular disease-claudicatoin Continued mild claudication. No worsening. Continue to modify risk factors.  we discussed if not affordable could just wait this out as well  14 week follow up unless a1c below 7  Meds ordered this encounter  Medications  . azelastine (ASTELIN) 0.1 % nasal spray    Sig: Place 1 spray into both nostrils 2 (two) times daily. Use in each nostril as directed    Dispense:  30 mL    Refill:  5    Return precautions advised.  Garret Reddish, MD

## 2016-11-18 NOTE — Assessment & Plan Note (Signed)
S:Cough for 2 weeks. On his allegra and flonase. Sneezing along with it. Some watery itchy eyes but not much. Runny nose.  A/P: will add astelin to see if it helps him further. Could consider singulair. Prednisone an option but with diabetes want ot avoid unless absolutely unable

## 2016-11-18 NOTE — Assessment & Plan Note (Signed)
S: well controlled on atorvastatin 40mg . No myalgias.  Lab Results  Component Value Date   CHOL 115 11/18/2016   HDL 34.70 (L) 11/18/2016   LDLCALC 66 11/18/2016   LDLDIRECT 79.0 07/19/2015   TRIG 72.0 11/18/2016   CHOLHDL 3 11/18/2016   A/P: updated lipids very reasonable- continue current meds

## 2016-11-18 NOTE — Assessment & Plan Note (Signed)
S: mild poorly controlled. On same meds as last visit actos 30mg , amaryl 1mg , insulin 70/30 22 units in AM and 20 units in PM Metformin causes Gi upset. He wanted to work on diet/exercise. Has lost 3 lbs since that visit- slightly tightened up diet Lab Results  Component Value Date   HGBA1C 8.0 (H) 08/12/2016   HGBA1C 7.5 (H) 12/25/2015   HGBA1C 7.3 (H) 06/29/2015   A/P: updated a1c down to 7.7- he wants to continue to work on weight loss and healthy eating without changing meds. Not sure how much amaryl helping- discuss further next visit

## 2016-12-12 ENCOUNTER — Other Ambulatory Visit: Payer: Self-pay | Admitting: Family Medicine

## 2016-12-12 NOTE — Telephone Encounter (Signed)
Yes thanks, may fill but no refills and if he reaches back out within a month lets see him for office visit

## 2016-12-15 NOTE — Telephone Encounter (Signed)
See note below

## 2016-12-30 DIAGNOSIS — H25813 Combined forms of age-related cataract, bilateral: Secondary | ICD-10-CM | POA: Diagnosis not present

## 2016-12-30 DIAGNOSIS — E119 Type 2 diabetes mellitus without complications: Secondary | ICD-10-CM | POA: Diagnosis not present

## 2016-12-30 LAB — HM DIABETES EYE EXAM

## 2017-01-08 ENCOUNTER — Encounter: Payer: Self-pay | Admitting: Family Medicine

## 2017-01-16 ENCOUNTER — Other Ambulatory Visit: Payer: Self-pay | Admitting: Family Medicine

## 2017-02-19 DIAGNOSIS — Z23 Encounter for immunization: Secondary | ICD-10-CM | POA: Diagnosis not present

## 2017-02-25 ENCOUNTER — Ambulatory Visit: Payer: Self-pay | Admitting: Family Medicine

## 2017-03-19 ENCOUNTER — Encounter: Payer: Self-pay | Admitting: Family Medicine

## 2017-03-19 ENCOUNTER — Ambulatory Visit (INDEPENDENT_AMBULATORY_CARE_PROVIDER_SITE_OTHER): Payer: Medicare Other | Admitting: Family Medicine

## 2017-03-19 VITALS — BP 120/58 | HR 53 | Temp 97.8°F | Ht 72.0 in | Wt 194.8 lb

## 2017-03-19 DIAGNOSIS — Z794 Long term (current) use of insulin: Secondary | ICD-10-CM | POA: Diagnosis not present

## 2017-03-19 DIAGNOSIS — I10 Essential (primary) hypertension: Secondary | ICD-10-CM | POA: Diagnosis not present

## 2017-03-19 DIAGNOSIS — M47812 Spondylosis without myelopathy or radiculopathy, cervical region: Secondary | ICD-10-CM

## 2017-03-19 DIAGNOSIS — M4692 Unspecified inflammatory spondylopathy, cervical region: Secondary | ICD-10-CM | POA: Diagnosis not present

## 2017-03-19 DIAGNOSIS — E119 Type 2 diabetes mellitus without complications: Secondary | ICD-10-CM

## 2017-03-19 DIAGNOSIS — J301 Allergic rhinitis due to pollen: Secondary | ICD-10-CM | POA: Diagnosis not present

## 2017-03-19 LAB — POCT GLYCOSYLATED HEMOGLOBIN (HGB A1C): HEMOGLOBIN A1C: 6.8

## 2017-03-19 NOTE — Assessment & Plan Note (Signed)
Has done very well recently- not even using muscle relaxant- he is thrilled with this.

## 2017-03-19 NOTE — Assessment & Plan Note (Signed)
S: last visit- was on allegra and flonase. Still having sneezing, some watery eyes, runny nose. We added astelin to see if that would help. Discussed singulair option if not helpful.  A/P: this really helped with flare- now he is back to allegra and flonase and not always taking them. Using saline solution and that really helps

## 2017-03-19 NOTE — Assessment & Plan Note (Addendum)
S: mild poorly controlled on insulin 70/30 22 units in AM and 20 units in PM- adjusts mildly at times. Does not tolerate metformin due to GI upset. On actos 30mg  and amaryl 1mg  (coudl consider taking off this). Last visit lost 3 lbs- wanted to continue to work on diet/exercise without med changes.   Today, weight is down another pound and he has continued healthy habits. Has decreased carbs substantially Lab Results  Component Value Date   HGBA1C 6.8 03/19/2017   HGBA1C 7.7 (H) 11/18/2016   HGBA1C 8.0 (H) 08/12/2016   A/P: drastic improvement- continue current meds and efforts

## 2017-03-19 NOTE — Assessment & Plan Note (Signed)
S: controlled on  chlorthalidone 25mg , lisinopril 40mg , metoprolol 100mg , nifedipine 30mg  XL.  BP Readings from Last 3 Encounters:  03/19/17 (!) 120/58  11/18/16 138/68  08/20/16 126/62  A/P: We discussed blood pressure goal of <140/90 given diabetes. Continue current meds

## 2017-03-19 NOTE — Patient Instructions (Addendum)
Thanks for getting your flu shot already!   No changes today  Lab Results  Component Value Date   HGBA1C 6.8 03/19/2017  Great job on this! Keep up the healthier diet and current meds  Discussed picking up exercise as weather cools off

## 2017-03-19 NOTE — Progress Notes (Signed)
Subjective:  Matthew [name] is a 76 y.o. year old very pleasant male patient who presents for/with See problem oriented charting ROS- very rare hypoglycemia- nothing under 70- down to 73 last night- able to quickly responded. No chest pain or shortness of breath. No headache or blurry vision.    Past Medical History-  Patient Active Problem List   Diagnosis Date Noted  . Peripheral vascular disease-claudicatoin 05/14/2007    Priority: High  . Diabetes mellitus type II, controlled ([place]) 12/28/2006    Priority: High  .  Coronary artery disease s/p CABG 1997 12/28/2006    Priority: High  . Cervical arthritis ([place]) 10/25/2014    Priority: Medium  . Erectile dysfunction 08/13/2010    Priority: Medium  . CKD (chronic kidney disease), stage III 09/15/2008    Priority: Medium  . Hyperlipidemia 12/28/2006    Priority: Medium  . GOUT 12/28/2006    Priority: Medium  . Essential hypertension 12/28/2006    Priority: Medium  . Pulmonary nodule seen on imaging study 05/17/2012    Priority: Low  . Allergic rhinitis 11/18/2016    Medications- reviewed and updated Current Outpatient Prescriptions  Medication Sig Dispense Refill  . aspirin 81 MG tablet Take 81 mg by mouth daily.      Marland Kitchen atorvastatin (LIPITOR) 40 MG tablet TAKE ONE TABLET BY MOUTH ONCE DAILY 90 tablet 3  . azelastine (ASTELIN) 0.1 % nasal spray Place 1 spray into both nostrils 2 (two) times daily. Use in each nostril as directed 30 mL 5  . chlorthalidone (HYGROTON) 25 MG tablet TAKE ONE TABLET BY MOUTH ONCE DAILY 90 tablet 2  . colchicine (COLCRYS) 0.6 MG tablet Take 0.6 mg by mouth daily.    . cyclobenzaprine (FLEXERIL) 5 MG tablet TAKE ONE TABLET BY MOUTH TWICE DAILY AS NEEDED FOR MUSCLE SPASM 30 tablet 0  . fexofenadine (ALLEGRA) 180 MG tablet Take 180 mg by mouth daily.    . fluticasone (FLONASE) 50 MCG/ACT nasal spray USE 2 SPRAYS IN EACH NOSTRIL DAILY AS NEEDED FOR ALLERGIES OR RHINITIS (NASAL CONGESTION) 16 g 5  .  glimepiride (AMARYL) 1 MG tablet TAKE ONE TABLET BY MOUTH ONCE DAILY BEFORE BREAKFAST 90 tablet 1  . glucose 4 GM chewable tablet Chew 1 tablet by mouth once as needed for low blood sugar. Reported on 12/26/2015    . lisinopril (PRINIVIL,ZESTRIL) 40 MG tablet TAKE ONE TABLET BY MOUTH ONCE DAILY 90 tablet 3  . metoprolol succinate (TOPROL-XL) 100 MG 24 hr tablet TAKE 1 TABLET EVERY DAY 90 tablet 3  . NIFEdipine (PROCARDIA-XL) 30 MG (OSM) 24 hr tablet Take 30 mg by mouth 2 (two) times daily.      Marland Kitchen NOVOLIN 70/30 RELION (70-30) 100 UNIT/ML injection INJECT 22 UNITS SUBCUTANEOUSLY IN THE MORNING 20 IN THE EVENING SUBCUTANEOUSLY 10 mL 3  . Omega-3 Fatty Acids (FISH OIL PO) Take 1,400 mg by mouth.    . pioglitazone (ACTOS) 30 MG tablet TAKE ONE TABLET BY MOUTH ONCE DAILY 90 tablet 3   Objective: BP (!) 120/58 (BP Location: Left Arm, Patient Position: Sitting, Cuff Size: Large)   Pulse (!) 53   Temp 97.8 F (36.6 C) (Oral)   Ht 6' (1.829 m)   Wt 194 lb 12.8 oz (88.4 kg)   SpO2 98%   BMI 26.42 kg/m  Gen: NAD, resting comfortably CV: RRR no murmurs rubs or gallops Lungs: CTAB no crackles, wheeze, rhonchi Abdomen: soft/nontender/nondistended/normal bowel sounds. No rebound or guarding.  Ext: no edema Skin:  warm, dry Neuro: grossly normal, moves all extremities. Normal gait  Assessment/Plan:  Discussed picking up exercise as weather cools off. Particularly in regards to claudication  Diabetes mellitus type II, controlled ([place]) S: mild poorly controlled on insulin 70/30 22 units in AM and 20 units in PM- adjusts mildly at times. Does not tolerate metformin due to GI upset. On actos 30mg  and amaryl 1mg  (coudl consider taking off this). Last visit lost 3 lbs- wanted to continue to work on diet/exercise without med changes.   Today, weight is down another pound and he has continued healthy habits. Has decreased carbs substantially Lab Results  Component Value Date   HGBA1C 6.8 03/19/2017    HGBA1C 7.7 (H) 11/18/2016   HGBA1C 8.0 (H) 08/12/2016   A/P: drastic improvement- continue current meds and efforts  Essential hypertension S: controlled on  chlorthalidone 25mg , lisinopril 40mg , metoprolol 100mg , nifedipine 30mg  XL.  BP Readings from Last 3 Encounters:  03/19/17 (!) 120/58  11/18/16 138/68  08/20/16 126/62  A/P: We discussed blood pressure goal of <140/90 given diabetes. Continue current meds  Allergic rhinitis S: last visit- was on allegra and flonase. Still having sneezing, some watery eyes, runny nose. We added astelin to see if that would help. Discussed singulair option if not helpful.  A/P: this really helped with flare- now he is back to allegra and flonase and not always taking them. Using saline solution and that really helps  Cervical arthritis ([place]) Has done very well recently- not even using muscle relaxant- he is thrilled with this.   Return in about 4 months (around 07/19/2017) for follow up- or sooner if needed.  Orders Placed This Encounter  Procedures  . POCT glycosylated hemoglobin (Hb A1C)   Return precautions advised.  Garret Reddish, MD

## 2017-03-23 ENCOUNTER — Ambulatory Visit
Admission: RE | Admit: 2017-03-23 | Discharge: 2017-03-23 | Disposition: A | Discharge status: Home / Self Care | Payer: Medicare Other | Source: Ambulatory Visit | Attending: Nephrology | Admitting: Nephrology

## 2017-03-23 ENCOUNTER — Other Ambulatory Visit: Payer: Self-pay | Admitting: Nephrology

## 2017-03-23 DIAGNOSIS — E119 Type 2 diabetes mellitus without complications: Secondary | ICD-10-CM | POA: Diagnosis not present

## 2017-03-23 DIAGNOSIS — R0989 Other specified symptoms and signs involving the circulatory and respiratory systems: Secondary | ICD-10-CM

## 2017-03-23 DIAGNOSIS — N183 Chronic kidney disease, stage 3 (moderate): Secondary | ICD-10-CM | POA: Diagnosis not present

## 2017-03-23 DIAGNOSIS — E669 Obesity, unspecified: Secondary | ICD-10-CM | POA: Diagnosis not present

## 2017-03-23 DIAGNOSIS — R918 Other nonspecific abnormal finding of lung field: Secondary | ICD-10-CM | POA: Diagnosis not present

## 2017-03-23 DIAGNOSIS — I10 Essential (primary) hypertension: Secondary | ICD-10-CM | POA: Diagnosis not present

## 2017-03-23 DIAGNOSIS — M109 Gout, unspecified: Secondary | ICD-10-CM | POA: Diagnosis not present

## 2017-03-23 DIAGNOSIS — M47812 Spondylosis without myelopathy or radiculopathy, cervical region: Secondary | ICD-10-CM | POA: Diagnosis not present

## 2017-03-23 DIAGNOSIS — E8770 Fluid overload, unspecified: Secondary | ICD-10-CM | POA: Diagnosis not present

## 2017-05-07 ENCOUNTER — Encounter: Payer: Self-pay | Admitting: Cardiovascular Disease

## 2017-05-07 ENCOUNTER — Ambulatory Visit (INDEPENDENT_AMBULATORY_CARE_PROVIDER_SITE_OTHER): Payer: Medicare Other | Admitting: Cardiovascular Disease

## 2017-05-07 VITALS — BP 140/60 | HR 54 | Ht 72.0 in | Wt 195.8 lb

## 2017-05-07 DIAGNOSIS — I2510 Atherosclerotic heart disease of native coronary artery without angina pectoris: Secondary | ICD-10-CM

## 2017-05-07 DIAGNOSIS — I739 Peripheral vascular disease, unspecified: Secondary | ICD-10-CM

## 2017-05-07 DIAGNOSIS — I10 Essential (primary) hypertension: Secondary | ICD-10-CM

## 2017-05-07 DIAGNOSIS — E785 Hyperlipidemia, unspecified: Secondary | ICD-10-CM

## 2017-05-07 NOTE — Patient Instructions (Signed)

## 2017-05-07 NOTE — Progress Notes (Signed)
Cardiology Office Note Date:  05/09/2017   ID:  Matthew [name], Matthew [name] Jan 08, 1941, MRN [id number]  PCP:  Marin Olp, MD  Cardiologist:  Sherren Mocha, MD    Chief Complaint  Patient presents with  . Follow-up    cad     History of Present Illness: Matthew [name] is a 76 y.o. male who presents for follow-up of coronary artery disease.  The patient underwent remote CABG in 1997.  Also has peripheral arterial disease with bilateral SFA occlusion managed medically.  His last nuclear stress test in 2013 showed normal perfusion with an LVEF of 60%.  The patient is here alone today.  He is doing well and reports no change in symptoms.  He is able to exercise without dyspnea or chest pain.  He has chronic swelling of the right leg after saphenous vein graft harvesting done remotely.  No swelling of the left leg.  No leg pain with ambulation at present.   Past Medical History:  Diagnosis Date  . CLAUDICATION 05/14/2007  . CORONARY ARTERY DISEASE 12/28/2006  . Diabetes mellitus type II, controlled ([place]) 12/28/2006   Actos 30mg , insulin 70/30 20 units BID, amaryl 1mg  previously a1c <8. Worsened due to cold over 5 weeks around 05/2014 eating poorly and eating sugary syrups.   Stomach irritation on metformin.  Lab Results  Component Value Date   HGBA1C 8.7* 06/05/2014       . DIABETES MELLITUS, TYPE II 12/28/2006  . Duodenal ulcer   . Erosive gastritis   . GERD (gastroesophageal reflux disease)   . GOUT 12/28/2006  . Hemorrhoids   . Hiatal hernia   . HYPERLIPIDEMIA 12/28/2006  . HYPERTENSION 12/28/2006  . LATERAL EPICONDYLITIS, RIGHT 12/11/2009  . Raynaud's disease   . RENAL FAILURE, CHRONIC 09/15/2008    Past Surgical History:  Procedure Laterality Date  . COLONOSCOPY    . CORONARY ARTERY BYPASS GRAFT    . KNEE ARTHROSCOPY  2012    Current Outpatient Medications  Medication Sig Dispense Refill  . aspirin 81 MG tablet Take 81 mg by mouth daily.      Marland Kitchen atorvastatin (LIPITOR)  40 MG tablet TAKE ONE TABLET BY MOUTH ONCE DAILY 90 tablet 3  . azelastine (ASTELIN) 0.1 % nasal spray Place 1 spray into both nostrils 2 (two) times daily. Use in each nostril as directed 30 mL 5  . chlorthalidone (HYGROTON) 25 MG tablet TAKE ONE TABLET BY MOUTH ONCE DAILY 90 tablet 2  . colchicine (COLCRYS) 0.6 MG tablet Take 0.6 mg by mouth daily.    . cyclobenzaprine (FLEXERIL) 5 MG tablet TAKE ONE TABLET BY MOUTH TWICE DAILY AS NEEDED FOR MUSCLE SPASM 30 tablet 0  . fexofenadine (ALLEGRA) 180 MG tablet Take 180 mg by mouth daily.    . fluticasone (FLONASE) 50 MCG/ACT nasal spray USE 2 SPRAYS IN EACH NOSTRIL DAILY AS NEEDED FOR ALLERGIES OR RHINITIS (NASAL CONGESTION) 16 g 5  . glimepiride (AMARYL) 1 MG tablet TAKE ONE TABLET BY MOUTH ONCE DAILY BEFORE BREAKFAST 90 tablet 1  . glucose 4 GM chewable tablet Chew 1 tablet by mouth once as needed for low blood sugar. Reported on 12/26/2015    . lisinopril (PRINIVIL,ZESTRIL) 40 MG tablet TAKE ONE TABLET BY MOUTH ONCE DAILY 90 tablet 3  . metoprolol succinate (TOPROL XL) 100 MG 24 hr tablet Take 100 mg daily by mouth. Take with or immediately following a meal.    . NIFEdipine (PROCARDIA-XL) 30 MG (OSM) 24 hr tablet  Take 30 mg by mouth 2 (two) times daily.      Marland Kitchen NOVOLIN 70/30 RELION (70-30) 100 UNIT/ML injection INJECT 22 UNITS SUBCUTANEOUSLY IN THE MORNING 20 IN THE EVENING SUBCUTANEOUSLY 10 mL 3  . Omega-3 Fatty Acids (FISH OIL PO) Take 1,400 mg by mouth.    . pioglitazone (ACTOS) 30 MG tablet TAKE ONE TABLET BY MOUTH ONCE DAILY 90 tablet 3   No current facility-administered medications for this visit.     Allergies:   Penicillins and Tetracycline hcl   Social History:  The patient  reports that he quit smoking about 48 years ago. His smoking use included cigarettes. He quit smokeless tobacco use about 28 years ago. His smokeless tobacco use included chew. He reports that he does not drink alcohol or use drugs.   Family History:  The patient's  family history includes Diabetes in his mother; Hypertension in his mother; Stroke in his father.   ROS:  Please see the history of present illness.  Otherwise, review of systems is positive for chills, leg swelling.  All other systems are reviewed and negative.   PHYSICAL EXAM: VS:  BP 140/60   Pulse (!) 54   Ht 6' (1.829 m)   Wt 195 lb 12.8 oz (88.8 kg)   BMI 26.56 kg/m   , BMI Body mass index is 26.56 kg/m. GEN: Well nourished, well developed, in no acute distress  HEENT: normal  Neck: no JVD, no masses. No carotid bruits Cardiac: RRR without murmur or gallop                Respiratory:  clear to auscultation bilaterally, normal work of breathing GI: soft, nontender, nondistended, + BS MS: no deformity or atrophy  Ext: 1+ right pretibial edema Skin: warm and dry, no rash Neuro:  Strength and sensation are intact Psych: euthymic mood, full affect  EKG:  EKG is ordered today. The ekg ordered today shows sinus brady 54 bpm, otherwise normal  Recent Labs: 11/18/2016: Hemoglobin 14.1; Platelets 159.0   Lipid Panel     Component Value Date/Time   CHOL 115 11/18/2016 1110   TRIG 72.0 11/18/2016 1110   HDL 34.70 (L) 11/18/2016 1110   CHOLHDL 3 11/18/2016 1110   VLDL 14.4 11/18/2016 1110   LDLCALC 66 11/18/2016 1110   LDLDIRECT 79.0 07/19/2015 1107      Wt Readings from Last 3 Encounters:  05/07/17 195 lb 12.8 oz (88.8 kg)  03/19/17 194 lb 12.8 oz (88.4 kg)  11/18/16 195 lb 3.2 oz (88.5 kg)     ASSESSMENT AND PLAN: 1.  Coronary artery disease, native vessel, without angina: The patient is stable from a cardiac perspective.  He will continue on aspirin, atorvastatin, and ACE inhibitor in the setting of diabetes, and a beta-blocker.  2.  Hypertension: Blood pressure is controlled on chlorthalidone, lisinopril, metoprolol succinate, and nifedipine.  3.  Hyperlipidemia: Treated with atorvastatin 40 mg daily.Most recent lipids reviewed with a cholesterol 115, HDL 35, LDL 66,  and triglycerides 72.  4.  Type 2 diabetes: Managed with oral hypoglycemic agents.  Treated by primary care physician.  Current medicines are reviewed with the patient today.  The patient does not have concerns regarding medicines.  Labs/ tests ordered today include:   Orders Placed This Encounter  Procedures  . EKG 12-Lead    Disposition:   FU one year  Signed, Sherren Mocha, MD  05/09/2017 7:35 AM    [address],  , [employer] [facility] [place]  [address] Phone: [phone]; Fax: [phone]  

## 2017-05-09 ENCOUNTER — Encounter: Payer: Self-pay | Admitting: Cardiovascular Disease

## 2017-05-11 ENCOUNTER — Ambulatory Visit (INDEPENDENT_AMBULATORY_CARE_PROVIDER_SITE_OTHER): Payer: Medicare Other

## 2017-05-11 ENCOUNTER — Encounter: Payer: Self-pay | Admitting: Family Medicine

## 2017-05-11 ENCOUNTER — Ambulatory Visit (INDEPENDENT_AMBULATORY_CARE_PROVIDER_SITE_OTHER): Payer: Medicare Other | Admitting: Family Medicine

## 2017-05-11 VITALS — BP 120/70 | HR 83 | Ht 72.0 in

## 2017-05-11 DIAGNOSIS — M79644 Pain in right finger(s): Secondary | ICD-10-CM | POA: Diagnosis not present

## 2017-05-11 DIAGNOSIS — M7989 Other specified soft tissue disorders: Secondary | ICD-10-CM | POA: Diagnosis not present

## 2017-05-11 NOTE — Progress Notes (Signed)
    Subjective:  Matthew [name] is a 76 y.o. male who presents today with a chief complaint of finger pain.   HPI:  Right index finger pain, acute issue Pain started 2 weeks ago after he smashed his finger in a recliner chair.  He noticed pain and swelling initially however this gradually improved over the course of a week to week and half.  Symptoms have worsened over the last 3 days.  He has noticed more pain.  No weakness or numbness in the area.  No tingling sensation.  No treatments tried at home.  No fevers or chills.    ROS: Per HPI  Objective:  Physical Exam: BP 120/70   Pulse 83   Ht 6' (1.829 m)   SpO2 96%   BMI 26.56 kg/m   Gen: NAD, resting comfortably MSK: -Right index finger: Grossly edematous with ecchymosis on distal phalanx.  Range of motion is limited secondary to pain and swelling, however patient is able to touch his thumb.  Cap refill less than 3 seconds distally.  Sensation to light touch intact grossly distally.  Tender to palpation over DIP joint.  No areas of skin breakdown.  Right index finger plain film: No obvious fractures.  Assessment/Plan:  Right index finger pain No obvious signs of infection.  Neurovascularly intact distally-he has normal cap refill and sensation to light touch. He does have a resolving hematoma - reassured patient.  It is possible that he may have precipitated a mild gout flare.  No obvious fractures on his plain film-will await radiology read.  Advised patient to use ice to the area 2-3 times daily.  Also advised use of Tylenol as needed.  Given his CKD, he is not a candidate for NSAIDs.  Strict return precautions reviewed including worsening pain, numbness, and redness.  Algis Greenhouse. Jerline Pain, MD 05/11/2017 12:48 PM

## 2017-05-22 ENCOUNTER — Other Ambulatory Visit: Payer: Self-pay | Admitting: Family Medicine

## 2017-06-05 ENCOUNTER — Ambulatory Visit (INDEPENDENT_AMBULATORY_CARE_PROVIDER_SITE_OTHER): Payer: Medicare Other

## 2017-06-05 ENCOUNTER — Encounter: Payer: Self-pay | Admitting: Family Medicine

## 2017-06-05 ENCOUNTER — Ambulatory Visit (INDEPENDENT_AMBULATORY_CARE_PROVIDER_SITE_OTHER): Payer: Medicare Other | Admitting: Family Medicine

## 2017-06-05 VITALS — BP 130/68 | HR 58 | Temp 97.5°F | Ht 72.0 in | Wt 195.8 lb

## 2017-06-05 DIAGNOSIS — I2510 Atherosclerotic heart disease of native coronary artery without angina pectoris: Secondary | ICD-10-CM | POA: Diagnosis not present

## 2017-06-05 DIAGNOSIS — M1A9XX1 Chronic gout, unspecified, with tophus (tophi): Secondary | ICD-10-CM | POA: Diagnosis not present

## 2017-06-05 DIAGNOSIS — M79644 Pain in right finger(s): Secondary | ICD-10-CM

## 2017-06-05 DIAGNOSIS — S92151A Displaced avulsion fracture (chip fracture) of right talus, initial encounter for closed fracture: Secondary | ICD-10-CM | POA: Diagnosis not present

## 2017-06-05 MED ORDER — COLCHICINE 0.6 MG PO TABS: 0.6000 mg | ORAL_TABLET | Freq: Every day | ORAL | 2 refills | Status: DC

## 2017-06-05 MED ORDER — CEPHALEXIN 500 MG PO CAPS: 500.0000 mg | ORAL_CAPSULE | Freq: Two times a day (BID) | ORAL | 0 refills | Status: AC

## 2017-06-05 NOTE — Progress Notes (Signed)
Subjective:  Matthew [name] is a 76 y.o. year old very pleasant male patient who presents for/with See problem oriented charting ROS- no fever, chills, nausea, vomiting, expanding redness on finger   Past Medical History-  Patient Active Problem List   Diagnosis Date Noted  . Peripheral vascular disease-claudicatoin 05/14/2007    Priority: High  . Diabetes mellitus type II, controlled ([place]) 12/28/2006    Priority: High  .  Coronary artery disease s/p CABG 1997 12/28/2006    Priority: High  . Cervical arthritis ([place]) 10/25/2014    Priority: Medium  . Erectile dysfunction 08/13/2010    Priority: Medium  . CKD (chronic kidney disease), stage III ([place]) 09/15/2008    Priority: Medium  . Hyperlipidemia 12/28/2006    Priority: Medium  . 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 Essential hypertension 12/28/2006    Priority: Medium  . Pulmonary nodule seen on imaging study 05/17/2012    Priority: Low  . Allergic rhinitis 11/18/2016    Medications- reviewed and updated Current Outpatient Medications  Medication Sig Dispense Refill  . aspirin 81 MG tablet Take 81 mg by mouth daily.      Marland Kitchen atorvastatin (LIPITOR) 40 MG tablet TAKE ONE TABLET BY MOUTH ONCE DAILY 90 tablet 3  . azelastine (ASTELIN) 0.1 % nasal spray Place 1 spray into both nostrils 2 (two) times daily. Use in each nostril as directed 30 mL 5  . chlorthalidone (HYGROTON) 25 MG tablet TAKE ONE TABLET BY MOUTH ONCE DAILY 90 tablet 2  . colchicine (COLCRYS) 0.6 MG tablet Take 1 tablet (0.6 mg total) by mouth daily. 30 tablet 2  . cyclobenzaprine (FLEXERIL) 5 MG tablet TAKE ONE TABLET BY MOUTH TWICE DAILY AS NEEDED FOR MUSCLE SPASM 30 tablet 0  . fexofenadine (ALLEGRA) 180 MG tablet Take 180 mg by mouth daily.    . fluticasone (FLONASE) 50 MCG/ACT nasal spray USE 2 SPRAYS IN EACH NOSTRIL DAILY AS NEEDED FOR ALLERGIES OR RHINITIS (NASAL CONGESTION) 16 g 5  . glimepiride (AMARYL) 1 MG tablet TAKE 1 TABLET BY MOUTH ONCE  DAILY BEFORE  BREAKFAST 90 tablet 1  . glucose 4 GM chewable tablet Chew 1 tablet by mouth once as needed for low blood sugar. Reported on 12/26/2015    . lisinopril (PRINIVIL,ZESTRIL) 40 MG tablet TAKE ONE TABLET BY MOUTH ONCE DAILY 90 tablet 3  . metoprolol succinate (TOPROL XL) 100 MG 24 hr tablet Take 100 mg daily by mouth. Take with or immediately following a meal.    . NIFEdipine (PROCARDIA-XL) 30 MG (OSM) 24 hr tablet Take 30 mg by mouth 2 (two) times daily.      Marland Kitchen NOVOLIN 70/30 RELION (70-30) 100 UNIT/ML injection INJECT 22 UNITS SUBCUTANEOUSLY IN THE MORNING 20 IN THE EVENING SUBCUTANEOUSLY 10 mL 3  . Omega-3 Fatty Acids (FISH OIL PO) Take 1,400 mg by mouth.    . pioglitazone (ACTOS) 30 MG tablet TAKE ONE TABLET BY MOUTH ONCE DAILY 90 tablet 3  . cephALEXin (KEFLEX) 500 MG capsule Take 1 capsule (500 mg total) by mouth 2 (two) times daily for 5 days. 10 capsule 0   No current facility-administered medications for this visit.     Objective: BP 130/68 (BP Location: Left Arm, Patient Position: Sitting, Cuff Size: Large)   Pulse (!) 58   Temp (!) 97.5 F (36.4 C) (Oral)   Ht 6' (1.829 m)   Wt 195 lb 12.8 oz (88.8 kg)   SpO2 96%   BMI  26.56 kg/m  Gen: NAD, resting comfortably CV: RRR no murmurs rubs or gallops Lungs: CTAB no crackles, wheeze, rhonchi Ext: no edema Skin: warm, dry, good capillary refill right index finger Neuro: intact distal sensation on right index finger MSK: Edematous right index finger around DIP. On radial side of finger- patient is able to express small amounts of white material. Surrounding tissue mildly erythematous but not particularly painful to palpation- only very mild. With flexion of finger- some pain at DIP joint and not as strong as on left hand.   Dg Finger Index Right  Result Date: 06/05/2017 CLINICAL DATA:  Small avulsion fracture recently. Evaluate for joint erosions. EXAM: RIGHT INDEX FINGER 2+V COMPARISON:  Right index finger x-rays dated  May 11, 2017. FINDINGS: No acute fracture or malalignment. No bony erosions or periostitis. Mild degenerative changes of the DIP joint with surrounding soft tissue swelling. Bone mineralization is normal. IMPRESSION: 1. Mild degenerative changes of the index finger DIP joint with surrounding soft tissue swelling. No fracture, erosions, or cortical destruction. Electronically Signed   By: Titus Dubin M.D.   On: 06/05/2017 15:10   Dg Finger Index Right  Result Date: 05/11/2017 CLINICAL DATA:  76 year old male with right index finger discoloration nail bed for the past 2 weeks. Injured EXAM: RIGHT INDEX FINGER 2+V COMPARISON:  None. FINDINGS: Tiny bony projection may represent osteophyte versus small dorsal avulsion fracture of the right second finger distal phalanx proximal aspect with adjacent soft tissue swelling. No other fracture noted. IMPRESSION: Tiny bony projection may represent osteophyte versus small dorsal avulsion fracture of the right second finger distal phalanx proximal aspect. Adjacent soft tissue swelling. Electronically Signed   By: Genia Del M.D.   On: 05/11/2017 13:48     Assessment/Plan:  Finger pain, right - Plan: Ambulatory referral to Sports Medicine, DG Finger Index Right, Synovial fluid, crystal  Gout with tophi S:  Patient was seen 05/11/17 at our office after smashing his finger in recliner chair- right index finger 2 weeks prior. Had worsening pain before time of visit. Had good capillary refill and sensation and was neurovascularly intact. Had resolving hematoma at that time. Also thought potential mild gout flare. No nsaids with CKD. Tylenol prn advised. On x-ray that day "Tiny bony projection may represent osteophyte versus small dorsal avulsion fracture of the right second finger distal phalanx proximal aspect. Adjacent soft tissue swelling."  He bought a splint to wear.   He states pain has improved since that time but has continued to have redness at  the finger, last night he noted some white material come out from his skin and he became very concerned about infection and decided to present today. No significant pain today A/P: Given patient is a diabetic (though well controlled), I was very concerned about potential infection though minimal pain and no systemic symptoms pointed away from this- I had Dr. 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 Paulla Fore concurs with this diagnosis. We attempted to get crystal analysis of the material that is expressed but did not get adequate amount. Updated x-ray as above- also points away from infection within joint given no erosions of joint space despite 3 weeks of this going on.   We will place patient on colchicine daily for next few weeks and have follow up with Dr. Paulla Fore. Also likely will need to get uric acid and consider uric acid lowering agent like allopurinol either at visit with Dr. Paulla Fore or  in follow up with me. This has not been on top of priority list given infrequent gout flares in past. Does slightly increase myalgia risk given his colchicine- will have Roselyn Reef reach out to inform him of this (see routing note- also to check in about potential hives)  Given diabetic- will cover for infection with keflex. We discussed risk of allergy overlap with PCN (hives). Considered doxycycline but patient also had allergy to this with hives.    Patient Instructions  Stop by x-ray before you go  Start colchicine daily.   Schedule visit with Dr. Paulla Fore in 2 weeks. See [place] back sooner if worsening symptoms  With the mild redness on the finger- cover with antibiotic for 5 days twice a day. You have an allergy to the cousin of this medicine- if you have hives stop medicine and take benadryl or zyrtec. If you have trouble breathing, lip or tongue swelling seek care immediately (doubt this will happen)  Future Appointments  Date Time Provider [place]  06/19/2017  10:40 AM Gerda Diss, DO [facility]    Orders Placed This Encounter  Procedures  . DG Finger Index Right    Standing Status:   Future    Number of Occurrences:   1    Standing Expiration Date:   08/06/2018    Order Specific Question:   Reason for Exam (SYMPTOM  OR DIAGNOSIS REQUIRED)    Answer:   small avulsion fracture a month ago. Joint now draining likely tophi likely- doubt infection. want to rule out erosions of joint.    Order Specific Question:   Preferred imaging location?    Answer:   [facility]    Order Specific Question:   Radiology Contrast Protocol - do NOT remove file path    Answer:   [email or web address]  . Synovial fluid, crystal    [facility]  . TIQ-NTM  . No Specimen Received  . Ambulatory referral to Sports Medicine    Referral Priority:   Routine    Referral Type:   Consultation    Referred to Provider:   Gerda Diss, DO    Number of Visits Requested:   1    Meds ordered this encounter  Medications  . colchicine (COLCRYS) 0.6 MG tablet    Sig: Take 1 tablet (0.6 mg total) by mouth daily.    Dispense:  30 tablet    Refill:  2  . cephALEXin (KEFLEX) 500 MG capsule    Sig: Take 1 capsule (500 mg total) by mouth 2 (two) times daily for 5 days.    Dispense:  10 capsule    Refill:  0    Return precautions advised.  Garret Reddish, MD

## 2017-06-05 NOTE — Patient Instructions (Addendum)
Stop by x-ray before you go  Start colchicine daily.   Schedule visit with Dr. Paulla Fore in 2 weeks. See [place] back sooner if worsening symptoms  With the mild redness on the finger- cover with antibiotic for 5 days twice a day. You have an allergy to the cousin of this medicine- if you have hives stop medicine and take benadryl or zyrtec. If you have trouble breathing, lip or tongue swelling seek care immediately (doubt this will happen)

## 2017-06-06 NOTE — Assessment & Plan Note (Signed)
See note 06/06/17

## 2017-06-09 ENCOUNTER — Telehealth: Payer: Self-pay | Admitting: Family Medicine

## 2017-06-09 NOTE — Telephone Encounter (Signed)
Copied from [place]. Topic: Quick Communication - See Telephone Encounter >> Jun 09, 2017 11:04 AM Vernona Rieger wrote: CRM for notification. See Telephone encounter for:   06/09/17. Lockie Pares called from [facility] and stated that the speciman came over to them empty. She said the patient needs to recollect. Her call back is [phone]. Please Advise.

## 2017-06-09 NOTE — Telephone Encounter (Signed)
Please see note below and advise as necessary.

## 2017-06-09 NOTE — Telephone Encounter (Signed)
Please see note below and advise  

## 2017-06-09 NOTE — Telephone Encounter (Signed)
Copied from [place]. Topic: Quick Communication - See Telephone Encounter >> Jun 09, 2017 11:08 AM Vernona Rieger wrote: CRM for notification. See Telephone encounter for:   06/09/17. Lockie Pares called from [facility] and stated that the speciman came over to them empty. She said the patient needs to recollect. Her call back is [phone]. Please Advise.

## 2017-06-10 LAB — SYNOVIAL FLUID, CRYSTAL

## 2017-06-10 LAB — NO SPECIMEN RECEIVED: Tests (Ordered): 4563

## 2017-06-10 LAB — TIQ-NTM

## 2017-06-10 NOTE — Telephone Encounter (Signed)
Called and spoke with patient who verbalized understanding.  

## 2017-06-10 NOTE — Telephone Encounter (Signed)
-----   Message from Marin Olp, MD sent at 06/06/2017 11:02 AM EST ----- Please make sure patient is not having hives with keflex.   Also please warn him that he may have some muscle aches on colchicine in combo with his statin. He can cut his statin medicinein half if this happens  Please also inform him may put him on medicine in near future to help lower uric acid.

## 2017-06-11 ENCOUNTER — Telehealth: Payer: Self-pay | Admitting: Family Medicine

## 2017-06-11 NOTE — Telephone Encounter (Signed)
Copied from [place]. Topic: Quick Communication - Rx Refill/Question >> Jun 11, 2017 11:47 AM Scherrie Gerlach wrote: Has the patient contacted their pharmacy? No Tried to explain to pt to call the pharmacy, but he kept insisting no more refills.  So advised pt I would request this time, but in the future please call the pharmacy Pt requesting NOVOLIN 70/30 RELION (70-30) 100 UNIT/ML injection [place], [place] [facility] [phone] (Phone) [phone] (Fax)       Preferred Pharmacy (with phone number or street name): ***   Agent: Please be advised that RX refills may take up to 3 business days. We ask that you follow-up with your pharmacy.

## 2017-06-11 NOTE — Telephone Encounter (Signed)
Unless we can get a large volume doubt they will be able to use the specimen. I would have him follow up with Dr. Paulla Fore as planned

## 2017-06-12 MED ORDER — INSULIN NPH ISOPHANE & REGULAR (70-30) 100 UNIT/ML ~~LOC~~ SUSP: SUBCUTANEOUS | 3 refills | Status: DC

## 2017-06-19 ENCOUNTER — Encounter: Payer: Self-pay | Admitting: Sports Medicine

## 2017-06-19 ENCOUNTER — Ambulatory Visit: Payer: Self-pay

## 2017-06-19 ENCOUNTER — Ambulatory Visit (INDEPENDENT_AMBULATORY_CARE_PROVIDER_SITE_OTHER): Payer: Medicare Other | Admitting: Sports Medicine

## 2017-06-19 VITALS — BP 112/66 | HR 60 | Ht 72.0 in | Wt 194.4 lb

## 2017-06-19 DIAGNOSIS — E119 Type 2 diabetes mellitus without complications: Secondary | ICD-10-CM | POA: Diagnosis not present

## 2017-06-19 DIAGNOSIS — M1A09X0 Idiopathic chronic gout, multiple sites, without tophus (tophi): Secondary | ICD-10-CM

## 2017-06-19 DIAGNOSIS — M1A9XX1 Chronic gout, unspecified, with tophus (tophi): Secondary | ICD-10-CM

## 2017-06-19 DIAGNOSIS — M79644 Pain in right finger(s): Secondary | ICD-10-CM

## 2017-06-19 DIAGNOSIS — Z794 Long term (current) use of insulin: Secondary | ICD-10-CM

## 2017-06-19 DIAGNOSIS — N183 Chronic kidney disease, stage 3 unspecified: Secondary | ICD-10-CM

## 2017-06-19 DIAGNOSIS — I2510 Atherosclerotic heart disease of native coronary artery without angina pectoris: Secondary | ICD-10-CM | POA: Diagnosis not present

## 2017-06-19 LAB — BASIC METABOLIC PANEL
BUN: 26 mg/dL — AB (ref 6–23)
CALCIUM: 9.5 mg/dL (ref 8.4–10.5)
CO2: 27 mEq/L (ref 19–32)
CREATININE: 1.66 mg/dL — AB (ref 0.40–1.50)
Chloride: 105 mEq/L (ref 96–112)
GFR: 52.23 mL/min — AB (ref >60.00)
GLUCOSE: 145 mg/dL — AB (ref 70–99)
POTASSIUM: 4.8 meq/L (ref 3.5–5.1)
Sodium: 140 mEq/L (ref 135–145)

## 2017-06-19 LAB — URIC ACID: Uric Acid, Serum: 8.9 mg/dL — ABNORMAL HIGH (ref 4.0–7.8)

## 2017-06-19 MED ORDER — DICLOFENAC SODIUM 1 % TD GEL: TRANSDERMAL | 2 refills | Status: DC

## 2017-06-19 NOTE — Progress Notes (Signed)
Matthew [name]. Matthew [name], Matthew [name] at [place] - 76 y.o. male MRN [id number]  Date of birth: Oct 05, 1940  Visit Date: 06/19/2017  PCP: Marin Olp, MD   Referred by: Marin Olp, MD   Scribe for today's visit: Josepha Pigg, CMA    SUBJECTIVE:  Matthew [name] is here for New Patient (Initial Visit) (RT finger pain) .  Referred by: Dr. Garret Reddish His RT finger pain symptoms INITIALLY: Began in 04/2017 when his finger got caught in a chair recliner. He is RT hand dominant.  Described as moderate aching and throbbing like a tooth ache. Periodically he will feel something like an "electrical shock" in the finger.  nonradiating Worsened with bending the finger, he has decreased ROM.  Improved with keeping finger straight and in finger splint.  Additional associated symptoms include: Pt known to have gout.     At this time symptoms are improving compared to onset  He had been prescribed Colchicine to take daily and was wearing a finger splint. He was also but on an antibiotic.    ROS Denies night time disturbances. Denies fevers, chills, or night sweats. Denies unexplained weight loss. Denies personal history of cancer. Denies changes in bowel or bladder habits. Denies recent unreported falls. Denies new or worsening dyspnea or wheezing. Denies headaches or dizziness.  Denies numbness, tingling or weakness  In the extremities.  Denies dizziness or presyncopal episodes Reports lower extremity edema      OBJECTIVE:  VS:  HT:6' (182.9 cm)   WT:194 lb 6.4 oz (88.2 kg)  BMI:26.36    BP:112/66  HR:60bpm  TEMP: ( )  RESP:    HISTORY & PERTINENT PRIOR DATA:  Prior History reviewed and updated per electronic medical record.  Significant history, findings, studies and interim changes include:  reports that he quit smoking about 49 years ago. His smoking use included cigarettes. He quit  smokeless tobacco use about 28 years ago. His smokeless tobacco use included chew. Recent Labs    08/12/16 1131 11/18/16 1110 03/19/17 0832 06/19/17 1115  HGBA1C 8.0* 7.7* 6.8  --   LABURIC  --   --   --  8.9*   No specialty comments available. No problems updated.   OBJECTIVE:  VS:  HT:6' (182.9 cm)   WT:194 lb 6.4 oz (88.2 kg)  BMI:26.36    BP:112/66  HR:60bpm  TEMP: ( )  RESP:    PHYSICAL EXAM: Constitutional: WDWN, Non-toxic appearing. Psychiatric: Alert & appropriately interactive. Not depressed or anxious appearing. Respiratory: No increased work of breathing. Trachea Midline Eyes: Pupils are equal. EOM intact without nystagmus. No scleral icterus Cardiovascular:  Peripheral Pulses: peripheral pulses symmetrical No clubbing or cyanosis appreciated Capillary Refill is normal, less than 2 seconds No signficant generalized edema/anasarca Sensory Exam: intact to light touch   Right finger: Swollen DIP with no significant erythema or malformation.  He does have a small nailbed laceration that has been healing well.  He has active flexion and extension at the MCP, PIP and DIP.  There is a small amount of pain with compression of the nailbed but this is mild.  All  No additional findings.   ASSESSMENT & PLAN:   1. Finger pain, right   2. Gout with tophi   3. Controlled type 2 diabetes mellitus without complication, with long-term current use of insulin ([place])   4. Idiopathic chronic gout of multiple sites without tophus  5. CKD (chronic kidney disease), stage III (HCC)    PLAN: Significant changes consistent with a contusion/crush injury in the setting of underlying severe OA/gout.  Given his chronic medical conditions and continued improvement further conservative measures with topical anti-inflammatories and soaking recommended at this time.  We discussed injection he would like to hold off on this.  We will check in with him  Additionally we will plan to check uric  acid levels today given he is unsure as to when the last time it was this was evaluated  No problem-specific Assessment & Plan notes found for this encounter.   ++++++++++++++++++++++++++++++++++++++++++++ Orders & Meds: Orders Placed This Encounter  Procedures  . [place] LIMITED JOINT SPACE STRUCTURES UP RIGHT(NO LINKED CHARGES)  . Uric acid  . Basic metabolic panel    Meds ordered this encounter  Medications  . diclofenac sodium (VOLTAREN) 1 % GEL    Sig: Apply topically to affected area qid    Dispense:  100 g    Refill:  2  . allopurinol (ZYLOPRIM) 300 MG tablet    Sig: Take 1 tablet (300 mg total) by mouth daily.    Dispense:  30 tablet    Refill:  6    ++++++++++++++++++++++++++++++++++++++++++++ Follow-up: Return in about 4 weeks (around 07/17/2017).   Pertinent documentation may be included in additional procedure notes, imaging studies, problem based documentation and patient instructions. Please see these sections of the encounter for additional information regarding this visit. CMA/ATC served as Education administrator during this visit. History, Physical, and Plan performed by medical provider. Documentation and orders reviewed and attested to.      Gerda Diss, [facility] Physician

## 2017-06-22 MED ORDER — ALLOPURINOL 300 MG PO TABS: 300.0000 mg | ORAL_TABLET | Freq: Every day | ORAL | 6 refills | Status: DC

## 2017-06-22 NOTE — Progress Notes (Signed)
His uric acid level is markedly high and he should be started on allopurinol.  I have sent in a prescription for him to his pharmacy.  He should start this on a daily basis and we should recheck his blood work in approximately 6 weeks.  Please call and inform him of these results.

## 2017-06-25 ENCOUNTER — Telehealth: Payer: Self-pay

## 2017-06-25 NOTE — Telephone Encounter (Signed)
Call to Matthew [name] and stated he would schedule later in the year.  Does not want to schedule AWV now [place]

## 2017-06-29 NOTE — Procedures (Signed)
++++++++++++++++++++++++++++++++++++++++++++++++++++++++++++++++++   LIMITED MSK ULTRASOUND OF right second finger Images were obtained and interpreted by myself, Teresa Coombs, DO  Images have been saved and stored to PACS system. Images obtained on: GE S7 Ultrasound machine  FINDINGS:   Significant degenerative changes at the DIP with slight starry night appearance of the joint fluid consistent with crystal arthropathy.  Marked narrowing of the joint space consistent with severe end-stage OA  Soft tissue edema consistent with contusion  IMPRESSION:  1. Severe DIP injury with underlying tophaceous changes and end-stage OA.  Only a small effusion today.  No generalized synovitis with some overlying soft tissue injury consistent with contusion.

## 2017-07-17 ENCOUNTER — Ambulatory Visit: Payer: Medicare Other | Admitting: Sports Medicine

## 2017-07-29 ENCOUNTER — Other Ambulatory Visit: Payer: Self-pay | Admitting: Family Medicine

## 2017-09-10 ENCOUNTER — Other Ambulatory Visit: Payer: Self-pay | Admitting: Family Medicine

## 2017-09-18 ENCOUNTER — Telehealth: Payer: Self-pay | Admitting: Family Medicine

## 2017-09-18 NOTE — Telephone Encounter (Signed)
Pt declined AWV. °

## 2017-11-04 ENCOUNTER — Other Ambulatory Visit: Payer: Self-pay | Admitting: Family Medicine

## 2017-12-30 DIAGNOSIS — H25813 Combined forms of age-related cataract, bilateral: Secondary | ICD-10-CM | POA: Diagnosis not present

## 2017-12-30 DIAGNOSIS — E119 Type 2 diabetes mellitus without complications: Secondary | ICD-10-CM | POA: Diagnosis not present

## 2018-01-19 ENCOUNTER — Telehealth: Payer: Self-pay

## 2018-01-19 DIAGNOSIS — H539 Unspecified visual disturbance: Secondary | ICD-10-CM

## 2018-01-19 DIAGNOSIS — I2510 Atherosclerotic heart disease of native coronary artery without angina pectoris: Secondary | ICD-10-CM

## 2018-01-19 NOTE — Telephone Encounter (Signed)
-----   Message from Sherren Mocha, MD sent at 01/19/2018 10:17 AM EDT ----- Sounds good. Will arrange echo/carotid studies and cc Dr Katy Fitch with results - thx ----- Message ----- From: Marin Olp, MD Sent: 01/18/2018   8:06 PM To: Sherren Mocha, MD, Lillette Boxer Self, #  Hey team,   Please convert this to phone note. I received a letter from Dr. Katy Fitch addressed to Dr. Burt Knack (I am ccing him in case he hasnt received letter yet) but that was also forwarded to me. It states patient has episodes of right monocular vision loss in PM < 10 minutes. - carotid ultrasound and echocardiogram recommended.   Neither I nor Dr. Burt Knack have seen him recently. Team- please find a time in next few weeks to get patient in for a visit with me. Ill plan on ordering carotid duplex at that time as well as 2-d echo (unless Dr. Burt Knack wants me to order something differently).   Matthew [name]

## 2018-01-19 NOTE — Telephone Encounter (Signed)
Carotids have been scheduled 7/25 as well.

## 2018-01-19 NOTE — Telephone Encounter (Signed)
Called patient and scheduled echocardiogram 7/25. Scheduled 1 year OV with Dr. Burt Knack 11/14.   Carotid US ordered for scheduling. The patient understands scheduling will call to arrange carotids. He also understands he will come to the office for evaluation earlier if tests are abnormal. He was grateful for call.   Will send to Dr. Katy Fitch once completed.

## 2018-01-20 ENCOUNTER — Telehealth: Payer: Self-pay

## 2018-01-20 NOTE — Telephone Encounter (Signed)
Hate to send another note but cardiology said they would address this by setting up both imaging tests-  I think you were in those threads. He doesn't need to see [place]- please call him to cancel appointment

## 2018-01-20 NOTE — Telephone Encounter (Signed)
Patient is scheduled for Friday, January 22, 2018 at 4:15

## 2018-01-20 NOTE — Telephone Encounter (Signed)
-----   Message from Marin Olp, MD sent at 01/18/2018  8:06 PM EDT ----- [facility] team,   Please convert this to phone note. I received a letter from Dr. Katy Fitch addressed to Dr. Burt Knack (I am ccing him in case he hasnt received letter yet) but that was also forwarded to me. It states patient has episodes of right monocular vision loss in PM < 10 minutes. - carotid ultrasound and echocardiogram recommended.   Neither I nor Dr. Burt Knack have seen him recently. Team- please find a time in next few weeks to get patient in for a visit with me. Ill plan on ordering carotid duplex at that time as well as 2-d echo (unless Dr. Burt Knack wants me to order something differently).   Garret Reddish

## 2018-01-20 NOTE — Telephone Encounter (Signed)
Called patient and cancelled appointment.

## 2018-01-21 ENCOUNTER — Ambulatory Visit (HOSPITAL_BASED_OUTPATIENT_CLINIC_OR_DEPARTMENT_OTHER): Payer: Medicare Other

## 2018-01-21 ENCOUNTER — Other Ambulatory Visit: Payer: Self-pay

## 2018-01-21 ENCOUNTER — Ambulatory Visit (HOSPITAL_COMMUNITY)
Admission: RE | Admit: 2018-01-21 | Discharge: 2018-01-21 | Disposition: A | Discharge status: Home / Self Care | Payer: Medicare Other | Source: Ambulatory Visit | Attending: Cardiology | Admitting: Cardiology

## 2018-01-21 DIAGNOSIS — I2510 Atherosclerotic heart disease of native coronary artery without angina pectoris: Secondary | ICD-10-CM | POA: Insufficient documentation

## 2018-01-21 DIAGNOSIS — H539 Unspecified visual disturbance: Secondary | ICD-10-CM | POA: Diagnosis not present

## 2018-01-22 ENCOUNTER — Ambulatory Visit: Payer: Medicare Other | Admitting: Family Medicine

## 2018-01-28 ENCOUNTER — Other Ambulatory Visit: Payer: Self-pay

## 2018-02-03 ENCOUNTER — Other Ambulatory Visit: Payer: Self-pay | Admitting: Family Medicine

## 2018-03-05 ENCOUNTER — Telehealth: Payer: Self-pay

## 2018-03-05 NOTE — Telephone Encounter (Signed)
Rescheduled patient to 11/4. He was grateful for call and agrees with treatment plan.

## 2018-03-05 NOTE — Telephone Encounter (Signed)
Called to reschedule 11/14 OV (his current appointment was scheduled during CV meeting).  VM is full - unable to leave message. Will try again later.

## 2018-03-19 ENCOUNTER — Other Ambulatory Visit: Payer: Self-pay | Admitting: Family Medicine

## 2018-03-25 DIAGNOSIS — M109 Gout, unspecified: Secondary | ICD-10-CM | POA: Diagnosis not present

## 2018-03-25 DIAGNOSIS — E8770 Fluid overload, unspecified: Secondary | ICD-10-CM | POA: Diagnosis not present

## 2018-03-25 DIAGNOSIS — M47812 Spondylosis without myelopathy or radiculopathy, cervical region: Secondary | ICD-10-CM | POA: Diagnosis not present

## 2018-03-25 DIAGNOSIS — E1122 Type 2 diabetes mellitus with diabetic chronic kidney disease: Secondary | ICD-10-CM | POA: Diagnosis not present

## 2018-03-25 DIAGNOSIS — N183 Chronic kidney disease, stage 3 (moderate): Secondary | ICD-10-CM | POA: Diagnosis not present

## 2018-03-25 DIAGNOSIS — I129 Hypertensive chronic kidney disease with stage 1 through stage 4 chronic kidney disease, or unspecified chronic kidney disease: Secondary | ICD-10-CM | POA: Diagnosis not present

## 2018-03-25 DIAGNOSIS — E669 Obesity, unspecified: Secondary | ICD-10-CM | POA: Diagnosis not present

## 2018-04-07 DIAGNOSIS — Z23 Encounter for immunization: Secondary | ICD-10-CM | POA: Diagnosis not present

## 2018-04-19 DIAGNOSIS — I739 Peripheral vascular disease, unspecified: Secondary | ICD-10-CM | POA: Diagnosis not present

## 2018-05-03 ENCOUNTER — Encounter: Payer: Self-pay | Admitting: Cardiovascular Disease

## 2018-05-03 ENCOUNTER — Ambulatory Visit (INDEPENDENT_AMBULATORY_CARE_PROVIDER_SITE_OTHER): Payer: Medicare Other | Admitting: Cardiovascular Disease

## 2018-05-03 VITALS — BP 134/70 | HR 49 | Ht 72.0 in | Wt 199.0 lb

## 2018-05-03 DIAGNOSIS — I10 Essential (primary) hypertension: Secondary | ICD-10-CM

## 2018-05-03 DIAGNOSIS — E118 Type 2 diabetes mellitus with unspecified complications: Secondary | ICD-10-CM

## 2018-05-03 DIAGNOSIS — I2510 Atherosclerotic heart disease of native coronary artery without angina pectoris: Secondary | ICD-10-CM | POA: Diagnosis not present

## 2018-05-03 DIAGNOSIS — Z794 Long term (current) use of insulin: Secondary | ICD-10-CM | POA: Diagnosis not present

## 2018-05-03 DIAGNOSIS — E782 Mixed hyperlipidemia: Secondary | ICD-10-CM | POA: Diagnosis not present

## 2018-05-03 NOTE — Patient Instructions (Signed)
Medication Instructions:  Your provider recommends that you continue on your current medications as directed. Please refer to the Current Medication list given to you today.    Labwork: None  Testing/Procedures: None  Follow-Up: Your provider wants you to follow-up in: 1 year with Dr. Cooper or his assistant. You will receive a reminder letter in the mail two months in advance. If you don't receive a letter, please call our office to schedule the follow-up appointment.    Any Other Special Instructions Will Be Listed Below (If Applicable).     If you need a refill on your cardiac medications before your next appointment, please call your pharmacy.   

## 2018-05-03 NOTE — Progress Notes (Signed)
Cardiology Office Note:    Date:  05/03/2018   ID:  Matthew [name], DOB 12/13/1940, MRN [id number]  PCP:  Marin Olp, MD  Cardiologist:  Sherren Mocha, MD  Electrophysiologist:  None   Referring MD: Marin Olp, MD   Chief Complaint  Patient presents with  . Follow-up    CAD   History of Present Illness:    Matthew [name] is a 77 y.o. male with a hx of coronary artery disease, presented for follow-up evaluation.  The patient underwent remote CABG in 1997.  Also has peripheral arterial disease with bilateral SFA occlusion managed medically.  His last nuclear stress test in 2013 showed normal perfusion with an LVEF of 60%.  The patient is here alone today.  He has been feeling well.  He specifically denies chest pain, chest pressure, shortness of breath, heart palpitations, orthopnea, or PND.  He admits to mild leg swelling which is a chronic issue.  States that his blood glucose control has not been quite as good as it was 1 year ago when his hemoglobin A1c was 6.8.  He is active with work in his yard and has no symptoms with that level of exertion.  He participates in an exercise program through a research study at [facility]  Past Medical History:  Diagnosis Date  . CLAUDICATION 05/14/2007  . CORONARY ARTERY DISEASE 12/28/2006  . Diabetes mellitus type II, controlled ([place]) 12/28/2006   Actos 30mg , insulin 70/30 20 units BID, amaryl 1mg  previously a1c <8. Worsened due to cold over 5 weeks around 05/2014 eating poorly and eating sugary syrups.   Stomach irritation on metformin.  Lab Results  Component Value Date   HGBA1C 8.7* 06/05/2014       . DIABETES MELLITUS, TYPE II 12/28/2006  . Duodenal ulcer   . Erosive gastritis   . GERD (gastroesophageal reflux disease)   . GOUT 12/28/2006  . Hemorrhoids   . Hiatal hernia   . HYPERLIPIDEMIA 12/28/2006  . HYPERTENSION 12/28/2006  . LATERAL EPICONDYLITIS, RIGHT 12/11/2009  . Raynaud's disease   . RENAL FAILURE, CHRONIC  09/15/2008    Past Surgical History:  Procedure Laterality Date  . COLONOSCOPY    . CORONARY ARTERY BYPASS GRAFT    . KNEE ARTHROSCOPY  2012    Current Medications: Current Meds  Medication Sig  . allopurinol (ZYLOPRIM) 300 MG tablet Take 1 tablet (300 mg total) by mouth daily.  Marland Kitchen aspirin 81 MG tablet Take 81 mg by mouth daily.    Marland Kitchen atorvastatin (LIPITOR) 40 MG tablet TAKE ONE TABLET BY MOUTH ONCE DAILY  . azelastine (ASTELIN) 0.1 % nasal spray Place 1 spray into both nostrils 2 (two) times daily. Use in each nostril as directed  . chlorthalidone (HYGROTON) 25 MG tablet TAKE 1 TABLET BY MOUTH ONCE DAILY  . colchicine (COLCRYS) 0.6 MG tablet Take 1 tablet (0.6 mg total) by mouth daily.  . cyclobenzaprine (FLEXERIL) 5 MG tablet TAKE ONE TABLET BY MOUTH TWICE DAILY AS NEEDED FOR MUSCLE SPASM  . diclofenac sodium (VOLTAREN) 1 % GEL Apply topically to affected area qid  . fexofenadine (ALLEGRA) 180 MG tablet Take 180 mg by mouth daily.  . fluticasone (FLONASE) 50 MCG/ACT nasal spray USE 2 SPRAYS IN EACH NOSTRIL DAILY AS NEEDED FOR ALLERGIES OR RHINITIS (NASAL CONGESTION)  . glimepiride (AMARYL) 1 MG tablet TAKE 1 TABLET BY MOUTH ONCE DAILY BEFORE  BREAKFAST  . glucose 4 GM chewable tablet Chew 1 tablet by mouth once as  needed for low blood sugar. Reported on 12/26/2015  . lisinopril (PRINIVIL,ZESTRIL) 40 MG tablet TAKE ONE TABLET BY MOUTH ONCE DAILY  . metoprolol succinate (TOPROL-XL) 100 MG 24 hr tablet TAKE 1 TABLET EVERY DAY  . NIFEdipine (PROCARDIA-XL) 30 MG (OSM) 24 hr tablet Take 30 mg by mouth 2 (two) times daily.    Marland Kitchen NOVOLIN 70/30 RELION (70-30) 100 UNIT/ML injection INJECT 22 UNITS SUBCUTANEOUSLY IN THE MORNING AND 20 IN THE EVENING  . Omega-3 Fatty Acids (FISH OIL PO) Take 1,400 mg by mouth.  . pioglitazone (ACTOS) 30 MG tablet TAKE ONE TABLET BY MOUTH ONCE DAILY     Allergies:   Penicillins and Tetracycline hcl   Social History   Socioeconomic History  . Marital status:  Married    Spouse name: Not on file  . Number of children: Not on file  . Years of education: Not on file  . Highest education level: Not on file  Occupational History  . Occupation: retired  Scientific laboratory technician  . Financial resource strain: Not on file  . Food insecurity:    Worry: Not on file    Inability: Not on file  . Transportation needs:    Medical: Not on file    Non-medical: Not on file  Tobacco Use  . Smoking status: Former Smoker    Types: Cigarettes    Last attempt to quit: 07/09/1968    Years since quitting: 49.8  . Smokeless tobacco: Former Systems developer    Types: Chew    Quit date: 07/09/1988  . Tobacco comment: minimal use x 10 years   Substance and Sexual Activity  . Alcohol use: No    Comment: last was 6 months  . Drug use: No  . Sexual activity: Not on file  Lifestyle  . Physical activity:    Days per week: Not on file    Minutes per session: Not on file  . Stress: Not on file  Relationships  . Social connections:    Talks on phone: Not on file    Gets together: Not on file    Attends religious service: Not on file    Active member of club or organization: Not on file    Attends meetings of clubs or organizations: Not on file    Relationship status: Not on file  Other Topics Concern  . Not on file  Social History Narrative   Married 1983. 2 sons. No grandkids yet.       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       Hobbies: volunteer work, watch sports     Family History: The patient's family history includes Diabetes in his mother; Hypertension in his mother; Stroke in his father. There is no history of Colon cancer.  ROS:   Please see the history of present illness.    Positive for headaches and chills.  All other systems reviewed and are negative.  EKGs/Labs/Other Studies Reviewed:    EKG:  EKG is ordered today.  The ekg ordered today demonstrates sinus bradycardia 49 bpm, first-degree  AV block, otherwise within normal limits.  Recent Labs: 06/19/2017: BUN 26; Creatinine, Ser 1.66; Potassium 4.8; Sodium 140  Recent Lipid Panel    Component Value Date/Time   CHOL 115 11/18/2016 1110   TRIG 72.0 11/18/2016 1110   HDL 34.70 (L) 11/18/2016 1110   CHOLHDL 3 11/18/2016 1110   VLDL 14.4 11/18/2016 1110   LDLCALC 66 11/18/2016 1110  LDLDIRECT 79.0 07/19/2015 1107    Physical Exam:    VS:  BP 134/70   Pulse (!) 49   Ht 6' (1.829 m)   Wt 199 lb (90.3 kg)   SpO2 98%   BMI 26.99 kg/m     Wt Readings from Last 3 Encounters:  05/03/18 199 lb (90.3 kg)  06/19/17 194 lb 6.4 oz (88.2 kg)  06/05/17 195 lb 12.8 oz (88.8 kg)     GEN:  Well nourished, well developed in no acute distress HEENT: Normal NECK: No JVD; No carotid bruits LYMPHATICS: No lymphadenopathy CARDIAC: RRR, no murmurs, rubs, gallops RESPIRATORY:  Clear to auscultation without rales, wheezing or rhonchi  ABDOMEN: Soft, non-tender, non-distended MUSCULOSKELETAL:  Trace bilateral pretibial edema bilaterally; No deformity  SKIN: Warm and dry NEUROLOGIC:  Alert and oriented x 3 PSYCHIATRIC:  Normal affect   ASSESSMENT:    1. Coronary artery disease involving native coronary artery of native heart without angina pectoris   2. Mixed hyperlipidemia   3. Essential hypertension   4. Type 2 diabetes mellitus with complication, with long-term current use of insulin (HCC)    PLAN:    In order of problems listed above:  1. The patient appears stable without symptoms of angina.  He remains on antiplatelet therapy with aspirin, high intensity statin drug, and ACE inhibitor in the setting of diabetes, and a beta-blocker. 2. Last lipids reviewed with an LDL cholesterol of 66 mg/dL on atorvastatin 40 mg daily 3. Blood pressure controlled on a combination of chlorthalidone, lisinopril, metoprolol succinate, and extended release nifedipine. 4. Managed by his primary physician.  Treated with insulin and oral  hypoglycemics.  Last hemoglobin A1c 6.8, improved from previous values. His annual physical is cheduled for January and he will have labs drawn at that time.   Medication Adjustments/Labs and Tests Ordered: Current medicines are reviewed at length with the patient today.  Concerns regarding medicines are outlined above.  No orders of the defined types were placed in this encounter.  No orders of the defined types were placed in this encounter.   There are no Patient Instructions on file for this visit.   Signed, Sherren Mocha, MD  05/03/2018 10:52 AM    [place]

## 2018-05-13 ENCOUNTER — Ambulatory Visit: Payer: Medicare Other | Admitting: Cardiovascular Disease

## 2018-05-14 ENCOUNTER — Other Ambulatory Visit: Payer: Self-pay

## 2018-05-31 ENCOUNTER — Ambulatory Visit (INDEPENDENT_AMBULATORY_CARE_PROVIDER_SITE_OTHER): Payer: Medicare Other | Admitting: Podiatry

## 2018-05-31 ENCOUNTER — Other Ambulatory Visit: Payer: Self-pay | Admitting: Podiatry

## 2018-05-31 ENCOUNTER — Ambulatory Visit (INDEPENDENT_AMBULATORY_CARE_PROVIDER_SITE_OTHER): Payer: Medicare Other

## 2018-05-31 ENCOUNTER — Encounter: Payer: Self-pay | Admitting: Podiatry

## 2018-05-31 VITALS — BP 135/66 | HR 53 | Resp 16

## 2018-05-31 DIAGNOSIS — M79671 Pain in right foot: Secondary | ICD-10-CM

## 2018-05-31 DIAGNOSIS — I999 Unspecified disorder of circulatory system: Secondary | ICD-10-CM

## 2018-05-31 DIAGNOSIS — M779 Enthesopathy, unspecified: Secondary | ICD-10-CM

## 2018-05-31 DIAGNOSIS — I2510 Atherosclerotic heart disease of native coronary artery without angina pectoris: Secondary | ICD-10-CM | POA: Diagnosis not present

## 2018-05-31 MED ORDER — TRIAMCINOLONE ACETONIDE 10 MG/ML IJ SUSP
10.0000 mg | Freq: Once | INTRAMUSCULAR | Status: AC
  Administered 2018-05-31: 10 mg

## 2018-05-31 NOTE — Progress Notes (Signed)
   Subjective:    Patient ID: Matthew [name], male    DOB: 03/13/1941, 77 y.o.   MRN: [id number]  HPI    Review of Systems  All other systems reviewed and are negative.      Objective:   Physical Exam        Assessment & Plan:

## 2018-06-06 NOTE — Progress Notes (Signed)
Subjective:   Patient ID: Matthew [name], male   DOB: 77 y.o.   MRN: [id number]   HPI Patient presents stating he has a lot of pain in the right arch and it makes it hard to walk.  It was stepped on when he was young it is been bothering him over a fairly extended period of time and is worsened over the last 6 months gradually making ambulation more difficult.  Patient does not smoke currently and does get pain in his legs if he does a lot of walking   Review of Systems  Eyes: Positive for blurred vision.  All other systems reviewed and are negative.       Objective:  Physical Exam  Constitutional: He appears well-developed and well-nourished.  Cardiovascular: Intact distal pulses.  Pulmonary/Chest: Effort normal.  Musculoskeletal: Normal range of motion.  Neurological: He is alert.  Skin: Skin is warm.  Nursing note and vitals reviewed.   Neurovascular status intact muscle strength is adequate range of motion within normal limits patient is noted to have exquisite discomfort in the right arch and the posterior tibial insertion right and does have diminishment of PT DP pulses when tested at this time.  Patient has diminished hair growth and does have some signs of vascular disease and claudication symptoms and has had history of heart issues with previous bypass procedures     Assessment:  Probability for some form of posterior tibial tendinitis right with also coronary artery disease and  probable vascular disease being a part of the symptoms he is experiencing     Plan:  Reviewed both conditions and x-rays and today I did do a careful injection of the posterior tib in the medial fascial band 3 mg Kenalog 5 mg Xylocaine and I then discussed the vascular status and I would like to get this him evaluated for this and we are referring him to vascular for evaluation.  Patient will be seen back to reevaluate  X-rays indicate moderate spur formation with depression of the arch no  indication of fracture noted

## 2018-06-07 ENCOUNTER — Ambulatory Visit (INDEPENDENT_AMBULATORY_CARE_PROVIDER_SITE_OTHER): Payer: Medicare Other | Admitting: Podiatry

## 2018-06-07 ENCOUNTER — Encounter: Payer: Self-pay | Admitting: Podiatry

## 2018-06-07 DIAGNOSIS — M779 Enthesopathy, unspecified: Secondary | ICD-10-CM | POA: Diagnosis not present

## 2018-06-07 DIAGNOSIS — I2510 Atherosclerotic heart disease of native coronary artery without angina pectoris: Secondary | ICD-10-CM | POA: Diagnosis not present

## 2018-06-07 DIAGNOSIS — I999 Unspecified disorder of circulatory system: Secondary | ICD-10-CM

## 2018-06-07 MED ORDER — TRIAMCINOLONE ACETONIDE 10 MG/ML IJ SUSP
10.0000 mg | Freq: Once | INTRAMUSCULAR | Status: AC
  Administered 2018-06-07: 10 mg

## 2018-06-08 DIAGNOSIS — R1084 Generalized abdominal pain: Secondary | ICD-10-CM | POA: Diagnosis not present

## 2018-06-09 NOTE — Progress Notes (Signed)
Subjective:   Patient ID: Matthew [name], male   DOB: 77 y.o.   MRN: [id number]   HPI Patient presents stating that the pain has reduced quite a bit but is still present and also states that he has had a circulation checked and is moderately reduced but they are going to monitor   ROS      Objective:  Physical Exam  Neurovascular status intact with posterior tibial pain which is improved but is still present to a mild nature with diminished pulses noted     Assessment:  Tenderness symptoms which are improving but still present with patient noted to have circulatory deficit     Plan:  H&P conditions reviewed recommended continuation of supportive shoes and patient will be seen back for [place] to recheck.  Continue monitoring vascular

## 2018-06-16 ENCOUNTER — Other Ambulatory Visit: Payer: Self-pay | Admitting: Family Medicine

## 2018-07-06 ENCOUNTER — Ambulatory Visit: Payer: Medicare Other | Admitting: Family Medicine

## 2018-07-14 ENCOUNTER — Ambulatory Visit (INDEPENDENT_AMBULATORY_CARE_PROVIDER_SITE_OTHER): Payer: Medicare Other | Admitting: Family Medicine

## 2018-07-14 ENCOUNTER — Encounter: Payer: Self-pay | Admitting: Family Medicine

## 2018-07-14 ENCOUNTER — Telehealth: Payer: Self-pay | Admitting: Family Medicine

## 2018-07-14 VITALS — BP 120/66 | HR 60 | Temp 97.6°F | Ht 72.0 in | Wt 199.2 lb

## 2018-07-14 DIAGNOSIS — N183 Chronic kidney disease, stage 3 unspecified: Secondary | ICD-10-CM

## 2018-07-14 DIAGNOSIS — E119 Type 2 diabetes mellitus without complications: Secondary | ICD-10-CM

## 2018-07-14 DIAGNOSIS — Z794 Long term (current) use of insulin: Secondary | ICD-10-CM | POA: Diagnosis not present

## 2018-07-14 DIAGNOSIS — E785 Hyperlipidemia, unspecified: Secondary | ICD-10-CM | POA: Diagnosis not present

## 2018-07-14 DIAGNOSIS — M1A9XX1 Chronic gout, unspecified, with tophus (tophi): Secondary | ICD-10-CM

## 2018-07-14 DIAGNOSIS — I10 Essential (primary) hypertension: Secondary | ICD-10-CM

## 2018-07-14 LAB — COMPREHENSIVE METABOLIC PANEL
ALT: 13 U/L (ref 0–53)
AST: 18 U/L (ref 0–37)
Albumin: 4.3 g/dL (ref 3.5–5.2)
Alkaline Phosphatase: 51 U/L (ref 39–117)
BILIRUBIN TOTAL: 0.5 mg/dL (ref 0.2–1.2)
BUN: 18 mg/dL (ref 6–23)
CO2: 31 mEq/L (ref 19–32)
CREATININE: 1.51 mg/dL — AB (ref 0.40–1.50)
Calcium: 9.9 mg/dL (ref 8.4–10.5)
Chloride: 103 mEq/L (ref 96–112)
GFR: 58.09 mL/min — ABNORMAL LOW (ref >60.00)
Glucose, Bld: 84 mg/dL (ref 70–99)
Potassium: 4.4 mEq/L (ref 3.5–5.1)
Sodium: 140 mEq/L (ref 135–145)
TOTAL PROTEIN: 8 g/dL (ref 6.0–8.3)

## 2018-07-14 LAB — POCT GLYCOSYLATED HEMOGLOBIN (HGB A1C): Hemoglobin A1C: 7.1 % — AB (ref 4.0–5.6)

## 2018-07-14 LAB — CBC
HCT: 45.3 % (ref 39.0–52.0)
Hemoglobin: 14.9 g/dL (ref 13.0–17.0)
MCHC: 32.9 g/dL (ref 30.0–36.0)
MCV: 89.6 fl (ref 78.0–100.0)
Platelets: 163 10*3/uL (ref 150.0–400.0)
RBC: 5.05 Mil/uL (ref 4.22–5.81)
RDW: 13.9 % (ref 11.5–15.5)
WBC: 6.8 10*3/uL (ref 4.0–10.5)

## 2018-07-14 LAB — URIC ACID: Uric Acid, Serum: 7.9 mg/dL — ABNORMAL HIGH (ref 4.0–7.8)

## 2018-07-14 LAB — LIPID PANEL
Cholesterol: 136 mg/dL (ref 0–200)
HDL: 52.6 mg/dL (ref >39.00)
LDL Cholesterol: 70 mg/dL (ref 0–99)
NonHDL: 83.32
Total CHOL/HDL Ratio: 3
Triglycerides: 66 mg/dL (ref 0.0–149.0)
VLDL: 13.2 mg/dL (ref 0.0–40.0)

## 2018-07-14 MED ORDER — ALLOPURINOL 300 MG PO TABS: 300.0000 mg | ORAL_TABLET | Freq: Every day | ORAL | 6 refills | Status: DC

## 2018-07-14 NOTE — Telephone Encounter (Signed)
See note  Copied from [place] [id number]. Topic: General - Call Back - No Documentation >> Jul 14, 2018 10:00 AM Sheran Luz wrote: Reason for CRM: Lilia Pro, with [facility], returning call to office regarding a request they have received about faxing over patients most recent diabetic eye exam. Lilia Pro states that patient has not been seen by their office.

## 2018-07-14 NOTE — Assessment & Plan Note (Signed)
S: Reasonably controlled on insulin 70/30-22 units in the morning 20 units in the evening-he does some mild adjustments at home intermittently.  Has not tolerated metformin in the past due to GI upset.  He is also on Actos 30 mg and Amaryl 1 mg-could stop the Amaryl  CBGs- lowest he has seen was 77 this morning Exercise and diet-weight up about 5 pounds from 2018 Lab Results  Component Value Date   HGBA1C 7.1 (A) 07/14/2018   HGBA1C 6.8 03/19/2017   HGBA1C 7.7 (H) 11/18/2016  A/P: I believe A1c goal of 7.5 or less is reasonable-continue current medication

## 2018-07-14 NOTE — Assessment & Plan Note (Signed)
S: controlled on chlorthalidone 25 mg, lisinopril 40 mg, Toprol 100 mg, nifedipine 30 mg extended release BP Readings from Last 3 Encounters:  07/14/18 120/66  05/31/18 135/66  05/03/18 134/70  A/P: blood pressure goal of <140/90. Continue current meds

## 2018-07-14 NOTE — Patient Instructions (Addendum)
Health Maintenance Due  Topic Date Due  . HEMOGLOBIN A1C -completed today 09/16/2017  . OPHTHALMOLOGY EXAM -called [facility] to ask that they fax report 12/30/2017  . TETANUS/TDAP -please go by your pharmacy and let us know when you receive this 03/03/2018    Please stop by lab before you go   We discussed colchicine does not lower overall uric acid levels like allopurinol and with kidney disease probably better to be on allopurinol. He is going to try it one more time but if gets itching issue again he is going to stop and stick with colchicine. uloric- not advisable as increases cardiovascular risk. Take colchicine and allopurinol for first 2 weeks. Come back for uric acid level in 2 months  - schedule a lab visit for this  4-6 months follow up for diabetes- please schedule before you leave

## 2018-07-14 NOTE — Telephone Encounter (Signed)
noted 

## 2018-07-14 NOTE — Assessment & Plan Note (Signed)
S: Patient with very elevated uric acid in 2018-we started him on allopurinol at that time.  He stopped taking the medication.   He has had 2 episodes of gout since last visit- used colchicine. He is now taking colchicine regularly.  Lab Results  Component Value Date   LABURIC 8.9 (H) 06/19/2017   A/P:  We discussed colchicine does not lower overall uric acid levels like allopurinol and with kidney disease probably better to be on allopurinol. He is going to try it one more time but if gets itching issue again he is going to stop and stick with colchicine. uloric- not advisable as increases cardiovascular risk.

## 2018-07-14 NOTE — Assessment & Plan Note (Signed)
S: Filtration rate has been in the 50s range.  Patient knows to avoid NSAIDs.  Fortunately blood pressure and cholesterol are controlled as well as diabetes A/P:  Update BMP- try to lower uric acid as well

## 2018-07-14 NOTE — Progress Notes (Signed)
Subjective:  Matthew [name] is a 78 y.o. year old very pleasant male patient who presents for/with See problem oriented charting ROS- No chest pain or shortness of breath. No headache or blurry vision. Still with some claudication.    Past Medical History-  Patient Active Problem List   Diagnosis Date Noted  . Peripheral vascular disease-claudicatoin 05/14/2007    Priority: High  . Diabetes mellitus type II, controlled ([name]) 12/28/2006    Priority: High  .  Coronary artery disease s/p CABG 1997 12/28/2006    Priority: High  . Cervical arthritis ([place]) 10/25/2014    Priority: Medium  . Erectile dysfunction 08/13/2010    Priority: Medium  . CKD (chronic kidney disease), stage III ([name]) 09/15/2008    Priority: Medium  . Hyperlipidemia 12/28/2006    Priority: Medium  . 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 Essential hypertension 12/28/2006    Priority: Medium  . Pulmonary nodule seen on imaging study 05/17/2012    Priority: Low  . Allergic rhinitis 11/18/2016   Medications- reviewed and updated Current Outpatient Medications  Medication Sig Dispense Refill  . allopurinol (ZYLOPRIM) 300 MG tablet Take 1 tablet (300 mg total) by mouth daily. 30 tablet 6  . aspirin 81 MG tablet Take 81 mg by mouth daily.      Marland Kitchen atorvastatin (LIPITOR) 40 MG tablet TAKE ONE TABLET BY MOUTH ONCE DAILY 90 tablet 3  . azelastine (ASTELIN) 0.1 % nasal spray Place 1 spray into both nostrils 2 (two) times daily. Use in each nostril as directed 30 mL 5  . chlorthalidone (HYGROTON) 25 MG tablet TAKE 1 TABLET BY MOUTH ONCE DAILY 90 tablet 2  . colchicine (COLCRYS) 0.6 MG tablet Take 1 tablet (0.6 mg total) by mouth daily. 30 tablet 2  . cyclobenzaprine (FLEXERIL) 5 MG tablet TAKE ONE TABLET BY MOUTH TWICE DAILY AS NEEDED FOR MUSCLE SPASM 30 tablet 0  . diclofenac sodium (VOLTAREN) 1 % GEL Apply topically to affected area qid 100 g 2  . fexofenadine (ALLEGRA) 180 MG tablet Take 180 mg by mouth  daily.    . fluticasone (FLONASE) 50 MCG/ACT nasal spray USE 2 SPRAYS IN EACH NOSTRIL DAILY AS NEEDED FOR ALLERGIES OR RHINITIS (NASAL CONGESTION) 16 g 5  . glimepiride (AMARYL) 1 MG tablet TAKE 1 TABLET BY MOUTH ONCE DAILY BEFORE BREAKFAST 90 tablet 0  . glucose 4 GM chewable tablet Chew 1 tablet by mouth once as needed for low blood sugar. Reported on 12/26/2015    . lisinopril (PRINIVIL,ZESTRIL) 40 MG tablet TAKE ONE TABLET BY MOUTH ONCE DAILY 90 tablet 3  . metoprolol succinate (TOPROL-XL) 100 MG 24 hr tablet TAKE 1 TABLET EVERY DAY 90 tablet 3  . NIFEdipine (PROCARDIA-XL) 30 MG (OSM) 24 hr tablet Take 30 mg by mouth 2 (two) times daily.      Marland Kitchen NOVOLIN 70/30 RELION (70-30) 100 UNIT/ML injection INJECT 22 UNITS SUBCUTANEOUSLY IN THE MORNING AND 20 IN THE EVENING 10 mL 3  . Omega-3 Fatty Acids (FISH OIL PO) Take 1,400 mg by mouth.    . pioglitazone (ACTOS) 30 MG tablet TAKE ONE TABLET BY MOUTH ONCE DAILY 90 tablet 3   No current facility-administered medications for this visit.     Objective: BP 120/66 (BP Location: Left Arm, Patient Position: Sitting, Cuff Size: Large)   Pulse 60   Temp 97.6 F (36.4 C) (Oral)   Ht 6' (1.829 m)   Wt 199 lb 3.2 oz (90.4 kg)  BMI 27.02 kg/m  Gen: NAD, resting comfortably CV: RRR no murmurs rubs or gallops Lungs: CTAB no crackles, wheeze, rhonchi Abdomen: soft/nontender/nondistended/normal bowel sounds.  Ext: no edema Skin: warm, dry Neuro: grossly normal, moves all extremities  Assessment/Plan:  Other notes: 1.CAD managed by Dr. Burt Knack- on aspirin- saw him in the fall   Diabetes mellitus type II, controlled ([name]) S: Reasonably controlled on insulin 70/30-22 units in the morning 20 units in the evening-he does some mild adjustments at home intermittently.  Has not tolerated metformin in the past due to GI upset.  He is also on Actos 30 mg and Amaryl 1 mg-could stop the Amaryl  CBGs- lowest he has seen was 77 this morning Exercise and  diet-weight up about 5 pounds from 2018 Lab Results  Component Value Date   HGBA1C 7.1 (A) 07/14/2018   HGBA1C 6.8 03/19/2017   HGBA1C 7.7 (H) 11/18/2016  A/P: I believe A1c goal of 7.5 or less is reasonable-continue current medication   Essential hypertension S: controlled on chlorthalidone 25 mg, lisinopril 40 mg, Toprol 100 mg, nifedipine 30 mg extended release BP Readings from Last 3 Encounters:  07/14/18 120/66  05/31/18 135/66  05/03/18 134/70  A/P: blood pressure goal of <140/90. Continue current meds  Hyperlipidemia S: Well controlled on last check on atorvastatin 40 mg Lab Results  Component Value Date   CHOL 115 11/18/2016   HDL 34.70 (L) 11/18/2016   LDLCALC 66 11/18/2016   LDLDIRECT 79.0 07/19/2015   TRIG 72.0 11/18/2016   CHOLHDL 3 11/18/2016   A/P: Hopefully stable-update lipid panel     Gout with tophi S: Patient with very elevated uric acid in 2018-we started him on allopurinol at that time.  He stopped taking the medication.   He has had 2 episodes of gout since last visit- used colchicine. He is now taking colchicine regularly.  Lab Results  Component Value Date   LABURIC 8.9 (H) 06/19/2017   A/P:  We discussed colchicine does not lower overall uric acid levels like allopurinol and with kidney disease probably better to be on allopurinol. He is going to try it one more time but if gets itching issue again he is going to stop and stick with colchicine. uloric- not advisable as increases cardiovascular risk.   CKD (chronic kidney disease), stage III S: Filtration rate has been in the 50s range.  Patient knows to avoid NSAIDs.  Fortunately blood pressure and cholesterol are controlled as well as diabetes A/P:  Update BMP- try to lower uric acid as well  4-6 month follow up   Lab/Order associations: has had some OJ this morning Controlled type 2 diabetes mellitus without complication, with long-term current use of insulin ([name]) - Plan: POCT glycosylated  hemoglobin (Hb A1C), CBC, Lipid panel, Comprehensive metabolic panel  Essential hypertension  Hyperlipidemia, unspecified hyperlipidemia type  Gout with tophi - Plan: Uric acid, Uric acid  CKD (chronic kidney disease), stage III ([place])  Meds ordered this encounter  Medications  . allopurinol (ZYLOPRIM) 300 MG tablet    Sig: Take 1 tablet (300 mg total) by mouth daily.    Dispense:  30 tablet    Refill:  6   Patient Instructions   Health Maintenance Due  Topic Date Due  . HEMOGLOBIN A1C -completed today 09/16/2017  . OPHTHALMOLOGY EXAM -called [facility] to ask that they fax report 12/30/2017  . TETANUS/TDAP -please go by your pharmacy and let us know when you receive this 03/03/2018    Please  stop by lab before you go   We discussed colchicine does not lower overall uric acid levels like allopurinol and with kidney disease probably better to be on allopurinol. He is going to try it one more time but if gets itching issue again he is going to stop and stick with colchicine. uloric- not advisable as increases cardiovascular risk. Take colchicine and allopurinol for first 2 weeks. Come back for uric acid level in 2 months  - schedule a lab visit for this  4-6 months follow up for diabetes- please schedule before you leave    Return precautions advised.  Garret Reddish, MD

## 2018-07-14 NOTE — Assessment & Plan Note (Signed)
S: Well controlled on last check on atorvastatin 40 mg Lab Results  Component Value Date   CHOL 115 11/18/2016   HDL 34.70 (L) 11/18/2016   LDLCALC 66 11/18/2016   LDLDIRECT 79.0 07/19/2015   TRIG 72.0 11/18/2016   CHOLHDL 3 11/18/2016   A/P: Hopefully stable-update lipid panel

## 2018-07-16 ENCOUNTER — Other Ambulatory Visit: Payer: Self-pay | Admitting: Family Medicine

## 2018-07-16 MED ORDER — CYCLOBENZAPRINE HCL 5 MG PO TABS: ORAL_TABLET | ORAL | 0 refills | Status: AC

## 2018-07-16 NOTE — Telephone Encounter (Signed)
See note

## 2018-07-16 NOTE — Telephone Encounter (Signed)
Copied from [place] [phone]. Topic: Quick Communication - Rx Refill/Question >> Jul 16, 2018  3:51 PM Blase Mess A wrote: Medication: cyclobenzaprine (FLEXERIL) 5 MG tablet [[id number]]   Has the patient contacted their pharmacy? Yes  (Agent: If no, request that the patient contact the pharmacy for the refill.) (Agent: If yes, when and what did the pharmacy advise?)  Preferred Pharmacy (with phone number or street name): [place], [place] [phone] (Phone) [phone] (Fax)    Agent: Please be advised that RX refills may take up to 3 business days. We ask that you follow-up with your pharmacy.

## 2018-07-27 ENCOUNTER — Ambulatory Visit: Payer: Self-pay

## 2018-07-27 DIAGNOSIS — M1A9XX1 Chronic gout, unspecified, with tophus (tophi): Secondary | ICD-10-CM

## 2018-07-27 NOTE — Assessment & Plan Note (Signed)
See phone call from today "You may stop the allopurinol-please list it as an allergy under itching if not already listed.  Make sure to remove allopurinol from the list.  You may refill his colchicine"

## 2018-07-27 NOTE — Telephone Encounter (Signed)
Pt. Reports he tried the Allopurinol and had the itching again.No rash. Took Benadryl which stops the itching. Request Dr. Yong Channel refill his Colchicine for him - [place]

## 2018-07-27 NOTE — Telephone Encounter (Signed)
See note

## 2018-07-27 NOTE — Telephone Encounter (Signed)
You may stop the allopurinol-please list it as an allergy under itching if not already listed.  Make sure to remove allopurinol from the list.  You may refill his colchicine

## 2018-07-28 ENCOUNTER — Other Ambulatory Visit: Payer: Self-pay

## 2018-07-28 MED ORDER — COLCHICINE 0.6 MG PO TABS: 0.6000 mg | ORAL_TABLET | Freq: Every day | ORAL | 2 refills | Status: DC

## 2018-07-28 NOTE — Telephone Encounter (Signed)
Allopurinol d/c on med list and added to allergy list with side effect of itching.

## 2018-07-28 NOTE — Addendum Note (Signed)
Addended by: Jasper Loser on: 07/28/2018 10:29 AM   Modules accepted: Orders

## 2018-07-30 ENCOUNTER — Other Ambulatory Visit: Payer: Self-pay | Admitting: Family Medicine

## 2018-08-12 ENCOUNTER — Telehealth: Payer: Self-pay

## 2018-08-12 NOTE — Telephone Encounter (Signed)
Prior Authorization submitted for colchicine (COLCRYS) 0.6 MG tablet. Pending approval. Ref #[id number]

## 2018-08-17 ENCOUNTER — Telehealth: Payer: Self-pay | Admitting: Family Medicine

## 2018-08-17 NOTE — Telephone Encounter (Signed)
Spoke to pt regarding the issue. Pt gave me the number for [facility] for clarification.

## 2018-08-17 NOTE — Telephone Encounter (Signed)
See note

## 2018-08-17 NOTE — Telephone Encounter (Signed)
[facility]  Fax# [phone]

## 2018-08-17 NOTE — Telephone Encounter (Signed)
Copied from [place] [id number]. Topic: General - Inquiry >> Aug 17, 2018 12:20 PM Margot Ables wrote: Reason for CRM: Pt asking if order/request has been received from [facility] for diabetic testing supplies. They faxed 2+ weeks ago. His previous provider was no longer servicing testing supplies. Pt has a [employer] meter currently but he is having to change meter and supplies. Please follow up with pt regarding supplies.

## 2018-08-18 NOTE — Telephone Encounter (Signed)
Spoke to [place] from [facility]. He stated that he sent fax information for pt to receive his diabetic supplies. I advised Bess Harvest that we did not receive the faxed ppw. Bess Harvest stated he will fax it now.  Pt will be updated.

## 2018-08-18 NOTE — Telephone Encounter (Signed)
Pt notified of update.  

## 2018-08-23 ENCOUNTER — Other Ambulatory Visit: Payer: Self-pay

## 2018-08-23 NOTE — Telephone Encounter (Signed)
See note

## 2018-08-23 NOTE — Telephone Encounter (Signed)
Pt called in to follow up on the receipt of diabetic supplies. Pt says that he was told by [facility] that his PCP is requiring an ov, pt says that he was not aware of this. Pt would like to verify, he stated that he will come in if need be but he feels that he was just seen by PCP and is not quite sure of why an ov would be needed.   Please advise.

## 2018-08-24 ENCOUNTER — Telehealth: Payer: Self-pay

## 2018-08-24 NOTE — Telephone Encounter (Signed)
Denial received for Colchicine 0.6 mg reason:  The drug is non formulary (not on [employer]'s list of preferred drugs) befor the drug can be covered we need more information as to why the covered drugs will not work. Marland Kitchen

## 2018-08-24 NOTE — Telephone Encounter (Signed)
Paperwork has been faxed. Pt was notified of update.

## 2018-08-25 NOTE — Telephone Encounter (Signed)
Thanks Joellen- what are the covered options?

## 2018-08-26 NOTE — Telephone Encounter (Signed)
We normally have the patient call and ask the insurance what they cover if it is not listed on the denial form ( which it was not) do you want me to call him and ask him to call?

## 2018-08-26 NOTE — Telephone Encounter (Signed)
Yes thanks 

## 2018-09-01 NOTE — Telephone Encounter (Signed)
Patient stated insurance price will be 152 dollars.I suggested that he use [facility] at [facility] due to Rx cheaper at 56.00. Patient will try [facility].

## 2018-09-01 NOTE — Telephone Encounter (Signed)
See note

## 2018-09-01 NOTE — Telephone Encounter (Signed)
Patient returning call to [facility]. Please advise.

## 2018-09-01 NOTE — Telephone Encounter (Signed)
Left message to return phone call.

## 2018-09-27 ENCOUNTER — Other Ambulatory Visit: Payer: Self-pay | Admitting: Family Medicine

## 2018-10-03 ENCOUNTER — Other Ambulatory Visit: Payer: Self-pay | Admitting: Family Medicine

## 2018-10-28 ENCOUNTER — Other Ambulatory Visit: Payer: Self-pay | Admitting: Family Medicine

## 2018-11-05 ENCOUNTER — Other Ambulatory Visit: Payer: Self-pay | Admitting: Family Medicine

## 2018-11-05 NOTE — Telephone Encounter (Signed)
Last OV 07/14/2018 Last refill 07/30/18 #10 mL/2 Next OV 11/10/2018

## 2018-11-10 ENCOUNTER — Encounter: Payer: Self-pay | Admitting: Family Medicine

## 2018-11-10 ENCOUNTER — Telehealth: Payer: Self-pay | Admitting: Family Medicine

## 2018-11-10 ENCOUNTER — Other Ambulatory Visit: Payer: Self-pay

## 2018-11-10 ENCOUNTER — Ambulatory Visit (INDEPENDENT_AMBULATORY_CARE_PROVIDER_SITE_OTHER): Payer: Medicare Other | Admitting: Family Medicine

## 2018-11-10 VITALS — BP 133/72 | HR 67 | Temp 97.1°F | Ht 72.0 in | Wt 201.0 lb

## 2018-11-10 DIAGNOSIS — E1169 Type 2 diabetes mellitus with other specified complication: Secondary | ICD-10-CM | POA: Diagnosis not present

## 2018-11-10 DIAGNOSIS — Z794 Long term (current) use of insulin: Secondary | ICD-10-CM | POA: Diagnosis not present

## 2018-11-10 DIAGNOSIS — E785 Hyperlipidemia, unspecified: Secondary | ICD-10-CM | POA: Diagnosis not present

## 2018-11-10 DIAGNOSIS — E1159 Type 2 diabetes mellitus with other circulatory complications: Secondary | ICD-10-CM | POA: Diagnosis not present

## 2018-11-10 DIAGNOSIS — M1A9XX1 Chronic gout, unspecified, with tophus (tophi): Secondary | ICD-10-CM

## 2018-11-10 DIAGNOSIS — Z7189 Other specified counseling: Secondary | ICD-10-CM | POA: Diagnosis not present

## 2018-11-10 DIAGNOSIS — I10 Essential (primary) hypertension: Secondary | ICD-10-CM

## 2018-11-10 DIAGNOSIS — N183 Chronic kidney disease, stage 3 unspecified: Secondary | ICD-10-CM

## 2018-11-10 DIAGNOSIS — I2510 Atherosclerotic heart disease of native coronary artery without angina pectoris: Secondary | ICD-10-CM

## 2018-11-10 DIAGNOSIS — E1122 Type 2 diabetes mellitus with diabetic chronic kidney disease: Secondary | ICD-10-CM

## 2018-11-10 DIAGNOSIS — E663 Overweight: Secondary | ICD-10-CM | POA: Diagnosis not present

## 2018-11-10 NOTE — Assessment & Plan Note (Signed)
S: has had a few flare ups of gout in elbow and foot. Taking colchicine daily.  They seem to have longer duration which is bothering him.  A/P: Patient has gout and is on colchicine daily- he is still getting some pretty painful flare ups a times- with his diabetes I am hesitant to use prednisone for flares. He has intense itching on allopurinol even low dose. Has CKD III so somewhat hesitant about probenacid (though GFR is usually above 50). I mentioned to him uloric and reviewed potential CV risks- he did not want to move forward with this unless Dr. Burt Knack weighed in so I will reach out to Dr. Burt Knack

## 2018-11-10 NOTE — Progress Notes (Signed)
Didn't know this but looks like a [place] box warning of increased CV death in patient's with known CV disease. I would probably avoid it if possible. Sorry I know there aren't a lot of options here. thx Annie Main

## 2018-11-10 NOTE — Patient Instructions (Addendum)
Health Maintenance Due  Topic Date Due  . OPHTHALMOLOGY EXAM Postponed 12/30/2017  . TETANUS/TDAP Will discuss during visit. 03/03/2018   Depression screen PHQ 2/9 07/14/2018 07/02/2016 12/25/2015  Decreased Interest 0 0 0  Down, Depressed, Hopeless 0 0 0  PHQ - 2 Score 0 0 0

## 2018-11-10 NOTE — Telephone Encounter (Signed)
Copied from [place] [phone]. Topic: General - Other >> Nov 10, 2018  3:15 PM Pauline Good wrote: Reason for CRM: pt returning call to [facility]

## 2018-11-10 NOTE — Progress Notes (Addendum)
Phone [phone]   Subjective:  Virtual visit via Video note. Chief complaint: Chief Complaint  Patient presents with  . Diabetes    4 month follow up   This visit type was conducted due to national recommendations for restrictions regarding the COVID-19 Pandemic (e.g. social distancing).  This format is felt to be most appropriate for this patient at this time balancing risks to patient and risks to population by having him in for in person visit.  No physical exam was performed (except for noted visual exam or audio findings with Telehealth visits).    Our team/I connected with Matthew [name] on 11/10/18 at  9:40 AM EDT by a video enabled telemedicine application ([email or web address]) and verified that I am speaking with the correct person using two identifiers.  Location patient: [place] Location provider: [facility], office Persons participating in the virtual visit:  patient  Our team/I discussed the limitations of evaluation and management by telemedicine and the availability of in person appointments. In light of current covid-19 pandemic, patient also understands that we are trying to protect them by minimizing in office contact if at all possible.  The patient expressed consent for telemedicine visit and agreed to proceed. Patient understands insurance will be billed.   ROS-  No fever, chills, cough, shortness of breath, body aches, sore throat, or loss of taste or smell   Past Medical History-  Patient Active Problem List   Diagnosis Date Noted  . Peripheral vascular disease-claudicatoin 05/14/2007    Priority: High  . Diabetes mellitus with renal complications ([place]) 78/58/8502    Priority: High  .  Coronary artery disease s/p CABG 1997 12/28/2006    Priority: High  . Cervical arthritis ([place]) 10/25/2014    Priority: Medium  . Erectile dysfunction 08/13/2010    Priority: Medium  . CKD (chronic kidney disease), stage III ([place]) 09/15/2008    Priority: Medium  . Hyperlipidemia  associated with type 2 diabetes mellitus ([place]) 12/28/2006    Priority: Medium  . 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 Hypertension associated with diabetes ([place]) 12/28/2006    Priority: Medium  . Allergic rhinitis 11/18/2016    Priority: Low  . Pulmonary nodule seen on imaging study 05/17/2012    Priority: Low    Medications- reviewed and updated Current Outpatient Medications  Medication Sig Dispense Refill  . aspirin 81 MG tablet Take 81 mg by mouth daily.      Marland Kitchen atorvastatin (LIPITOR) 40 MG tablet Take 1 tablet by mouth once daily 90 tablet 0  . azelastine (ASTELIN) 0.1 % nasal spray USE 1 SPRAY(S) IN EACH NOSTRIL TWICE DAILY AS DIRECTED 30 mL 0  . chlorthalidone (HYGROTON) 25 MG tablet Take 1 tablet by mouth once daily 90 tablet 0  . colchicine (COLCRYS) 0.6 MG tablet Take 1 tablet (0.6 mg total) by mouth daily. 30 tablet 2  . cyclobenzaprine (FLEXERIL) 5 MG tablet TAKE ONE TABLET BY MOUTH TWICE DAILY AS NEEDED FOR MUSCLE SPASM 30 tablet 0  . fexofenadine (ALLEGRA) 180 MG tablet Take 180 mg by mouth daily.    . fluticasone (FLONASE) 50 MCG/ACT nasal spray USE 2 SPRAY(S) IN EACH NOSTRIL ONCE DAILY AS NEEDED FOR ALLERGIES OR  RHINITIS  (NASAL  CONGESTION) 16 g 0  . glimepiride (AMARYL) 1 MG tablet TAKE 1 TABLET BY MOUTH ONCE DAILY BEFORE BREAKFAST 90 tablet 0  . glucose 4 GM chewable tablet Chew 1 tablet by mouth once as needed for low blood sugar.  Reported on 12/26/2015    . lisinopril (ZESTRIL) 40 MG tablet Take 1 tablet by mouth once daily 90 tablet 0  . metoprolol succinate (TOPROL-XL) 100 MG 24 hr tablet TAKE 1 TABLET EVERY DAY 90 tablet 1  . NIFEdipine (PROCARDIA-XL) 30 MG (OSM) 24 hr tablet Take 30 mg by mouth 2 (two) times daily.      Marland Kitchen NOVOLIN 70/30 RELION (70-30) 100 UNIT/ML injection INJECT 22 UNITS INTO THE SKIN IN THE MORNING AND 20 UNITS IN THE EVENING 10 mL 0  . pioglitazone (ACTOS) 30 MG tablet TAKE ONE TABLET BY MOUTH ONCE DAILY 90 tablet 3   No current  facility-administered medications for this visit.      Objective:  BP (!) 157/79 (BP Location: Left Arm, Patient Position: Sitting, Cuff Size: Normal)   Pulse 67   Temp (!) 97.1 F (36.2 C) (Oral)   Ht 6' (1.829 m)   Wt 201 lb (91.2 kg)   BMI 27.26 kg/m  self reported vitals Gen: NAD, resting comfortably Lungs: nonlabored, normal respiratory rate  Skin: appears dry, no obvious rash    Assessment and Plan   # Diabetes S: reasonably  controlled on  amaryl 1 mg. Also novolin 70/30 22 untis AM and 20 units PM and also taking actos. A1c goal 7.5 or less CBGs- running around 90-93 Lab Results  Component Value Date   HGBA1C 7.1 (A) 07/14/2018   HGBA1C 6.8 03/19/2017   HGBA1C 7.7 (H) 11/18/2016   A/P: sounds to be well controlled based off fasting sugars- he will come by for a1c (car visit poc a1c) to confirm . Discussed healthy eating and regular exercise importance- overweight status noted- dont really at his age want him to lose a lot of weight  #hypertension/CKD III S: previously controlled on  chlorthalidone 25mg , lisinopril 40mg , metoprolol 100mg  XR, nifedipine 30mg  XR. Poor control this am but has been rushing to get ready for visit and forgot his meds  GFR has been stable in the 50s and patient avoids NSAIDs BP Readings from Last 3 Encounters:  11/10/18 (!) 157/79  07/14/18 120/66  05/31/18 135/66  A/P:  didn't take medicine yet today- he is going to recheck and let us know after he has some time for BP meds to kick in. Poor control but suspect will improve on repeat. He will call back to update # or do this when comes by for a1c -For CKD we considered updating a BMP today-he would prefer to hold off if possible due to COVID-19 though he is willing to do point-of-care A1c from the car-since he has been so stable with GFR in the 50s we opted to repeat in Addendum BP: 133/72  Much improved! With meds in system   #hyperlipidemia/CAD S: lipids controlled on atorvastatin 40mg    Compliant with ASA for Cad- asymptomatic Lab Results  Component Value Date   CHOL 136 07/14/2018   HDL 52.60 07/14/2018   LDLCALC 70 07/14/2018   LDLDIRECT 79.0 07/19/2015   TRIG 66.0 07/14/2018   CHOLHDL 3 07/14/2018   A/P: Stable x2 . Continue current medications.   # Gout S: has had a few flare ups of gout in elbow and foot. Taking colchicine daily.  They seem to have longer duration which is bothering him.  A/P: Patient has gout and is on colchicine daily- he is still getting some pretty painful flare ups a times- with his diabetes I am hesitant to use prednisone for flares. He has intense itching on allopurinol  even low dose. Has CKD III so somewhat hesitant about probenacid (though GFR is usually above 50). I mentioned to him uloric and reviewed potential CV risks- he did not want to move forward with this unless Dr. Burt Knack weighed in so I will reach out to Dr. Burt Knack  Other notes: 1.provided covid education. He is doing reasonably well. Groceries delivered for most part- occasionally goes to store - but wears masks and gloves  4 months in person visit  Advised- definitely get bmp at that time  Lab/Order associations: Type 2 diabetes mellitus with stage 3 chronic kidney disease, with long-term current use of insulin ([place])  Coronary artery disease involving native coronary artery of native heart without angina pectoris  CKD (chronic kidney disease), stage III ([place])  Hyperlipidemia associated with type 2 diabetes mellitus ([place])  Hypertension associated with diabetes ([place])  Gout with tophi  Educated About Covid-19 Virus Infection  Overweight  Return precautions advised.  Garret Reddish, MD

## 2018-11-10 NOTE — Telephone Encounter (Signed)
See note

## 2018-11-10 NOTE — Telephone Encounter (Signed)
I spoke with pt about Bp readings and informed Dr. Yong Channel.

## 2018-12-04 ENCOUNTER — Other Ambulatory Visit: Payer: Self-pay | Admitting: Family Medicine

## 2018-12-29 ENCOUNTER — Other Ambulatory Visit: Payer: Self-pay | Admitting: Family Medicine

## 2019-01-03 DIAGNOSIS — Z794 Long term (current) use of insulin: Secondary | ICD-10-CM | POA: Diagnosis not present

## 2019-01-03 DIAGNOSIS — E119 Type 2 diabetes mellitus without complications: Secondary | ICD-10-CM | POA: Diagnosis not present

## 2019-01-03 DIAGNOSIS — H25013 Cortical age-related cataract, bilateral: Secondary | ICD-10-CM | POA: Diagnosis not present

## 2019-01-03 LAB — HM DIABETES EYE EXAM

## 2019-01-06 ENCOUNTER — Encounter: Payer: Self-pay | Admitting: Family Medicine

## 2019-01-11 ENCOUNTER — Other Ambulatory Visit: Payer: Self-pay | Admitting: Family Medicine

## 2019-01-11 NOTE — Telephone Encounter (Signed)
From last note " reasonably  controlled on  amaryl 1 mg. Also novolin 70/30 22 untis AM and 20 units PM and also taking actos" - can fill

## 2019-01-11 NOTE — Telephone Encounter (Signed)
Dr Yong Channel, is patient suppose to be taking Actos 30 mg It has not been filled for a year and I did not see if in your last note. Please advise.

## 2019-02-22 LAB — BASIC METABOLIC PANEL
BUN: 22 — AB (ref 4–21)
Creatinine: 1.6 — AB (ref 0.6–1.3)
Potassium: 4.8 (ref 3.4–5.3)
Sodium: 141 (ref 137–147)

## 2019-02-22 LAB — LIPID PANEL
Cholesterol: 139 (ref 0–200)
HDL: 50 (ref 35–70)
LDL Cholesterol: 77
Triglycerides: 60 (ref 40–160)

## 2019-02-22 LAB — HEPATIC FUNCTION PANEL
ALT: 12 (ref 10–40)
AST: 22 (ref 14–40)
Alkaline Phosphatase: 60 (ref 25–125)
Bilirubin, Total: 0.4

## 2019-02-22 LAB — VITAMIN B12: Vitamin B-12: 391

## 2019-02-22 LAB — TSH: TSH: 1.28 (ref 0.41–5.90)

## 2019-02-22 LAB — HEMOGLOBIN A1C: Hemoglobin A1C: 7.5

## 2019-03-01 ENCOUNTER — Encounter: Payer: Self-pay | Admitting: Family Medicine

## 2019-03-10 ENCOUNTER — Telehealth: Payer: Self-pay | Admitting: Family Medicine

## 2019-03-10 NOTE — Telephone Encounter (Signed)
I left a message asking the patient to call me at [phone] to schedule AWV with Courtney. VDM (Dee-Dee) °

## 2019-03-17 DIAGNOSIS — Z23 Encounter for immunization: Secondary | ICD-10-CM | POA: Diagnosis not present

## 2019-03-17 DIAGNOSIS — M109 Gout, unspecified: Secondary | ICD-10-CM | POA: Diagnosis not present

## 2019-03-17 DIAGNOSIS — I129 Hypertensive chronic kidney disease with stage 1 through stage 4 chronic kidney disease, or unspecified chronic kidney disease: Secondary | ICD-10-CM | POA: Diagnosis not present

## 2019-03-17 DIAGNOSIS — N183 Chronic kidney disease, stage 3 (moderate): Secondary | ICD-10-CM | POA: Diagnosis not present

## 2019-03-17 LAB — PTH, INTACT: PTH, Intact: 35

## 2019-03-18 LAB — PTH, INTACT: PTH, Intact: 35

## 2019-03-18 LAB — VITAMIN D 25 HYDROXY (VIT D DEFICIENCY, FRACTURES): Vit D, 25-Hydroxy: 23.4

## 2019-03-24 ENCOUNTER — Ambulatory Visit (INDEPENDENT_AMBULATORY_CARE_PROVIDER_SITE_OTHER): Payer: Medicare Other | Admitting: Family Medicine

## 2019-03-24 ENCOUNTER — Other Ambulatory Visit: Payer: Self-pay

## 2019-03-24 ENCOUNTER — Encounter: Payer: Self-pay | Admitting: Family Medicine

## 2019-03-24 VITALS — BP 124/60 | HR 56 | Temp 98.0°F | Ht 72.0 in | Wt 200.4 lb

## 2019-03-24 DIAGNOSIS — N183 Chronic kidney disease, stage 3 unspecified: Secondary | ICD-10-CM

## 2019-03-24 DIAGNOSIS — I10 Essential (primary) hypertension: Secondary | ICD-10-CM

## 2019-03-24 DIAGNOSIS — E785 Hyperlipidemia, unspecified: Secondary | ICD-10-CM | POA: Diagnosis not present

## 2019-03-24 DIAGNOSIS — Z794 Long term (current) use of insulin: Secondary | ICD-10-CM | POA: Diagnosis not present

## 2019-03-24 DIAGNOSIS — M1A9XX1 Chronic gout, unspecified, with tophus (tophi): Secondary | ICD-10-CM | POA: Diagnosis not present

## 2019-03-24 DIAGNOSIS — I739 Peripheral vascular disease, unspecified: Secondary | ICD-10-CM | POA: Diagnosis not present

## 2019-03-24 DIAGNOSIS — E1122 Type 2 diabetes mellitus with diabetic chronic kidney disease: Secondary | ICD-10-CM

## 2019-03-24 DIAGNOSIS — E1169 Type 2 diabetes mellitus with other specified complication: Secondary | ICD-10-CM

## 2019-03-24 DIAGNOSIS — E1159 Type 2 diabetes mellitus with other circulatory complications: Secondary | ICD-10-CM | POA: Diagnosis not present

## 2019-03-24 DIAGNOSIS — I2510 Atherosclerotic heart disease of native coronary artery without angina pectoris: Secondary | ICD-10-CM

## 2019-03-24 NOTE — Patient Instructions (Addendum)
Health Maintenance Due  Topic Date Due  . TETANUS/TDAP - at your pharmacy if yo uwould like to get this. Or if you get a cut/scrape- they will pay for it here.  03/03/2018  . INFLUENZA VACCINE -09/172020 (nephrology office)- thanks for doing this.   Sign release of information at the check out desk for flu shot record from nephrology  01/29/2019   Great to see you today!   3-4 month follow up to recheck the a1c. Hoping this trends back down and stresses stay behind you!

## 2019-03-24 NOTE — Progress Notes (Signed)
Phone [phone]   Subjective:  Matthew [name] is a 78 y.o. year old very pleasant male patient who presents for/with See problem oriented charting Chief Complaint  Patient presents with  . Follow-up  . Diabetes  . Hypertension  . Hyperlipidemia  . Gout  . Chronic Kidney Disease   ROS- No chest pain or shortness of breath. No headache or blurry vision. Still with claudication if walks long distance or really pushes it- resolves with slowing down- usually around half mile   Past Medical History-  Patient Active Problem List   Diagnosis Date Noted  . PAD (peripheral artery disease) (HCC)-with claudication 05/14/2007    Priority: High  . Diabetes mellitus with renal complications ([place]) [id number]    Priority: High  .  Coronary artery disease s/p CABG 1997 12/28/2006    Priority: High  . Cervical arthritis ([place]) 10/25/2014    Priority: Medium  . Erectile dysfunction 08/13/2010    Priority: Medium  . CKD (chronic kidney disease), stage III ([place]) 09/15/2008    Priority: Medium  . Hyperlipidemia associated with type 2 diabetes mellitus ([place]) 12/28/2006    Priority: Medium  . 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 Hypertension associated with diabetes ([place]) 12/28/2006    Priority: Medium  . Allergic rhinitis 11/18/2016    Priority: Low  . Pulmonary nodule seen on imaging study 05/17/2012    Priority: Low    Medications- reviewed and updated Current Outpatient Medications  Medication Sig Dispense Refill  . aspirin 81 MG tablet Take 81 mg by mouth daily.      Marland Kitchen atorvastatin (LIPITOR) 40 MG tablet Take 1 tablet by mouth once daily 90 tablet 0  . azelastine (ASTELIN) 0.1 % nasal spray USE 1 SPRAY(S) IN EACH NOSTRIL TWICE DAILY AS DIRECTED 30 mL 0  . chlorthalidone (HYGROTON) 25 MG tablet Take 1 tablet by mouth once daily 90 tablet 0  . colchicine (COLCRYS) 0.6 MG tablet Take 1 tablet (0.6 mg total) by mouth daily. 30 tablet 2  . cyclobenzaprine (FLEXERIL) 5 MG  tablet TAKE ONE TABLET BY MOUTH TWICE DAILY AS NEEDED FOR MUSCLE SPASM 30 tablet 0  . fexofenadine (ALLEGRA) 180 MG tablet Take 180 mg by mouth daily.    . fluticasone (FLONASE) 50 MCG/ACT nasal spray USE TWO SPRAYS IN EACH NOSTRIL ONCE DAILY AS NEEDED FOR ALLERGIES OR RHINITIS (NASAL CONGESTION) 16 g 0  . glimepiride (AMARYL) 1 MG tablet TAKE 1 TABLET BY MOUTH ONCE DAILY BEFORE BREAKFAST 90 tablet 0  . glucose 4 GM chewable tablet Chew 1 tablet by mouth once as needed for low blood sugar. Reported on 12/26/2015    . lisinopril (ZESTRIL) 40 MG tablet Take 1 tablet by mouth once daily 90 tablet 0  . metoprolol succinate (TOPROL-XL) 100 MG 24 hr tablet TAKE 1 TABLET EVERY DAY 90 tablet 1  . NIFEdipine (PROCARDIA-XL) 30 MG (OSM) 24 hr tablet Take 30 mg by mouth 2 (two) times daily.      Marland Kitchen NOVOLIN 70/30 RELION (70-30) 100 UNIT/ML injection INJECT 22 UNITS INTO THE SKIN IN THE MORNING AND 20 UNITS IN THE EVENING 10 mL 5  . pioglitazone (ACTOS) 30 MG tablet Take 1 tablet by mouth once daily 90 tablet 0   No current facility-administered medications for this visit.      Objective:  BP 124/60   Pulse (!) 56   Temp 98 F (36.7 C)   Ht 6' (1.829 m)   Wt 200 lb  6.4 oz (90.9 kg)   SpO2 98%   BMI 27.18 kg/m  Gen: NAD, resting comfortably CV: RRR no murmurs rubs or gallops Lungs: CTAB no crackles, wheeze, rhonchi Abdomen: soft/nontender/nondistended/normal bowel sounds.  Ext: no edema Skin: warm, dry    Assessment and Plan   # Diabetes S:  controlled on Glimepiride 1 mg daily, Actos 30 mg daily, and Novolin 70/30 22 units qAM and 20 units qPM. Pt states his blood sugars are ranging in the high 80s fasting. CBGs- range between 86-89 sometime dropping down in the 70s Exercise and diet- pt states his diet is reasonably well and exercise is still desired. Felt like last a1c swinging up was associated with a lot of financial stress and other changes with covid 19- he has improved since that time.   Lab Results  Component Value Date   HGBA1C 7.5 02/22/2019   HGBA1C 7.1 (A) 07/14/2018   HGBA1C 6.8 03/19/2017   A/P: likely stable/improving after prior stressors with high a1c last month- we will recheck at follow up in 3-4 months or so  #hypertension/CKD Stage III S: controlled on Chlorthalidone 25 mg daily, Lisinopril 40 mg daily, Metoprolol XL 100 mg daily, and Nifedipine XL 30 mg BID. Pt states his bp ranges 130/80.  Creatinine ranging 1.5-1.7- also sees [place] [facility] BP Readings from Last 3 Encounters:  03/24/19 124/60  11/10/18 133/72  07/14/18 120/66  A/P: Hypertension is well controlled-continue current medications CKD stage III stable-continue follow-up with nephrology as well   #hyperlipidemia/CAD/PAD S:  controlled on Atorvastatin 40 mg daily. Pt denies muscle or leg cramps and is tolerating Statin well.  For CAD-patient has not had any chest pain or shortness of breath.  For PAD-has stable claudication as above.  He is compliant with aspirin Lab Results  Component Value Date   CHOL 139 02/22/2019   HDL 50 02/22/2019   LDLCALC 77 02/22/2019   LDLDIRECT 79.0 07/19/2015   TRIG 60 02/22/2019   CHOLHDL 3 07/14/2018   A/P: Reasonably controlled-continue current medication  # Gout S: Pt has had a few gout flare up last one being 1.5 months ago in his right foot and right finger.  Did not tolerate allopurinol- started itching again in his scalp-simply not tolerable.  A/P: Uloric not a good option due to CAD history  Recommended follow up: 3 to 41-month follow-up to recheck A1c given worsening trend Future Appointments  Date Time Provider [place]  07/27/2019 11:00 AM Marin Olp, MD [facility]   Lab/Order associations:   ICD-10-CM   1. Type 2 diabetes mellitus with stage 3 chronic kidney disease, with long-term current use of insulin (HCC)  E11.22    N18.3    Z79.4   2. PAD (peripheral artery disease) (HCC) Chronic I73.9   3. Coronary artery  disease involving native coronary artery of native heart without angina pectoris  I25.10   4. CKD (chronic kidney disease), stage III (HCC)  N18.3   5. Hypertension associated with diabetes ([place])  E11.59    I10   6. Hyperlipidemia associated with type 2 diabetes mellitus (HCC)  E11.69    E78.5   7. Gout with tophi  [id number].[id number]    Return precautions advised.  Garret Reddish, MD

## 2019-04-01 ENCOUNTER — Encounter: Payer: Self-pay | Admitting: Family Medicine

## 2019-04-12 ENCOUNTER — Encounter: Payer: Self-pay | Admitting: Family Medicine

## 2019-05-05 ENCOUNTER — Ambulatory Visit (INDEPENDENT_AMBULATORY_CARE_PROVIDER_SITE_OTHER): Payer: Medicare Other | Admitting: Cardiovascular Disease

## 2019-05-05 ENCOUNTER — Encounter: Payer: Self-pay | Admitting: Cardiovascular Disease

## 2019-05-05 ENCOUNTER — Other Ambulatory Visit: Payer: Self-pay

## 2019-05-05 VITALS — BP 136/74 | HR 63 | Ht 72.0 in | Wt 198.1 lb

## 2019-05-05 DIAGNOSIS — N1831 Chronic kidney disease, stage 3a: Secondary | ICD-10-CM | POA: Diagnosis not present

## 2019-05-05 DIAGNOSIS — E782 Mixed hyperlipidemia: Secondary | ICD-10-CM | POA: Diagnosis not present

## 2019-05-05 DIAGNOSIS — I10 Essential (primary) hypertension: Secondary | ICD-10-CM | POA: Diagnosis not present

## 2019-05-05 DIAGNOSIS — I2510 Atherosclerotic heart disease of native coronary artery without angina pectoris: Secondary | ICD-10-CM

## 2019-05-05 NOTE — Patient Instructions (Signed)

## 2019-05-05 NOTE — Progress Notes (Signed)
Cardiology Office Note:    Date:  05/05/2019   ID:  Matthew [name], DOB [birth year], MRN [id number]  PCP:  Matthew Olp, MD  Cardiologist:  Matthew Mocha, MD  Electrophysiologist:  None   Referring MD: Matthew Olp, MD   Chief Complaint  Patient presents with  . Coronary Artery Disease    History of Present Illness:    Matthew [name] is a 78 y.o. male with a hx of CAD, presenting for follow-up evaluation.   The patient underwent remote CABG in 1997. Also has peripheral arterial disease with bilateral SFA occlusion managed medically. His last nuclear stress test in 2013 showed normal perfusion with an LVEF of 60%.  He is here alone today.  He has been doing well from a cardiac perspective.  He specifically denies chest pain, chest pressure, shortness of breath, orthopnea, PND, or heart palpitations.  He has chronic swelling in the right leg after vein graft harvesting remotely.  This is unchanged over time.  He continues to walk regularly for exercise with no exertional symptoms.  Past Medical History:  Diagnosis Date  . CLAUDICATION 05/14/2007  . CORONARY ARTERY DISEASE 12/28/2006  . Diabetes mellitus type II, controlled ([place]) 12/28/2006   Actos 30mg , insulin 70/30 20 units BID, amaryl 1mg  previously a1c <8. Worsened due to cold over 5 weeks around 05/2014 eating poorly and eating sugary syrups.   Stomach irritation on metformin.  Lab Results  Component Value Date   HGBA1C 8.7* 06/05/2014       . DIABETES MELLITUS, TYPE II 12/28/2006  . Duodenal ulcer   . Erosive gastritis   . GERD (gastroesophageal reflux disease)   . GOUT 12/28/2006  . Hemorrhoids   . Hiatal hernia   . HYPERLIPIDEMIA 12/28/2006  . HYPERTENSION 12/28/2006  . LATERAL EPICONDYLITIS, RIGHT 12/11/2009  . Raynaud's disease   . RENAL FAILURE, CHRONIC 09/15/2008    Past Surgical History:  Procedure Laterality Date  . COLONOSCOPY    . CORONARY ARTERY BYPASS GRAFT    . KNEE ARTHROSCOPY  2012    Current  Medications: Current Meds  Medication Sig  . aspirin 81 MG tablet Take 81 mg by mouth daily.    Marland Kitchen atorvastatin (LIPITOR) 40 MG tablet Take 1 tablet by mouth once daily  . azelastine (ASTELIN) 0.1 % nasal spray USE 1 SPRAY(S) IN EACH NOSTRIL TWICE DAILY AS DIRECTED  . chlorthalidone (HYGROTON) 25 MG tablet Take 1 tablet by mouth once daily  . colchicine (COLCRYS) 0.6 MG tablet Take 1 tablet (0.6 mg total) by mouth daily.  . cyclobenzaprine (FLEXERIL) 5 MG tablet TAKE ONE TABLET BY MOUTH TWICE DAILY AS NEEDED FOR MUSCLE SPASM  . fexofenadine (ALLEGRA) 180 MG tablet Take 180 mg by mouth daily.  . fluticasone (FLONASE) 50 MCG/ACT nasal spray USE TWO SPRAYS IN EACH NOSTRIL ONCE DAILY AS NEEDED FOR ALLERGIES OR RHINITIS (NASAL CONGESTION)  . glimepiride (AMARYL) 1 MG tablet TAKE 1 TABLET BY MOUTH ONCE DAILY BEFORE BREAKFAST  . glucose 4 GM chewable tablet Chew 1 tablet by mouth once as needed for low blood sugar. Reported on 12/26/2015  . lisinopril (ZESTRIL) 40 MG tablet Take 1 tablet by mouth once daily  . metoprolol succinate (TOPROL-XL) 100 MG 24 hr tablet TAKE 1 TABLET EVERY DAY  . NIFEdipine (PROCARDIA-XL) 30 MG (OSM) 24 hr tablet Take 30 mg by mouth 2 (two) times daily.    Marland Kitchen NOVOLIN 70/30 RELION (70-30) 100 UNIT/ML injection INJECT 22 UNITS INTO THE SKIN IN  THE MORNING AND 20 UNITS IN THE EVENING  . pioglitazone (ACTOS) 30 MG tablet Take 1 tablet by mouth once daily     Allergies:   Penicillins, Tetracycline hcl, and Allopurinol   Social History   Socioeconomic History  . Marital status: Married    Spouse name: Not on file  . Number of children: Not on file  . Years of education: Not on file  . Highest education level: Not on file  Occupational History  . Occupation: retired  Scientific laboratory technician  . Financial resource strain: Not on file  . Food insecurity    Worry: Not on file    Inability: Not on file  . Transportation needs    Medical: Not on file    Non-medical: Not on file   Tobacco Use  . Smoking status: Former Smoker    Types: Cigarettes    Quit date: 07/09/1968    Years since quitting: 50.8  . Smokeless tobacco: Former Systems developer    Types: Chew    Quit date: 07/09/1988  . Tobacco comment: minimal use x 10 years   Substance and Sexual Activity  . Alcohol use: No    Comment: last was 6 months  . Drug use: No  . Sexual activity: Not on file  Lifestyle  . Physical activity    Days per week: Not on file    Minutes per session: Not on file  . Stress: Not on file  Relationships  . Social Herbalist on phone: Not on file    Gets together: Not on file    Attends religious service: Not on file    Active member of club or organization: Not on file    Attends meetings of clubs or organizations: Not on file    Relationship status: Not on file  Other Topics Concern  . Not on file  Social History Narrative   Married 1983. 2 sons. No grandkids yet.       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       Hobbies: volunteer work, watch sports     Family History: The patient's family history includes Diabetes in his mother; Hypertension in his mother; Stroke in his father. There is no history of Colon cancer.  ROS:   Please see the history of present illness.    All other systems reviewed and are negative.  EKGs/Labs/Other Studies Reviewed:    EKG:  EKG is ordered today.  The ekg ordered today demonstrates normal sinus rhythm 63 bpm with first-degree AV block, possible age-indeterminate anterior infarct, otherwise normal.  Recent Labs: 07/14/2018: Hemoglobin 14.9; Platelets 163.0 02/22/2019: ALT 12; BUN 22; Creatinine 1.6; Potassium 4.8; Sodium 141; TSH 1.28  Recent Lipid Panel    Component Value Date/Time   CHOL 139 02/22/2019   TRIG 60 02/22/2019   HDL 50 02/22/2019   CHOLHDL 3 07/14/2018 1015   VLDL 13.2 07/14/2018 1015   LDLCALC 77 02/22/2019   LDLDIRECT 79.0 07/19/2015 1107     Physical Exam:    VS:  BP 136/74   Pulse 63   Ht 6' (1.829 m)   Wt 198 lb 1.9 oz (89.9 kg)   SpO2 99%   BMI 26.87 kg/m     Wt Readings from Last 3 Encounters:  05/05/19 198 lb 1.9 oz (89.9 kg)  03/24/19 200 lb 6.4 oz (90.9 kg)  11/10/18 201 lb (91.2 kg)     GEN: Well nourished,  well developed in no acute distress HEENT: Normal NECK: No JVD; No carotid bruits LYMPHATICS: No lymphadenopathy CARDIAC: RRR, no murmurs, rubs, gallops RESPIRATORY:  Clear to auscultation without rales, wheezing or rhonchi  ABDOMEN: Soft, non-tender, non-distended MUSCULOSKELETAL:  No edema; No deformity  SKIN: Warm and dry NEUROLOGIC:  Alert and oriented x 3 PSYCHIATRIC:  Normal affect   ASSESSMENT:    1. Coronary artery disease involving native coronary artery of native heart without angina pectoris   2. Mixed hyperlipidemia   3. Essential hypertension   4. Stage 3a chronic kidney disease    PLAN:    In order of problems listed above:  1. Stable with no symptoms of angina.  Medical program reviewed and will be continued without change.  Treated with aspirin and high intensity statin drug. 2. Lipids reviewed with LDL cholesterol 77, HDL 50, total cholesterol 139, triglycerides 60.  Treated with atorvastatin 40 mg. 3. Blood pressure well controlled on combination of chlorthalidone, lisinopril, metoprolol, and nifedipine. 4. Reviewed creatinine levels over time and his renal function is stable with most recent creatinine 1.6 mg/dL placing him in a stage IIIa chronic kidney disease category.  We discussed avoidance of all NSAIDs.  Appropriately treated with an ACE inhibitor.  Blood pressure at goal.   Medication Adjustments/Labs and Tests Ordered: Current medicines are reviewed at length with the patient today.  Concerns regarding medicines are outlined above.  Orders Placed This Encounter  Procedures  . EKG 12-Lead   No orders of the defined types were placed in this encounter.    Patient Instructions  Medication Instructions:  Your provider recommends that you continue on your current medications as directed. Please refer to the Current Medication list given to you today.   *If you need a refill on your cardiac medications before your next appointment, please call your pharmacy*   Follow-Up: At [facility], you and your health needs are our priority.  As part of our continuing mission to provide you with exceptional heart care, we have created designated Provider Care Teams.  These Care Teams include your primary Cardiologist (physician) and Advanced Practice Providers (APPs -  Physician Assistants and Nurse Practitioners) who all work together to provide you with the care you need, when you need it. Your next appointment:   12 months The format for your next appointment:   In Person Provider:   You may see Matthew Mocha, MD or one of the following Advanced Practice Providers on your designated Care Team:    Richardson Dopp, PA-C  Vin Ralls, PA-C  Daune Perch, [place]     Signed, Matthew Mocha, MD  05/05/2019 10:45 AM    [place]

## 2019-05-25 ENCOUNTER — Other Ambulatory Visit: Payer: Self-pay

## 2019-06-06 ENCOUNTER — Other Ambulatory Visit: Payer: Self-pay | Admitting: Family Medicine

## 2019-06-06 ENCOUNTER — Encounter: Payer: Self-pay | Admitting: Gastroenterology

## 2019-06-08 ENCOUNTER — Other Ambulatory Visit: Payer: Self-pay

## 2019-06-08 MED ORDER — METOPROLOL SUCCINATE ER 100 MG PO TB24: 100.0000 mg | ORAL_TABLET | Freq: Every day | ORAL | 1 refills | Status: DC

## 2019-06-13 ENCOUNTER — Other Ambulatory Visit: Payer: Self-pay

## 2019-06-13 MED ORDER — METOPROLOL SUCCINATE ER 100 MG PO TB24: 100.0000 mg | ORAL_TABLET | Freq: Every day | ORAL | 1 refills | Status: DC

## 2019-06-28 ENCOUNTER — Other Ambulatory Visit: Payer: Self-pay | Admitting: Family Medicine

## 2019-07-13 ENCOUNTER — Encounter: Payer: Self-pay | Admitting: Gastroenterology

## 2019-07-13 ENCOUNTER — Ambulatory Visit (INDEPENDENT_AMBULATORY_CARE_PROVIDER_SITE_OTHER): Payer: Medicare Other | Admitting: Gastroenterology

## 2019-07-13 VITALS — BP 128/60 | HR 60 | Temp 97.9°F | Ht 72.0 in | Wt 203.0 lb

## 2019-07-13 DIAGNOSIS — Z86010 Personal history of colonic polyps: Secondary | ICD-10-CM | POA: Diagnosis not present

## 2019-07-13 DIAGNOSIS — Z01818 Encounter for other preprocedural examination: Secondary | ICD-10-CM

## 2019-07-13 DIAGNOSIS — K921 Melena: Secondary | ICD-10-CM | POA: Diagnosis not present

## 2019-07-13 DIAGNOSIS — L309 Dermatitis, unspecified: Secondary | ICD-10-CM

## 2019-07-13 MED ORDER — NA SULFATE-K SULFATE-MG SULF 17.5-3.13-1.6 GM/177ML PO SOLN: 1.0000 | Freq: Once | ORAL | 0 refills | Status: AC

## 2019-07-13 NOTE — Progress Notes (Signed)
History of Present Illness: This is a 79 year old male referred by Matthew Olp, MD for the evaluation of rectal bleeding.  For the past 3 months he notes small amounts of bright red blood on the tissue paper when wiping after most bowel movements.  He states it is difficult to clean after bowel movements and he feels that sometimes he has to wipe number of times.  He relates ongoing difficulties with increased intestinal gas and significant flatulence.  He states he has tried Gas-X, Beano, eliminating carbonated beverages and modifying certain foods in his diet without success.  He states he has tried a couple different probiotics also without success.  No other gastrointestinal complaints. Denies weight loss, abdominal pain, constipation, diarrhea, change in stool caliber, melena, nausea, vomiting, dysphagia, reflux symptoms, chest pain.   Colonoscopy 12/2015 - Two 3 to 5 mm polyps in the transverse colon and in the cecum, removed with a cold biopsy forceps. Resected and retrieved. - Mild diverticulosis in the sigmoid colon. Path: tubluar adenoma and benign lymphoid tissue     Allergies  Allergen Reactions  . Penicillins Hives    REACTION: urticaria (hives)  . Tetracycline Hcl     REACTION: urticaria (hives)  . Allopurinol Itching   Outpatient Medications Prior to Visit  Medication Sig Dispense Refill  . aspirin 81 MG tablet Take 81 mg by mouth daily.      Marland Kitchen atorvastatin (LIPITOR) 40 MG tablet Take 1 tablet by mouth once daily 90 tablet 0  . azelastine (ASTELIN) 0.1 % nasal spray USE 1 SPRAY(S) IN EACH NOSTRIL TWICE DAILY AS DIRECTED 30 mL 0  . chlorthalidone (HYGROTON) 25 MG tablet Take 1 tablet by mouth once daily 90 tablet 0  . colchicine (COLCRYS) 0.6 MG tablet Take 1 tablet (0.6 mg total) by mouth daily. 30 tablet 2  . cyclobenzaprine (FLEXERIL) 5 MG tablet TAKE ONE TABLET BY MOUTH TWICE DAILY AS NEEDED FOR MUSCLE SPASM 30 tablet 0  . fexofenadine (ALLEGRA) 180 MG tablet  Take 180 mg by mouth daily.    . fluticasone (FLONASE) 50 MCG/ACT nasal spray USE TWO SPRAYS IN EACH NOSTRIL ONCE DAILY AS NEEDED FOR ALLERGIES OR RHINITIS (NASAL CONGESTION) 16 g 0  . glimepiride (AMARYL) 1 MG tablet TAKE 1 TABLET BY MOUTH ONCE DAILY BEFORE BREAKFAST 90 tablet 0  . glucose 4 GM chewable tablet Chew 1 tablet by mouth once as needed for low blood sugar. Reported on 12/26/2015    . lisinopril (ZESTRIL) 40 MG tablet Take 1 tablet by mouth once daily 90 tablet 0  . metoprolol succinate (TOPROL-XL) 100 MG 24 hr tablet Take 1 tablet (100 mg total) by mouth daily. Take with or immediately following a meal. 90 tablet 1  . NIFEdipine (PROCARDIA-XL) 30 MG (OSM) 24 hr tablet Take 30 mg by mouth 2 (two) times daily.      Marland Kitchen NOVOLIN 70/30 RELION (70-30) 100 UNIT/ML injection INJECT 22 UNITS INTO THE SKIN IN THE MORNING AND 20 UNITS IN THE EVENING 10 mL 0  . pioglitazone (ACTOS) 30 MG tablet Take 1 tablet by mouth once daily 90 tablet 0   No facility-administered medications prior to visit.   Past Medical History:  Diagnosis Date  . CLAUDICATION 05/14/2007  . CORONARY ARTERY DISEASE 12/28/2006  . Diabetes mellitus type II, controlled ([place]) 12/28/2006   Actos 30mg , insulin 70/30 20 units BID, amaryl 1mg  previously a1c <8. Worsened due to cold over 5 weeks around 05/2014 eating poorly and  eating sugary syrups.   Stomach irritation on metformin.  Lab Results  Component Value Date   HGBA1C 8.7* 06/05/2014       . DIABETES MELLITUS, TYPE II 12/28/2006  . Duodenal ulcer   . Erosive gastritis   . GERD (gastroesophageal reflux disease)   . GOUT 12/28/2006  . Hemorrhoids   . Hiatal hernia   . HYPERLIPIDEMIA 12/28/2006  . HYPERTENSION 12/28/2006  . LATERAL EPICONDYLITIS, RIGHT 12/11/2009  . Raynaud's disease   . RENAL FAILURE, CHRONIC 09/15/2008  . Tubular adenoma of colon 11/2010   Past Surgical History:  Procedure Laterality Date  . COLONOSCOPY    . CORONARY ARTERY BYPASS GRAFT    . KNEE  ARTHROSCOPY  2012   Social History   Socioeconomic History  . Marital status: Married    Spouse name: Not on file  . Number of children: 2  . Years of education: Not on file  . Highest education level: Not on file  Occupational History  . Occupation: retired  Tobacco Use  . Smoking status: Former Smoker    Types: Cigarettes    Quit date: 07/09/1968    Years since quitting: 51.0  . Smokeless tobacco: Former Systems developer    Types: Chew    Quit date: 07/09/1988  . Tobacco comment: minimal use x 10 years   Substance and Sexual Activity  . Alcohol use: No    Comment: last was 6 months  . Drug use: No  . Sexual activity: Not on file  Other Topics Concern  . Not on file  Social History Narrative   Married 1983. 2 sons. No grandkids yet.       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       Hobbies: volunteer work, watch sports   Social Determinants of Radio broadcast assistant Strain:   . Difficulty of Paying Living Expenses: Not on file  Food Insecurity:   . Worried About Charity fundraiser in the Last Year: Not on file  . Ran Out of Food in the Last Year: Not on file  Transportation Needs:   . Lack of Transportation (Medical): Not on file  . Lack of Transportation (Non-Medical): Not on file  Physical Activity:   . Days of Exercise per Week: Not on file  . Minutes of Exercise per Session: Not on file  Stress:   . Feeling of Stress : Not on file  Social Connections:   . Frequency of Communication with Friends and Family: Not on file  . Frequency of Social Gatherings with Friends and Family: Not on file  . Attends Religious Services: Not on file  . Active Member of Clubs or Organizations: Not on file  . Attends Archivist Meetings: Not on file  . Marital Status: Not on file   Family History  Problem Relation Age of Onset  . Diabetes Mother   . Hypertension Mother   . Stroke Father   . Colon  cancer Neg Hx       Review of Systems: Pertinent positive and negative review of systems were noted in the above HPI section. All other review of systems were otherwise negative.   Physical Exam: General: Well developed, well nourished, no acute distress Head: Normocephalic and atraumatic Eyes:  sclerae anicteric, EOMI Ears: Normal auditory acuity Mouth: No deformity or lesions Neck: Supple, no masses or thyromegaly Lungs: Clear throughout to auscultation Heart: Regular rate and rhythm; no murmurs,  rubs or bruits Abdomen: Soft, non tender and non distended. No masses, hepatosplenomegaly or hernias noted. Normal Bowel sounds Rectal: perianal dermatitis with white, pink circular area with skin fissure Musculoskeletal: Symmetrical with no gross deformities  Skin: No lesions on visible extremities Pulses:  Normal pulses noted Extremities: No clubbing, cyanosis, edema or deformities noted Neurological: Alert oriented x 4, grossly nonfocal Cervical Nodes:  No significant cervical adenopathy Inguinal Nodes: No significant inguinal adenopathy Psychological:  Alert and cooperative. Normal mood and affect   Assessment and Recommendations:  1. Hematochezia. Personal history of adenomatous colon polyps. Perianal dermatitis which could be the source of blood noted on wiping.  Lotrimin AF twice daily to perianal area for 7 days.  Use baby wipes after bowel movements until symptoms resolve.  Rule out colorectal neoplasms hemorrhoids and other disorders.  Schedule colonoscopy. The risks (including bleeding, perforation, infection, missed lesions, medication reactions and possible hospitalization or surgery if complications occur), benefits, and alternatives to colonoscopy with possible biopsy and possible polypectomy were discussed with the patient and they consent to proceed.   2. Gas, flatulence.  Follow a low gas diet.  Written instructions provided and he will search for potential triggers.   Gas-X 4 times daily as needed.  If above unsuccessful will advise trials of 2 or 3 different probiotics.   cc: Matthew [name], [address] [place],  [place] [address]

## 2019-07-13 NOTE — Patient Instructions (Signed)
Use over the counter Lotrimin AF cream rectally twice daily x 1 week.   Take over the counter Gas-X four times a day for gas and bloating.   You have been scheduled for a colonoscopy. Please follow written instructions given to you at your visit today.  Please pick up your prep supplies at the pharmacy within the next 1-3 days. If you use inhalers (even only as needed), please bring them with you on the day of your procedure.  Normal BMI (Body Mass Index- based on height and weight) is between 23 and 30. Your BMI today is Body mass index is 27.53 kg/m. Marland Kitchen Please consider follow up  regarding your BMI with your Primary Care Provider.  Thank you for choosing me and [facility].  Pricilla Riffle. Dagoberto Ligas., MD., [facility] [place]

## 2019-07-21 ENCOUNTER — Ambulatory Visit (INDEPENDENT_AMBULATORY_CARE_PROVIDER_SITE_OTHER): Payer: Medicare Other

## 2019-07-21 ENCOUNTER — Other Ambulatory Visit: Payer: Self-pay | Admitting: Gastroenterology

## 2019-07-21 DIAGNOSIS — Z1159 Encounter for screening for other viral diseases: Secondary | ICD-10-CM | POA: Diagnosis not present

## 2019-07-22 LAB — SARS CORONAVIRUS 2 (TAT 6-24 HRS): SARS Coronavirus 2: NEGATIVE

## 2019-07-25 ENCOUNTER — Encounter: Payer: Self-pay | Admitting: Gastroenterology

## 2019-07-25 ENCOUNTER — Other Ambulatory Visit: Payer: Self-pay

## 2019-07-25 ENCOUNTER — Ambulatory Visit (AMBULATORY_SURGERY_CENTER): Payer: Medicare Other | Admitting: Gastroenterology

## 2019-07-25 VITALS — BP 140/47 | HR 51 | Temp 97.1°F | Resp 18 | Ht 72.0 in | Wt 203.0 lb

## 2019-07-25 DIAGNOSIS — K5730 Diverticulosis of large intestine without perforation or abscess without bleeding: Secondary | ICD-10-CM | POA: Diagnosis not present

## 2019-07-25 DIAGNOSIS — Z86010 Personal history of colonic polyps: Secondary | ICD-10-CM | POA: Diagnosis not present

## 2019-07-25 DIAGNOSIS — I10 Essential (primary) hypertension: Secondary | ICD-10-CM | POA: Diagnosis not present

## 2019-07-25 DIAGNOSIS — K640 First degree hemorrhoids: Secondary | ICD-10-CM | POA: Diagnosis not present

## 2019-07-25 DIAGNOSIS — I2510 Atherosclerotic heart disease of native coronary artery without angina pectoris: Secondary | ICD-10-CM | POA: Diagnosis not present

## 2019-07-25 DIAGNOSIS — E119 Type 2 diabetes mellitus without complications: Secondary | ICD-10-CM | POA: Diagnosis not present

## 2019-07-25 DIAGNOSIS — K921 Melena: Secondary | ICD-10-CM | POA: Diagnosis not present

## 2019-07-25 MED ORDER — FLEET ENEMA 7-19 GM/118ML RE ENEM
1.0000 | ENEMA | Freq: Once | RECTAL | Status: AC
  Administered 2019-07-25: 1 via RECTAL

## 2019-07-25 MED ORDER — SODIUM CHLORIDE 0.9 % IV SOLN: 500.0000 mL | Freq: Once | INTRAVENOUS | Status: DC

## 2019-07-25 NOTE — Progress Notes (Signed)
Report to PACU, RN, vss, BBS= Clear.  

## 2019-07-25 NOTE — Patient Instructions (Signed)
Handouts given on hemorrhoids and diverticulosis.  You may use Preparation H suppository (OTC) per rectum twice daily as needed for hemorrhoid symptoms.  No repeat colonoscopy due to age.   YOU HAD AN ENDOSCOPIC PROCEDURE TODAY AT [facility]:   Refer to the procedure report that was given to you for any specific questions about what was found during the examination.  If the procedure report does not answer your questions, please call your gastroenterologist to clarify.  If you requested that your care partner not be given the details of your procedure findings, then the procedure report has been included in a sealed envelope for you to review at your convenience later.  YOU SHOULD EXPECT: Some feelings of bloating in the abdomen. Passage of more gas than usual.  Walking can help get rid of the air that was put into your GI tract during the procedure and reduce the bloating. If you had a lower endoscopy (such as a colonoscopy or flexible sigmoidoscopy) you may notice spotting of blood in your stool or on the toilet paper. If you underwent a bowel prep for your procedure, you may not have a normal bowel movement for a few days.  Please Note:  You might notice some irritation and congestion in your nose or some drainage.  This is from the oxygen used during your procedure.  There is no need for concern and it should clear up in a day or so.  SYMPTOMS TO REPORT IMMEDIATELY:   Following lower endoscopy (colonoscopy or flexible sigmoidoscopy):  Excessive amounts of blood in the stool  Significant tenderness or worsening of abdominal pains  Swelling of the abdomen that is new, acute  Fever of 100F or higher   For urgent or emergent issues, a gastroenterologist can be reached at any hour by calling [phone].   DIET:  We do recommend a small meal at first, but then you may proceed to your regular diet.  Drink plenty of fluids but you should avoid alcoholic beverages for 24  hours.  ACTIVITY:  You should plan to take it easy for the rest of today and you should NOT DRIVE or use heavy machinery until tomorrow (because of the sedation medicines used during the test).    FOLLOW UP: Our staff will call the number listed on your records 48-72 hours following your procedure to check on you and address any questions or concerns that you may have regarding the information given to you following your procedure. If we do not reach you, we will leave a message.  We will attempt to reach you two times.  During this call, we will ask if you have developed any symptoms of COVID 19. If you develop any symptoms (ie: fever, flu-like symptoms, shortness of breath, cough etc.) before then, please call [phone].  If you test positive for Covid 19 in the 2 weeks post procedure, please call and report this information to [place].    If any biopsies were taken you will be contacted by phone or by letter within the next 1-3 weeks.  Please call us at [phone] if you have not heard about the biopsies in 3 weeks.    SIGNATURES/CONFIDENTIALITY: You and/or your care partner have signed paperwork which will be entered into your electronic medical record.  These signatures attest to the fact that that the information above on your After Visit Summary has been reviewed and is understood.  Full responsibility of the confidentiality of this discharge information lies  with you and/or your care-partner.

## 2019-07-25 NOTE — Progress Notes (Signed)
Temp JB  VS CW  Pt's states no medical or surgical changes since previsit or office visit.    Fleets enema results noted as yellowish clear in toilet.

## 2019-07-25 NOTE — Op Note (Signed)
[name] Procedure Date: 07/25/2019 9:37 AM MRN: [id number] Endoscopist: Ladene Artist , MD Age: 79 Referring MD:  Date of Birth: 1941/04/29 Gender: Male Account #: [id number] Procedure:                Colonoscopy Indications:              Hematochezia. Personal history of adenomatous colon                            polyps. Medicines:                Monitored Anesthesia Care Procedure:                Pre-Anesthesia Assessment:                           - Prior to the procedure, a History and Physical                            was performed, and patient medications and                            allergies were reviewed. The patient's tolerance of                            previous anesthesia was also reviewed. The risks                            and benefits of the procedure and the sedation                            options and risks were discussed with the patient.                            All questions were answered, and informed consent                            was obtained. Prior Anticoagulants: The patient has                            taken no previous anticoagulant or antiplatelet                            agents. ASA Grade Assessment: III - A patient with                            severe systemic disease. After reviewing the risks                            and benefits, the patient was deemed in                            satisfactory condition to undergo the procedure.  After obtaining informed consent, the colonoscope                            was passed under direct vision. Throughout the                            procedure, the patient's blood pressure, pulse, and                            oxygen saturations were monitored continuously. The                            Colonoscope was introduced through the anus and                            advanced to the the cecum, identified by                     appendiceal orifice and ileocecal valve. The                            ileocecal valve, appendiceal orifice, and rectum                            were photographed. The quality of the bowel                            preparation was good. The colonoscopy was performed                            without difficulty. The patient tolerated the                            procedure well. Scope In: 9:51:03 AM Scope Out: 10:05:07 AM Scope Withdrawal Time: 0 hours 12 minutes 9 seconds  Total Procedure Duration: 0 hours 14 minutes 4 seconds  Findings:                 The perianal and digital rectal examinations were                            normal.                           A few medium-mouthed diverticula were found in the                            ascending colon. There was no evidence of                            diverticular bleeding.                           Internal hemorrhoids were found during                            retroflexion.  The hemorrhoids were small and Grade                            I (internal hemorrhoids that do not prolapse).                           The exam was otherwise without abnormality on                            direct and retroflexion views. Complications:            No immediate complications. Estimated blood loss:                            None. Estimated Blood Loss:     Estimated blood loss: none. Impression:               - Mild diverticulosis in the ascending colon.                           - Internal hemorrhoids.                           - The examination was otherwise normal on direct                            and retroflexion views.                           - No specimens collected. Recommendation:           - Patient has a contact number available for                            emergencies. The signs and symptoms of potential                            delayed complications were discussed with the                             patient. Return to normal activities tomorrow.                            Written discharge instructions were provided to the                            patient.                           - Resume previous diet.                           - Continue present medications.                           - Preparation H supp (OTC) PR bid prn hemorrhoid  symptoms.                           - No repeat colonoscopy due to age. Ladene Artist, MD 07/25/2019 10:09:02 AM This report has been signed electronically.

## 2019-07-27 ENCOUNTER — Ambulatory Visit (INDEPENDENT_AMBULATORY_CARE_PROVIDER_SITE_OTHER): Payer: Medicare Other | Admitting: Family Medicine

## 2019-07-27 ENCOUNTER — Other Ambulatory Visit: Payer: Self-pay

## 2019-07-27 ENCOUNTER — Telehealth: Payer: Self-pay | Admitting: *Deleted

## 2019-07-27 ENCOUNTER — Encounter: Payer: Self-pay | Admitting: Family Medicine

## 2019-07-27 ENCOUNTER — Ambulatory Visit: Payer: Medicare Other

## 2019-07-27 VITALS — BP 130/60 | HR 53 | Temp 98.0°F | Ht 72.0 in | Wt 202.0 lb

## 2019-07-27 DIAGNOSIS — I10 Essential (primary) hypertension: Secondary | ICD-10-CM

## 2019-07-27 DIAGNOSIS — E1122 Type 2 diabetes mellitus with diabetic chronic kidney disease: Secondary | ICD-10-CM

## 2019-07-27 DIAGNOSIS — N183 Chronic kidney disease, stage 3 unspecified: Secondary | ICD-10-CM | POA: Diagnosis not present

## 2019-07-27 DIAGNOSIS — Z794 Long term (current) use of insulin: Secondary | ICD-10-CM | POA: Diagnosis not present

## 2019-07-27 DIAGNOSIS — M1A9XX1 Chronic gout, unspecified, with tophus (tophi): Secondary | ICD-10-CM

## 2019-07-27 DIAGNOSIS — I739 Peripheral vascular disease, unspecified: Secondary | ICD-10-CM

## 2019-07-27 DIAGNOSIS — Z0001 Encounter for general adult medical examination with abnormal findings: Secondary | ICD-10-CM

## 2019-07-27 DIAGNOSIS — R202 Paresthesia of skin: Secondary | ICD-10-CM | POA: Diagnosis not present

## 2019-07-27 DIAGNOSIS — I2510 Atherosclerotic heart disease of native coronary artery without angina pectoris: Secondary | ICD-10-CM

## 2019-07-27 DIAGNOSIS — E785 Hyperlipidemia, unspecified: Secondary | ICD-10-CM

## 2019-07-27 DIAGNOSIS — E1159 Type 2 diabetes mellitus with other circulatory complications: Secondary | ICD-10-CM | POA: Diagnosis not present

## 2019-07-27 DIAGNOSIS — Z0000 Encounter for general adult medical examination without abnormal findings: Secondary | ICD-10-CM

## 2019-07-27 DIAGNOSIS — E1169 Type 2 diabetes mellitus with other specified complication: Secondary | ICD-10-CM

## 2019-07-27 LAB — COMPREHENSIVE METABOLIC PANEL
ALT: 10 U/L (ref 0–53)
AST: 16 U/L (ref 0–37)
Albumin: 4.1 g/dL (ref 3.5–5.2)
Alkaline Phosphatase: 55 U/L (ref 39–117)
BUN: 17 mg/dL (ref 6–23)
CO2: 29 mEq/L (ref 19–32)
Calcium: 9.7 mg/dL (ref 8.4–10.5)
Chloride: 105 mEq/L (ref 96–112)
Creatinine, Ser: 1.35 mg/dL (ref 0.40–1.50)
GFR: 62.03 mL/min (ref >60.00)
Glucose, Bld: 68 mg/dL — ABNORMAL LOW (ref 70–99)
Potassium: 4.1 mEq/L (ref 3.5–5.1)
Sodium: 140 mEq/L (ref 135–145)
Total Bilirubin: 0.5 mg/dL (ref 0.2–1.2)
Total Protein: 8 g/dL (ref 6.0–8.3)

## 2019-07-27 LAB — LDL CHOLESTEROL, DIRECT: Direct LDL: 68 mg/dL

## 2019-07-27 LAB — VITAMIN B12: Vitamin B-12: 276 pg/mL (ref 211–911)

## 2019-07-27 LAB — HEMOGLOBIN A1C: Hgb A1c MFr Bld: 8.7 % — ABNORMAL HIGH (ref 4.6–6.5)

## 2019-07-27 NOTE — Telephone Encounter (Signed)
  Follow up Call-  Call back number 07/25/2019  Post procedure Call Back phone  # [phone]  Permission to leave phone message Yes  Some recent data might be hidden     Patient questions:  Do you have a fever, pain , or abdominal swelling? No. Pain Score  0 *  Have you tolerated food without any problems? Yes.    Have you been able to return to your normal activities? Yes.    Do you have any questions about your discharge instructions: Diet   No. Medications  No. Follow up visit  No.  Do you have questions or concerns about your Care? No.  Actions: * If pain score is 4 or above: No action needed, pain <4.  1. Have you developed a fever since your procedure? no  2.   Have you had an respiratory symptoms (SOB or cough) since your procedure? no  3.   Have you tested positive for COVID 19 since your procedure no  4.   Have you had any family members/close contacts diagnosed with the COVID 19 since your procedure?  no   If yes to any of these questions please route to Joylene John, RN and Alphonsa Gin, Therapist, sports.

## 2019-07-27 NOTE — Progress Notes (Signed)
Phone [phone] In person visit   Subjective:   Matthew [name] is a 79 y.o. year old very pleasant male patient who presents for/with See problem oriented charting Chief Complaint  Patient presents with  . Follow-up  . Diabetes  . Hypertension   This visit occurred during the SARS-CoV-2 public health emergency.  Safety protocols were in place, including screening questions prior to the visit, additional usage of staff PPE, and extensive cleaning of exam room while observing appropriate contact time as indicated for disinfecting solutions.   Past Medical History-  Patient Active Problem List   Diagnosis Date Noted  . PAD (peripheral artery disease) (HCC)-with claudication 05/14/2007    Priority: High  . Diabetes mellitus with renal complications ([place]) [id number]    Priority: High  .  Coronary artery disease s/p CABG 1997 12/28/2006    Priority: High  . Cervical arthritis ([place]) 10/25/2014    Priority: Medium  . Erectile dysfunction 08/13/2010    Priority: Medium  . CKD (chronic kidney disease), stage III 09/15/2008    Priority: Medium  . Hyperlipidemia associated with type 2 diabetes mellitus ([place]) 12/28/2006    Priority: Medium  . 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 Hypertension associated with diabetes ([place]) 12/28/2006    Priority: Medium  . Allergic rhinitis 11/18/2016    Priority: Low  . Pulmonary nodule seen on imaging study 05/17/2012    Priority: Low    Medications- reviewed and updated Current Outpatient Medications  Medication Sig Dispense Refill  . aspirin 81 MG tablet Take 81 mg by mouth daily.      Marland Kitchen atorvastatin (LIPITOR) 40 MG tablet Take 1 tablet by mouth once daily 90 tablet 0  . azelastine (ASTELIN) 0.1 % nasal spray USE 1 SPRAY(S) IN EACH NOSTRIL TWICE DAILY AS DIRECTED 30 mL 0  . chlorthalidone (HYGROTON) 25 MG tablet Take 1 tablet by mouth once daily 90 tablet 0  . colchicine (COLCRYS) 0.6 MG tablet Take 1 tablet (0.6 mg total) by  mouth daily. 30 tablet 2  . cyclobenzaprine (FLEXERIL) 5 MG tablet TAKE ONE TABLET BY MOUTH TWICE DAILY AS NEEDED FOR MUSCLE SPASM 30 tablet 0  . fexofenadine (ALLEGRA) 180 MG tablet Take 180 mg by mouth daily.    . fluticasone (FLONASE) 50 MCG/ACT nasal spray USE TWO SPRAYS IN EACH NOSTRIL ONCE DAILY AS NEEDED FOR ALLERGIES OR RHINITIS (NASAL CONGESTION) 16 g 0  . glimepiride (AMARYL) 1 MG tablet TAKE 1 TABLET BY MOUTH ONCE DAILY BEFORE BREAKFAST 90 tablet 0  . glucose 4 GM chewable tablet Chew 1 tablet by mouth once as needed for low blood sugar. Reported on 12/26/2015    . lisinopril (ZESTRIL) 40 MG tablet Take 1 tablet by mouth once daily 90 tablet 0  . metoprolol succinate (TOPROL-XL) 100 MG 24 hr tablet Take 1 tablet (100 mg total) by mouth daily. Take with or immediately following a meal. 90 tablet 1  . NIFEdipine (PROCARDIA-XL) 30 MG (OSM) 24 hr tablet Take 30 mg by mouth 2 (two) times daily.      Marland Kitchen NOVOLIN 70/30 RELION (70-30) 100 UNIT/ML injection INJECT 22 UNITS INTO THE SKIN IN THE MORNING AND 20 UNITS IN THE EVENING 10 mL 0  . pioglitazone (ACTOS) 30 MG tablet Take 1 tablet by mouth once daily 90 tablet 0   No current facility-administered medications for this visit.     Objective:  BP 130/60   Pulse (!) 53   Temp 98 F (  36.7 C)   Ht 6' (1.829 m)   Wt 202 lb (91.6 kg)   SpO2 98%   BMI 27.40 kg/m  Gen: NAD, resting comfortably CV: Regular rate but bradycardic no murmurs rubs or gallops Lungs: CTAB no crackles, wheeze, rhonchi Ext: no edema Skin: warm, dry  Diabetic Foot Exam - Simple   Simple Foot Form Diabetic Foot exam was performed with the following findings: Yes 07/27/2019 11:15 AM  Visual Inspection No deformities, no ulcerations, no other skin breakdown bilaterally: Yes Sensation Testing Intact to touch and monofilament testing bilaterally: Yes Pulse Check Posterior Tibialis and Dorsalis pulse intact bilaterally: Yes, weak PT pulses bilaterally, normal 2+ DP  pulses       Assessment and Plan   # HM- on wait list for covid- his visit got cancelled.   #hypertension/CKD stage III S: compliant with chlorthalidone 25mg . Toprol XL 100mg  and procardia 30mg .  Reports good control on home numbers typically 130/80 or so   Creatinine typically ranges in 1.5-1.7 range.  Follows with [facility]. No nsaids BP Readings from Last 3 Encounters:  07/27/19 130/60  07/25/19 (!) 140/47  07/13/19 128/60  A/P:  Stable CKD and blood pressure. Continue current medications.  Update CMP today  # Diabetes S: Compliant with glimipride 1mg . novolin 70-30 with 22 units AM and 20 in PM (he does some slight adjustments based on sugar levels) , actos 30MG  CBGs- typically in 90s.  If lower than 90s may hold insulin or reduce it Exercise and diet- going well Lab Results  Component Value Date   HGBA1C 7.5 02/22/2019   HGBA1C 7.1 (A) 07/14/2018   HGBA1C 6.8 03/19/2017   A/P: I think goal 7.5 or 8 is reasonable for a1c for him though more ideal under 7- update a1c - likely continue current rx as long as under 8  #hyperlipidemia/PAD/CAD S: compliant with atorvastatin 40 mg.  He is also compliant with aspirin   No chest pain or shortness of breath in regards to CAD.  Stable claudication for peripheral arterial disease.- pain in calves with walking if walking at least 2 blocks Lab Results  Component Value Date   CHOL 139 02/22/2019   HDL 50 02/22/2019   LDLCALC 77 02/22/2019   LDLDIRECT 79.0 07/19/2015   TRIG 60 02/22/2019   CHOLHDL 3 07/14/2018   A/P: We discussed could be more aggressive with LDL goal to push for under 70- will check direct LDL. If ldl under 70- continue rx, if above 70 - change to rosuvastatin 40mg   -Week posterior tibialis pulses noted-normal 2+ dorsalis pedis pulses  #Gout S: Using colchicine every other day as prevention- can bump dose if has issues.  Intense itching in scalp with allopurinol.  Uloric not a good option with CAD history.   Lab Results  Component Value Date   LABURIC 7.9 (H) 07/14/2018  A/P: uric acid is high but allopurinol caused scalp itching intense and uloric not good for heart disease- will continue colchicine every other day- increase dose to daily if having issues   Tingling/wrist pain S:Pt complain of off and on tingling in his left palm like a shock wave and can go up the forearm-   For 7-8 weeks. Last just a few seconds. Happens sporadically twice a week A/P: for left wrist/hand pain- could possibly be carpal tunnel- try cock up wrist splint at night to see if that helps. Can also ice it when it bothers.  If not improving- we could have you see our  sports medicine doctor- please let us know  Recommended follow up: Return in about 4 months (around 11/24/2019) for follow up- or sooner if needed. Future Appointments  Date Time Provider [place]  11/25/2019 11:00 AM Marin Olp, MD [facility]    Lab/Order associations:   ICD-10-CM   2. Type 2 diabetes mellitus with stage 3 chronic kidney disease, with long-term current use of insulin, unspecified whether stage 3a or 3b CKD (HCC)  E11.22 Comprehensive metabolic panel   [id number] LDL cholesterol, direct   Z79.4 Hemoglobin A1c  3. Paresthesias  R20.2 Vitamin B12  4. Hypertension associated with diabetes ([place])  E11.59    I10   5. Hyperlipidemia associated with type 2 diabetes mellitus (HCC)  E11.69    E78.5   6. PAD (peripheral artery disease) (HCC)-with claudication  I73.9   7. Coronary artery disease involving native coronary artery of native heart without angina pectoris  I25.10   8. Gout with tophi  [id number].9XX1   9. Stage 3 chronic kidney disease, unspecified whether stage 3a or 3b CKD  N18.30    Return precautions advised.  Garret Reddish, MD

## 2019-07-27 NOTE — Patient Instructions (Addendum)
Health Maintenance Due  Topic Date Due  . TETANUS/TDAP -declines 03/03/2018  . FOOT EXAM -today 07/15/2019   for left wrist/hand pain- could possibly be carpal tunnel- try cock up wrist splint at night to see if that helps. Can also ice it when it bothers.  If not improving- we could have you see our sports medicine doctor- please let us know  Please stop by lab before you go If you do not have mychart- we will call you about results within 5 business days of [place] receiving them.  If you have mychart- we will send your results within 3 business days of [place] receiving them.  If abnormal or we want to clarify a result, we will call or mychart you to make sure you receive the message.  If you have questions or concerns or don't hear within 5-7 days, please send [place] a message or call us.   4 month regular follow up recommended  For wellness visit- can repeat in a year Mr. Aloisi , Thank you for taking time to come for your Medicare Wellness Visit. I appreciate your ongoing commitment to your health goals. Please review the following plan we discussed and let me know if I can assist you in the future.   These are the goals we discussed: 1. Maintain current weight 2. Continue exercise at least 5 days a week   This is a list of the screening recommended for you and due dates:  Health Maintenance  Topic Date Due  . Hemoglobin A1C  08/25/2019  . Eye exam for diabetics  01/03/2020  . Complete foot exam   07/26/2020  . Flu Shot  Completed  . Pneumonia vaccines  Completed  . Tetanus Vaccine  Discontinued

## 2019-07-27 NOTE — Progress Notes (Signed)
Phone: [phone]    Subjective:  Patient presents today for their annual wellness visit (subsequent)  Preventive Screening-Counseling & Management  Modifiable Risk Factors/behavioral risk assessment/psychosocial risk assessment Regular exercise: doing floor exercises on regular basis- push ups and crunches for core strengthening Diet: low carb diet- trying to eat healthy  Wt Readings from Last 3 Encounters:  07/27/19 202 lb (91.6 kg)  07/25/19 203 lb (92.1 kg)  07/13/19 203 lb (92.1 kg)   Smoking Status: former Smoker- quit in 1970s- no regular screening needed Second Hand Smoking status: No smokers in home Alcohol intake: 0-1 per week  Cardiac risk factors:  Known CAD advanced age (older than 73 for men, 45 for women)  treated Hyperlipidemia  treated Hypertension   treated diabetes.  Lab Results  Component Value Date   HGBA1C 7.5 02/22/2019  Family History: stroke in father    Depression Screen/risk evaluation Risk factors: no history of depression.Marland Kitchen PHQ2 0 today-did not reenter into system  Functional ability and level of safety Mobility assessment:  timed get up and go <12 seconds Activities of Daily Living- Independent in ADLs (toileting, bathing, dressing, transferring, eating) and in IADLs (shopping, housekeeping, managing own medications, and handling finances) Home Safety: Loose rugs (no), smoke detectors (in place), small pets (no), grab bars (In place), stairs (does not have in stairs- rarely goes to attic), life-alert system (uses his phone) Hearing Difficulties: -patient declines new issues- 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 Would have to have cochlear implant- no issues on right Fall Risk: None , no falls in last year- did not reinput fall screen in direct field Opioid use history:  no long term opioids use Self assessment of health status: "good"  Cognitive Testing          No reported trouble.   3/3 direct recall. With distraction able to recall 3 words.   No visible changes in memory.   List the Names of Other Physician/Practitioners you currently use: Patient Care Team: Marin Olp, MD as PCP - General (Family Medicine) Sherren Mocha, MD as PCP - Cardiology (Cardiology) -[facility] [facility] -Dr. Katy Fitch optho  Required Immunizations needed today:  Discussed covid 19 shot at pharmacy Immunization History  Administered Date(s) Administered  . Influenza Split 03/30/2012  . Influenza Whole 05/03/2009, 03/30/2010, 03/25/2013  . Influenza, High Dose Seasonal PF 02/13/2015, 04/07/2018, 03/17/2019  . Influenza,inj,Quad PF,6+ Mos 04/12/2014  . Influenza-Unspecified 02/29/2016, 03/02/2017, 03/17/2019  . Pneumococcal Conjugate-13 10/04/2014  . Pneumococcal Polysaccharide-23 03/03/2008  . Td 03/03/2008  . Zoster 08/13/2010  . Zoster Recombinat (Shingrix) 01/23/2018, 04/07/2018   Health Maintenance  Topic Date Due  . Hemoglobin A1C  08/25/2019  . Eye exam for diabetics  01/03/2020  . Complete foot exam   07/26/2020  . Flu Shot  Completed  . Pneumonia vaccines  Completed  . Tetanus Vaccine  Discontinued    Screening tests-   1. Colon cancer screening- 07/25/2019 with no more colonoscopy per Dr. Fuller Plan 2. Lung Cancer screening- not a candidate 3. Skin cancer screening- low risk due to melanin content 4. Prostate cancer screening- no longer screening based off age based recommendations Lab Results  Component Value Date   PSA 1.06 06/05/2014   PSA done 07/01/2003   The following were reviewed and entered/updated in epic if appropriate: Past Medical History:  Diagnosis Date  . CLAUDICATION 05/14/2007  . CORONARY ARTERY DISEASE 12/28/2006  .  Diabetes mellitus type II, controlled ([place]) 12/28/2006   Actos 30mg , insulin 70/30 20 units BID, amaryl 1mg  previously a1c <8. Worsened due to cold over 5 weeks around 05/2014 eating poorly and  eating sugary syrups.   Stomach irritation on metformin.  Lab Results  Component Value Date   HGBA1C 8.7* 06/05/2014       . DIABETES MELLITUS, TYPE II 12/28/2006  . Duodenal ulcer   . Erosive gastritis   . GERD (gastroesophageal reflux disease)   . GOUT 12/28/2006  . Hemorrhoids   . Hiatal hernia   . HYPERLIPIDEMIA 12/28/2006  . HYPERTENSION 12/28/2006  . LATERAL EPICONDYLITIS, RIGHT 12/11/2009  . Myocardial infarction ([place])    23 yrs ago per pt  . Raynaud's disease   . RENAL FAILURE, CHRONIC 09/15/2008  . Tubular adenoma of colon 11/2010   Patient Active Problem List   Diagnosis Date Noted  . PAD (peripheral artery disease) (HCC)-with claudication 05/14/2007    Priority: High  . Diabetes mellitus with renal complications ([place]) [id number]    Priority: High  .  Coronary artery disease s/p CABG 1997 12/28/2006    Priority: High  . Cervical arthritis ([place]) 10/25/2014    Priority: Medium  . Erectile dysfunction 08/13/2010    Priority: Medium  . CKD (chronic kidney disease), stage III 09/15/2008    Priority: Medium  . Hyperlipidemia associated with type 2 diabetes mellitus ([place]) 12/28/2006    Priority: Medium  . 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 Hypertension associated with diabetes ([place]) 12/28/2006    Priority: Medium  . Allergic rhinitis 11/18/2016    Priority: Low  . Pulmonary nodule seen on imaging study 05/17/2012    Priority: Low   Past Surgical History:  Procedure Laterality Date  . COLONOSCOPY    . CORONARY ARTERY BYPASS GRAFT    . KNEE ARTHROSCOPY  2012  . UPPER GASTROINTESTINAL ENDOSCOPY      Family History  Problem Relation Age of Onset  . Diabetes Mother   . Hypertension Mother   . Stroke Father   . Colon cancer Neg Hx   . Esophageal cancer Neg Hx   . Rectal cancer Neg Hx   . Stomach cancer Neg Hx     Medications- reviewed and updated Current Outpatient Medications  Medication Sig Dispense Refill  . aspirin 81 MG tablet Take 81 mg by  mouth daily.      Marland Kitchen atorvastatin (LIPITOR) 40 MG tablet Take 1 tablet by mouth once daily 90 tablet 0  . azelastine (ASTELIN) 0.1 % nasal spray USE 1 SPRAY(S) IN EACH NOSTRIL TWICE DAILY AS DIRECTED 30 mL 0  . chlorthalidone (HYGROTON) 25 MG tablet Take 1 tablet by mouth once daily 90 tablet 0  . colchicine (COLCRYS) 0.6 MG tablet Take 1 tablet (0.6 mg total) by mouth daily. 30 tablet 2  . cyclobenzaprine (FLEXERIL) 5 MG tablet TAKE ONE TABLET BY MOUTH TWICE DAILY AS NEEDED FOR MUSCLE SPASM 30 tablet 0  . fexofenadine (ALLEGRA) 180 MG tablet Take 180 mg by mouth daily.    . fluticasone (FLONASE) 50 MCG/ACT nasal spray USE TWO SPRAYS IN EACH NOSTRIL ONCE DAILY AS NEEDED FOR ALLERGIES OR RHINITIS (NASAL CONGESTION) 16 g 0  . glimepiride (AMARYL) 1 MG tablet TAKE 1 TABLET BY MOUTH ONCE DAILY BEFORE BREAKFAST 90 tablet 0  . glucose 4 GM chewable tablet Chew 1 tablet by mouth once as needed for low blood sugar. Reported on 12/26/2015    . lisinopril (  ZESTRIL) 40 MG tablet Take 1 tablet by mouth once daily 90 tablet 0  . metoprolol succinate (TOPROL-XL) 100 MG 24 hr tablet Take 1 tablet (100 mg total) by mouth daily. Take with or immediately following a meal. 90 tablet 1  . NIFEdipine (PROCARDIA-XL) 30 MG (OSM) 24 hr tablet Take 30 mg by mouth 2 (two) times daily.      Marland Kitchen NOVOLIN 70/30 RELION (70-30) 100 UNIT/ML injection INJECT 22 UNITS INTO THE SKIN IN THE MORNING AND 20 UNITS IN THE EVENING 10 mL 0  . pioglitazone (ACTOS) 30 MG tablet Take 1 tablet by mouth once daily 90 tablet 0   No current facility-administered medications for this visit.    Allergies-reviewed and updated Allergies  Allergen Reactions  . Penicillins Hives    REACTION: urticaria (hives)  . Tetracycline Hcl     REACTION: urticaria (hives)  . Allopurinol Itching    Social History   Socioeconomic History  . Marital status: Married    Spouse name: Not on file  . Number of children: 2  . Years of education: Not on file  .  Highest education level: Not on file  Occupational History  . Occupation: retired  Tobacco Use  . Smoking status: Former Smoker    Types: Cigarettes    Quit date: 07/09/1968    Years since quitting: 51.0  . Smokeless tobacco: Former Systems developer    Types: Chew    Quit date: 07/09/1988  . Tobacco comment: minimal use x 10 years   Substance and Sexual Activity  . Alcohol use: No    Comment: last was 6 months  . Drug use: No  . Sexual activity: Not on file  Other Topics Concern  . Not on file  Social History Narrative   Married 1983. 2 sons. No grandkids yet.       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       Hobbies: volunteer work, watch sports   Social Determinants of Radio broadcast assistant Strain:   . Difficulty of Paying Living Expenses: Not on file  Food Insecurity:   . Worried About Charity fundraiser in the Last Year: Not on file  . Ran Out of Food in the Last Year: Not on file  Transportation Needs:   . Lack of Transportation (Medical): Not on file  . Lack of Transportation (Non-Medical): Not on file  Physical Activity:   . Days of Exercise per Week: Not on file  . Minutes of Exercise per Session: Not on file  Stress:   . Feeling of Stress : Not on file  Social Connections:   . Frequency of Communication with Friends and Family: Not on file  . Frequency of Social Gatherings with Friends and Family: Not on file  . Attends Religious Services: Not on file  . Active Member of Clubs or Organizations: Not on file  . Attends Archivist Meetings: Not on file  . Marital Status: Not on file      Objective:  BP 130/60   Pulse (!) 53   Temp 98 F (36.7 C)   Ht 6' (1.829 m)   Wt 202 lb (91.6 kg)   SpO2 98%   BMI 27.40 kg/m     Assessment/Plan:  AWV completed 1. Educated, counseled and referred based on above elements 2. Educated, counseled and referred as appropriate for preventative  needs 3. Discussed and documented a written plan for  preventiative services and screenings with personalized health advice- After Visit Summary was given to patient which included this plan   Status of chronic or acute concerns  See separate visit   Recommended follow up: Repeat annual wellness visit in 1 year Future Appointments  Date Time Provider [place]  11/25/2019 11:00 AM Marin Olp, MD [facility]     Lab/Order associations:   ICD-10-CM   1. Preventative health care  Z00.00    Return precautions advised. Garret Reddish, MD

## 2019-07-28 ENCOUNTER — Other Ambulatory Visit: Payer: Self-pay

## 2019-07-28 ENCOUNTER — Encounter: Payer: Medicare Other | Admitting: Gastroenterology

## 2019-07-28 DIAGNOSIS — Z794 Long term (current) use of insulin: Secondary | ICD-10-CM

## 2019-08-05 ENCOUNTER — Ambulatory Visit: Payer: Medicare Other | Attending: Internal Medicine

## 2019-08-05 ENCOUNTER — Ambulatory Visit: Payer: Medicare Other | Admitting: Cardiovascular Disease

## 2019-08-05 DIAGNOSIS — Z23 Encounter for immunization: Secondary | ICD-10-CM | POA: Insufficient documentation

## 2019-08-05 NOTE — Progress Notes (Signed)
   Covid-19 Vaccination Clinic  Name:  Matthew [name]    MRN: [id number] DOB: 30-Nov-1940  08/05/2019  [name] was observed post Covid-19 immunization for 30 minutes based on pre-vaccination screening without incidence. He was provided with Vaccine Information Sheet and instruction to access the V-Safe system.   [name] was instructed to call 911 with any severe reactions post vaccine: Marland Kitchen Difficulty breathing  . Swelling of your face and throat  . A fast heartbeat  . A bad rash all over your body  . Dizziness and weakness    Immunizations Administered    Name Date Dose VIS Date Route   Pfizer COVID-19 Vaccine 08/05/2019 11:14 AM 0.3 mL 06/10/2019 Intramuscular   Manufacturer: [place]   Lot: [id number]   [place]: [id number]

## 2019-08-07 ENCOUNTER — Other Ambulatory Visit: Payer: Self-pay | Admitting: Family Medicine

## 2019-08-08 ENCOUNTER — Telehealth: Payer: Self-pay | Admitting: Family Medicine

## 2019-08-08 NOTE — Telephone Encounter (Signed)
Yes, that is fine. 

## 2019-08-08 NOTE — Telephone Encounter (Signed)
Patient called in this afternoon saying he wanted to advise Dr.Hunter that he declined the endocrinologist referral because he says that he has had issues with his a1c being high and he knows how to bring his levels down.

## 2019-08-08 NOTE — Telephone Encounter (Signed)
Patient also states he has had a swollen lymph node for a couple of weeks now and doesn't know if it could be drainage from ears or allergies and wanted to get scheduled for an appointment to discuss this further and asked for an appointment this week but the next available is not until 08/23/19, is it okay to schedule on Wednesday @ 10

## 2019-08-10 ENCOUNTER — Other Ambulatory Visit: Payer: Self-pay

## 2019-08-10 ENCOUNTER — Encounter: Payer: Self-pay | Admitting: Family Medicine

## 2019-08-10 ENCOUNTER — Ambulatory Visit (INDEPENDENT_AMBULATORY_CARE_PROVIDER_SITE_OTHER): Payer: Medicare Other | Admitting: Family Medicine

## 2019-08-10 VITALS — BP 134/62 | HR 62 | Temp 98.1°F | Ht 72.0 in | Wt 200.0 lb

## 2019-08-10 DIAGNOSIS — I2510 Atherosclerotic heart disease of native coronary artery without angina pectoris: Secondary | ICD-10-CM

## 2019-08-10 DIAGNOSIS — Z794 Long term (current) use of insulin: Secondary | ICD-10-CM

## 2019-08-10 DIAGNOSIS — B9689 Other specified bacterial agents as the cause of diseases classified elsewhere: Secondary | ICD-10-CM

## 2019-08-10 DIAGNOSIS — H6591 Unspecified nonsuppurative otitis media, right ear: Secondary | ICD-10-CM

## 2019-08-10 DIAGNOSIS — J329 Chronic sinusitis, unspecified: Secondary | ICD-10-CM | POA: Diagnosis not present

## 2019-08-10 DIAGNOSIS — N183 Chronic kidney disease, stage 3 unspecified: Secondary | ICD-10-CM

## 2019-08-10 DIAGNOSIS — E1122 Type 2 diabetes mellitus with diabetic chronic kidney disease: Secondary | ICD-10-CM | POA: Diagnosis not present

## 2019-08-10 DIAGNOSIS — E1159 Type 2 diabetes mellitus with other circulatory complications: Secondary | ICD-10-CM | POA: Diagnosis not present

## 2019-08-10 DIAGNOSIS — I10 Essential (primary) hypertension: Secondary | ICD-10-CM

## 2019-08-10 MED ORDER — AZITHROMYCIN 250 MG PO TABS: ORAL_TABLET | ORAL | 0 refills | Status: DC

## 2019-08-10 NOTE — Patient Instructions (Addendum)
Do not take colchicine while on this antibiotic  Given the fact that you have had sinus congestion and runny nose for nearly a month as well as some sinus pressure I am going to treat you for potential sinus infection with azithromycin.  I usually would use Augmentin or doxycycline but you have allergies to these.  I also wonder if allergies are playing a role-I want you to take Flonase daily for the next 2 weeks.  If you do not improve within 2 weeks or symptoms worsen before then please let me know and we would refer you to the ear nose and throat doctor.  I think the sensation in your neck is from the drainage you are experiencing and hoping that improves with the above treatments (I did not feel an enlarged lymph node today )-if this does not improve also want to refer you to ear nose and throat   Recommended follow up: As needed for acute concern.  Otherwise we have a visit in May to recheck blood sugars

## 2019-08-10 NOTE — Progress Notes (Addendum)
Phone [phone] In person visit   Subjective:   Matthew [name] is a 79 y.o. year old very pleasant male patient who presents for/with See problem oriented charting Chief Complaint  Patient presents with  . Lymphadenopathy    left side under ear and in neck    This visit occurred during the SARS-CoV-2 public health emergency.  Safety protocols were in place, including screening questions prior to the visit, additional usage of staff PPE, and extensive cleaning of exam room while observing appropriate contact time as indicated for disinfecting solutions.   Past Medical History-  Patient Active Problem List   Diagnosis Date Noted  . PAD (peripheral artery disease) (HCC)-with claudication 05/14/2007    Priority: High  . Diabetes mellitus with renal complications ([place]) [id number]    Priority: High  .  Coronary artery disease s/p CABG 1997 12/28/2006    Priority: High  . Cervical arthritis ([place]) 10/25/2014    Priority: Medium  . Erectile dysfunction 08/13/2010    Priority: Medium  . CKD (chronic kidney disease), stage III 09/15/2008    Priority: Medium  . Hyperlipidemia associated with type 2 diabetes mellitus ([place]) 12/28/2006    Priority: Medium  . 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 Hypertension associated with diabetes ([place]) 12/28/2006    Priority: Medium  . Allergic rhinitis 11/18/2016    Priority: Low  . Pulmonary nodule seen on imaging study 05/17/2012    Priority: Low    Medications- reviewed and updated Current Outpatient Medications  Medication Sig Dispense Refill  . aspirin 81 MG tablet Take 81 mg by mouth daily.      Marland Kitchen atorvastatin (LIPITOR) 40 MG tablet Take 1 tablet by mouth once daily 90 tablet 0  . azelastine (ASTELIN) 0.1 % nasal spray USE 1 SPRAY(S) IN EACH NOSTRIL TWICE DAILY AS DIRECTED 30 mL 0  . chlorthalidone (HYGROTON) 25 MG tablet Take 1 tablet by mouth once daily 90 tablet 0  . colchicine (COLCRYS) 0.6 MG tablet Take 1 tablet (0.6  mg total) by mouth daily. 30 tablet 2  . cyclobenzaprine (FLEXERIL) 5 MG tablet TAKE ONE TABLET BY MOUTH TWICE DAILY AS NEEDED FOR MUSCLE SPASM 30 tablet 0  . fexofenadine (ALLEGRA) 180 MG tablet Take 180 mg by mouth daily.    . fluticasone (FLONASE) 50 MCG/ACT nasal spray USE TWO SPRAYS IN EACH NOSTRIL ONCE DAILY AS NEEDED FOR ALLERGIES OR RHINITIS (NASAL CONGESTION) 16 g 0  . glimepiride (AMARYL) 1 MG tablet TAKE 1 TABLET BY MOUTH ONCE DAILY BEFORE BREAKFAST 90 tablet 0  . glucose 4 GM chewable tablet Chew 1 tablet by mouth once as needed for low blood sugar. Reported on 12/26/2015    . lisinopril (ZESTRIL) 40 MG tablet Take 1 tablet by mouth once daily 90 tablet 0  . metoprolol succinate (TOPROL-XL) 100 MG 24 hr tablet Take 1 tablet (100 mg total) by mouth daily. Take with or immediately following a meal. 90 tablet 1  . NIFEdipine (PROCARDIA-XL) 30 MG (OSM) 24 hr tablet Take 30 mg by mouth 2 (two) times daily.      Marland Kitchen NOVOLIN 70/30 RELION (70-30) 100 UNIT/ML injection INJECT 22 UNITS INTO THE SKIN IN THE MORNING AND 20 UNITS IN THE EVENING 10 mL 0  . pioglitazone (ACTOS) 30 MG tablet Take 1 tablet by mouth once daily 90 tablet 0  . azithromycin (ZITHROMAX) 250 MG tablet Take 2 tabs on day 1, then 1 tab daily until finished 6 tablet 0  No current facility-administered medications for this visit.     Objective:  BP 134/62   Pulse 62   Temp 98.1 F (36.7 C) (Temporal)   Ht 6' (1.829 m)   Wt 200 lb (90.7 kg)   SpO2 98%   BMI 27.12 kg/m  Gen: NAD, resting comfortably Tympanic membrane normal for his baseline on the left.  On the right there appears to be amber-colored fluid behind the tympanic membrane and slightly bulging but no erythema. I do not note cervical lymphadenopathy but patient is mildly tender along the right neck.  Minimal sinus pressure CV: RRR no murmurs rubs or gallops Lungs: CTAB no crackles, wheeze, rhonchi Ext: no edema Skin: warm, dry     Assessment and Plan  #  Neck tenderness/muffled hearing/right ear discomfort/feeling of fullness S:  He originally thought it was sinus drainage related. States had been blowing out some green/yellow but not anymore- was about a month ago.  He does continue to have some runny nose and sinus pressure though.  Feels sensitivity along right neck from behind ear down below right neck- feels small lump in his neck. Also has some pain in theright  ear. He refelcts back to last visit and states it may have been there but was very mild at time of our visit on 07/27/2019. He thinks started about 4 weeks ago.Has increased over time particularly after the visit  He has also tried heat and ice with only temporary improvement and warm salt water gargles. He denies any redness. Has some tendrness to touch and with turning head. No fever, nausea, vomiting. Runny nose before eating- light amount of thin discharge.  He started Flonase about a week ago and is not sure if he seen any improvement yet A/P: It appears to me patient has otitis media with effusion.  We will continue Flonase for another 2 weeks and if fails to improve would refer to ENT.  Given he has had ongoing sinus pressure for the last month we also opted to treat with azithromycin due to penicillin and tetracycline allergy.  We recommended strongly to not take colchicine while taking the azithromycin.  I know his blood sugar is high but I would use a short-term course of prednisone if needed to avoid having him take colchicine and azithromycin together  #hypertension S: compliant with chlorthalidone 25 mg, lisinopril 40 mg, metoprolol 100 mg extended release, nifedipine 30 mg extended release.  Please note forgot to list lisinopril with last note BP Readings from Last 3 Encounters:  08/10/19 134/62  07/27/19 130/60  07/25/19 (!) 140/47  A/P: Good control on repeat today-continue current medications  # Diabetes S: Compliant with glimepiride 1 mg, Novolin 70/30 22 units in the  morning and 20 in the p.m as well as Actos 30 mg.  A1c was very high at last visit of patient reports not doing the best with diet and exercise today-feels like he may have overstated how well he was doing last visit Lab Results  Component Value Date   HGBA1C 8.7 (H) 07/27/2019   HGBA1C 7.5 02/22/2019   HGBA1C 7.1 (A) 07/14/2018    A/P: We had referred patient endocrinology last visit but ultimately he declined-wants to buckle down on healthy eating and regular exercise and follow-up with [place] in May for recheck A1c.  Could reconsider referral at that time.  Continue current medications for now.  Has had some issues with lows and I would strongly consider discontinuing glimepiride if that continues at follow-up  Recommended follow up: Already has scheduled follow-up in May-happy to see him sooner for new or worsening issues Future Appointments  Date Time Provider [place]  08/30/2019 12:00 PM [place] [facility]  11/25/2019 11:00 AM Marin Olp, MD [facility]   Lab/Order associations:   ICD-10-CM   1. Right otitis media with effusion  H65.91   2. Bacterial sinusitis  J32.9    B96.89   3. Hypertension associated with diabetes ([place])  E11.59    I10   4. Type 2 diabetes mellitus with stage 3 chronic kidney disease, with long-term current use of insulin, unspecified whether stage 3a or 3b CKD ([place])  E11.22    N18.30    Z79.4     Meds ordered this encounter  Medications  . azithromycin (ZITHROMAX) 250 MG tablet    Sig: Take 2 tabs on day 1, then 1 tab daily until finished    Dispense:  6 tablet    Refill:  0   Return precautions advised.  Garret Reddish, MD

## 2019-08-16 ENCOUNTER — Other Ambulatory Visit: Payer: Self-pay | Admitting: Family Medicine

## 2019-08-17 ENCOUNTER — Ambulatory Visit: Payer: Medicare Other

## 2019-08-30 ENCOUNTER — Ambulatory Visit: Payer: Medicare Other | Attending: Internal Medicine

## 2019-08-30 DIAGNOSIS — Z23 Encounter for immunization: Secondary | ICD-10-CM | POA: Insufficient documentation

## 2019-08-30 NOTE — Progress Notes (Signed)
   Covid-19 Vaccination Clinic  Name:  Matthew [name]    MRN: [id number] DOB: [birth year]  08/30/2019  Mr. Matthew [name] was observed post Covid-19 immunization for 15 minutes without incident. He was provided with Vaccine Information Sheet and instruction to access the V-Safe system.   Mr. Matthew [name] was instructed to call 911 with any severe reactions post vaccine: Marland Kitchen Difficulty breathing  . Swelling of face and throat  . A fast heartbeat  . A bad rash all over body  . Dizziness and weakness   Immunizations Administered    Name Date Dose VIS Date Route   Pfizer COVID-19 Vaccine 08/30/2019 12:09 PM 0.3 mL 06/10/2019 Intramuscular   Manufacturer: [place]   Lot: [id number]   [place]: [id number]

## 2019-09-28 IMAGING — DX DG FINGER INDEX 2+V*R*
3 series · 3 of 3 positions shown · non-contrast
Comparison: Right index finger x-rays dated May 11, 2017.

CLINICAL DATA: Small avulsion fracture recently. Evaluate for joint
erosions.

EXAM:
RIGHT INDEX FINGER 2+V

[finger pa]
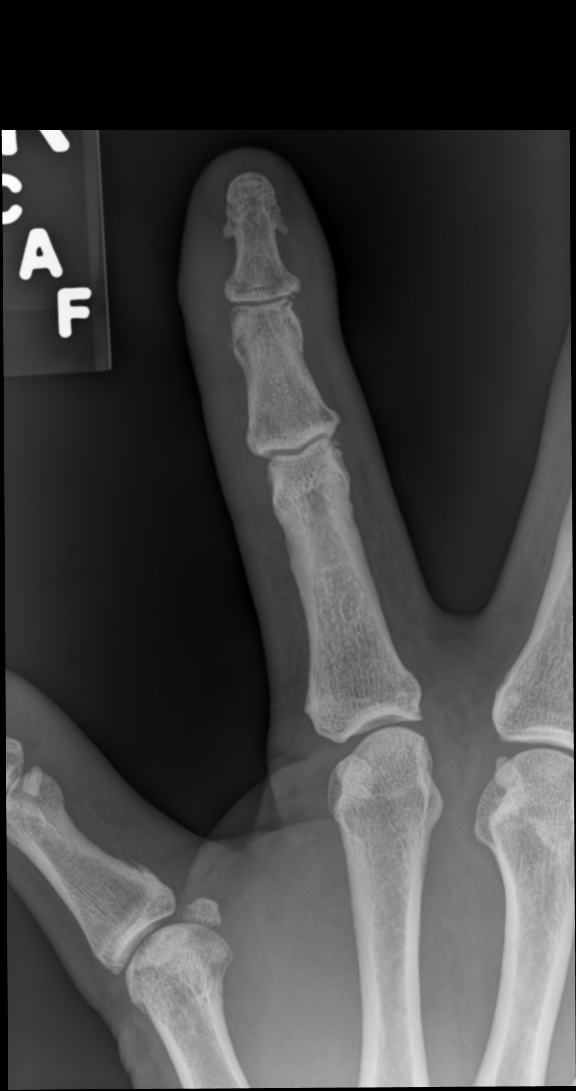

[finger oblique]
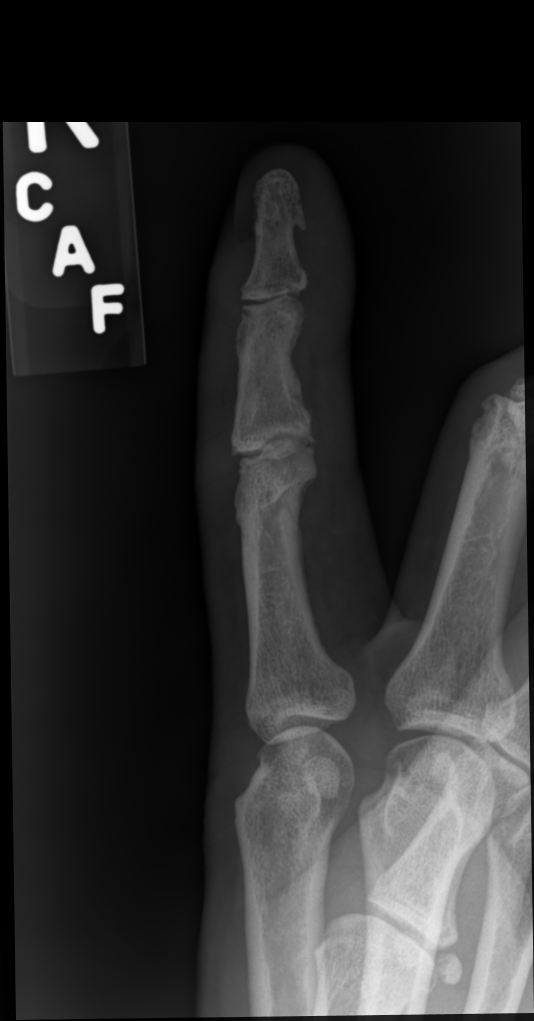

[finger lat]
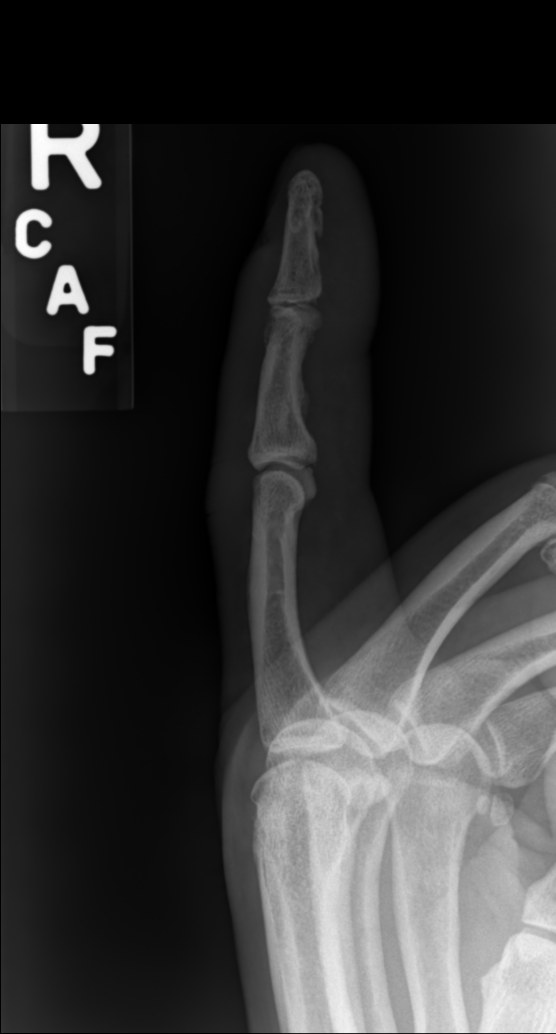

[3 of 3 positions shown; findings below may reference images not displayed]

FINDINGS: No acute fracture or malalignment. No bony erosions or periostitis.
Mild degenerative changes of the DIP joint with surrounding soft
tissue swelling. Bone mineralization is normal.
IMPRESSION: 1. Mild degenerative changes of the index finger DIP joint with
surrounding soft tissue swelling. No fracture, erosions, or cortical
destruction.

## 2019-10-03 ENCOUNTER — Other Ambulatory Visit: Payer: Self-pay | Admitting: Family Medicine

## 2019-10-04 ENCOUNTER — Other Ambulatory Visit: Payer: Self-pay | Admitting: Family Medicine

## 2019-10-24 DIAGNOSIS — M109 Gout, unspecified: Secondary | ICD-10-CM | POA: Diagnosis not present

## 2019-10-24 DIAGNOSIS — N183 Chronic kidney disease, stage 3 unspecified: Secondary | ICD-10-CM | POA: Diagnosis not present

## 2019-10-31 DIAGNOSIS — M109 Gout, unspecified: Secondary | ICD-10-CM | POA: Diagnosis not present

## 2019-10-31 DIAGNOSIS — I129 Hypertensive chronic kidney disease with stage 1 through stage 4 chronic kidney disease, or unspecified chronic kidney disease: Secondary | ICD-10-CM | POA: Diagnosis not present

## 2019-10-31 DIAGNOSIS — N183 Chronic kidney disease, stage 3 unspecified: Secondary | ICD-10-CM | POA: Diagnosis not present

## 2019-10-31 DIAGNOSIS — R609 Edema, unspecified: Secondary | ICD-10-CM | POA: Diagnosis not present

## 2019-10-31 DIAGNOSIS — E559 Vitamin D deficiency, unspecified: Secondary | ICD-10-CM | POA: Diagnosis not present

## 2019-11-02 ENCOUNTER — Telehealth: Payer: Self-pay

## 2019-11-02 NOTE — Telephone Encounter (Signed)
Patient would like to schedule appt for foot pain. Can I schedule patient in [facility] ?

## 2019-11-03 NOTE — Telephone Encounter (Signed)
Has app with sam tomorrow

## 2019-11-03 NOTE — Telephone Encounter (Signed)
Chief Complaint Foot Pain Reason for Call Symptomatic / Request for Health Information Initial Comment Caller states he is experiencing foot pain. Translation No Nurse Assessment Nurse: Raphael Gibney, RN, Vanita Ingles Date/Time (Eastern Time): 11/03/2019 11:35:41 AM Confirm and document reason for call. If symptomatic, describe symptoms. ---Caller states he has appt tomorrow with Dr. Yong Channel. history of gout. has had pain for 2.5 weeks. Made a misstep 2-3 weeks ago but he continued to work. has been taking colchicine. has left great toe pain and left ankle pain. left great toe and ankle are swollen. Has the patient had close contact with a person known or suspected to have the novel coronavirus illness OR traveled / lives in area with major community spread (including international travel) in the last 14 days from the onset of symptoms? * If Asymptomatic, screen for exposure and travel within the last 14 days. ---No Does the patient have any new or worsening symptoms? ---Yes Will a triage be completed? ---Yes Related visit to physician within the last 2 weeks? ---No Does the PT have any chronic conditions? (i.e. diabetes, asthma, this includes High risk factors for pregnancy, etc.) ---Yes List chronic conditions. ---gout; diabetes Is this a behavioral health or substance abuse call? ---No Guidelines Guideline Title Affirmed Question Affirmed Notes Nurse Date/Time Eilene Ghazi Time) Diabetes - Foot Problems and Questions Severe pain Raphael Gibney, RN, Vanita Ingles 11/03/2019 11:42:28 AM Disp. Time (Eastern Time) Disposition Final UserPLEASE NOTE: All timestamps contained within this report are represented as [place] Standard Time. CONFIDENTIALTY NOTICE: This fax transmission is intended only for the addressee. It contains information that is legally privileged, confidential or otherwise protected from use or disclosure. If you are not the intended recipient, you are strictly prohibited from reviewing, disclosing,  copying using or disseminating any of this information or taking any action in reliance on or regarding this information. If you have received this fax in error, please notify us immediately by telephone so that we can arrange for its return to [place]. Phone: [phone], Toll-Free: [phone], Fax: [phone] Page: 2 of 2 Call Id: [id number] 11/03/2019 11:45:22 AM Go to ED Now Yes Raphael Gibney, RN, Doreatha Lew Disagree/Comply Disagree Caller Understands Yes PreDisposition Call Doctor Care Advice Given Per Guideline GO TO ED NOW: * You need to be seen in the Emergency Department. NOTE TO TRIAGER - DRIVING: * Another adult should drive. Comments User: Dannielle Burn, RN Date/Time Eilene Ghazi Time): 11/03/2019 11:49:26 AM he is walking with crutches. User: Dannielle Burn, RN Date/Time Eilene Ghazi Time): 11/03/2019 11:51:35 AM Called back line and spoke to [place]. Pt is diabetic and having severe left ankle and left great toe pain. Has swelling in left great toe and ankle. Thought it was gout and has been taking colchicine. he is ambulating with crutches. Triage outcome of go to ER now but he has appt tomorrow and is going to keep that and not go to the ER Referrals [place]

## 2019-11-03 NOTE — Telephone Encounter (Signed)
Yes thanks 

## 2019-11-03 NOTE — Telephone Encounter (Signed)
Please advice  

## 2019-11-04 ENCOUNTER — Encounter: Payer: Self-pay | Admitting: Family Medicine

## 2019-11-04 ENCOUNTER — Ambulatory Visit (INDEPENDENT_AMBULATORY_CARE_PROVIDER_SITE_OTHER): Payer: Medicare Other | Admitting: Family Medicine

## 2019-11-04 ENCOUNTER — Ambulatory Visit: Payer: Medicare Other | Admitting: Physician Assistant

## 2019-11-04 ENCOUNTER — Other Ambulatory Visit: Payer: Self-pay

## 2019-11-04 VITALS — BP 130/64 | HR 52 | Temp 98.1°F | Ht 72.0 in | Wt 200.6 lb

## 2019-11-04 DIAGNOSIS — I10 Essential (primary) hypertension: Secondary | ICD-10-CM | POA: Diagnosis not present

## 2019-11-04 DIAGNOSIS — E1169 Type 2 diabetes mellitus with other specified complication: Secondary | ICD-10-CM

## 2019-11-04 DIAGNOSIS — Z794 Long term (current) use of insulin: Secondary | ICD-10-CM

## 2019-11-04 DIAGNOSIS — E1122 Type 2 diabetes mellitus with diabetic chronic kidney disease: Secondary | ICD-10-CM | POA: Diagnosis not present

## 2019-11-04 DIAGNOSIS — I2510 Atherosclerotic heart disease of native coronary artery without angina pectoris: Secondary | ICD-10-CM

## 2019-11-04 DIAGNOSIS — N183 Chronic kidney disease, stage 3 unspecified: Secondary | ICD-10-CM

## 2019-11-04 DIAGNOSIS — E785 Hyperlipidemia, unspecified: Secondary | ICD-10-CM

## 2019-11-04 DIAGNOSIS — E1159 Type 2 diabetes mellitus with other circulatory complications: Secondary | ICD-10-CM

## 2019-11-04 DIAGNOSIS — M79672 Pain in left foot: Secondary | ICD-10-CM | POA: Diagnosis not present

## 2019-11-04 LAB — POCT GLYCOSYLATED HEMOGLOBIN (HGB A1C): Hemoglobin A1C: 7.1 % — AB (ref 4.0–5.6)

## 2019-11-04 NOTE — Patient Instructions (Addendum)
We will call you within two weeks about your referral to Podiatry. If you do not hear within 3 weeks, give [place] a call.   Schedule AWV for 1 year (07/27/20 or later).

## 2019-11-04 NOTE — Progress Notes (Deleted)
Matthew [name] is a 79 y.o. male here for a new problem.  I acted as a Education administrator for [employer], PA-C [employer], [place]  History of Present Illness:   No chief complaint on file.   HPI Foot pain  Past Medical History:  Diagnosis Date  . CLAUDICATION 05/14/2007  . CORONARY ARTERY DISEASE 12/28/2006  . Diabetes mellitus type II, controlled ([place]) 12/28/2006   Actos 30mg , insulin 70/30 20 units BID, amaryl 1mg  previously a1c <8. Worsened due to cold over 5 weeks around 05/2014 eating poorly and eating sugary syrups.   Stomach irritation on metformin.  Lab Results  Component Value Date   HGBA1C 8.7* 06/05/2014       . DIABETES MELLITUS, TYPE II 12/28/2006  . Duodenal ulcer   . Erosive gastritis   . GERD (gastroesophageal reflux disease)   . GOUT 12/28/2006  . Hemorrhoids   . Hiatal hernia   . HYPERLIPIDEMIA 12/28/2006  . HYPERTENSION 12/28/2006  . LATERAL EPICONDYLITIS, RIGHT 12/11/2009  . Myocardial infarction ([place])    23 yrs ago per pt  . Raynaud's disease   . RENAL FAILURE, CHRONIC 09/15/2008  . Tubular adenoma of colon 11/2010     Social History   Socioeconomic History  . Marital status: Married    Spouse name: Not on file  . Number of children: 2  . Years of education: Not on file  . Highest education level: Not on file  Occupational History  . Occupation: retired  Tobacco Use  . Smoking status: Former Smoker    Types: Cigarettes    Quit date: 07/09/1968    Years since quitting: 51.3  . Smokeless tobacco: Former Systems developer    Types: Chew    Quit date: 07/09/1988  . Tobacco comment: minimal use x 10 years   Substance and Sexual Activity  . Alcohol use: No    Comment: last was 6 months  . Drug use: No  . Sexual activity: Not on file  Other Topics Concern  . Not on file  Social History Narrative   Married 1983. 2 sons. No grandkids yet.       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       Hobbies:  volunteer work, watch sports   Social Determinants of Radio broadcast assistant Strain:   . Difficulty of Paying Living Expenses:   Food Insecurity:   . Worried About Charity fundraiser in the Last Year:   . Arboriculturist in the Last Year:   Transportation Needs:   . Film/video editor (Medical):   Marland Kitchen Lack of Transportation (Non-Medical):   Physical Activity:   . Days of Exercise per Week:   . Minutes of Exercise per Session:   Stress:   . Feeling of Stress :   Social Connections:   . Frequency of Communication with Friends and Family:   . Frequency of Social Gatherings with Friends and Family:   . Attends Religious Services:   . Active Member of Clubs or Organizations:   . Attends Archivist Meetings:   Marland Kitchen Marital Status:   Intimate Partner Violence:   . Fear of Current or Ex-Partner:   . Emotionally Abused:   Marland Kitchen Physically Abused:   . Sexually Abused:     Past Surgical History:  Procedure Laterality Date  . COLONOSCOPY    . CORONARY ARTERY BYPASS GRAFT    . KNEE ARTHROSCOPY  2012  .  UPPER GASTROINTESTINAL ENDOSCOPY      Family History  Problem Relation Age of Onset  . Diabetes Mother   . Hypertension Mother   . Stroke Father   . Colon cancer Neg Hx   . Esophageal cancer Neg Hx   . Rectal cancer Neg Hx   . Stomach cancer Neg Hx     Allergies  Allergen Reactions  . Penicillins Hives    REACTION: urticaria (hives)  . Tetracycline Hcl     REACTION: urticaria (hives)  . Allopurinol Itching    Current Medications:   Current Outpatient Medications:  .  aspirin 81 MG tablet, Take 81 mg by mouth daily.  , Disp: , Rfl:  .  atorvastatin (LIPITOR) 40 MG tablet, Take 1 tablet by mouth once daily, Disp: 90 tablet, Rfl: 0 .  azelastine (ASTELIN) 0.1 % nasal spray, USE 1 SPRAY(S) IN EACH NOSTRIL TWICE DAILY AS DIRECTED, Disp: 30 mL, Rfl: 0 .  azithromycin (ZITHROMAX) 250 MG tablet, Take 2 tabs on day 1, then 1 tab daily until finished, Disp: 6  tablet, Rfl: 0 .  chlorthalidone (HYGROTON) 25 MG tablet, Take 1 tablet by mouth once daily, Disp: 90 tablet, Rfl: 0 .  colchicine (COLCRYS) 0.6 MG tablet, Take 1 tablet (0.6 mg total) by mouth daily., Disp: 30 tablet, Rfl: 2 .  cyclobenzaprine (FLEXERIL) 5 MG tablet, TAKE ONE TABLET BY MOUTH TWICE DAILY AS NEEDED FOR MUSCLE SPASM, Disp: 30 tablet, Rfl: 0 .  fexofenadine (ALLEGRA) 180 MG tablet, Take 180 mg by mouth daily., Disp: , Rfl:  .  fluticasone (FLONASE) 50 MCG/ACT nasal spray, INSTILL 2 SPRAYS INTO EACH NOSTRIL ONCE DAILY AS NEEDED FOR ALLERGIES OR RHINITIS, Disp: 16 g, Rfl: 0 .  glimepiride (AMARYL) 1 MG tablet, TAKE 1 TABLET BY MOUTH ONCE DAILY BEFORE BREAKFAST, Disp: 90 tablet, Rfl: 0 .  glucose 4 GM chewable tablet, Chew 1 tablet by mouth once as needed for low blood sugar. Reported on 12/26/2015, Disp: , Rfl:  .  lisinopril (ZESTRIL) 40 MG tablet, Take 1 tablet by mouth once daily, Disp: 90 tablet, Rfl: 0 .  metoprolol succinate (TOPROL-XL) 100 MG 24 hr tablet, Take 1 tablet (100 mg total) by mouth daily. Take with or immediately following a meal., Disp: 90 tablet, Rfl: 1 .  NIFEdipine (PROCARDIA-XL) 30 MG (OSM) 24 hr tablet, Take 30 mg by mouth 2 (two) times daily.  , Disp: , Rfl:  .  NOVOLIN 70/30 RELION (70-30) 100 UNIT/ML injection, INJECT 22 UNITS INTO THE SKIN IN THE MORNING AND 20 UNITS IN THE EVENING, Disp: 10 mL, Rfl: 0 .  pioglitazone (ACTOS) 30 MG tablet, Take 1 tablet by mouth once daily, Disp: 90 tablet, Rfl: 0   Review of Systems:   ROS  Vitals:   There were no vitals filed for this visit.   There is no height or weight on file to calculate BMI.  Physical Exam:   Physical Exam  Results for orders placed or performed in visit on 07/27/19  Comprehensive metabolic panel  Result Value Ref Range   Sodium 140 135 - 145 mEq/L   Potassium 4.1 3.5 - 5.1 mEq/L   Chloride 105 96 - 112 mEq/L   CO2 29 19 - 32 mEq/L   Glucose, Bld 68 (L) 70 - 99 mg/dL   BUN 17 6 - 23  mg/dL   Creatinine, Ser 1.35 0.40 - 1.50 mg/dL   Total Bilirubin 0.5 0.2 - 1.2 mg/dL   Alkaline Phosphatase 55 39 -  117 U/L   AST 16 0 - 37 U/L   ALT 10 0 - 53 U/L   Total Protein 8.0 6.0 - 8.3 g/dL   Albumin 4.1 3.5 - 5.2 g/dL   GFR 62.03 >60.00 mL/min   Calcium 9.7 8.4 - 10.5 mg/dL  LDL cholesterol, direct  Result Value Ref Range   Direct LDL 68.0 mg/dL  Hemoglobin A1c  Result Value Ref Range   Hgb A1c MFr Bld 8.7 (H) 4.6 - 6.5 %  Vitamin B12  Result Value Ref Range   Vitamin B-12 276 211 - 911 pg/mL    Assessment and Plan:   There are no diagnoses linked to this encounter.  . Reviewed expectations re: course of current medical issues. . Discussed self-management of symptoms. . Outlined signs and symptoms indicating need for more acute intervention. . Patient verbalized understanding and all questions were answered. . See orders for this visit as documented in the electronic medical record. . Patient received an After-Visit Summary.  ***  Inda Coke, PA-C

## 2019-11-04 NOTE — Telephone Encounter (Signed)
Noted  

## 2019-11-04 NOTE — Progress Notes (Signed)
Phone [phone] In person visit   Subjective:   Matthew [name] is a 79 y.o. year old very pleasant male patient who presents for/with See problem oriented charting Chief Complaint  Patient presents with  . Follow-up  . foot pain    left foot/ankle    This visit occurred during the SARS-CoV-2 public health emergency.  Safety protocols were in place, including screening questions prior to the visit, additional usage of staff PPE, and extensive cleaning of exam room while observing appropriate contact time as indicated for disinfecting solutions.   Past Medical History-  Patient Active Problem List   Diagnosis Date Noted  . PAD (peripheral artery disease) (HCC)-with claudication 05/14/2007    Priority: High  . Diabetes mellitus with renal complications ([place]) [id number]    Priority: High  .  Coronary artery disease s/p CABG 1997 12/28/2006    Priority: High  . Cervical arthritis ([place]) 10/25/2014    Priority: Medium  . Erectile dysfunction 08/13/2010    Priority: Medium  . CKD (chronic kidney disease), stage III 09/15/2008    Priority: Medium  . Hyperlipidemia associated with type 2 diabetes mellitus ([place]) 12/28/2006    Priority: Medium  . 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 Hypertension associated with diabetes ([place]) 12/28/2006    Priority: Medium  . Allergic rhinitis 11/18/2016    Priority: Low  . Pulmonary nodule seen on imaging study 05/17/2012    Priority: Low    Medications- reviewed and updated Current Outpatient Medications  Medication Sig Dispense Refill  . aspirin 81 MG tablet Take 81 mg by mouth daily.      Marland Kitchen atorvastatin (LIPITOR) 40 MG tablet Take 1 tablet by mouth once daily 90 tablet 0  . azelastine (ASTELIN) 0.1 % nasal spray USE 1 SPRAY(S) IN EACH NOSTRIL TWICE DAILY AS DIRECTED 30 mL 0  . azithromycin (ZITHROMAX) 250 MG tablet Take 2 tabs on day 1, then 1 tab daily until finished 6 tablet 0  . chlorthalidone (HYGROTON) 25 MG tablet  Take 1 tablet by mouth once daily 90 tablet 0  . colchicine (COLCRYS) 0.6 MG tablet Take 1 tablet (0.6 mg total) by mouth daily. 30 tablet 2  . cyclobenzaprine (FLEXERIL) 5 MG tablet TAKE ONE TABLET BY MOUTH TWICE DAILY AS NEEDED FOR MUSCLE SPASM 30 tablet 0  . fexofenadine (ALLEGRA) 180 MG tablet Take 180 mg by mouth daily.    . fluticasone (FLONASE) 50 MCG/ACT nasal spray INSTILL 2 SPRAYS INTO EACH NOSTRIL ONCE DAILY AS NEEDED FOR ALLERGIES OR RHINITIS 16 g 0  . glimepiride (AMARYL) 1 MG tablet TAKE 1 TABLET BY MOUTH ONCE DAILY BEFORE BREAKFAST 90 tablet 0  . glucose 4 GM chewable tablet Chew 1 tablet by mouth once as needed for low blood sugar. Reported on 12/26/2015    . lisinopril (ZESTRIL) 40 MG tablet Take 1 tablet by mouth once daily 90 tablet 0  . metoprolol succinate (TOPROL-XL) 100 MG 24 hr tablet Take 1 tablet (100 mg total) by mouth daily. Take with or immediately following a meal. 90 tablet 1  . NIFEdipine (PROCARDIA-XL) 30 MG (OSM) 24 hr tablet Take 30 mg by mouth 2 (two) times daily.      Marland Kitchen NOVOLIN 70/30 RELION (70-30) 100 UNIT/ML injection INJECT 22 UNITS INTO THE SKIN IN THE MORNING AND 20 UNITS IN THE EVENING 10 mL 0  . pioglitazone (ACTOS) 30 MG tablet Take 1 tablet by mouth once daily 90 tablet 0   No  current facility-administered medications for this visit.     Objective:  BP 130/64   Pulse (!) 52   Temp 98.1 F (36.7 C)   Ht 6' (1.829 m)   Wt 200 lb 9.6 oz (91 kg)   SpO2 94%   BMI 27.21 kg/m  Gen: NAD, resting comfortably CV: RRR no murmurs rubs or gallops Lungs: CTAB no crackles, wheeze, rhonchi  Ext: no edema Skin: warm, dry Neuro: grossly normal, moves all extremities MSK:-Good range of motion of the ankle without pain-patient has pain below left lateral malleolus-he is very tender to palpation over the calcaneus in this area.  Patient also has callus at base of first and fifth MTP-he is very tender with palpation of callus at first MTP- but not as severe as  below lateral malleolus    Assessment and Plan   # Left Foot pain  S: Patient started with left ankle and great toe pain 3 weeks ago. He does reflect back in 3 weeks ago where he made a movement outside and stepped backwards and felt a tingle/twitch in left foot/ankle. Tried colchicine for 3 weeks in case it was gout but has not significantly helped. Ambulating with crutches. Pain worse with ambulation  Did start walking more recently. Also has some pain at base of great toe- MTP joint.  Lab Results  Component Value Date   LABURIC 7.9 (H) 07/14/2018  A/P: Left foot pain-the knee lateral ankle-unclear etiology.  Also has pain at base of first MTP joint.  This does not appear to be gout to me.  I recommended podiatry referral with the 2 separate issues-we could get an x-ray today but they may want different images so we opted to hold off.  # Diabetes- a1c goal of 8 is reasonable with history hypoglycemia S: Medication:Novolin 70/30 22 units in the morning and 20 units in the evening with Actos 30 mg and glimepiride 1 mg. -Patient had declined endocrinology visit in the past.  Last visit he wanted to focus on healthy eating/regular exercise.  Weight is stable today and patient reports had started walking with his wife     CBGs- usually high 80s in mornings. Low into the 60s less than once a month. He skips insulin if morning insulin under 70 Lab Results  Component Value Date   HGBA1C   POC 7.1 (A) 11/04/2019   HGBA1C 8.7 (H) 07/27/2019   HGBA1C 7.5 02/22/2019   A/P: Patient reports improved blood sugars. A1c has improved- Continue current medications as long as A1c is below 8 -otherwise to work on healthy eating/regular exercise efforts once the foot is healed up - had kidney visit last week with nephrology- we will just do POC a1c  #hypertension S: medication: Chlorthalidone 25 mg, lisinopril 40 mg, metoprolol 100 mg extended release, nifedipine 30 mg extended release. Home readings #s:  below 140/90 BP Readings from Last 3 Encounters:  11/04/19 130/64  08/10/19 134/62  07/27/19 130/60  A/P: Stable. Continue current medications.     #hyperlipidemia S: Medication: Atorvastatin 40 mg Lab Results  Component Value Date   CHOL 139 02/22/2019   HDL 50 02/22/2019   LDLCALC 77 02/22/2019   LDLDIRECT 68.0 07/27/2019   TRIG 60 02/22/2019   CHOLHDL 3 07/14/2018   A/P: excellent control with LDL under 70 in January.    Recommended follow up: recommended AWV 07/28/19 or later Future Appointments  Date Time Provider [place]  11/25/2019 11:00 AM Marin Olp, MD [facility]  Lab/Order associations:   ICD-10-CM   1. Left foot pain  M79.672 Ambulatory referral to Podiatry  2. Type 2 diabetes mellitus with stage 3 chronic kidney disease, with long-term current use of insulin, unspecified whether stage 3a or 3b CKD (HCC)  E11.22 POCT HgB A1C   N18.30 CANCELED: Hemoglobin A1c   Z79.4 CANCELED: Comprehensive metabolic panel    CANCELED: Hemoglobin A1c  3. Hypertension associated with diabetes ([place])  E11.59    I10   4. Hyperlipidemia associated with type 2 diabetes mellitus ([place])  E11.69    E78.5    Return precautions advised.  Garret Reddish, MD

## 2019-11-09 ENCOUNTER — Encounter: Payer: Self-pay | Admitting: Podiatry

## 2019-11-09 ENCOUNTER — Ambulatory Visit (INDEPENDENT_AMBULATORY_CARE_PROVIDER_SITE_OTHER): Payer: Medicare Other | Admitting: Podiatry

## 2019-11-09 ENCOUNTER — Other Ambulatory Visit: Payer: Self-pay

## 2019-11-09 ENCOUNTER — Ambulatory Visit (INDEPENDENT_AMBULATORY_CARE_PROVIDER_SITE_OTHER): Payer: Medicare Other

## 2019-11-09 VITALS — Temp 97.0°F

## 2019-11-09 DIAGNOSIS — I2510 Atherosclerotic heart disease of native coronary artery without angina pectoris: Secondary | ICD-10-CM

## 2019-11-09 DIAGNOSIS — M84375A Stress fracture, left foot, initial encounter for fracture: Secondary | ICD-10-CM

## 2019-11-09 DIAGNOSIS — R600 Localized edema: Secondary | ICD-10-CM

## 2019-11-09 DIAGNOSIS — M79672 Pain in left foot: Secondary | ICD-10-CM | POA: Diagnosis not present

## 2019-11-09 NOTE — Progress Notes (Signed)
Subjective:   Patient ID: Matthew [name], male   DOB: 79 y.o.   MRN: [id number]   HPI Patient presents stating that he has developed severe pain in his left heel times approximately 3 weeks.  Feels like he is walking on a broken bone and he thought it was gout but it has not responded to colchicine.  He does remember mis-stepping 3 days before the pain started and it is quite severe as far as pain goes   Review of Systems  All other systems reviewed and are negative.       Objective:  Physical Exam Vitals and nursing note reviewed.  Constitutional:      Appearance: He is well-developed.  Pulmonary:     Effort: Pulmonary effort is normal.  Musculoskeletal:        General: Normal range of motion.  Skin:    General: Skin is warm.  Neurological:     Mental Status: He is alert.     Neurovascular status was found to be mildly diminished but intact bilateral with patient found to have edema in the left foot of a mild nature negative Bevelyn Buckles' sign was noted with severe pain in the lateral calcaneus with no pain along the peroneal tendon currently or within the subtalar joint from the lateral malleolus.  Patient does have moderate edema as discussed     Assessment:  Possibility for stress fracture of the calcaneus left versus possibility for tendinitis and gout or possible low-grade vascular disease     Plan:  H&P reviewed all conditions and x-ray and at this point I carefully applied an Unna boot and I gave strict instructions of any changes in digits or pain were to occur with pressure to take the Unna boot off immediately.  If not leave on 3 days to try to reduce edema and I applied air fracture walker to try to mobilize and take all pressure off the heel and discussed possible CT scan or MRI or bone scan if symptoms are not improving in the next several weeks.  Educated on the possibility for stress fracture  X-ray indicated probability for stress fracture left but unable to make  complete diagnosis yet

## 2019-11-10 ENCOUNTER — Other Ambulatory Visit: Payer: Self-pay | Admitting: Podiatry

## 2019-11-10 DIAGNOSIS — M84375A Stress fracture, left foot, initial encounter for fracture: Secondary | ICD-10-CM

## 2019-11-22 NOTE — Progress Notes (Deleted)
Phone [phone] In person visit   Subjective:   Matthew [name] is a 79 y.o. year old very pleasant male patient who presents for/with See problem oriented charting No chief complaint on file.   This visit occurred during the SARS-CoV-2 public health emergency.  Safety protocols were in place, including screening questions prior to the visit, additional usage of staff PPE, and extensive cleaning of exam room while observing appropriate contact time as indicated for disinfecting solutions.   Past Medical History-  Patient Active Problem List   Diagnosis Date Noted  . Allergic rhinitis 11/18/2016  . Cervical arthritis ([place]) 10/25/2014  . Pulmonary nodule seen on imaging study 05/17/2012  . Erectile dysfunction 08/13/2010  . CKD (chronic kidney disease), stage III 09/15/2008  . PAD (peripheral artery disease) (HCC)-with claudication 05/14/2007  . Diabetes mellitus with renal complications ([place]) [id number]  . Hyperlipidemia associated with type 2 diabetes mellitus ([place]) 12/28/2006  . 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 Hypertension associated with diabetes ([place]) 12/28/2006  .  Coronary artery disease s/p CABG 1997 12/28/2006    Medications- reviewed and updated Current Outpatient Medications  Medication Sig Dispense Refill  . aspirin 81 MG tablet Take 81 mg by mouth daily.      Marland Kitchen atorvastatin (LIPITOR) 40 MG tablet Take 1 tablet by mouth once daily 90 tablet 0  . azelastine (ASTELIN) 0.1 % nasal spray USE 1 SPRAY(S) IN EACH NOSTRIL TWICE DAILY AS DIRECTED 30 mL 0  . chlorthalidone (HYGROTON) 25 MG tablet Take 1 tablet by mouth once daily 90 tablet 0  . colchicine (COLCRYS) 0.6 MG tablet Take 1 tablet (0.6 mg total) by mouth daily. 30 tablet 2  . cyclobenzaprine (FLEXERIL) 5 MG tablet TAKE ONE TABLET BY MOUTH TWICE DAILY AS NEEDED FOR MUSCLE SPASM 30 tablet 0  . fexofenadine (ALLEGRA) 180 MG tablet Take 180 mg by mouth daily.    . fluticasone (FLONASE) 50 MCG/ACT nasal spray INSTILL 2  SPRAYS INTO EACH NOSTRIL ONCE DAILY AS NEEDED FOR ALLERGIES OR RHINITIS 16 g 0  . glimepiride (AMARYL) 1 MG tablet TAKE 1 TABLET BY MOUTH ONCE DAILY BEFORE BREAKFAST 90 tablet 0  . glucose 4 GM chewable tablet Chew 1 tablet by mouth once as needed for low blood sugar. Reported on 12/26/2015    . lisinopril (ZESTRIL) 40 MG tablet Take 1 tablet by mouth once daily 90 tablet 0  . metoprolol succinate (TOPROL-XL) 100 MG 24 hr tablet Take 1 tablet (100 mg total) by mouth daily. Take with or immediately following a meal. 90 tablet 1  . NIFEdipine (PROCARDIA-XL) 30 MG (OSM) 24 hr tablet Take 30 mg by mouth 2 (two) times daily.      Marland Kitchen NOVOLIN 70/30 RELION (70-30) 100 UNIT/ML injection INJECT 22 UNITS INTO THE SKIN IN THE MORNING AND 20 UNITS IN THE EVENING 10 mL 0  . pioglitazone (ACTOS) 30 MG tablet Take 1 tablet by mouth once daily 90 tablet 0   No current facility-administered medications for this visit.     Objective:  There were no vitals taken for this visit. Gen: NAD, resting comfortably CV: RRR no murmurs rubs or gallops Lungs: CTAB no crackles, wheeze, rhonchi Abdomen: soft/nontender/nondistended/normal bowel sounds. No rebound or guarding.  Ext: no edema Skin: warm, dry Neuro: grossly normal, moves all extremities  ***    Assessment and Plan   #hypertension S: medication: Metoprolol 100mg , lisinopril 40mg , Hygroton 25mg  Home readings #s: *** BP Readings from Last 3 Encounters:  11/04/19 130/64  08/10/19 134/62  07/27/19 130/60  A/P: ***  # Diabetes S: Medication: Actos 30Mg , Novolin, Glimipride 1Mg  CBGs- *** Exercise and diet- *** Lab Results  Component Value Date   HGBA1C 7.1 (A) 11/04/2019   HGBA1C 8.7 (H) 07/27/2019   HGBA1C 7.5 02/22/2019    A/P: ***  #hyperlipidemia S: Medication: Atorvastatin 40Mg  Lab Results  Component Value Date   CHOL 139 02/22/2019   HDL 50 02/22/2019   LDLCALC 77 02/22/2019   LDLDIRECT 68.0 07/27/2019   TRIG 60 02/22/2019   CHOLHDL  3 07/14/2018   A/P: ***  #Gout S: *** flares in *** on Colchicine 0.6Mg  Lab Results  Component Value Date   LABURIC 7.9 (H) 07/14/2018   A/P:***   Medicare AWVS: 07/27/2019 *** no physical!!!!  No problem-specific Assessment & Plan notes found for this encounter.   Recommended follow up: ***No follow-ups on file. Future Appointments  Date Time Provider Centre  11/24/2019  1:45 PM Levell July [facility]  11/25/2019 11:00 AM Marin Olp, MD [facility]  11/05/2020 10:40 AM Marin Olp, MD [facility]    Lab/Order associations: No diagnosis found.  No orders of the defined types were placed in this encounter.   Time Spent: *** minutes of total time (10:49 AM***- 10:49 AM***) was spent on the date of the encounter performing the following actions: chart review prior to seeing the patient, obtaining history, performing a medically necessary exam, counseling on the treatment plan, placing orders, and documenting in our EHR.   Return precautions advised.  Clyde Lundborg, CMA

## 2019-11-24 ENCOUNTER — Ambulatory Visit (INDEPENDENT_AMBULATORY_CARE_PROVIDER_SITE_OTHER): Payer: Medicare Other | Admitting: Podiatry

## 2019-11-24 ENCOUNTER — Ambulatory Visit: Payer: Medicare Other

## 2019-11-24 ENCOUNTER — Other Ambulatory Visit: Payer: Self-pay

## 2019-11-24 ENCOUNTER — Encounter: Payer: Self-pay | Admitting: Podiatry

## 2019-11-24 ENCOUNTER — Ambulatory Visit (INDEPENDENT_AMBULATORY_CARE_PROVIDER_SITE_OTHER): Payer: Medicare Other

## 2019-11-24 DIAGNOSIS — R600 Localized edema: Secondary | ICD-10-CM | POA: Diagnosis not present

## 2019-11-24 DIAGNOSIS — I2510 Atherosclerotic heart disease of native coronary artery without angina pectoris: Secondary | ICD-10-CM

## 2019-11-24 DIAGNOSIS — M84375A Stress fracture, left foot, initial encounter for fracture: Secondary | ICD-10-CM

## 2019-11-24 DIAGNOSIS — M1000 Idiopathic gout, unspecified site: Secondary | ICD-10-CM | POA: Diagnosis not present

## 2019-11-24 MED ORDER — TRAMADOL HCL 50 MG PO TABS: 50.0000 mg | ORAL_TABLET | Freq: Three times a day (TID) | ORAL | 0 refills | Status: DC | PRN

## 2019-11-24 NOTE — Progress Notes (Signed)
Subjective:   Patient ID: Matthew [name], male   DOB: 79 y.o.   MRN: [id number]   HPI Patient states he still having severe pain in his left heel and swelling in his midfoot.  States he is had no calf pain or any other symptoms of other issues but the heel itself is been very sore and he is even has trouble wearing the boot due to pain   ROS      Objective:  Physical Exam  Neurovascular status intact negative Bevelyn Buckles' sign was noted with still quite a bit of edema in the left heel and into the left midfoot.  It is very painful in the heel when palpated and swelling in the midfoot is moderately tender     Assessment:  Probability for calcaneal stress fracture even though patient has also history of gout with no current indications of blood clot     Plan:  H&P reviewed all conditions and x-ray and at this point due to the inconclusive this and the pain is and I am sending for MRI to understand better the calcaneal issues and also I am sending for arthritic profile with uric acid to try to understand could there be some kind of systemic inflammatory condition.  Dispensed ankle compression stocking gave strict instructions of any other issues were to occur he is to contact us immediately  X-ray appears suspicious for calcaneal fracture but impossible to make diagnosis currently

## 2019-11-25 ENCOUNTER — Telehealth: Payer: Self-pay | Admitting: Podiatry

## 2019-11-25 ENCOUNTER — Telehealth: Payer: Self-pay | Admitting: *Deleted

## 2019-11-25 ENCOUNTER — Ambulatory Visit: Payer: Medicare Other | Admitting: Family Medicine

## 2019-11-25 DIAGNOSIS — R600 Localized edema: Secondary | ICD-10-CM

## 2019-11-25 DIAGNOSIS — M84375A Stress fracture, left foot, initial encounter for fracture: Secondary | ICD-10-CM

## 2019-11-25 MED ORDER — TRAMADOL HCL 50 MG PO TABS: 50.0000 mg | ORAL_TABLET | Freq: Three times a day (TID) | ORAL | 0 refills | Status: AC | PRN

## 2019-11-25 NOTE — Telephone Encounter (Signed)
Pt says he never received his printed RX for the traMADol (ULTRAM) 50 MG that Dr. Paulla Dolly prescribed. Please advise.

## 2019-11-25 NOTE — Telephone Encounter (Signed)
[facility] - Matthew [name] states they received a tramadol request and they do not see that pt has been on this medication before.

## 2019-11-25 NOTE — Addendum Note (Signed)
Addended by: Harriett Sine D on: 11/25/2019 04:53 PM   Modules accepted: Orders

## 2019-11-25 NOTE — Telephone Encounter (Signed)
I informed pt I could call the Tramadol to the [facility]. Left message for Tramadol orders called to [facility].

## 2019-11-25 NOTE — Telephone Encounter (Signed)
I spoke with [facility] - Matthew [name] and per [place] for opioid naive pts they can only fill for 5 day on the initial fill, but future orders may fill for more.

## 2019-11-25 NOTE — Telephone Encounter (Signed)
Faxed orders, clinicals and demographics to [facility] for pre-cert and scheduling. 

## 2019-11-27 LAB — CBC WITH DIFFERENTIAL/PLATELET
Absolute Monocytes: 466 cells/uL (ref 200–950)
Basophils Absolute: 13 cells/uL (ref 0–200)
Basophils Relative: 0.2 %
Eosinophils Absolute: 139 cells/uL (ref 15–500)
Eosinophils Relative: 2.2 %
HCT: 44.8 % (ref 38.5–50.0)
Hemoglobin: 14.3 g/dL (ref 13.2–17.1)
Lymphs Abs: 926 cells/uL (ref 850–3900)
MCH: 28.7 pg (ref 27.0–33.0)
MCHC: 31.9 g/dL — ABNORMAL LOW (ref 32.0–36.0)
MCV: 90 fL (ref 80.0–100.0)
MPV: 10.7 fL (ref 7.5–12.5)
Monocytes Relative: 7.4 %
Neutro Abs: 4757 cells/uL (ref 1500–7800)
Neutrophils Relative %: 75.5 %
Platelets: 241 10*3/uL (ref 140–400)
RBC: 4.98 10*6/uL (ref 4.20–5.80)
RDW: 12 % (ref 11.0–15.0)
Total Lymphocyte: 14.7 %
WBC: 6.3 10*3/uL (ref 3.8–10.8)

## 2019-11-27 LAB — ANTI-NUCLEAR AB-TITER (ANA TITER): ANA Titer 1: 1:160 {titer} — ABNORMAL HIGH

## 2019-11-27 LAB — ANA, IFA COMPREHENSIVE PANEL
Anti Nuclear Antibody (ANA): POSITIVE — AB
ENA SM Ab Ser-aCnc: <1 AI
SM/RNP: <1 AI
SSA (Ro) (ENA) Antibody, IgG: <1 AI
SSB (La) (ENA) Antibody, IgG: <1 AI
Scleroderma (Scl-70) (ENA) Antibody, IgG: >8 AI — AB
ds DNA Ab: 22 IU/mL — ABNORMAL HIGH

## 2019-11-27 LAB — C-REACTIVE PROTEIN: CRP: 17.1 mg/L — ABNORMAL HIGH (ref <8.0)

## 2019-11-27 LAB — URIC ACID: Uric Acid, Serum: 8.2 mg/dL — ABNORMAL HIGH (ref 4.0–8.0)

## 2019-11-27 LAB — SEDIMENTATION RATE: Sed Rate: 29 mm/h — ABNORMAL HIGH (ref 0–20)

## 2019-11-27 LAB — RHEUMATOID FACTOR: Rheumatoid fact SerPl-aCnc: <14 IU/mL (ref <14)

## 2019-12-01 ENCOUNTER — Telehealth: Payer: Self-pay | Admitting: *Deleted

## 2019-12-01 MED ORDER — TRAMADOL HCL 50 MG PO TABS: 50.0000 mg | ORAL_TABLET | Freq: Three times a day (TID) | ORAL | 0 refills | Status: AC | PRN

## 2019-12-01 NOTE — Telephone Encounter (Signed)
I spoke with Carlynn Spry and informed of Dr. Mellody Drown Tramadol orders.

## 2019-12-01 NOTE — Telephone Encounter (Signed)
Pt's wife, Barbaraann Share states she is calling concerning pt's pain medication.

## 2019-12-01 NOTE — Telephone Encounter (Signed)
I spoke with pt's wife, Barbaraann Share states pt is in a great deal of pain and the tramadol does help and the MRI is scheduled 12/27/2019. I told pt that I would contact [facility] to see if we could get an earlier appt for MRI and Dr. Josephina Shih the refill of the Tramadol 50mg  #60 one tablet 3 times a day.

## 2019-12-05 ENCOUNTER — Ambulatory Visit
Admission: RE | Admit: 2019-12-05 | Discharge: 2019-12-05 | Disposition: A | Discharge status: Home / Self Care | Payer: Medicare Other | Source: Ambulatory Visit | Attending: Podiatry | Admitting: Podiatry

## 2019-12-05 ENCOUNTER — Other Ambulatory Visit: Payer: Self-pay

## 2019-12-05 DIAGNOSIS — R600 Localized edema: Secondary | ICD-10-CM | POA: Diagnosis not present

## 2019-12-06 ENCOUNTER — Telehealth: Payer: Self-pay | Admitting: *Deleted

## 2019-12-06 NOTE — Telephone Encounter (Signed)
Pt's wife, Barbaraann Share states MRI results are available and they would like the results and what to do to heal.

## 2019-12-07 ENCOUNTER — Telehealth: Payer: Self-pay | Admitting: *Deleted

## 2019-12-07 NOTE — Telephone Encounter (Signed)
Valery:  I talked to Dr. Paulla Dolly today. Patient does not have a torn tendon. Everything looks okay.  I called patient to let them know what Dr. Paulla Dolly said. Patient stated they understood.  Patient will be seen by Dr.Regal next Thursday, December 15, 2019 at 1:15 pm. I did let patient know about their appointment. They thought they were coming on June 29.  Thank you, Rolly Pancake, CMA (AAMA)

## 2019-12-07 NOTE — Telephone Encounter (Signed)
Returned patient's phone call. Patient's wife had called on 12/06/19 regarding MRI Results.  I talked to Dr. Paulla Dolly today and he told me to call patient to let them know that there were no indications of a torn tendon and everything looks okay.  I called patient to let them know this and that they have an appointment with Dr. Paulla Dolly on Thursday, December 15, 2019 at 1:15 pm.  Patient stated they understood.

## 2019-12-15 ENCOUNTER — Ambulatory Visit (INDEPENDENT_AMBULATORY_CARE_PROVIDER_SITE_OTHER): Payer: Medicare Other | Admitting: Podiatry

## 2019-12-15 ENCOUNTER — Other Ambulatory Visit: Payer: Self-pay

## 2019-12-15 ENCOUNTER — Encounter: Payer: Self-pay | Admitting: Podiatry

## 2019-12-15 VITALS — Temp 98.2°F

## 2019-12-15 DIAGNOSIS — I2510 Atherosclerotic heart disease of native coronary artery without angina pectoris: Secondary | ICD-10-CM | POA: Diagnosis not present

## 2019-12-15 DIAGNOSIS — M7752 Other enthesopathy of left foot: Secondary | ICD-10-CM

## 2019-12-15 DIAGNOSIS — M1000 Idiopathic gout, unspecified site: Secondary | ICD-10-CM

## 2019-12-15 DIAGNOSIS — M779 Enthesopathy, unspecified: Secondary | ICD-10-CM

## 2019-12-16 NOTE — Progress Notes (Signed)
Subjective:   Patient ID: Matthew [name], male   DOB: 79 y.o.   MRN: [id number]   HPI Patient states she is getting pain her around the left big toe joint and states the right foot at times hurts and states the left ankle heel are starting to feel somewhat better but he is concerned about gout and wants to review blood work   ROS      Objective:  Physical Exam  Neurovascular status unchanged with inflammation fluid which is now around the first MPJ left with pain with patient having still moderate discomfort in the heel and ankle but improved from previous and mild to moderate right foot pain     Assessment:  Inflammatory capsulitis of the first MPJ left with possible gout attack with gout occurring in both feet of a mild nature     Plan:  Reviewed his blood work indicating elevation of his uric acid of 8.2 elevation of sed rate C-reactive protein and at this point working to keep him on Colcrys on a consistent basis.  He cannot take allopurinol with allergy and I went ahead today I did sterile prep and injected the first MPJ 3 mg Dexasone Kenalog 5 mg Xylocaine discussed topical medicine and that I want him to put weightbearing forces down.  I reviewed his MR I which was negative for fracture of the heel at the current time and I reviewed extensively all blood work.  Reappoint in the next 3 to 4 weeks or earlier if a increase in symptoms should occur

## 2019-12-27 ENCOUNTER — Other Ambulatory Visit: Payer: Medicare Other

## 2019-12-29 ENCOUNTER — Telehealth: Payer: Self-pay

## 2019-12-29 NOTE — Telephone Encounter (Signed)
See below

## 2019-12-29 NOTE — Telephone Encounter (Signed)
Pt called stating he has seen a pediatrist for his foot and ankle pain, that he has also seen Dr. Yong Channel for. He is still having some problem with his foot. He wants to know if it would be recommended that he use Voltaren on his foot. Please Advise.

## 2019-12-29 NOTE — Telephone Encounter (Signed)
Im ok with voltaren on foot as long as no wounds/sores

## 2019-12-30 NOTE — Telephone Encounter (Signed)
Called and spoke with pt and gave him below message.

## 2020-01-06 ENCOUNTER — Telehealth: Payer: Self-pay | Admitting: Podiatry

## 2020-01-06 NOTE — Telephone Encounter (Signed)
Pt called and wanted to request a referral to a ronald gioffeoy  W/emerg ortho  [address] please assist

## 2020-01-09 NOTE — Telephone Encounter (Signed)
Doesn't need referral. He can call there office or go thru his family physician

## 2020-01-09 NOTE — Telephone Encounter (Signed)
Left message informing pt of DR. Regal's 01/09/2020 8:56am statement.

## 2020-01-16 DIAGNOSIS — M25572 Pain in left ankle and joints of left foot: Secondary | ICD-10-CM | POA: Diagnosis not present

## 2020-01-23 DIAGNOSIS — H25013 Cortical age-related cataract, bilateral: Secondary | ICD-10-CM | POA: Diagnosis not present

## 2020-01-23 DIAGNOSIS — Z794 Long term (current) use of insulin: Secondary | ICD-10-CM | POA: Diagnosis not present

## 2020-01-23 DIAGNOSIS — E119 Type 2 diabetes mellitus without complications: Secondary | ICD-10-CM | POA: Diagnosis not present

## 2020-01-23 LAB — HM DIABETES EYE EXAM

## 2020-01-25 DIAGNOSIS — I739 Peripheral vascular disease, unspecified: Secondary | ICD-10-CM | POA: Diagnosis not present

## 2020-01-25 DIAGNOSIS — M79672 Pain in left foot: Secondary | ICD-10-CM | POA: Diagnosis not present

## 2020-01-31 ENCOUNTER — Other Ambulatory Visit: Payer: Self-pay

## 2020-01-31 DIAGNOSIS — I739 Peripheral vascular disease, unspecified: Secondary | ICD-10-CM

## 2020-02-02 ENCOUNTER — Ambulatory Visit (INDEPENDENT_AMBULATORY_CARE_PROVIDER_SITE_OTHER)
Admission: RE | Admit: 2020-02-02 | Discharge: 2020-02-02 | Disposition: A | Discharge status: Home / Self Care | Payer: Medicare Other | Source: Ambulatory Visit | Attending: Vascular Surgery | Admitting: Vascular Surgery

## 2020-02-02 ENCOUNTER — Other Ambulatory Visit: Payer: Self-pay

## 2020-02-02 ENCOUNTER — Ambulatory Visit (INDEPENDENT_AMBULATORY_CARE_PROVIDER_SITE_OTHER): Payer: Medicare Other | Admitting: Vascular Surgery

## 2020-02-02 ENCOUNTER — Ambulatory Visit (HOSPITAL_COMMUNITY)
Admission: RE | Admit: 2020-02-02 | Discharge: 2020-02-02 | Disposition: A | Discharge status: Home / Self Care | Payer: Medicare Other | Source: Ambulatory Visit | Attending: Vascular Surgery | Admitting: Vascular Surgery

## 2020-02-02 ENCOUNTER — Encounter: Payer: Self-pay | Admitting: Vascular Surgery

## 2020-02-02 ENCOUNTER — Other Ambulatory Visit (HOSPITAL_COMMUNITY): Payer: Self-pay | Admitting: Vascular Surgery

## 2020-02-02 VITALS — BP 132/61 | HR 66 | Temp 98.6°F | Resp 20 | Ht 72.0 in | Wt 200.0 lb

## 2020-02-02 DIAGNOSIS — I2510 Atherosclerotic heart disease of native coronary artery without angina pectoris: Secondary | ICD-10-CM

## 2020-02-02 DIAGNOSIS — G609 Hereditary and idiopathic neuropathy, unspecified: Secondary | ICD-10-CM | POA: Diagnosis not present

## 2020-02-02 DIAGNOSIS — I739 Peripheral vascular disease, unspecified: Secondary | ICD-10-CM

## 2020-02-02 MED ORDER — GABAPENTIN 100 MG PO CAPS: 100.0000 mg | ORAL_CAPSULE | Freq: Three times a day (TID) | ORAL | 12 refills | Status: DC

## 2020-02-02 NOTE — Progress Notes (Signed)
Referring Physician: Dr. Gladstone Lighter  Patient name: Matthew [name] MRN: [id number] DOB: [birth year] Sex: male  REASON FOR CONSULT: Bilateral foot pain  HPI: Matthew [name] is a 79 y.o. male, with a several month history of progressively worsening foot pain.  He has a longstanding history of diabetes approximately 15 years.  He states that he began to have pain in the left heel area.  He now has developed numbness tingling in the toes.  He also has a burning and numbness sensation on the plantar aspect of both feet.  He states the right foot is now starting to hurt around the ankle and dorsal foot area.  He states the pain is continuous.  It is worse at nighttime.  It does not really seem to change with position.  He is barely able to walk due to pain in his feet.  He does not really describe claudication symptoms.  He was originally seen by podiatry and thought to potentially have a calcaneus fracture or Achilles tendon problem.  He was also seen by orthopedics.  He was then to [place] for further evaluation as a possible vascular etiology.  He did smoke in the past but has not smoked in 30 years.  He does not have any open wounds currently.  He has been treating the pain with Tylenol and occasionally heating pad.  Did discuss with him today do not use the heating pad any further as he could get a burn with the numbness in his feet.  He also complains of bilateral lower extremity swelling which she stated has been around the same time as all of these other problems with his feet.  It is fairly symmetric with both legs being swollen.  I discussed with him today whether or not he was keeping his legs in a dependent position and he denied this.  He apparently has had difficulty controlling his glucose either up or down in the past.  His last hemoglobin A1c was 8.7.  His primary care physician is trying to adjust this to 8 or less.  Other medical problems include coronary artery disease with prior right saphenous  vein harvest, hyperlipidemia, hypertension all of which have been stable.  He does have a history of CKD 3 and has seen nephrology in the past.  Serum creatinine was 1.04 July 2019.  Of note he did have a carotid duplex scan in 2019 which showed no significant stenosis.  Past Medical History:  Diagnosis Date  . CLAUDICATION 05/14/2007  . CORONARY ARTERY DISEASE 12/28/2006  . Diabetes mellitus type II, controlled ([place]) 12/28/2006   Actos 30mg , insulin 70/30 20 units BID, amaryl 1mg  previously a1c <8. Worsened due to cold over 5 weeks around 05/2014 eating poorly and eating sugary syrups.   Stomach irritation on metformin.  Lab Results  Component Value Date   HGBA1C 8.7* 06/05/2014       . DIABETES MELLITUS, TYPE II 12/28/2006  . Duodenal ulcer   . Erosive gastritis   . GERD (gastroesophageal reflux disease)   . GOUT 12/28/2006  . Hemorrhoids   . Hiatal hernia   . HYPERLIPIDEMIA 12/28/2006  . HYPERTENSION 12/28/2006  . LATERAL EPICONDYLITIS, RIGHT 12/11/2009  . Myocardial infarction ([place])    23 yrs ago per pt  . Raynaud's disease   . RENAL FAILURE, CHRONIC 09/15/2008  . Tubular adenoma of colon 11/2010   Past Surgical History:  Procedure Laterality Date  . COLONOSCOPY    . CORONARY ARTERY BYPASS GRAFT    .  KNEE ARTHROSCOPY  2012  . UPPER GASTROINTESTINAL ENDOSCOPY      Family History  Problem Relation Age of Onset  . Diabetes Mother   . Hypertension Mother   . Stroke Father   . Colon cancer Neg Hx   . Esophageal cancer Neg Hx   . Rectal cancer Neg Hx   . Stomach cancer Neg Hx     SOCIAL HISTORY: Social History   Socioeconomic History  . Marital status: Married    Spouse name: Not on file  . Number of children: 2  . Years of education: Not on file  . Highest education level: Not on file  Occupational History  . Occupation: retired  Tobacco Use  . Smoking status: Former Smoker    Types: Cigarettes    Quit date: 07/09/1968    Years since quitting: 51.6  . Smokeless  tobacco: Former Systems developer    Types: Chew    Quit date: 07/09/1988  . Tobacco comment: minimal use x 10 years   Vaping Use  . Vaping Use: Never used  Substance and Sexual Activity  . Alcohol use: No    Comment: last was 6 months  . Drug use: No  . Sexual activity: Not on file  Other Topics Concern  . Not on file  Social History Narrative   Married 1983. 2 sons. No grandkids yet.       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       Hobbies: volunteer work, watch sports   Social Determinants of Radio broadcast assistant Strain:   . Difficulty of Paying Living Expenses:   Food Insecurity:   . Worried About Charity fundraiser in the Last Year:   . Arboriculturist in the Last Year:   Transportation Needs:   . Film/video editor (Medical):   Marland Kitchen Lack of Transportation (Non-Medical):   Physical Activity:   . Days of Exercise per Week:   . Minutes of Exercise per Session:   Stress:   . Feeling of Stress :   Social Connections:   . Frequency of Communication with Friends and Family:   . Frequency of Social Gatherings with Friends and Family:   . Attends Religious Services:   . Active Member of Clubs or Organizations:   . Attends Archivist Meetings:   Marland Kitchen Marital Status:   Intimate Partner Violence:   . Fear of Current or Ex-Partner:   . Emotionally Abused:   Marland Kitchen Physically Abused:   . Sexually Abused:     Allergies  Allergen Reactions  . Penicillins Hives    REACTION: urticaria (hives)  . Tetracycline Hcl     REACTION: urticaria (hives)  . Allopurinol Itching    Current Outpatient Medications  Medication Sig Dispense Refill  . aspirin 81 MG tablet Take 81 mg by mouth daily.      Marland Kitchen atorvastatin (LIPITOR) 40 MG tablet Take 1 tablet by mouth once daily 90 tablet 0  . azelastine (ASTELIN) 0.1 % nasal spray USE 1 SPRAY(S) IN EACH NOSTRIL TWICE DAILY AS DIRECTED 30 mL 0  . chlorthalidone (HYGROTON)  25 MG tablet Take 1 tablet by mouth once daily 90 tablet 0  . colchicine (COLCRYS) 0.6 MG tablet Take 1 tablet (0.6 mg total) by mouth daily. 30 tablet 2  . cyclobenzaprine (FLEXERIL) 5 MG tablet TAKE ONE TABLET BY MOUTH TWICE DAILY AS NEEDED FOR MUSCLE SPASM 30 tablet 0  .  fexofenadine (ALLEGRA) 180 MG tablet Take 180 mg by mouth daily.    . fluticasone (FLONASE) 50 MCG/ACT nasal spray INSTILL 2 SPRAYS INTO EACH NOSTRIL ONCE DAILY AS NEEDED FOR ALLERGIES OR RHINITIS 16 g 0  . glimepiride (AMARYL) 1 MG tablet TAKE 1 TABLET BY MOUTH ONCE DAILY BEFORE BREAKFAST 90 tablet 0  . glucose 4 GM chewable tablet Chew 1 tablet by mouth once as needed for low blood sugar. Reported on 12/26/2015    . lisinopril (ZESTRIL) 40 MG tablet Take 1 tablet by mouth once daily 90 tablet 0  . metoprolol succinate (TOPROL-XL) 100 MG 24 hr tablet Take 1 tablet (100 mg total) by mouth daily. Take with or immediately following a meal. 90 tablet 1  . NIFEdipine (PROCARDIA-XL) 30 MG (OSM) 24 hr tablet Take 30 mg by mouth 2 (two) times daily.      Marland Kitchen NOVOLIN 70/30 RELION (70-30) 100 UNIT/ML injection INJECT 22 UNITS INTO THE SKIN IN THE MORNING AND 20 UNITS IN THE EVENING 10 mL 0  . pioglitazone (ACTOS) 30 MG tablet Take 1 tablet by mouth once daily 90 tablet 0  . ketorolac (ACULAR) 0.4 % SOLN Place 1 drop into the left eye 4 (four) times daily. (Patient not taking: Reported on 02/02/2020)    . ofloxacin (OCUFLOX) 0.3 % ophthalmic solution Place 1 drop into the left eye 4 (four) times daily. (Patient not taking: Reported on 02/02/2020)    . prednisoLONE acetate (PRED FORTE) 1 % ophthalmic suspension Place 1 drop into the left eye 4 (four) times daily. (Patient not taking: Reported on 02/02/2020)     No current facility-administered medications for this visit.    ROS:   General:  No weight loss, Fever, chills  HEENT: No recent headaches, no nasal bleeding, no visual changes, no sore throat  Neurologic: No dizziness, blackouts,  seizures. No recent symptoms of stroke or mini- stroke. No recent episodes of slurred speech, or temporary blindness.  Cardiac: No recent episodes of chest pain/pressure, no shortness of breath at rest.  No shortness of breath with exertion.  Denies history of atrial fibrillation or irregular heartbeat  Vascular: No history of rest pain in feet.  No history of claudication.  No history of non-healing ulcer, No history of DVT   Pulmonary: No home oxygen, no productive cough, no hemoptysis,  No asthma or wheezing  Musculoskeletal:  [X]  Arthritis, [ ]  Low back pain,  [X]  Joint pain  Hematologic:No history of hypercoagulable state.  No history of easy bleeding.  No history of anemia  Gastrointestinal: No hematochezia or melena,  No gastroesophageal reflux, no trouble swallowing  Urinary: [X]  chronic Kidney disease, [ ]  on HD - [ ]  MWF or [ ]  TTHS, [ ]  Burning with urination, [ ]  Frequent urination, [ ]  Difficulty urinating;   Skin: No rashes  Psychological: No history of anxiety,  No history of depression   Physical Examination  Vitals:   02/02/20 1451  BP: 132/61  Pulse: 66  Resp: 20  Temp: 98.6 F (37 C)  SpO2: 98%  Weight: 200 lb (90.7 kg)  Height: 6' (1.829 m)    Body mass index is 27.12 kg/m.  General:  Alert and oriented, no acute distress HEENT: Normal Neck: No JVD Cardiac: Regular Rate and Rhythm Abdomen: Soft, non-tender, non-distended, no mass Skin: No rash or ulcer  extremity Pulses:  1-2+ radial, brachial, femoral, absent popliteal dorsalis pedis, posterior tibial pulses bilaterally Musculoskeletal: No deformity trace pretibial and pedal edema  Neurologic: Upper and lower extremity motor 5/5 and symmetric  DATA:  Patient had an attempt at bilateral ABIs today but could not tolerate the blood pressure cuff on his ankles.  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  There was tibial flow.  I reviewed and interpreted the study.  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  However, he does also have an element of arterial occlusive disease.  It is difficult to know whether or not his neuropathy is secondary to his diabetes or his peripheral arterial disease.  It is certainly probably a combination of both.   PLAN: 1.  Patient was started on Neurontin 100 mg 3 times daily today and we will consider titrating this up to relieve his neuropathy symptoms.  2.  Patient will be scheduled for aortogram lower extremity runoff possible intervention on Friday February 10, 2020.  Risk benefits possible complications and procedure details including but not limited to bleeding infection vessel injury contrast reaction renal dysfunction were discussed the patient and his wife today.  He understands and agrees to proceed.   Ruta Hinds, MD [facility] Office: [phone]

## 2020-02-02 NOTE — H&P (View-Only) (Signed)
Referring Physician: Dr. Gladstone Lighter  Patient name: Matthew [name] MRN: [id number] DOB: 08/27/1940 Sex: male  REASON FOR CONSULT: Bilateral foot pain  HPI: Matthew [name] is a 79 y.o. male, with a several month history of progressively worsening foot pain.  He has a longstanding history of diabetes approximately 15 years.  He states that he began to have pain in the left heel area.  He now has developed numbness tingling in the toes.  He also has a burning and numbness sensation on the plantar aspect of both feet.  He states the right foot is now starting to hurt around the ankle and dorsal foot area.  He states the pain is continuous.  It is worse at nighttime.  It does not really seem to change with position.  He is barely able to walk due to pain in his feet.  He does not really describe claudication symptoms.  He was originally seen by podiatry and thought to potentially have a calcaneus fracture or Achilles tendon problem.  He was also seen by orthopedics.  He was then to [place] for further evaluation as a possible vascular etiology.  He did smoke in the past but has not smoked in 30 years.  He does not have any open wounds currently.  He has been treating the pain with Tylenol and occasionally heating pad.  Did discuss with him today do not use the heating pad any further as he could get a burn with the numbness in his feet.  He also complains of bilateral lower extremity swelling which she stated has been around the same time as all of these other problems with his feet.  It is fairly symmetric with both legs being swollen.  I discussed with him today whether or not he was keeping his legs in a dependent position and he denied this.  He apparently has had difficulty controlling his glucose either up or down in the past.  His last hemoglobin A1c was 8.7.  His primary care physician is trying to adjust this to 8 or less.  Other medical problems include coronary artery disease with prior right saphenous  vein harvest, hyperlipidemia, hypertension all of which have been stable.  He does have a history of CKD 3 and has seen nephrology in the past.  Serum creatinine was 1.04 July 2019.  Of note he did have a carotid duplex scan in 2019 which showed no significant stenosis.  Past Medical History:  Diagnosis Date  . CLAUDICATION 05/14/2007  . CORONARY ARTERY DISEASE 12/28/2006  . Diabetes mellitus type II, controlled ([place]) 12/28/2006   Actos 30mg , insulin 70/30 20 units BID, amaryl 1mg  previously a1c <8. Worsened due to cold over 5 weeks around 05/2014 eating poorly and eating sugary syrups.   Stomach irritation on metformin.  Lab Results  Component Value Date   HGBA1C 8.7* 06/05/2014       . DIABETES MELLITUS, TYPE II 12/28/2006  . Duodenal ulcer   . Erosive gastritis   . GERD (gastroesophageal reflux disease)   . GOUT 12/28/2006  . Hemorrhoids   . Hiatal hernia   . HYPERLIPIDEMIA 12/28/2006  . HYPERTENSION 12/28/2006  . LATERAL EPICONDYLITIS, RIGHT 12/11/2009  . Myocardial infarction ([place])    23 yrs ago per pt  . Raynaud's disease   . RENAL FAILURE, CHRONIC 09/15/2008  . Tubular adenoma of colon 11/2010   Past Surgical History:  Procedure Laterality Date  . COLONOSCOPY    . CORONARY ARTERY BYPASS GRAFT    .  KNEE ARTHROSCOPY  2012  . UPPER GASTROINTESTINAL ENDOSCOPY      Family History  Problem Relation Age of Onset  . Diabetes Mother   . Hypertension Mother   . Stroke Father   . Colon cancer Neg Hx   . Esophageal cancer Neg Hx   . Rectal cancer Neg Hx   . Stomach cancer Neg Hx     SOCIAL HISTORY: Social History   Socioeconomic History  . Marital status: Married    Spouse name: Not on file  . Number of children: 2  . Years of education: Not on file  . Highest education level: Not on file  Occupational History  . Occupation: retired  Tobacco Use  . Smoking status: Former Smoker    Types: Cigarettes    Quit date: 07/09/1968    Years since quitting: 51.6  . Smokeless  tobacco: Former Systems developer    Types: Chew    Quit date: 07/09/1988  . Tobacco comment: minimal use x 10 years   Vaping Use  . Vaping Use: Never used  Substance and Sexual Activity  . Alcohol use: No    Comment: last was 6 months  . Drug use: No  . Sexual activity: Not on file  Other Topics Concern  . Not on file  Social History Narrative   Married 1983. 2 sons. No grandkids yet.       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       Hobbies: volunteer work, watch sports   Social Determinants of Radio broadcast assistant Strain:   . Difficulty of Paying Living Expenses:   Food Insecurity:   . Worried About Charity fundraiser in the Last Year:   . Arboriculturist in the Last Year:   Transportation Needs:   . Film/video editor (Medical):   Marland Kitchen Lack of Transportation (Non-Medical):   Physical Activity:   . Days of Exercise per Week:   . Minutes of Exercise per Session:   Stress:   . Feeling of Stress :   Social Connections:   . Frequency of Communication with Friends and Family:   . Frequency of Social Gatherings with Friends and Family:   . Attends Religious Services:   . Active Member of Clubs or Organizations:   . Attends Archivist Meetings:   Marland Kitchen Marital Status:   Intimate Partner Violence:   . Fear of Current or Ex-Partner:   . Emotionally Abused:   Marland Kitchen Physically Abused:   . Sexually Abused:     Allergies  Allergen Reactions  . Penicillins Hives    REACTION: urticaria (hives)  . Tetracycline Hcl     REACTION: urticaria (hives)  . Allopurinol Itching    Current Outpatient Medications  Medication Sig Dispense Refill  . aspirin 81 MG tablet Take 81 mg by mouth daily.      Marland Kitchen atorvastatin (LIPITOR) 40 MG tablet Take 1 tablet by mouth once daily 90 tablet 0  . azelastine (ASTELIN) 0.1 % nasal spray USE 1 SPRAY(S) IN EACH NOSTRIL TWICE DAILY AS DIRECTED 30 mL 0  . chlorthalidone (HYGROTON)  25 MG tablet Take 1 tablet by mouth once daily 90 tablet 0  . colchicine (COLCRYS) 0.6 MG tablet Take 1 tablet (0.6 mg total) by mouth daily. 30 tablet 2  . cyclobenzaprine (FLEXERIL) 5 MG tablet TAKE ONE TABLET BY MOUTH TWICE DAILY AS NEEDED FOR MUSCLE SPASM 30 tablet 0  .  fexofenadine (ALLEGRA) 180 MG tablet Take 180 mg by mouth daily.    . fluticasone (FLONASE) 50 MCG/ACT nasal spray INSTILL 2 SPRAYS INTO EACH NOSTRIL ONCE DAILY AS NEEDED FOR ALLERGIES OR RHINITIS 16 g 0  . glimepiride (AMARYL) 1 MG tablet TAKE 1 TABLET BY MOUTH ONCE DAILY BEFORE BREAKFAST 90 tablet 0  . glucose 4 GM chewable tablet Chew 1 tablet by mouth once as needed for low blood sugar. Reported on 12/26/2015    . lisinopril (ZESTRIL) 40 MG tablet Take 1 tablet by mouth once daily 90 tablet 0  . metoprolol succinate (TOPROL-XL) 100 MG 24 hr tablet Take 1 tablet (100 mg total) by mouth daily. Take with or immediately following a meal. 90 tablet 1  . NIFEdipine (PROCARDIA-XL) 30 MG (OSM) 24 hr tablet Take 30 mg by mouth 2 (two) times daily.      Marland Kitchen NOVOLIN 70/30 RELION (70-30) 100 UNIT/ML injection INJECT 22 UNITS INTO THE SKIN IN THE MORNING AND 20 UNITS IN THE EVENING 10 mL 0  . pioglitazone (ACTOS) 30 MG tablet Take 1 tablet by mouth once daily 90 tablet 0  . ketorolac (ACULAR) 0.4 % SOLN Place 1 drop into the left eye 4 (four) times daily. (Patient not taking: Reported on 02/02/2020)    . ofloxacin (OCUFLOX) 0.3 % ophthalmic solution Place 1 drop into the left eye 4 (four) times daily. (Patient not taking: Reported on 02/02/2020)    . prednisoLONE acetate (PRED FORTE) 1 % ophthalmic suspension Place 1 drop into the left eye 4 (four) times daily. (Patient not taking: Reported on 02/02/2020)     No current facility-administered medications for this visit.    ROS:   General:  No weight loss, Fever, chills  HEENT: No recent headaches, no nasal bleeding, no visual changes, no sore throat  Neurologic: No dizziness, blackouts,  seizures. No recent symptoms of stroke or mini- stroke. No recent episodes of slurred speech, or temporary blindness.  Cardiac: No recent episodes of chest pain/pressure, no shortness of breath at rest.  No shortness of breath with exertion.  Denies history of atrial fibrillation or irregular heartbeat  Vascular: No history of rest pain in feet.  No history of claudication.  No history of non-healing ulcer, No history of DVT   Pulmonary: No home oxygen, no productive cough, no hemoptysis,  No asthma or wheezing  Musculoskeletal:  [X]  Arthritis, [ ]  Low back pain,  [X]  Joint pain  Hematologic:No history of hypercoagulable state.  No history of easy bleeding.  No history of anemia  Gastrointestinal: No hematochezia or melena,  No gastroesophageal reflux, no trouble swallowing  Urinary: [X]  chronic Kidney disease, [ ]  on HD - [ ]  MWF or [ ]  TTHS, [ ]  Burning with urination, [ ]  Frequent urination, [ ]  Difficulty urinating;   Skin: No rashes  Psychological: No history of anxiety,  No history of depression   Physical Examination  Vitals:   02/02/20 1451  BP: 132/61  Pulse: 66  Resp: 20  Temp: 98.6 F (37 C)  SpO2: 98%  Weight: 200 lb (90.7 kg)  Height: 6' (1.829 m)    Body mass index is 27.12 kg/m.  General:  Alert and oriented, no acute distress HEENT: Normal Neck: No JVD Cardiac: Regular Rate and Rhythm Abdomen: Soft, non-tender, non-distended, no mass Skin: No rash or ulcer  extremity Pulses:  1-2+ radial, brachial, femoral, absent popliteal dorsalis pedis, posterior tibial pulses bilaterally Musculoskeletal: No deformity trace pretibial and pedal edema  Neurologic: Upper and lower extremity motor 5/5 and symmetric  DATA:  Patient had an attempt at bilateral ABIs today but could not tolerate the blood pressure cuff on his ankles.  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  There was tibial flow.  I reviewed and interpreted the study.  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  However, he does also have an element of arterial occlusive disease.  It is difficult to know whether or not his neuropathy is secondary to his diabetes or his peripheral arterial disease.  It is certainly probably a combination of both.   PLAN: 1.  Patient was started on Neurontin 100 mg 3 times daily today and we will consider titrating this up to relieve his neuropathy symptoms.  2.  Patient will be scheduled for aortogram lower extremity runoff possible intervention on Friday February 10, 2020.  Risk benefits possible complications and procedure details including but not limited to bleeding infection vessel injury contrast reaction renal dysfunction were discussed the patient and his wife today.  He understands and agrees to proceed.   Ruta Hinds, MD [facility] Office: [phone]

## 2020-02-03 ENCOUNTER — Other Ambulatory Visit: Payer: Self-pay

## 2020-02-06 ENCOUNTER — Other Ambulatory Visit: Payer: Self-pay | Admitting: Family Medicine

## 2020-02-09 ENCOUNTER — Encounter: Payer: Self-pay | Admitting: Family Medicine

## 2020-02-09 ENCOUNTER — Other Ambulatory Visit (HOSPITAL_COMMUNITY)
Admission: RE | Admit: 2020-02-09 | Discharge: 2020-02-09 | Disposition: A | Discharge status: Home / Self Care | Payer: Medicare Other | Source: Ambulatory Visit | Attending: Vascular Surgery | Admitting: Vascular Surgery

## 2020-02-09 DIAGNOSIS — Z20822 Contact with and (suspected) exposure to covid-19: Secondary | ICD-10-CM | POA: Diagnosis not present

## 2020-02-09 DIAGNOSIS — Z01812 Encounter for preprocedural laboratory examination: Secondary | ICD-10-CM | POA: Insufficient documentation

## 2020-02-09 LAB — SARS CORONAVIRUS 2 (TAT 6-24 HRS): SARS Coronavirus 2: NEGATIVE

## 2020-02-10 ENCOUNTER — Encounter (HOSPITAL_COMMUNITY)
Admission: RE | Disposition: A | Discharge status: Home / Self Care | Payer: Self-pay | Source: Home / Self Care | Attending: Vascular Surgery

## 2020-02-10 ENCOUNTER — Other Ambulatory Visit: Payer: Self-pay

## 2020-02-10 ENCOUNTER — Ambulatory Visit (HOSPITAL_COMMUNITY)
Admission: RE | Admit: 2020-02-10 | Discharge: 2020-02-10 | Disposition: A | Discharge status: Home / Self Care | Payer: Medicare Other | Attending: Vascular Surgery | Admitting: Vascular Surgery

## 2020-02-10 DIAGNOSIS — E785 Hyperlipidemia, unspecified: Secondary | ICD-10-CM | POA: Insufficient documentation

## 2020-02-10 DIAGNOSIS — Z794 Long term (current) use of insulin: Secondary | ICD-10-CM | POA: Diagnosis not present

## 2020-02-10 DIAGNOSIS — I2510 Atherosclerotic heart disease of native coronary artery without angina pectoris: Secondary | ICD-10-CM | POA: Diagnosis not present

## 2020-02-10 DIAGNOSIS — E1140 Type 2 diabetes mellitus with diabetic neuropathy, unspecified: Secondary | ICD-10-CM | POA: Diagnosis not present

## 2020-02-10 DIAGNOSIS — K219 Gastro-esophageal reflux disease without esophagitis: Secondary | ICD-10-CM | POA: Diagnosis not present

## 2020-02-10 DIAGNOSIS — E1151 Type 2 diabetes mellitus with diabetic peripheral angiopathy without gangrene: Secondary | ICD-10-CM | POA: Diagnosis not present

## 2020-02-10 DIAGNOSIS — Z87891 Personal history of nicotine dependence: Secondary | ICD-10-CM | POA: Diagnosis not present

## 2020-02-10 DIAGNOSIS — I70213 Atherosclerosis of native arteries of extremities with intermittent claudication, bilateral legs: Secondary | ICD-10-CM | POA: Insufficient documentation

## 2020-02-10 DIAGNOSIS — I10 Essential (primary) hypertension: Secondary | ICD-10-CM | POA: Insufficient documentation

## 2020-02-10 DIAGNOSIS — Z7982 Long term (current) use of aspirin: Secondary | ICD-10-CM | POA: Diagnosis not present

## 2020-02-10 DIAGNOSIS — I252 Old myocardial infarction: Secondary | ICD-10-CM | POA: Diagnosis not present

## 2020-02-10 DIAGNOSIS — Z79899 Other long term (current) drug therapy: Secondary | ICD-10-CM | POA: Insufficient documentation

## 2020-02-10 DIAGNOSIS — M109 Gout, unspecified: Secondary | ICD-10-CM | POA: Diagnosis not present

## 2020-02-10 HISTORY — PX: ABDOMINAL AORTOGRAM W/LOWER EXTREMITY: CATH118223

## 2020-02-10 LAB — GLUCOSE, CAPILLARY
Glucose-Capillary: 79 mg/dL (ref 70–99)
Glucose-Capillary: 28 mg/dL — CL (ref 70–99)
Glucose-Capillary: 36 mg/dL — CL (ref 70–99)
Glucose-Capillary: 45 mg/dL — ABNORMAL LOW (ref 70–99)
Glucose-Capillary: 119 mg/dL — ABNORMAL HIGH (ref 70–99)

## 2020-02-10 LAB — POCT I-STAT, CHEM 8
BUN: 29 mg/dL — ABNORMAL HIGH (ref 8–23)
Calcium, Ion: 1.29 mmol/L (ref 1.15–1.40)
Chloride: 103 mmol/L (ref 98–111)
Creatinine, Ser: 1.5 mg/dL — ABNORMAL HIGH (ref 0.61–1.24)
Glucose, Bld: 85 mg/dL (ref 70–99)
HCT: 40 % (ref 39.0–52.0)
Hemoglobin: 13.6 g/dL (ref 13.0–17.0)
Potassium: 4.4 mmol/L (ref 3.5–5.1)
Sodium: 143 mmol/L (ref 135–145)
TCO2: 28 mmol/L (ref 22–32)

## 2020-02-10 SURGERY — ABDOMINAL AORTOGRAM W/LOWER EXTREMITY
Anesthesia: LOCAL

## 2020-02-10 MED ORDER — IODIXANOL 320 MG/ML IV SOLN
INTRAVENOUS | Status: DC | PRN
  Administered 2020-02-10: 105 mL

## 2020-02-10 MED ORDER — HEPARIN (PORCINE) IN NACL 1000-0.9 UT/500ML-% IV SOLN
INTRAVENOUS | Status: AC
  Filled 2020-02-10: qty 1000

## 2020-02-10 MED ORDER — SODIUM CHLORIDE 0.9 % IV SOLN: INTRAVENOUS | Status: DC

## 2020-02-10 MED ORDER — LIDOCAINE HCL (PF) 1 % IJ SOLN
INTRAMUSCULAR | Status: AC
  Filled 2020-02-10: qty 30

## 2020-02-10 MED ORDER — LIDOCAINE HCL (PF) 1 % IJ SOLN
INTRAMUSCULAR | Status: DC | PRN
  Administered 2020-02-10: 15 mL via INTRADERMAL

## 2020-02-10 MED ORDER — HEPARIN (PORCINE) IN NACL 1000-0.9 UT/500ML-% IV SOLN
INTRAVENOUS | Status: DC | PRN
  Administered 2020-02-10 (×2): 500 mL

## 2020-02-10 MED ORDER — OXYCODONE HCL 5 MG PO TABS
ORAL_TABLET | ORAL | Status: AC
  Filled 2020-02-10: qty 1

## 2020-02-10 MED ORDER — OXYCODONE HCL 5 MG PO TABS
5.0000 mg | ORAL_TABLET | ORAL | Status: DC | PRN
  Administered 2020-02-10: 5 mg via ORAL

## 2020-02-10 SURGICAL SUPPLY — 11 items
CATH ANGIO 5F PIGTAIL 65CM (CATHETERS) ×2 IMPLANT
CATH CROSS OVER TEMPO 5F (CATHETERS) ×2 IMPLANT
CATH STRAIGHT 5FR 65CM (CATHETERS) ×2 IMPLANT
KIT PV (KITS) ×2 IMPLANT
SHEATH PINNACLE 5F 10CM (SHEATH) ×2 IMPLANT
SHEATH PROBE COVER 6X72 (BAG) ×2 IMPLANT
SYR MEDRAD MARK V 150ML (SYRINGE) ×2 IMPLANT
TRANSDUCER W/STOPCOCK (MISCELLANEOUS) ×2 IMPLANT
TRAY PV CATH (CUSTOM PROCEDURE TRAY) ×2 IMPLANT
WIRE BENTSON .035X145CM (WIRE) ×2 IMPLANT
WIRE ROSEN-J .035X180CM (WIRE) ×2 IMPLANT

## 2020-02-10 NOTE — Progress Notes (Signed)
R groin 5 Fr arterial sheath pulled, manual pressure held X 20 minutes.  Bleeding and hematoma instructions reviewed with pt.  CBG rechecked 38 on new machine.  Pt completely asymptomatic with blood sugar, eating and drinking now.

## 2020-02-10 NOTE — Interval H&P Note (Signed)
History and Physical Interval Note:  02/10/2020 10:34 AM  Matthew [name]  has presented today for surgery, with the diagnosis of PAD.  The various methods of treatment have been discussed with the patient and family. After consideration of risks, benefits and other options for treatment, the patient has consented to  Procedure(s): ABDOMINAL AORTOGRAM W/LOWER EXTREMITY (N/A) as a surgical intervention.  The patient's history has been reviewed, patient examined, no change in status, stable for surgery.  I have reviewed the patient's chart and labs.  Questions were answered to the patient's satisfaction.     Ruta Hinds

## 2020-02-10 NOTE — Discharge Instructions (Signed)
Femoral Site Care This sheet gives you information about how to care for yourself after your procedure. Your health care provider may also give you more specific instructions. If you have problems or questions, contact your health care provider. What can I expect after the procedure? After the procedure, it is common to have:  Bruising that usually fades within 1-2 weeks.  Tenderness at the site. Follow these instructions at home: Wound care  Follow instructions from your health care provider about how to take care of your insertion site. Make sure you: ? Wash your hands with soap and water before you change your bandage (dressing). If soap and water are not available, use hand sanitizer. ? Change your dressing as told by your health care provider. ? Leave stitches (sutures), skin glue, or adhesive strips in place. These skin closures may need to stay in place for 2 weeks or longer. If adhesive strip edges start to loosen and curl up, you may trim the loose edges. Do not remove adhesive strips completely unless your health care provider tells you to do that.  Do not take baths, swim, or use a hot tub until your health care provider approves.  You may shower 24-48 hours after the procedure or as told by your health care provider. ? Gently wash the site with plain soap and water. ? Pat the area dry with a clean towel. ? Do not rub the site. This may cause bleeding.  Do not apply powder or lotion to the site. Keep the site clean and dry.  Check your femoral site every day for signs of infection. Check for: ? Redness, swelling, or pain. ? Fluid or blood. ? Warmth. ? Pus or a bad smell. Activity  For the first 2-3 days after your procedure, or as long as directed: ? Avoid climbing stairs as much as possible. ? Do not squat.  Do not lift anything that is heavier than 10 lb (4.5 kg), or the limit that you are told, until your health care provider says that it is safe.  Rest as  directed. ? Avoid sitting for a long time without moving. Get up to take short walks every 1-2 hours.  Do not drive for 24 hours if you were given a medicine to help you relax (sedative). General instructions  Take over-the-counter and prescription medicines only as told by your health care provider.  Keep all follow-up visits as told by your health care provider. This is important. Contact a health care provider if you have:  A fever or chills.  You have redness, swelling, or pain around your insertion site. Get help right away if:  The catheter insertion area swells very fast.  You pass out.  You suddenly start to sweat or your skin gets clammy.  The catheter insertion area is bleeding, and the bleeding does not stop when you hold steady pressure on the area.  The area near or just beyond the catheter insertion site becomes pale, cool, tingly, or numb. These symptoms may represent a serious problem that is an emergency. Do not wait to see if the symptoms will go away. Get medical help right away. Call your local emergency services (911 in the [place] Do not drive yourself to the hospital. Summary  After the procedure, it is common to have bruising that usually fades within 1-2 weeks.  Check your femoral site every day for signs of infection.  Do not lift anything that is heavier than 10 lb (4.5 kg), or the   limit that you are told, until your health care provider says that it is safe. This information is not intended to replace advice given to you by your health care provider. Make sure you discuss any questions you have with your health care provider. Document Revised: 06/29/2017 Document Reviewed: 06/29/2017 Elsevier Patient Education  2020 Elsevier Inc.  

## 2020-02-10 NOTE — Interval H&P Note (Signed)
History and Physical Interval Note:  02/10/2020 10:52 AM  Matthew [name]  has presented today for surgery, with the diagnosis of PAD.  The various methods of treatment have been discussed with the patient and family. After consideration of risks, benefits and other options for treatment, the patient has consented to  Procedure(s): ABDOMINAL AORTOGRAM W/LOWER EXTREMITY (N/A) as a surgical intervention.  The patient's history has been reviewed, patient examined, no change in status, stable for surgery.  I have reviewed the patient's chart and labs.  Questions were answered to the patient's satisfaction.     Ruta Hinds

## 2020-02-10 NOTE — Op Note (Addendum)
Procedure: Abdominal aortogram with bilateral lower extremity runoff  Preoperative diagnosis: Claudication  Postoperative diagnosis: Same  Anesthesia: Local  Operative findings: Flush occlusion superficial femoral artery with three-vessel runoff bilaterally reconstitution above-knee popliteal  Operative details: After team informed consent, the patient was taken to the [place] lab.  The patient was placed in supine incisional angio table.  Both groins prepped and draped in usual sterile fashion.  Local anesthesia was infiltrated of the right common femoral artery.  Ultrasound was used identify the right common femoral artery and femoral bifurcation.  An introducer needle was used to cannulate the right common femoral artery and 035 versa core wire threaded in the abdominal aorta under fluoroscopic guidance.  Next a 5 French sheath placed over the guidewire in the right common femoral artery.  This was thoroughly flushed with heparinized saline.  5 French pigtail catheter was then advanced over the guidewire and an abdominal aortogram was obtained in AP projection.  Left and right renal arteries are widely patent.  Infrarenal abdominal aorta is widely patent.  Left and right common external and internal iliac arteries are widely patent.  An additional oblique view of the pelvis was obtained after pulling the pigtail catheter down to the distal aorta to make sure that there was no significant iliac occlusive disease.  Next bilateral lower extremity runoff views were obtained.  First the pigtail catheter was removed over guidewire.  Then a 5 [place] crossover catheter was used to selectively catheterize the left common iliac artery and the guidewire advanced into the mid left profunda.  The crossover catheter was then removed and swapped out for a 5 French straight catheter.  Left lower extremity angiogram was then obtained through the catheter.  Left common femoral artery has some posterior plaque causing about  a 50% stenosis.  There is a flush occlusion of the left superficial femoral artery.  The profunda femoris is widely patent.  There are abundant collaterals from the profunda which reconstitutes the above-knee popliteal artery.  The above and below-knee popliteal artery is patent.  There is three-vessel runoff to the foot.  There is some distal tibial disease out in the foot towards the digital vessels.  The 5 French straight catheter was then removed and lower extremity runoff view was obtained through the sheath on the right side.  On the right side there is also a flush occlusion of the right superficial femoral artery with mild common femoral disease.  The profunda femoris is widely patent.  There are abundant collaterals from the profunda that reconstitute the above-knee popliteal artery on the right.  There is some mild tibial disease in the anterior tibial artery on the right side.  All 3 tibial vessels however are intact.  There are multiple clips down the right medial leg suggestive of previous vein harvest in the right distal leg.  At this point the 5 French sheath was left in place to be pulled the holding area.  The patient tired procedure well and there were no complications.  Operative management: The patient will be scheduled in the near future for left common femoral endarterectomy and left femoral to above-knee popliteal bypass with interval right femoral to above-knee popliteal bypass a few weeks after that he has recovered from the first bypass.  Tentatively this will be scheduled for February 27, 2020.  He will need preoperative cardiology evaluation.  Ruta Hinds, MD [facility] Office: [phone]

## 2020-02-10 NOTE — Progress Notes (Signed)
After completing meal and soda, CBG 119. Glucose Lab sent for verification. Pt in good spirits. Will continue to monitor.  Jerald Kief, RN

## 2020-02-10 NOTE — Progress Notes (Signed)
Pt CBG registering 24. Second check 28. Pt oriented with no complaints and asymptomatic. Pt provided Apple Sauce and Coke. Dr. Oneida Alar paged. Will recheck in 15 mins. And continue to monitor.  Jerald Kief, RN

## 2020-02-13 ENCOUNTER — Encounter (HOSPITAL_COMMUNITY): Payer: Self-pay | Admitting: Vascular Surgery

## 2020-02-13 LAB — GLUCOSE, CAPILLARY
Glucose-Capillary: 24 mg/dL — CL (ref 70–99)
Glucose-Capillary: 17 mg/dL — CL (ref 70–99)
Glucose-Capillary: 39 mg/dL — CL (ref 70–99)

## 2020-02-14 ENCOUNTER — Telehealth: Payer: Self-pay | Admitting: Cardiovascular Disease

## 2020-02-14 ENCOUNTER — Encounter: Payer: Self-pay | Admitting: Cardiovascular Disease

## 2020-02-14 ENCOUNTER — Other Ambulatory Visit: Payer: Self-pay

## 2020-02-14 NOTE — Telephone Encounter (Signed)
error 

## 2020-02-14 NOTE — Telephone Encounter (Addendum)
Spoke with patient, he is scheduled to come in tomorrow at 4:15 pm for preop clearance. Pt is aware to arrive 15 mins early for appt.

## 2020-02-14 NOTE — Telephone Encounter (Signed)
[name] from [facility] calling stating the patient needs an appointment for cardiac clearance. She states his surgery is on 02/27/2020 and will be sending the clearance request. The soonest available with an APP was 02/27/2020, but she states he needs to be seen sooner and would like him to be worked in.

## 2020-02-14 NOTE — Telephone Encounter (Signed)
Tyreka I can see him tomorrow at 4:15 pm.  Can you call him and get him scheduled? Thanks! Wynette [place]

## 2020-02-15 ENCOUNTER — Other Ambulatory Visit: Payer: Self-pay

## 2020-02-15 ENCOUNTER — Encounter: Payer: Self-pay | Admitting: Physician Assistant

## 2020-02-15 ENCOUNTER — Ambulatory Visit (INDEPENDENT_AMBULATORY_CARE_PROVIDER_SITE_OTHER): Payer: Medicare Other | Admitting: Physician Assistant

## 2020-02-15 VITALS — BP 100/52 | HR 56 | Ht 72.0 in | Wt 189.0 lb

## 2020-02-15 DIAGNOSIS — Z01810 Encounter for preprocedural cardiovascular examination: Secondary | ICD-10-CM

## 2020-02-15 DIAGNOSIS — E782 Mixed hyperlipidemia: Secondary | ICD-10-CM | POA: Diagnosis not present

## 2020-02-15 DIAGNOSIS — I739 Peripheral vascular disease, unspecified: Secondary | ICD-10-CM | POA: Diagnosis not present

## 2020-02-15 DIAGNOSIS — I10 Essential (primary) hypertension: Secondary | ICD-10-CM

## 2020-02-15 DIAGNOSIS — Z794 Long term (current) use of insulin: Secondary | ICD-10-CM | POA: Diagnosis not present

## 2020-02-15 DIAGNOSIS — E118 Type 2 diabetes mellitus with unspecified complications: Secondary | ICD-10-CM | POA: Diagnosis not present

## 2020-02-15 DIAGNOSIS — I2510 Atherosclerotic heart disease of native coronary artery without angina pectoris: Secondary | ICD-10-CM

## 2020-02-15 NOTE — Progress Notes (Addendum)
Cardiology Office Note:    Date:  02/15/2020   ID:  JANICE SEALES, DOB 10/19/1940, MRN [id number]  PCP:  Marin Olp, MD  Cardiologist:  Sherren Mocha, MD   Electrophysiologist:  None   Referring MD: Marin Olp, MD   Chief Complaint:  Surgical Clearance    Patient Profile:    Matthew [name] is a 79 y.o. male with:   Coronary artery disease  S/p CABG in 1997  Myoview 5/13: no ischemia   Echocardiogram 7/19: EF 55-60, Gr 1 DD  Peripheral arterial disease   Hx of bilat SFA occlusions managed medically since 2003  Diabetes mellitus   Chronic kidney disease   GERD; PUD  Hypertension   Hyperlipidemia   Prior CV studies: Carotid US 01/22/2018 Bilateral ICA 1-39  Echocardiogram 01/21/2018 EF 55-60, normal wall motion, GR 1 DD  Myoview 11/10/2011 EF 60, inferobasal thinning, no ischemia; low risk  Cardiac catheterization 07/20/01 L-LAD patent S-Dx patent S-PDA/PL2 patent   History of Present Illness:    Matthew [name] was last seen by Dr. Burt Knack in 05/2019.  He was recently seen by Dr. Oneida Alar with vascular surgery for bilateral foot pain.  He underwent an angiogram that demonstrated bilateral SFA disease.  He needs a L CFA endarterectomy and L Fem to above the knee pop bypass with Dr. Oneida Alar.  He will need a R fem to above the knee pop bypass a few weeks later.  He returns for surgical clearance.    He is here today with his wife. He arrives in a wheelchair.  He has been unable to walk more than a few feet.  He has not had chest pain, significant shortness of breath, syncope, orthopnea.  He has swelling in both legs.    Past Medical History:  Diagnosis Date  . CLAUDICATION 05/14/2007  . CORONARY ARTERY DISEASE 12/28/2006  . Diabetes mellitus type II, controlled ([place]) 12/28/2006   Actos 30mg , insulin 70/30 20 units BID, amaryl 1mg  previously a1c <8. Worsened due to cold over 5 weeks around 05/2014 eating poorly and eating sugary syrups.   Stomach  irritation on metformin.  Lab Results  Component Value Date   HGBA1C 8.7* 06/05/2014       . DIABETES MELLITUS, TYPE II 12/28/2006  . Duodenal ulcer   . Erosive gastritis   . GERD (gastroesophageal reflux disease)   . GOUT 12/28/2006  . Hemorrhoids   . Hiatal hernia   . HYPERLIPIDEMIA 12/28/2006  . HYPERTENSION 12/28/2006  . LATERAL EPICONDYLITIS, RIGHT 12/11/2009  . Myocardial infarction ([place])    23 yrs ago per pt  . Raynaud's disease   . RENAL FAILURE, CHRONIC 09/15/2008  . Tubular adenoma of colon 11/2010    Current Medications: Current Meds  Medication Sig  . aspirin 81 MG tablet Take 81 mg by mouth daily.    Marland Kitchen atorvastatin (LIPITOR) 40 MG tablet Take 1 tablet by mouth once daily  . azelastine (ASTELIN) 0.1 % nasal spray Place 1 spray into both nostrils as needed for rhinitis. Use in each nostril as directed  . chlorthalidone (HYGROTON) 25 MG tablet Take 1 tablet by mouth once daily  . cholecalciferol (VITAMIN D3) 25 MCG (1000 UNIT) tablet Take 1,000 Units by mouth daily.  . colchicine 0.6 MG tablet Take 0.6 mg by mouth daily as needed (for gout).  . cyclobenzaprine (FLEXERIL) 5 MG tablet TAKE ONE TABLET BY MOUTH TWICE DAILY AS NEEDED FOR MUSCLE SPASM  . fexofenadine (ALLEGRA) 180 MG tablet Take  180 mg by mouth daily.  . fluticasone (FLONASE) 50 MCG/ACT nasal spray INSTILL 2 SPRAYS INTO EACH NOSTRIL ONCE DAILY AS NEEDED FOR ALLERGIES OR RHINITIS  . gabapentin (NEURONTIN) 100 MG capsule Take 1 capsule (100 mg total) by mouth 3 (three) times daily.  Marland Kitchen glimepiride (AMARYL) 1 MG tablet TAKE 1 TABLET BY MOUTH ONCE DAILY BEFORE BREAKFAST  . glucose 4 GM chewable tablet Chew 1 tablet by mouth once as needed for low blood sugar. Reported on 12/26/2015  . lisinopril (ZESTRIL) 40 MG tablet Take 1 tablet by mouth once daily  . metoprolol succinate (TOPROL-XL) 100 MG 24 hr tablet Take 1 tablet (100 mg total) by mouth daily. Take with or immediately following a meal.  . NIFEdipine (PROCARDIA-XL)  30 MG (OSM) 24 hr tablet Take 30 mg by mouth 2 (two) times daily.    Marland Kitchen NOVOLIN 70/30 RELION (70-30) 100 UNIT/ML injection INJECT 22 UNITS INTO THE SKIN IN THE MORNING AND 20 UNITS IN THE EVENING  . pioglitazone (ACTOS) 30 MG tablet Take 1 tablet by mouth once daily     Allergies:   Penicillins, Tetracycline hcl, and Allopurinol   Social History   Tobacco Use  . Smoking status: Former Smoker    Types: Cigarettes    Quit date: 07/09/1968    Years since quitting: 51.6  . Smokeless tobacco: Former Systems developer    Types: Chew    Quit date: 07/09/1988  . Tobacco comment: minimal use x 10 years   Vaping Use  . Vaping Use: Never used  Substance Use Topics  . Alcohol use: No    Comment: last was 6 months  . Drug use: No     Family Hx: The patient's family history includes Diabetes in his mother; Hypertension in his mother; Stroke in his father. There is no history of Colon cancer, Esophageal cancer, Rectal cancer, or Stomach cancer.  Review of Systems  Constitutional: Negative for fever.  Gastrointestinal: Negative for hematochezia and melena.  Genitourinary: Negative for hematuria.     EKGs/Labs/Other Test Reviewed:    EKG:  EKG is   ordered today.  The ekg ordered today demonstrates sinus brady, HR 56, normal axis, 1st degree AVB, septal Qs, QTc 387, non-specific ST-TW changes   Recent Labs: 02/22/2019: TSH 1.28 07/27/2019: ALT 10 11/24/2019: Platelets 241 02/10/2020: BUN 29; Creatinine, Ser 1.50; Hemoglobin 13.6; Potassium 4.4; Sodium 143   Recent Lipid Panel Lab Results  Component Value Date/Time   CHOL 139 02/22/2019 12:00 AM   TRIG 60 02/22/2019 12:00 AM   HDL 50 02/22/2019 12:00 AM   CHOLHDL 3 07/14/2018 10:15 AM   LDLCALC 77 02/22/2019 12:00 AM   LDLDIRECT 68.0 07/27/2019 11:58 AM    Physical Exam:    VS:  BP (!) 100/52   Pulse (!) 56   Ht 6' (1.829 m)   Wt 189 lb (85.7 kg)   SpO2 90%   BMI 25.63 kg/m     Wt Readings from Last 3 Encounters:  02/15/20 189 lb (85.7  kg)  02/10/20 198 lb (89.8 kg)  02/02/20 200 lb (90.7 kg)     Constitutional:      Appearance: Healthy appearance. Not in distress.  Pulmonary:     Effort: Pulmonary effort is normal.     Breath sounds: No wheezing. No rales.  Cardiovascular:     Bradycardia present. Regular rhythm. Normal S1. Normal S2.     Murmurs: There is no murmur.  Edema:    Pretibial: bilateral 1+ edema  of the pretibial area. Abdominal:     Palpations: Abdomen is soft.  Musculoskeletal:     Cervical back: Neck supple. Skin:    General: Skin is warm and dry.  Neurological:     General: No focal deficit present.     Mental Status: Alert and oriented to person, place and time.     Cranial Nerves: Cranial nerves are intact.       ASSESSMENT & PLAN:    1. Preoperative cardiovascular examination 2. PAD (peripheral artery disease) (HCC)-with claudication Hx of CABG in 1997.  Grafts were patent by cardiac catheterization in 2003.  Myoview in 2013 was low risk.  EF was normal by echocardiogram in 2019.  He is not having anginal symptoms.  However, I am unable to evaluate his functional status due to his limitations from his peripheral arterial disease.     Matthew [name] perioperative risk of a major cardiac event is 6.6% according to the Revised Cardiac Risk Index (RCRI).  Therefore, he is at high risk for perioperative complications.   His functional capacity is poor at 3.08 METs according to the Duke Activity Status Index (DASI). Recommendations: According to ACC/AHA guidelines, the patient will require stress testing for further risk stratification. A Lexiscan Myoview will be arranged ASAP Antiplatelet and/or Anticoagulation Recommendations: Aspirin can be held for 7 days prior to his surgery.  Please resume Aspirin post operatively when it is felt to be safe from a bleeding standpoint.   3. Coronary artery disease involving native coronary artery of native heart without angina pectoris No anginal symptoms.   But he needs a Myoview for risk stratification.  This will be arranged ASAP.  Continue ASA, statin, beta-blocker.  4. Type 2 diabetes mellitus with complication, with long-term current use of insulin (HCC) Lab Results  Component Value Date   HGBA1C 7.1 (A) 11/04/2019   Fair control.  Empagliflozin could be considered given the results of the EMPA-REG OUTCOME trial.  5. Mixed hyperlipidemia Continue high intensity statin Rx. LDL in 07/2019 optimal.    6. Essential hypertension The patient's blood pressure is controlled on his current regimen.  Continue current therapy.      Dispo:  Return in about 3 months (around 05/17/2020) for Routine Follow Up, w/ Dr. Burt Knack, in person.   Medication Adjustments/Labs and Tests Ordered: Current medicines are reviewed at length with the patient today.  Concerns regarding medicines are outlined above.  Tests Ordered: Orders Placed This Encounter  Procedures  . MYOCARDIAL PERFUSION IMAGING  . EKG 12-Lead   Medication Changes: No orders of the defined types were placed in this encounter.   Signed, Richardson Dopp, PA-C  02/15/2020 5:30 PM    [facility] [address], [place], [place]  [address] Phone: [phone]; Fax: [phone]   Addendum 02/22/20 Myoview done today demonstrates normal perfusion and is low risk. Therefore, the patient may proceed with his surgery at acceptable risk and does not need any further CV testing. Richardson Dopp, PA-C    02/22/2020 5:22 PM

## 2020-02-15 NOTE — Telephone Encounter (Signed)
   Primary Cardiologist: Sherren Mocha, MD  Chart reviewed as part of pre-operative protocol coverage. Patient was seen in the office 02/15/2020 in reference to pre-operative risk assessment for pending surgery as outlined below.   He needs to undergo stress testing for risk stratification prior to his vascular surgery. His test is scheduled 02/22/20. We will be in touch once the results are available.   Richardson Dopp, PA-C 02/15/2020, 5:37 PM

## 2020-02-15 NOTE — Patient Instructions (Signed)
Medication Instructions:  Your physician recommends that you continue on your current medications as directed. Please refer to the Current Medication list given to you today.  *If you need a refill on your cardiac medications before your next appointment, please call your pharmacy*   Lab Work: None If you have labs (blood work) drawn today and your tests are completely normal, you will receive your results only by: Marland Kitchen MyChart Message (if you have MyChart) OR . A paper copy in the mail If you have any lab test that is abnormal or we need to change your treatment, we will call you to review the results.   Testing/Procedures: Your physician has requested that you have a lexiscan myoview. For further information please visit [email or web address]. Please follow instruction sheet, as given.     Follow-Up: At [facility], you and your health needs are our priority.  As part of our continuing mission to provide you with exceptional heart care, we have created designated Provider Care Teams.  These Care Teams include your primary Cardiologist (physician) and Advanced Practice Providers (APPs -  Physician Assistants and Nurse Practitioners) who all work together to provide you with the care you need, when you need it.  We recommend signing up for the patient portal called "MyChart".  Sign up information is provided on this After Visit Summary.  MyChart is used to connect with patients for Virtual Visits (Telemedicine).  Patients are able to view lab/test results, encounter notes, upcoming appointments, etc.  Non-urgent messages can be sent to your provider as well.   To learn more about what you can do with MyChart, go to [email or web address].    Your next appointment:   November 2021   The format for your next appointment:   In Person  Provider:   You may see Sherren Mocha, MD or one of the following Advanced Practice Providers on your designated Care Team:    Richardson Dopp, PA-C  Robbie Lis, [place]    Other Instructions None

## 2020-02-20 ENCOUNTER — Telehealth (HOSPITAL_COMMUNITY): Payer: Self-pay | Admitting: *Deleted

## 2020-02-20 NOTE — Telephone Encounter (Signed)
Patient given detailed instructions per Myocardial Perfusion Study Information Sheet for the test on 02/22/20. Patient notified to arrive 15 minutes early and that it is imperative to arrive on time for appointment to keep from having the test rescheduled.  If you need to cancel or reschedule your appointment, please call the office within 24 hours of your appointment. . Patient verbalized understanding. Kirstie Peri

## 2020-02-22 ENCOUNTER — Ambulatory Visit (HOSPITAL_COMMUNITY): Payer: Medicare Other | Attending: Cardiology

## 2020-02-22 ENCOUNTER — Other Ambulatory Visit: Payer: Self-pay

## 2020-02-22 ENCOUNTER — Encounter: Payer: Self-pay | Admitting: Physician Assistant

## 2020-02-22 DIAGNOSIS — I739 Peripheral vascular disease, unspecified: Secondary | ICD-10-CM | POA: Diagnosis not present

## 2020-02-22 DIAGNOSIS — I2510 Atherosclerotic heart disease of native coronary artery without angina pectoris: Secondary | ICD-10-CM

## 2020-02-22 DIAGNOSIS — Z01810 Encounter for preprocedural cardiovascular examination: Secondary | ICD-10-CM | POA: Diagnosis not present

## 2020-02-22 LAB — MYOCARDIAL PERFUSION IMAGING
LV dias vol: 78 mL (ref 62–150)
LV sys vol: 21 mL
SDS: 0
SRS: 0
SSS: 0
TID: 0.99

## 2020-02-22 MED ORDER — TECHNETIUM TC 99M TETROFOSMIN IV KIT
30.9000 | PACK | Freq: Once | INTRAVENOUS | Status: AC | PRN
  Administered 2020-02-22: 30.9 via INTRAVENOUS
  Filled 2020-02-22: qty 31

## 2020-02-22 MED ORDER — TECHNETIUM TC 99M TETROFOSMIN IV KIT
10.9000 | PACK | Freq: Once | INTRAVENOUS | Status: AC | PRN
  Administered 2020-02-22: 10.9 via INTRAVENOUS
  Filled 2020-02-22: qty 11

## 2020-02-22 MED ORDER — REGADENOSON 0.4 MG/5ML IV SOLN
0.4000 mg | Freq: Once | INTRAVENOUS | Status: AC
  Administered 2020-02-22: 0.4 mg via INTRAVENOUS

## 2020-02-23 ENCOUNTER — Encounter: Payer: Self-pay | Admitting: Vascular Surgery

## 2020-02-23 ENCOUNTER — Ambulatory Visit (INDEPENDENT_AMBULATORY_CARE_PROVIDER_SITE_OTHER): Payer: Medicare Other | Admitting: Vascular Surgery

## 2020-02-23 DIAGNOSIS — I739 Peripheral vascular disease, unspecified: Secondary | ICD-10-CM

## 2020-02-23 MED ORDER — GABAPENTIN 300 MG PO CAPS
300.0000 mg | ORAL_CAPSULE | Freq: Three times a day (TID) | ORAL | 0 refills | Status: DC
Start: 2020-02-23 — End: 2020-03-06

## 2020-02-23 NOTE — Progress Notes (Signed)
Virtual Visit via Telephone Note  I connected with Matthew [name] on 02/23/2020 using the [email or web address] by telephone and verified that I was speaking with the correct person using two identifiers. Patient was located at home and accompanied by his wife. I am located at our office on [facility].   The limitations of evaluation and management by telemedicine and the availability of in person appointments have been previously discussed with the patient and are documented in the patients chart. The patient expressed understanding and consented to proceed.  PCP: Marin Olp, MD  History of Present Illness: [name] is a 79 y.o. 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  He is scheduled for a left common femoral endarterectomy and left femoral to above-knee popliteal bypass early next week.  He presents today for further discussions regarding treatment plan and operative procedure.  Patient states that he is still having burning stinging numbness and tingling in both feet despite being on Neurontin 100 mg 3 times a day.  He is requesting to increase this dose.  Past Medical History:  Diagnosis Date  . CLAUDICATION 05/14/2007  . CORONARY ARTERY DISEASE 12/28/2006   Myoview 8/21: EF 73, normal perfusion, low risk   . Diabetes mellitus type II, controlled ([place]) 12/28/2006   Actos 30mg , insulin 70/30 20 units BID, amaryl 1mg  previously a1c <8. Worsened due to cold over 5 weeks around 05/2014 eating poorly and eating sugary syrups.   Stomach irritation on metformin.  Lab Results  Component Value Date   HGBA1C 8.7* 06/05/2014       . DIABETES MELLITUS, TYPE II 12/28/2006  . Duodenal ulcer   . Erosive gastritis   . GERD (gastroesophageal reflux disease)   . GOUT 12/28/2006  . Hemorrhoids   . Hiatal hernia   . HYPERLIPIDEMIA 12/28/2006  . HYPERTENSION 12/28/2006  . LATERAL EPICONDYLITIS, RIGHT 12/11/2009  . Myocardial infarction ([place])    23  yrs ago per pt  . Raynaud's disease   . RENAL FAILURE, CHRONIC 09/15/2008  . Tubular adenoma of colon 11/2010    Past Surgical History:  Procedure Laterality Date  . ABDOMINAL AORTOGRAM W/LOWER EXTREMITY N/A 02/10/2020   Procedure: ABDOMINAL AORTOGRAM W/LOWER EXTREMITY;  Surgeon: Elam Dutch, MD;  Location: [facility];  Service: Cardiovascular;  Laterality: N/A;  . COLONOSCOPY    . CORONARY ARTERY BYPASS GRAFT    . KNEE ARTHROSCOPY  2012  . UPPER GASTROINTESTINAL ENDOSCOPY      Current Meds  Medication Sig  . aspirin EC 81 MG tablet Take 81 mg by mouth daily. Swallow whole.  Marland Kitchen atorvastatin (LIPITOR) 40 MG tablet Take 1 tablet by mouth once daily (Patient taking differently: Take 40 mg by mouth daily. )  . azelastine (ASTELIN) 0.1 % nasal spray Place 1 spray into both nostrils daily as needed for rhinitis. Use in each nostril as directed   . chlorthalidone (HYGROTON) 25 MG tablet Take 1 tablet by mouth once daily (Patient taking differently: Take 25 mg by mouth daily. )  . cholecalciferol (VITAMIN D3) 25 MCG (1000 UNIT) tablet Take 1,000 Units by mouth daily.  . colchicine 0.6 MG tablet Take 0.6 mg by mouth daily as needed (for gout).  . cyclobenzaprine (FLEXERIL) 5 MG tablet TAKE ONE TABLET BY MOUTH TWICE DAILY AS NEEDED FOR MUSCLE SPASM (Patient taking differently: Take 5 mg by mouth 2 (two) times daily as needed for muscle spasms. )  . fexofenadine (ALLEGRA) 180  MG tablet Take 180 mg by mouth daily.  . fluticasone (FLONASE) 50 MCG/ACT nasal spray INSTILL 2 SPRAYS INTO EACH NOSTRIL ONCE DAILY AS NEEDED FOR ALLERGIES OR RHINITIS (Patient taking differently: Place 2 sprays into both nostrils daily as needed (allergies.). )  . glimepiride (AMARYL) 1 MG tablet TAKE 1 TABLET BY MOUTH ONCE DAILY BEFORE BREAKFAST (Patient taking differently: Take 1 mg by mouth daily with breakfast. )  . lisinopril (ZESTRIL) 40 MG tablet Take 1 tablet by mouth once daily (Patient taking differently:  Take 40 mg by mouth daily. )  . metoprolol succinate (TOPROL-XL) 100 MG 24 hr tablet Take 1 tablet (100 mg total) by mouth daily. Take with or immediately following a meal.  . NIFEdipine (PROCARDIA-XL) 30 MG (OSM) 24 hr tablet Take 30 mg by mouth 2 (two) times daily.    Marland Kitchen NOVOLIN 70/30 RELION (70-30) 100 UNIT/ML injection INJECT 22 UNITS INTO THE SKIN IN THE MORNING AND 20 UNITS IN THE EVENING (Patient taking differently: Inject 20-22 Units into the skin See admin instructions. Inject 22 units subcutaneously in the morning & inject 20 units subcutaneously in the evening)  . oxymetazoline (AFRIN) 0.05 % nasal spray Place 1 spray into both nostrils 2 (two) times daily as needed for congestion.  . pioglitazone (ACTOS) 30 MG tablet Take 1 tablet by mouth once daily (Patient taking differently: Take 30 mg by mouth daily. )  . [DISCONTINUED] gabapentin (NEURONTIN) 100 MG capsule Take 1 capsule (100 mg total) by mouth 3 (three) times daily. (Patient taking differently: Take 300 mg by mouth 3 (three) times daily. )   Review of systems: He has no shortness of breath.  He has no chest pain.    Assessment and Plan: Patient with symptoms consistent with peripheral neuropathy.  I had a lengthy discussion with the patient and his wife today and told him there are multiple possible etiologies for peripheral neuropathy.  He also has diabetes which could be the root cause of this.  However, sometimes ischemia can cause neuropathy.  I discussed with him today that the bypass has a 50-50 chance of improving his neuropathic symptoms.  We also discussed today increasing his Neurontin from 100 mg 3 times a day to 300 mg 3 times a day and this was called into his pharmacy.  I discussed with the patient today the risk benefits possible complications and procedure details of left femoropopliteal bypass in addition to the neuropathic symptoms.  These include but are not limited to bleeding infection graft thrombosis potential  limb loss, myocardial events.  I did review his recent cardiac stress test which was no ischemia.  We will make a determination on whether or not to do a bypass in the right leg pending his results from the first leg.  I discussed the assessment and treatment plan with the patient. The patient was provided an opportunity to ask questions and all were answered. The patient agreed with the plan and demonstrated an understanding of the instructions.   The patient was advised to call back or seek an in-person evaluation if the symptoms worsen or if the condition fails to improve as anticipated.  I spent 10 minutes with the patient via telephone encounter.   Signed, Ruta Hinds [facility] Office: [phone]  02/23/2020, 8:57 AM

## 2020-02-23 NOTE — H&P (View-Only) (Signed)
Virtual Visit via Telephone Note  I connected with Matthew [name] on 02/23/2020 using the [employer] by telephone and verified that I was speaking with the correct person using two identifiers. Patient was located at home and accompanied by his wife. I am located at our office on [facility].   The limitations of evaluation and management by telemedicine and the availability of in person appointments have been previously discussed with the patient and are documented in the patients chart. The patient expressed understanding and consented to proceed.  PCP: Marin Olp, MD  History of Present Illness: Matthew [name] is a 79 y.o. 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  He is scheduled for a left common femoral endarterectomy and left femoral to above-knee popliteal bypass early next week.  He presents today for further discussions regarding treatment plan and operative procedure.  Patient states that he is still having burning stinging numbness and tingling in both feet despite being on Neurontin 100 mg 3 times a day.  He is requesting to increase this dose.  Past Medical History:  Diagnosis Date  . CLAUDICATION 05/14/2007  . CORONARY ARTERY DISEASE 12/28/2006   Myoview 8/21: EF 73, normal perfusion, low risk   . Diabetes mellitus type II, controlled ([place]) 12/28/2006   Actos 30mg , insulin 70/30 20 units BID, amaryl 1mg  previously a1c <8. Worsened due to cold over 5 weeks around 05/2014 eating poorly and eating sugary syrups.   Stomach irritation on metformin.  Lab Results  Component Value Date   HGBA1C 8.7* 06/05/2014       . DIABETES MELLITUS, TYPE II 12/28/2006  . Duodenal ulcer   . Erosive gastritis   . GERD (gastroesophageal reflux disease)   . GOUT 12/28/2006  . Hemorrhoids   . Hiatal hernia   . HYPERLIPIDEMIA 12/28/2006  . HYPERTENSION 12/28/2006  . LATERAL EPICONDYLITIS, RIGHT 12/11/2009  . Myocardial infarction ([place])    23  yrs ago per pt  . Raynaud's disease   . RENAL FAILURE, CHRONIC 09/15/2008  . Tubular adenoma of colon 11/2010    Past Surgical History:  Procedure Laterality Date  . ABDOMINAL AORTOGRAM W/LOWER EXTREMITY N/A 02/10/2020   Procedure: ABDOMINAL AORTOGRAM W/LOWER EXTREMITY;  Surgeon: Elam Dutch, MD;  Location: [place] [facility];  Service: Cardiovascular;  Laterality: N/A;  . COLONOSCOPY    . CORONARY ARTERY BYPASS GRAFT    . KNEE ARTHROSCOPY  2012  . UPPER GASTROINTESTINAL ENDOSCOPY      Current Meds  Medication Sig  . aspirin EC 81 MG tablet Take 81 mg by mouth daily. Swallow whole.  Marland Kitchen atorvastatin (LIPITOR) 40 MG tablet Take 1 tablet by mouth once daily (Patient taking differently: Take 40 mg by mouth daily. )  . azelastine (ASTELIN) 0.1 % nasal spray Place 1 spray into both nostrils daily as needed for rhinitis. Use in each nostril as directed   . chlorthalidone (HYGROTON) 25 MG tablet Take 1 tablet by mouth once daily (Patient taking differently: Take 25 mg by mouth daily. )  . cholecalciferol (VITAMIN D3) 25 MCG (1000 UNIT) tablet Take 1,000 Units by mouth daily.  . colchicine 0.6 MG tablet Take 0.6 mg by mouth daily as needed (for gout).  . cyclobenzaprine (FLEXERIL) 5 MG tablet TAKE ONE TABLET BY MOUTH TWICE DAILY AS NEEDED FOR MUSCLE SPASM (Patient taking differently: Take 5 mg by mouth 2 (two) times daily as needed for muscle spasms. )  . fexofenadine (ALLEGRA) 180  MG tablet Take 180 mg by mouth daily.  . fluticasone (FLONASE) 50 MCG/ACT nasal spray INSTILL 2 SPRAYS INTO EACH NOSTRIL ONCE DAILY AS NEEDED FOR ALLERGIES OR RHINITIS (Patient taking differently: Place 2 sprays into both nostrils daily as needed (allergies.). )  . glimepiride (AMARYL) 1 MG tablet TAKE 1 TABLET BY MOUTH ONCE DAILY BEFORE BREAKFAST (Patient taking differently: Take 1 mg by mouth daily with breakfast. )  . lisinopril (ZESTRIL) 40 MG tablet Take 1 tablet by mouth once daily (Patient taking differently:  Take 40 mg by mouth daily. )  . metoprolol succinate (TOPROL-XL) 100 MG 24 hr tablet Take 1 tablet (100 mg total) by mouth daily. Take with or immediately following a meal.  . NIFEdipine (PROCARDIA-XL) 30 MG (OSM) 24 hr tablet Take 30 mg by mouth 2 (two) times daily.    Marland Kitchen NOVOLIN 70/30 RELION (70-30) 100 UNIT/ML injection INJECT 22 UNITS INTO THE SKIN IN THE MORNING AND 20 UNITS IN THE EVENING (Patient taking differently: Inject 20-22 Units into the skin See admin instructions. Inject 22 units subcutaneously in the morning & inject 20 units subcutaneously in the evening)  . oxymetazoline (AFRIN) 0.05 % nasal spray Place 1 spray into both nostrils 2 (two) times daily as needed for congestion.  . pioglitazone (ACTOS) 30 MG tablet Take 1 tablet by mouth once daily (Patient taking differently: Take 30 mg by mouth daily. )  . [DISCONTINUED] gabapentin (NEURONTIN) 100 MG capsule Take 1 capsule (100 mg total) by mouth 3 (three) times daily. (Patient taking differently: Take 300 mg by mouth 3 (three) times daily. )   Review of systems: He has no shortness of breath.  He has no chest pain.    Assessment and Plan: Patient with symptoms consistent with peripheral neuropathy.  I had a lengthy discussion with the patient and his wife today and told him there are multiple possible etiologies for peripheral neuropathy.  He also has diabetes which could be the root cause of this.  However, sometimes ischemia can cause neuropathy.  I discussed with him today that the bypass has a 50-50 chance of improving his neuropathic symptoms.  We also discussed today increasing his Neurontin from 100 mg 3 times a day to 300 mg 3 times a day and this was called into his pharmacy.  I discussed with the patient today the risk benefits possible complications and procedure details of left femoropopliteal bypass in addition to the neuropathic symptoms.  These include but are not limited to bleeding infection graft thrombosis potential  limb loss, myocardial events.  I did review his recent cardiac stress test which was no ischemia.  We will make a determination on whether or not to do a bypass in the right leg pending his results from the first leg.  I discussed the assessment and treatment plan with the patient. The patient was provided an opportunity to ask questions and all were answered. The patient agreed with the plan and demonstrated an understanding of the instructions.   The patient was advised to call back or seek an in-person evaluation if the symptoms worsen or if the condition fails to improve as anticipated.  I spent 10 minutes with the patient via telephone encounter.   Signed, Ruta Hinds [facility] Office: [phone]  02/23/2020, 8:57 AM

## 2020-02-23 NOTE — Progress Notes (Signed)
Your procedure is scheduled on Monday August 30th.  Report to [facility] Main Entrance "A" at 05:30 A.M., and check in at the Admitting office.  Call this number if you have problems the morning of surgery: [phone]  Call [phone] if you have any questions prior to your surgery date Monday-Friday 8am-4pm   Remember: Do not eat or drink after midnight the night before your surgery  Take these medicines the morning of surgery with A SIP OF WATER: atorvastatin (LIPITOR)  fexofenadine (ALLEGRA) gabapentin (NEURONTIN)  metoprolol succinate (TOPROL-XL) NIFEdipine (PROCARDIA-XL)   If needed" azelastine (ASTELIN) nasal spray cyclobenzaprine (FLEXERIL) fluticasone (FLONASE)  Follow your surgeon's instructions on when to stop Aspirin.  If no instructions were given by your surgeon then you will need to call the office to get those instructions.    As of today, STOP taking any Aspirin (unless otherwise instructed by your surgeon), Aleve, Naproxen, Ibuprofen, Motrin, Advil, Goody's, BC's, all herbal medications, fish oil, and all vitamins.   WHAT DO I DO ABOUT MY DIABETES MEDICATION?   Marland Kitchen Do not take oral diabetes medicines (pills) the morning of surgery.  THE DAY/ NIGHT BEFORE SURGERY o glimepiride (AMARYL) - take as usual o pioglitazone (ACTOS) - take as usual o NOVOLIN 70/30 RELION  - AM dose: take as usual: 22 units - PM dose:  14 units THE MORNING OF SURGERY o glimepiride (AMARYL) - NONE o pioglitazone (ACTOS) - NONE o NOVOLIN 70/30 RELION - NONE    Do NOT take any diabetic medications day or surgery!!   HOW TO MANAGE YOUR DIABETES BEFORE AND AFTER SURGERY  Why is it important to control my blood sugar before and after surgery? . Improving blood sugar levels before and after surgery helps healing and can limit problems. . A way of improving blood sugar control is eating a healthy diet by: o  Eating less sugar and carbohydrates o  Increasing  activity/exercise o  Talking with your doctor about reaching your blood sugar goals . High blood sugars (greater than 180 mg/dL) can raise your risk of infections and slow your recovery, so you will need to focus on controlling your diabetes during the weeks before surgery. . Make sure that the doctor who takes care of your diabetes knows about your planned surgery including the date and location.  How do I manage my blood sugar before surgery? . Check your blood sugar at least 4 times a day, starting 2 days before surgery, to make sure that the level is not too high or low. . Check your blood sugar the morning of your surgery when you wake up and every 2 hours until you get to the Short Stay unit. o If your blood sugar is less than 70 mg/dL, you will need to treat for low blood sugar: - Do not take insulin. - Treat a low blood sugar (less than 70 mg/dL) with  cup of clear juice (cranberry or apple), 4 glucose tablets, OR glucose gel. - Recheck blood sugar in 15 minutes after treatment (to make sure it is greater than 70 mg/dL). If your blood sugar is not greater than 70 mg/dL on recheck, call [phone] for further instructions. . Report your blood sugar to the short stay nurse when you get to Short Stay.  . If you are admitted to the hospital after surgery: o Your blood sugar will be checked by the staff and you will probably be given insulin after surgery (instead of oral diabetes medicines) to make sure  you have good blood sugar levels. o The goal for blood sugar control after surgery is 80-180 mg/dL.    The Morning of Surgery  Do not wear jewelry.  Do not wear lotions, powders, colognes, or deodorant Men may shave face and neck.  Do not bring valuables to the hospital.  [facility] is not responsible for any belongings or valuables.  If you are a smoker, DO NOT Smoke 24 hours prior to surgery  If you wear a CPAP at night please bring your mask the morning of surgery   Remember  that you must have someone to transport you home after your surgery, and remain with you for 24 hours if you are discharged the same day.   Please bring cases for contacts, glasses, hearing aids, dentures or bridgework because it cannot be worn into surgery.    Leave your suitcase in the car.  After surgery it may be brought to your room.  For patients admitted to the hospital, discharge time will be determined by your treatment team.  Patients discharged the day of surgery will not be allowed to drive home.    Special instructions:   [place]- Preparing For Surgery  Before surgery, you can play an important role. Because skin is not sterile, your skin needs to be as free of germs as possible. You can reduce the number of germs on your skin by washing with CHG (chlorahexidine gluconate) Soap before surgery.  CHG is an antiseptic cleaner which kills germs and bonds with the skin to continue killing germs even after washing.    Oral Hygiene is also important to reduce your risk of infection.  Remember - BRUSH YOUR TEETH THE MORNING OF SURGERY WITH YOUR REGULAR TOOTHPASTE  Please do not use if you have an allergy to CHG or antibacterial soaps. If your skin becomes reddened/irritated stop using the CHG.  Do not shave (including legs and underarms) for at least 48 hours prior to first CHG shower. It is OK to shave your face.  Please follow these instructions carefully.   1. Shower the NIGHT BEFORE SURGERY and the MORNING OF SURGERY with CHG Soap.   2. If you chose to wash your hair and body, wash as usual with your normal shampoo and body-wash/soap.  3. Rinse your hair and body thoroughly to remove the shampoo and soap.  4. Apply CHG directly to the skin (ONLY FROM THE NECK DOWN) and wash gently with a scrungie or a clean washcloth.   5. Do not use on open wounds or open sores. Avoid contact with your eyes, ears, mouth and genitals (private parts). Wash Face and genitals (private  parts)  with your normal soap.   6. Wash thoroughly, paying special attention to the area where your surgery will be performed.  7. Thoroughly rinse your body with warm water from the neck down.  8. DO NOT shower/wash with your normal soap after using and rinsing off the CHG Soap.  9. Pat yourself dry with a CLEAN TOWEL.  10. Wear CLEAN PAJAMAS to bed the night before surgery  11. Place CLEAN SHEETS on your bed the night of your first shower and DO NOT SLEEP WITH PETS.  12. Wear comfortable clothes the morning of surgery.     Day of Surgery:  Please shower the morning of surgery with the CHG soap Do not apply any deodorants/lotions. Please wear clean clothes to the hospital/surgery center.   Remember to brush your teeth WITH YOUR REGULAR TOOTHPASTE.  Please read over the following fact sheets that you were given.

## 2020-02-24 ENCOUNTER — Encounter (HOSPITAL_COMMUNITY)
Admission: RE | Admit: 2020-02-24 | Discharge: 2020-02-24 | Disposition: A | Discharge status: Home / Self Care | Payer: Medicare Other | Source: Ambulatory Visit | Attending: Vascular Surgery | Admitting: Vascular Surgery

## 2020-02-24 ENCOUNTER — Other Ambulatory Visit (HOSPITAL_COMMUNITY)
Admission: RE | Admit: 2020-02-24 | Discharge: 2020-02-24 | Disposition: A | Discharge status: Home / Self Care | Payer: Medicare Other | Source: Ambulatory Visit | Attending: Vascular Surgery | Admitting: Vascular Surgery

## 2020-02-24 ENCOUNTER — Encounter (HOSPITAL_COMMUNITY): Payer: Self-pay

## 2020-02-24 ENCOUNTER — Other Ambulatory Visit: Payer: Self-pay

## 2020-02-24 DIAGNOSIS — Z01812 Encounter for preprocedural laboratory examination: Secondary | ICD-10-CM | POA: Insufficient documentation

## 2020-02-24 DIAGNOSIS — Z20822 Contact with and (suspected) exposure to covid-19: Secondary | ICD-10-CM | POA: Insufficient documentation

## 2020-02-24 LAB — SURGICAL PCR SCREEN
MRSA, PCR: NEGATIVE
Staphylococcus aureus: NEGATIVE

## 2020-02-24 LAB — COMPREHENSIVE METABOLIC PANEL
ALT: 10 U/L (ref 0–44)
AST: 17 U/L (ref 15–41)
Albumin: 3.6 g/dL (ref 3.5–5.0)
Alkaline Phosphatase: 63 U/L (ref 38–126)
Anion gap: 10 (ref 5–15)
BUN: 25 mg/dL — ABNORMAL HIGH (ref 8–23)
CO2: 26 mmol/L (ref 22–32)
Calcium: 9.6 mg/dL (ref 8.9–10.3)
Chloride: 99 mmol/L (ref 98–111)
Creatinine, Ser: 1.69 mg/dL — ABNORMAL HIGH (ref 0.61–1.24)
GFR calc Af Amer: 44 mL/min — ABNORMAL LOW (ref >60)
GFR calc non Af Amer: 38 mL/min — ABNORMAL LOW (ref >60)
Glucose, Bld: 201 mg/dL — ABNORMAL HIGH (ref 70–99)
Potassium: 4.3 mmol/L (ref 3.5–5.1)
Sodium: 135 mmol/L (ref 135–145)
Total Bilirubin: 1 mg/dL (ref 0.3–1.2)
Total Protein: 8.9 g/dL — ABNORMAL HIGH (ref 6.5–8.1)

## 2020-02-24 LAB — URINALYSIS, ROUTINE W REFLEX MICROSCOPIC
Bilirubin Urine: NEGATIVE
Glucose, UA: NEGATIVE mg/dL
Hgb urine dipstick: NEGATIVE
Ketones, ur: 5 mg/dL — AB
Leukocytes,Ua: NEGATIVE
Nitrite: NEGATIVE
Protein, ur: NEGATIVE mg/dL
Specific Gravity, Urine: 1.019 (ref 1.005–1.030)
pH: 5 (ref 5.0–8.0)

## 2020-02-24 LAB — TYPE AND SCREEN
ABO/RH(D): O POS
Antibody Screen: NEGATIVE

## 2020-02-24 LAB — APTT: aPTT: 31 seconds (ref 24–36)

## 2020-02-24 LAB — CBC
HCT: 40.8 % (ref 39.0–52.0)
Hemoglobin: 12.7 g/dL — ABNORMAL LOW (ref 13.0–17.0)
MCH: 28.1 pg (ref 26.0–34.0)
MCHC: 31.1 g/dL (ref 30.0–36.0)
MCV: 90.3 fL (ref 80.0–100.0)
Platelets: 253 10*3/uL (ref 150–400)
RBC: 4.52 MIL/uL (ref 4.22–5.81)
RDW: 13.9 % (ref 11.5–15.5)
WBC: 7.7 10*3/uL (ref 4.0–10.5)
nRBC: 0 % (ref 0.0–0.2)

## 2020-02-24 LAB — HEMOGLOBIN A1C
Hgb A1c MFr Bld: 8.1 % — ABNORMAL HIGH (ref 4.8–5.6)
Mean Plasma Glucose: 185.77 mg/dL

## 2020-02-24 LAB — PROTIME-INR
INR: 1.2 (ref 0.8–1.2)
Prothrombin Time: 14.9 seconds (ref 11.4–15.2)

## 2020-02-24 LAB — SARS CORONAVIRUS 2 (TAT 6-24 HRS): SARS Coronavirus 2: NEGATIVE

## 2020-02-24 LAB — GLUCOSE, CAPILLARY: Glucose-Capillary: 169 mg/dL — ABNORMAL HIGH (ref 70–99)

## 2020-02-24 NOTE — Progress Notes (Addendum)
PCP - Garret Reddish, MD Cardiologist - Sherren Mocha, MD   Chest x-ray - n/a EKG - 02/15/20 Stress Test - 02/22/20 ECHO - 01/21/18 Cardiac Cath - 02/10/20  (aware that pt sugar on 02/10/20 was low - per pt, d/t his Raynaud's, if his hands are cold, his sugar will give a false extreme low reading - per pt it happens at home "if I wash my hands with cold water, or if my hands are cold at home, my sugar will test below 30 until my hands are warmer")    Fasting Blood Sugar - 80-90s Checks Blood Sugar 1x a day first thing in the morning   Blood Thinner Instructions: n/a Aspirin Instructions: Follow your surgeon's instructions on when to stop Aspirin.  If no instructions were given by your surgeon then you will need to call the office to get those instructions.    Per pt, no ASA DOS    COVID TEST- 02/24/20  Coronavirus Screening  Have you experienced the following symptoms:  Cough yes/no: No Fever (>100.76F)  yes/no: No Runny nose yes/no: No Sore throat yes/no: No Difficulty breathing/shortness of breath  yes/no: No  Have you or a family member traveled in the last 14 days and where? yes/no: No   If the patient indicates "YES" to the above questions, their PAT will be rescheduled to limit the exposure to others and, the surgeon will be notified. THE PATIENT WILL NEED TO BE ASYMPTOMATIC FOR 14 DAYS.   If the patient is not experiencing any of these symptoms, the PAT nurse will instruct them to NOT bring anyone with them to their appointment since they may have these symptoms or traveled as well.   Please remind your patients and families that hospital visitation restrictions are in effect and the importance of the restrictions.     Anesthesia review: cardiac hx  Patient denies shortness of breath, fever, cough and chest pain at PAT appointment   All instructions explained to the patient, with a verbal understanding of the material. Patient agrees to go over the instructions  while at home for a better understanding. Patient also instructed to self quarantine after being tested for COVID-19. The opportunity to ask questions was provided.

## 2020-02-26 NOTE — Anesthesia Preprocedure Evaluation (Addendum)
Anesthesia Evaluation  Patient identified by MRN, date of birth, ID band Patient awake    Reviewed: Allergy & Precautions, NPO status , Patient's Chart, lab work & pertinent test results, reviewed documented beta blocker date and time   History of Anesthesia Complications Negative for: history of anesthetic complications  Airway Mallampati: III  TM Distance: >3 FB Neck ROM: Full    Dental  (+) Caps, Dental Advisory Given, Missing   Pulmonary COPD, former smoker,  02/24/2020 SARS coronavirus NEG   breath sounds clear to auscultation       Cardiovascular hypertension, Pt. on medications and Pt. on home beta blockers (-) angina+ CAD, + Past MI, + CABG and + Peripheral Vascular Disease   Rhythm:Regular Rate:Normal  '19 ECHO: EF 55-60%, no significant valvular abnormalities   Neuro/Psych negative neurological ROS     GI/Hepatic Neg liver ROS, hiatal hernia, GERD  Controlled,  Endo/Other  diabetes (glu 269), Oral Hypoglycemic Agents, Insulin Dependent  Renal/GU Renal InsufficiencyRenal disease (creat 1.69)     Musculoskeletal   Abdominal   Peds  Hematology negative hematology ROS (+)   Anesthesia Other Findings   Reproductive/Obstetrics                           Anesthesia Physical Anesthesia Plan  ASA: III  Anesthesia Plan: General   Post-op Pain Management:    Induction: Intravenous  PONV Risk Score and Plan: 2 and Ondansetron and Dexamethasone  Airway Management Planned: Oral ETT  Additional Equipment: None  Intra-op Plan:   Post-operative Plan: Extubation in OR  Informed Consent: I have reviewed the patients History and Physical, chart, labs and discussed the procedure including the risks, benefits and alternatives for the proposed anesthesia with the patient or authorized representative who has indicated his/her understanding and acceptance.     Dental advisory given  Plan  Discussed with: CRNA and Surgeon  Anesthesia Plan Comments:        Anesthesia Quick Evaluation

## 2020-02-27 ENCOUNTER — Inpatient Hospital Stay (HOSPITAL_COMMUNITY): Payer: Medicare Other | Admitting: Anesthesiology

## 2020-02-27 ENCOUNTER — Inpatient Hospital Stay (HOSPITAL_COMMUNITY)
Admission: RE | Admit: 2020-02-27 | Discharge: 2020-03-02 | DRG: 254 | Disposition: A | Discharge status: Home Health | Payer: Medicare Other | Attending: Vascular Surgery | Admitting: Vascular Surgery

## 2020-02-27 ENCOUNTER — Other Ambulatory Visit: Payer: Self-pay

## 2020-02-27 ENCOUNTER — Encounter (HOSPITAL_COMMUNITY)
Admission: RE | Disposition: A | Discharge status: Home Health | Payer: Self-pay | Source: Home / Self Care | Attending: Vascular Surgery

## 2020-02-27 ENCOUNTER — Encounter (HOSPITAL_COMMUNITY): Payer: Self-pay | Admitting: Vascular Surgery

## 2020-02-27 DIAGNOSIS — E1151 Type 2 diabetes mellitus with diabetic peripheral angiopathy without gangrene: Secondary | ICD-10-CM | POA: Diagnosis not present

## 2020-02-27 DIAGNOSIS — I129 Hypertensive chronic kidney disease with stage 1 through stage 4 chronic kidney disease, or unspecified chronic kidney disease: Secondary | ICD-10-CM | POA: Diagnosis present

## 2020-02-27 DIAGNOSIS — Z7982 Long term (current) use of aspirin: Secondary | ICD-10-CM

## 2020-02-27 DIAGNOSIS — I739 Peripheral vascular disease, unspecified: Secondary | ICD-10-CM | POA: Diagnosis present

## 2020-02-27 DIAGNOSIS — I2510 Atherosclerotic heart disease of native coronary artery without angina pectoris: Secondary | ICD-10-CM | POA: Diagnosis present

## 2020-02-27 DIAGNOSIS — E1122 Type 2 diabetes mellitus with diabetic chronic kidney disease: Secondary | ICD-10-CM | POA: Diagnosis not present

## 2020-02-27 DIAGNOSIS — Z20822 Contact with and (suspected) exposure to covid-19: Secondary | ICD-10-CM | POA: Diagnosis present

## 2020-02-27 DIAGNOSIS — I7300 Raynaud's syndrome without gangrene: Secondary | ICD-10-CM | POA: Diagnosis present

## 2020-02-27 DIAGNOSIS — E785 Hyperlipidemia, unspecified: Secondary | ICD-10-CM | POA: Diagnosis present

## 2020-02-27 DIAGNOSIS — Z794 Long term (current) use of insulin: Secondary | ICD-10-CM | POA: Diagnosis not present

## 2020-02-27 DIAGNOSIS — N183 Chronic kidney disease, stage 3 unspecified: Secondary | ICD-10-CM | POA: Diagnosis not present

## 2020-02-27 DIAGNOSIS — I252 Old myocardial infarction: Secondary | ICD-10-CM

## 2020-02-27 DIAGNOSIS — G629 Polyneuropathy, unspecified: Secondary | ICD-10-CM | POA: Diagnosis not present

## 2020-02-27 DIAGNOSIS — I70209 Unspecified atherosclerosis of native arteries of extremities, unspecified extremity: Secondary | ICD-10-CM | POA: Diagnosis present

## 2020-02-27 DIAGNOSIS — N189 Chronic kidney disease, unspecified: Secondary | ICD-10-CM | POA: Diagnosis present

## 2020-02-27 DIAGNOSIS — Z79899 Other long term (current) drug therapy: Secondary | ICD-10-CM

## 2020-02-27 HISTORY — PX: FEMORAL-POPLITEAL BYPASS GRAFT: SHX937

## 2020-02-27 HISTORY — PX: ENDARTERECTOMY FEMORAL: SHX5804

## 2020-02-27 LAB — CBC
HCT: 33.3 % — ABNORMAL LOW (ref 39.0–52.0)
Hemoglobin: 10.8 g/dL — ABNORMAL LOW (ref 13.0–17.0)
MCH: 28.3 pg (ref 26.0–34.0)
MCHC: 32.4 g/dL (ref 30.0–36.0)
MCV: 87.4 fL (ref 80.0–100.0)
Platelets: 200 10*3/uL (ref 150–400)
RBC: 3.81 MIL/uL — ABNORMAL LOW (ref 4.22–5.81)
RDW: 13.7 % (ref 11.5–15.5)
WBC: 7.7 10*3/uL (ref 4.0–10.5)
nRBC: 0 % (ref 0.0–0.2)

## 2020-02-27 LAB — GLUCOSE, CAPILLARY
Glucose-Capillary: 269 mg/dL — ABNORMAL HIGH (ref 70–99)
Glucose-Capillary: 213 mg/dL — ABNORMAL HIGH (ref 70–99)
Glucose-Capillary: 228 mg/dL — ABNORMAL HIGH (ref 70–99)

## 2020-02-27 LAB — ABO/RH: ABO/RH(D): O POS

## 2020-02-27 LAB — CREATININE, SERUM
Creatinine, Ser: 1.73 mg/dL — ABNORMAL HIGH (ref 0.61–1.24)
GFR calc Af Amer: 43 mL/min — ABNORMAL LOW (ref >60)
GFR calc non Af Amer: 37 mL/min — ABNORMAL LOW (ref >60)

## 2020-02-27 SURGERY — BYPASS GRAFT FEMORAL-POPLITEAL ARTERY
Anesthesia: General | Site: Leg Upper | Laterality: Left

## 2020-02-27 MED ORDER — DEXAMETHASONE SODIUM PHOSPHATE 10 MG/ML IJ SOLN
INTRAMUSCULAR | Status: DC | PRN
  Administered 2020-02-27: 5 mg via INTRAVENOUS

## 2020-02-27 MED ORDER — PANTOPRAZOLE SODIUM 40 MG PO TBEC
40.0000 mg | DELAYED_RELEASE_TABLET | Freq: Every day | ORAL | Status: DC
  Administered 2020-02-28 – 2020-03-02 (×4): 40 mg via ORAL
  Filled 2020-02-27 (×4): qty 1

## 2020-02-27 MED ORDER — ONDANSETRON HCL 4 MG/2ML IJ SOLN
INTRAMUSCULAR | Status: DC | PRN
  Administered 2020-02-27: 4 mg via INTRAVENOUS

## 2020-02-27 MED ORDER — NEOSTIGMINE METHYLSULFATE 10 MG/10ML IV SOLN
INTRAVENOUS | Status: DC | PRN
  Administered 2020-02-27: 3 mg via INTRAVENOUS

## 2020-02-27 MED ORDER — FENTANYL CITRATE (PF) 100 MCG/2ML IJ SOLN
INTRAMUSCULAR | Status: DC | PRN
Start: 2020-02-27 — End: 2020-02-27
  Administered 2020-02-27 (×7): 50 ug via INTRAVENOUS
  Administered 2020-02-27: 100 ug via INTRAVENOUS

## 2020-02-27 MED ORDER — GLYCOPYRROLATE 0.2 MG/ML IJ SOLN
INTRAMUSCULAR | Status: DC | PRN
  Administered 2020-02-27: .4 mg via INTRAVENOUS

## 2020-02-27 MED ORDER — ACETAMINOPHEN 325 MG PO TABS
325.0000 mg | ORAL_TABLET | ORAL | Status: DC | PRN
  Administered 2020-02-28: 650 mg via ORAL
  Filled 2020-02-27: qty 2

## 2020-02-27 MED ORDER — CHLORHEXIDINE GLUCONATE CLOTH 2 % EX PADS: 6.0000 | MEDICATED_PAD | Freq: Once | CUTANEOUS | Status: DC

## 2020-02-27 MED ORDER — LORATADINE 10 MG PO TABS
10.0000 mg | ORAL_TABLET | Freq: Every day | ORAL | Status: DC
  Administered 2020-02-28 – 2020-03-02 (×4): 10 mg via ORAL
  Filled 2020-02-27 (×4): qty 1

## 2020-02-27 MED ORDER — METOPROLOL SUCCINATE ER 25 MG PO TB24
100.0000 mg | ORAL_TABLET | Freq: Once | ORAL | Status: AC
  Administered 2020-02-27: 100 mg via ORAL
  Filled 2020-02-27: qty 4

## 2020-02-27 MED ORDER — FENTANYL CITRATE (PF) 100 MCG/2ML IJ SOLN: 25.0000 ug | INTRAMUSCULAR | Status: DC | PRN

## 2020-02-27 MED ORDER — METOPROLOL TARTRATE 5 MG/5ML IV SOLN: 2.0000 mg | INTRAVENOUS | Status: DC | PRN

## 2020-02-27 MED ORDER — LIDOCAINE HCL (CARDIAC) PF 100 MG/5ML IV SOSY
PREFILLED_SYRINGE | INTRAVENOUS | Status: DC | PRN
  Administered 2020-02-27: 40 mg via INTRAVENOUS

## 2020-02-27 MED ORDER — ROCURONIUM BROMIDE 100 MG/10ML IV SOLN
INTRAVENOUS | Status: DC | PRN
  Administered 2020-02-27: 20 mg via INTRAVENOUS
  Administered 2020-02-27: 80 mg via INTRAVENOUS

## 2020-02-27 MED ORDER — COLCHICINE 0.6 MG PO TABS: 0.6000 mg | ORAL_TABLET | Freq: Every day | ORAL | Status: DC | PRN

## 2020-02-27 MED ORDER — LABETALOL HCL 5 MG/ML IV SOLN: 10.0000 mg | INTRAVENOUS | Status: DC | PRN

## 2020-02-27 MED ORDER — SODIUM CHLORIDE 0.9 % IV SOLN: INTRAVENOUS | Status: DC

## 2020-02-27 MED ORDER — CHLORTHALIDONE 25 MG PO TABS
25.0000 mg | ORAL_TABLET | Freq: Every day | ORAL | Status: DC
  Administered 2020-02-28 – 2020-03-02 (×4): 25 mg via ORAL
  Filled 2020-02-27 (×4): qty 1

## 2020-02-27 MED ORDER — ORAL CARE MOUTH RINSE: 15.0000 mL | Freq: Once | OROMUCOSAL | Status: AC

## 2020-02-27 MED ORDER — FENTANYL CITRATE (PF) 250 MCG/5ML IJ SOLN
INTRAMUSCULAR | Status: AC
  Filled 2020-02-27: qty 5

## 2020-02-27 MED ORDER — AZELASTINE HCL 0.1 % NA SOLN
1.0000 | Freq: Every day | NASAL | Status: DC | PRN
  Filled 2020-02-27: qty 30

## 2020-02-27 MED ORDER — PHENYLEPHRINE HCL-NACL 10-0.9 MG/250ML-% IV SOLN
INTRAVENOUS | Status: DC | PRN
  Administered 2020-02-27: 40 ug/min via INTRAVENOUS

## 2020-02-27 MED ORDER — PROPOFOL 10 MG/ML IV BOLUS
INTRAVENOUS | Status: DC | PRN
  Administered 2020-02-27: 130 mg via INTRAVENOUS

## 2020-02-27 MED ORDER — ACETAMINOPHEN 500 MG PO TABS
1000.0000 mg | ORAL_TABLET | Freq: Once | ORAL | Status: AC
  Administered 2020-02-27: 1000 mg via ORAL
  Filled 2020-02-27: qty 2

## 2020-02-27 MED ORDER — PROMETHAZINE HCL 25 MG/ML IJ SOLN: 6.2500 mg | INTRAMUSCULAR | Status: DC | PRN

## 2020-02-27 MED ORDER — SODIUM CHLORIDE 0.9 % IV SOLN: 500.0000 mL | Freq: Once | INTRAVENOUS | Status: DC | PRN

## 2020-02-27 MED ORDER — FLUTICASONE PROPIONATE 50 MCG/ACT NA SUSP
2.0000 | Freq: Every day | NASAL | Status: DC | PRN
  Filled 2020-02-27: qty 16

## 2020-02-27 MED ORDER — CHLORHEXIDINE GLUCONATE 0.12 % MT SOLN: 15.0000 mL | Freq: Once | OROMUCOSAL | Status: AC

## 2020-02-27 MED ORDER — PHENYLEPHRINE HCL (PRESSORS) 10 MG/ML IV SOLN
INTRAVENOUS | Status: DC | PRN
  Administered 2020-02-27 (×4): 80 ug via INTRAVENOUS

## 2020-02-27 MED ORDER — ACETAMINOPHEN 650 MG RE SUPP: 325.0000 mg | RECTAL | Status: DC | PRN

## 2020-02-27 MED ORDER — ALUM & MAG HYDROXIDE-SIMETH 200-200-20 MG/5ML PO SUSP: 15.0000 mL | ORAL | Status: DC | PRN

## 2020-02-27 MED ORDER — VITAMIN D 25 MCG (1000 UNIT) PO TABS
1000.0000 [IU] | ORAL_TABLET | Freq: Every day | ORAL | Status: DC
  Administered 2020-02-28 – 2020-03-02 (×4): 1000 [IU] via ORAL
  Filled 2020-02-27 (×4): qty 1

## 2020-02-27 MED ORDER — GABAPENTIN 300 MG PO CAPS
300.0000 mg | ORAL_CAPSULE | Freq: Three times a day (TID) | ORAL | Status: DC
  Administered 2020-02-27 – 2020-03-02 (×12): 300 mg via ORAL
  Filled 2020-02-27 (×12): qty 1

## 2020-02-27 MED ORDER — ONDANSETRON HCL 4 MG/2ML IJ SOLN: 4.0000 mg | Freq: Four times a day (QID) | INTRAMUSCULAR | Status: DC | PRN

## 2020-02-27 MED ORDER — HYDRALAZINE HCL 20 MG/ML IJ SOLN: 5.0000 mg | INTRAMUSCULAR | Status: DC | PRN

## 2020-02-27 MED ORDER — INSULIN ASPART 100 UNIT/ML ~~LOC~~ SOLN
0.0000 [IU] | Freq: Three times a day (TID) | SUBCUTANEOUS | Status: DC
  Administered 2020-02-27 – 2020-02-28 (×2): 5 [IU] via SUBCUTANEOUS
  Administered 2020-02-29: 3 [IU] via SUBCUTANEOUS
  Administered 2020-02-29 (×2): 2 [IU] via SUBCUTANEOUS
  Administered 2020-03-01 – 2020-03-02 (×5): 3 [IU] via SUBCUTANEOUS

## 2020-02-27 MED ORDER — MIDAZOLAM HCL 2 MG/2ML IJ SOLN: 0.5000 mg | Freq: Once | INTRAMUSCULAR | Status: DC | PRN

## 2020-02-27 MED ORDER — CYCLOBENZAPRINE HCL 10 MG PO TABS
5.0000 mg | ORAL_TABLET | Freq: Two times a day (BID) | ORAL | Status: DC | PRN
  Administered 2020-02-29 – 2020-03-01 (×2): 5 mg via ORAL
  Filled 2020-02-27 (×3): qty 1

## 2020-02-27 MED ORDER — POLYETHYLENE GLYCOL 3350 17 G PO PACK: 17.0000 g | PACK | Freq: Every day | ORAL | Status: DC | PRN

## 2020-02-27 MED ORDER — VANCOMYCIN HCL IN DEXTROSE 1-5 GM/200ML-% IV SOLN
1000.0000 mg | INTRAVENOUS | Status: AC
  Administered 2020-02-27: 1000 mg via INTRAVENOUS
  Filled 2020-02-27: qty 200

## 2020-02-27 MED ORDER — POTASSIUM CHLORIDE CRYS ER 20 MEQ PO TBCR: 20.0000 meq | EXTENDED_RELEASE_TABLET | Freq: Every day | ORAL | Status: DC | PRN

## 2020-02-27 MED ORDER — MAGNESIUM SULFATE 2 GM/50ML IV SOLN: 2.0000 g | Freq: Every day | INTRAVENOUS | Status: DC | PRN

## 2020-02-27 MED ORDER — ONDANSETRON HCL 4 MG/2ML IJ SOLN
INTRAMUSCULAR | Status: AC
  Filled 2020-02-27: qty 2

## 2020-02-27 MED ORDER — PHENOL 1.4 % MT LIQD: 1.0000 | OROMUCOSAL | Status: DC | PRN

## 2020-02-27 MED ORDER — OXYCODONE-ACETAMINOPHEN 5-325 MG PO TABS
1.0000 | ORAL_TABLET | ORAL | Status: DC | PRN
  Administered 2020-02-27 – 2020-03-01 (×8): 2 via ORAL
  Administered 2020-03-01: 1 via ORAL
  Administered 2020-03-01: 2 via ORAL
  Filled 2020-02-27 (×6): qty 2
  Filled 2020-02-27: qty 1
  Filled 2020-02-27 (×3): qty 2

## 2020-02-27 MED ORDER — OXYCODONE HCL 5 MG PO TABS: 5.0000 mg | ORAL_TABLET | Freq: Once | ORAL | Status: DC | PRN

## 2020-02-27 MED ORDER — FENTANYL CITRATE (PF) 100 MCG/2ML IJ SOLN
INTRAMUSCULAR | Status: AC
  Filled 2020-02-27: qty 2

## 2020-02-27 MED ORDER — HEPARIN SODIUM (PORCINE) 1000 UNIT/ML IJ SOLN
INTRAMUSCULAR | Status: AC
  Filled 2020-02-27: qty 1

## 2020-02-27 MED ORDER — PROTAMINE SULFATE 10 MG/ML IV SOLN
INTRAVENOUS | Status: AC
  Filled 2020-02-27: qty 5

## 2020-02-27 MED ORDER — PROTAMINE SULFATE 10 MG/ML IV SOLN
INTRAVENOUS | Status: DC | PRN
  Administered 2020-02-27: 30 mg via INTRAVENOUS
  Administered 2020-02-27: 20 mg via INTRAVENOUS

## 2020-02-27 MED ORDER — PHENYLEPHRINE 40 MCG/ML (10ML) SYRINGE FOR IV PUSH (FOR BLOOD PRESSURE SUPPORT)
PREFILLED_SYRINGE | INTRAVENOUS | Status: AC
  Filled 2020-02-27: qty 10

## 2020-02-27 MED ORDER — LACTATED RINGERS IV SOLN: INTRAVENOUS | Status: DC | PRN

## 2020-02-27 MED ORDER — CHLORHEXIDINE GLUCONATE 0.12 % MT SOLN
OROMUCOSAL | Status: AC
  Administered 2020-02-27: 15 mL via OROMUCOSAL
  Filled 2020-02-27: qty 15

## 2020-02-27 MED ORDER — HEPARIN SODIUM (PORCINE) 5000 UNIT/ML IJ SOLN
5000.0000 [IU] | Freq: Three times a day (TID) | INTRAMUSCULAR | Status: DC
  Administered 2020-02-28 – 2020-03-02 (×10): 5000 [IU] via SUBCUTANEOUS
  Filled 2020-02-27 (×8): qty 1

## 2020-02-27 MED ORDER — NIFEDIPINE ER OSMOTIC RELEASE 30 MG PO TB24
30.0000 mg | ORAL_TABLET | Freq: Two times a day (BID) | ORAL | Status: DC
  Administered 2020-02-27 – 2020-03-02 (×8): 30 mg via ORAL
  Filled 2020-02-27 (×9): qty 1

## 2020-02-27 MED ORDER — GUAIFENESIN-DM 100-10 MG/5ML PO SYRP: 15.0000 mL | ORAL_SOLUTION | ORAL | Status: DC | PRN

## 2020-02-27 MED ORDER — SODIUM CHLORIDE 0.9 % IV SOLN
INTRAVENOUS | Status: AC
  Filled 2020-02-27: qty 1.2

## 2020-02-27 MED ORDER — OXYMETAZOLINE HCL 0.05 % NA SOLN
1.0000 | Freq: Two times a day (BID) | NASAL | Status: DC | PRN
  Filled 2020-02-27: qty 30

## 2020-02-27 MED ORDER — SODIUM CHLORIDE 0.9 % IV SOLN
INTRAVENOUS | Status: DC | PRN
  Administered 2020-02-27: 500 mL

## 2020-02-27 MED ORDER — ASPIRIN EC 81 MG PO TBEC
81.0000 mg | DELAYED_RELEASE_TABLET | Freq: Every day | ORAL | Status: DC
  Administered 2020-02-28 – 2020-03-02 (×4): 81 mg via ORAL
  Filled 2020-02-27 (×4): qty 1

## 2020-02-27 MED ORDER — LISINOPRIL 40 MG PO TABS: 40.0000 mg | ORAL_TABLET | Freq: Every day | ORAL | Status: DC

## 2020-02-27 MED ORDER — METOPROLOL SUCCINATE ER 100 MG PO TB24
100.0000 mg | ORAL_TABLET | Freq: Every day | ORAL | Status: DC
  Administered 2020-02-27 – 2020-03-02 (×5): 100 mg via ORAL
  Filled 2020-02-27 (×5): qty 1

## 2020-02-27 MED ORDER — HYDROMORPHONE HCL 1 MG/ML IJ SOLN
0.5000 mg | INTRAMUSCULAR | Status: DC | PRN
  Administered 2020-02-27 – 2020-02-29 (×2): 0.5 mg via INTRAVENOUS
  Filled 2020-02-27 (×2): qty 1

## 2020-02-27 MED ORDER — PIOGLITAZONE HCL 30 MG PO TABS
30.0000 mg | ORAL_TABLET | Freq: Every day | ORAL | Status: DC
  Administered 2020-02-28 – 2020-03-02 (×4): 30 mg via ORAL
  Filled 2020-02-27 (×4): qty 1

## 2020-02-27 MED ORDER — CHLORHEXIDINE GLUCONATE CLOTH 2 % EX PADS
6.0000 | MEDICATED_PAD | Freq: Every day | CUTANEOUS | Status: DC
  Administered 2020-02-27 – 2020-03-02 (×2): 6 via TOPICAL

## 2020-02-27 MED ORDER — 0.9 % SODIUM CHLORIDE (POUR BTL) OPTIME
TOPICAL | Status: DC | PRN
  Administered 2020-02-27: 1000 mL

## 2020-02-27 MED ORDER — VANCOMYCIN HCL IN DEXTROSE 1-5 GM/200ML-% IV SOLN
1000.0000 mg | Freq: Two times a day (BID) | INTRAVENOUS | Status: AC
  Administered 2020-02-27 – 2020-02-28 (×2): 1000 mg via INTRAVENOUS
  Filled 2020-02-27 (×2): qty 200

## 2020-02-27 MED ORDER — OXYCODONE HCL 5 MG/5ML PO SOLN: 5.0000 mg | Freq: Once | ORAL | Status: DC | PRN

## 2020-02-27 MED ORDER — HEPARIN SODIUM (PORCINE) 1000 UNIT/ML IJ SOLN
INTRAMUSCULAR | Status: DC | PRN
  Administered 2020-02-27: 9000 [IU] via INTRAVENOUS

## 2020-02-27 MED ORDER — ATORVASTATIN CALCIUM 40 MG PO TABS
40.0000 mg | ORAL_TABLET | Freq: Every day | ORAL | Status: DC
  Administered 2020-02-27 – 2020-03-02 (×5): 40 mg via ORAL
  Filled 2020-02-27 (×5): qty 1

## 2020-02-27 MED ORDER — MEPERIDINE HCL 25 MG/ML IJ SOLN: 6.2500 mg | INTRAMUSCULAR | Status: DC | PRN

## 2020-02-27 MED ORDER — DOCUSATE SODIUM 100 MG PO CAPS
100.0000 mg | ORAL_CAPSULE | Freq: Every day | ORAL | Status: DC
  Administered 2020-02-28 – 2020-03-02 (×4): 100 mg via ORAL
  Filled 2020-02-27 (×4): qty 1

## 2020-02-27 MED ORDER — GLIMEPIRIDE 1 MG PO TABS
1.0000 mg | ORAL_TABLET | Freq: Every day | ORAL | Status: DC
  Administered 2020-02-28 – 2020-03-02 (×4): 1 mg via ORAL
  Filled 2020-02-27 (×4): qty 1

## 2020-02-27 MED ORDER — PROPOFOL 10 MG/ML IV BOLUS
INTRAVENOUS | Status: AC
  Filled 2020-02-27: qty 20

## 2020-02-27 MED ORDER — BISACODYL 10 MG RE SUPP: 10.0000 mg | Freq: Every day | RECTAL | Status: DC | PRN

## 2020-02-27 MED ORDER — LACTATED RINGERS IV SOLN: INTRAVENOUS | Status: DC

## 2020-02-27 SURGICAL SUPPLY — 58 items
ADH SKN CLS APL DERMABOND .7 (GAUZE/BANDAGES/DRESSINGS) ×8
AGENT HMST SPONGE THK3/8 (HEMOSTASIS)
BANDAGE ESMARK 6X9 LF (GAUZE/BANDAGES/DRESSINGS) IMPLANT
BLADE CLIPPER SENSICLIP SURGIC (BLADE) ×3 IMPLANT
BNDG CMPR 9X6 STRL LF SNTH (GAUZE/BANDAGES/DRESSINGS)
BNDG ESMARK 6X9 LF (GAUZE/BANDAGES/DRESSINGS)
CANISTER SUCT 3000ML PPV (MISCELLANEOUS) ×3 IMPLANT
CANNULA VESSEL 3MM 2 BLNT TIP (CANNULA) ×6 IMPLANT
CLIP VESOCCLUDE MED 24/CT (CLIP) ×3 IMPLANT
CLIP VESOCCLUDE SM WIDE 24/CT (CLIP) ×6 IMPLANT
COVER WAND RF STERILE (DRAPES) ×3 IMPLANT
CUFF TOURN SGL QUICK 24 (TOURNIQUET CUFF)
CUFF TOURN SGL QUICK 34 (TOURNIQUET CUFF)
CUFF TOURN SGL QUICK 42 (TOURNIQUET CUFF) IMPLANT
CUFF TRNQT CYL 24X4X16.5-23 (TOURNIQUET CUFF) IMPLANT
CUFF TRNQT CYL 34X4.125X (TOURNIQUET CUFF) IMPLANT
DERMABOND ADVANCED (GAUZE/BANDAGES/DRESSINGS) ×4
DERMABOND ADVANCED .7 DNX12 (GAUZE/BANDAGES/DRESSINGS) ×8 IMPLANT
DRAIN HEMOVAC 1/8 X 5 (WOUND CARE) IMPLANT
DRAPE HALF SHEET 40X57 (DRAPES) IMPLANT
DRAPE X-RAY CASS 24X20 (DRAPES) IMPLANT
ELECT REM PT RETURN 9FT ADLT (ELECTROSURGICAL) ×3
ELECTRODE REM PT RTRN 9FT ADLT (ELECTROSURGICAL) ×2 IMPLANT
EVACUATOR SILICONE 100CC (DRAIN) IMPLANT
GLOVE BIO SURGEON STRL SZ7.5 (GLOVE) ×3 IMPLANT
GOWN STRL REUS W/ TWL LRG LVL3 (GOWN DISPOSABLE) ×6 IMPLANT
GOWN STRL REUS W/TWL LRG LVL3 (GOWN DISPOSABLE) ×9
HEMOSTAT SPONGE AVITENE ULTRA (HEMOSTASIS) IMPLANT
KIT BASIN OR (CUSTOM PROCEDURE TRAY) ×3 IMPLANT
KIT TURNOVER KIT B (KITS) ×3 IMPLANT
LOOP VESSEL MAXI BLUE (MISCELLANEOUS) ×3 IMPLANT
LOOP VESSEL MINI RED (MISCELLANEOUS) ×3 IMPLANT
NS IRRIG 1000ML POUR BTL (IV SOLUTION) ×6 IMPLANT
PACK PERIPHERAL VASCULAR (CUSTOM PROCEDURE TRAY) ×3 IMPLANT
PAD ARMBOARD 7.5X6 YLW CONV (MISCELLANEOUS) ×3 IMPLANT
PENCIL BUTTON HOLSTER BLD 10FT (ELECTRODE) ×3 IMPLANT
SET COLLECT BLD 21X3/4 12 (NEEDLE) IMPLANT
STOPCOCK 4 WAY LG BORE MALE ST (IV SETS) IMPLANT
SUT ETHILON 3 0 PS 1 (SUTURE) IMPLANT
SUT PROLENE 5 0 C 1 24 (SUTURE) ×6 IMPLANT
SUT PROLENE 6 0 CC (SUTURE) ×12 IMPLANT
SUT PROLENE 7 0 BV 1 (SUTURE) IMPLANT
SUT PROLENE 7 0 BV1 MDA (SUTURE) ×3 IMPLANT
SUT SILK 2 0 SH (SUTURE) ×3 IMPLANT
SUT SILK 3 0 (SUTURE) ×6
SUT SILK 3-0 18XBRD TIE 12 (SUTURE) ×4 IMPLANT
SUT VIC AB 2-0 SH 27 (SUTURE) ×6
SUT VIC AB 2-0 SH 27XBRD (SUTURE) ×4 IMPLANT
SUT VIC AB 3-0 SH 27 (SUTURE) ×18
SUT VIC AB 3-0 SH 27X BRD (SUTURE) ×12 IMPLANT
SUT VIC AB 4-0 PS2 27 (SUTURE) ×6 IMPLANT
TAPE UMBILICAL COTTON 1/8X30 (MISCELLANEOUS) ×3 IMPLANT
TOWEL GREEN STERILE (TOWEL DISPOSABLE) ×3 IMPLANT
TRAY FOLEY MTR SLVR 16FR STAT (SET/KITS/TRAYS/PACK) ×3 IMPLANT
TUBE CONNECTING 20X1/4 (TUBING) ×3 IMPLANT
TUBING EXTENTION W/L.L. (IV SETS) IMPLANT
UNDERPAD 30X36 HEAVY ABSORB (UNDERPADS AND DIAPERS) ×3 IMPLANT
WATER STERILE IRR 1000ML POUR (IV SOLUTION) ×3 IMPLANT

## 2020-02-27 NOTE — Transfer of Care (Signed)
Immediate Anesthesia Transfer of Care Note  Patient: Matthew [name]  Procedure(s) Performed: LEFT FEMORAL-ABOVE KNEE POPLITEAL BYPASS WITH NON REVERSED GREATER SAPHENOUS VEIN (Left Leg Upper) LEFT COMMON FEMORAL ENDARTERECTOMY (Left Groin)  Patient Location: PACU  Anesthesia Type:General  Level of Consciousness: drowsy, patient cooperative and responds to stimulation  Airway & Oxygen Therapy: Patient Spontanous Breathing and Patient connected to nasal cannula oxygen  Post-op Assessment: Report given to RN and Post -op Vital signs reviewed and stable  Post vital signs: Reviewed and stable  Last Vitals:  Vitals Value Taken Time  BP 193/80 02/27/20 1206  Temp 97.5   Pulse 92   Resp 12 02/27/20 1207  SpO2 98   Vitals shown include unvalidated device data.  Last Pain:  Vitals:   02/27/20 0640  TempSrc:   PainSc: 7       Patients Stated Pain Goal: 3 (14/48/18 5631)  Complications: No complications documented.

## 2020-02-27 NOTE — Interval H&P Note (Signed)
History and Physical Interval Note:  02/27/2020 7:32 AM  Matthew [name]  has presented today for surgery, with the diagnosis of [place].  The various methods of treatment have been discussed with the patient and family. After consideration of risks, benefits and other options for treatment, the patient has consented to  Procedure(s): LEFT [place] (Left) LEFT COMMON FEMORAL ENDARTERECTOMY (Left) as a surgical intervention.  The patient's history has been reviewed, patient examined, no change in status, stable for surgery.  I have reviewed the patient's chart and labs.  Questions were answered to the patient's satisfaction.     Ruta Hinds

## 2020-02-27 NOTE — Progress Notes (Signed)
Patient arrived to 4E room 25 at this time. Telemetry applied and CCMD notified. Left groin and leg incisions clean dry and intact. V/s and assessment complete.  Patient oriented to room and how to call nurse with any needs. Will continue to monitor.

## 2020-02-27 NOTE — Anesthesia Procedure Notes (Signed)
Procedure Name: Intubation Date/Time: 02/27/2020 7:48 AM Performed by: Kaile Bixler T, CRNA Pre-anesthesia Checklist: Patient identified, Emergency Drugs available, Suction available and Patient being monitored Patient Re-evaluated:Patient Re-evaluated prior to induction Oxygen Delivery Method: Circle system utilized Preoxygenation: Pre-oxygenation with 100% oxygen Induction Type: IV induction Ventilation: Mask ventilation without difficulty Laryngoscope Size: Miller and 3 Grade View: Grade I Tube type: Oral Tube size: 8.0 mm Number of attempts: 2 Airway Equipment and Method: Stylet and Oral airway Placement Confirmation: ETT inserted through vocal cords under direct vision,  positive ETCO2 and breath sounds checked- equal and bilateral Secured at: 23 cm Tube secured with: Tape Dental Injury: Teeth and Oropharynx as per pre-operative assessment

## 2020-02-27 NOTE — Anesthesia Postprocedure Evaluation (Signed)
Anesthesia Post Note  Patient: Matthew [name]  Procedure(s) Performed: LEFT FEMORAL-ABOVE KNEE POPLITEAL BYPASS WITH NON REVERSED GREATER SAPHENOUS VEIN (Left Leg Upper) LEFT COMMON FEMORAL ENDARTERECTOMY (Left Groin)     Patient location during evaluation: PACU Anesthesia Type: General Level of consciousness: patient cooperative, oriented and sedated Pain management: pain level controlled Vital Signs Assessment: post-procedure vital signs reviewed and stable Respiratory status: spontaneous breathing, nonlabored ventilation, respiratory function stable and patient connected to nasal cannula oxygen Cardiovascular status: blood pressure returned to baseline and stable Postop Assessment: no apparent nausea or vomiting Anesthetic complications: no   No complications documented.  Last Vitals:  Vitals:   02/27/20 1600 02/27/20 1704  BP: 124/65 (!) 125/100  Pulse:  70  Resp: 13   Temp:    SpO2: 100%     Last Pain:  Vitals:   02/27/20 1453  TempSrc: Oral  PainSc:                  Nyle Limb,E. Elsie Baynes

## 2020-02-27 NOTE — Plan of Care (Signed)
Continue to monitor

## 2020-02-27 NOTE — Op Note (Signed)
Procedure: Left common femoral endarterectomy, left femoral to above-knee popliteal bypass nonreversed ipsilateral greater saphenous vein  Preoperative diagnosis: Claudication left leg  Postoperative diagnosis: Same  Anesthesia: General  Assistant: Gae Gallop, MD, Leontine Locket, PA-C  Assistant was necessary to expedite procedure and creation of proximal and distal anastomosis  Operative findings: 1.  3 mm greater saphenous vein  Operative details: After pain informed consent, the patient was taken to the operating room.  The patient was placed in supine position operating table.  After induction general anesthesia endotracheal intubation a Foley catheter was placed.  Next patient was prepped and draped in usual sterile fashion from the groin to the foot on the left leg.  Longitudinal incision was made in the left groin carried on through the subtenons tissues down the left common femoral artery.  This was heavily calcified.  There was a pulse more proximally at the level of the inguinal ligament.  I dissected the artery out free circumferentially just underneath the inguinal ligament as well as the circumflex iliac branches.  Dissection was then carried down the femoral bifurcation.  The profunda femoris and superficial femoral arteries were dissected free circumferentially and Vesseloops placed around these.  Attention was then turned to the medial aspect of the incision greater saphenous vein was harvested through several skip incisions down the inner aspect of the left thigh.  The vein was of good quality about 3 mm diameter.  Dissection of the vein was carried down to about 4 cm below the knee joint in order to have some extra vein for potential vein patch.  It was ligated distally with 2-0 silk tie and transected.  The saphenofemoral junction was oversewn with a running 5-0 Prolene suture.  The incision just above the knee was deepened down into the above-knee popliteal space.  The sartorial  muscle was reflected posteriorly.  The above-knee popliteal artery was dissected free circumferentially and a vessel loop placed around it.  It was fairly thickened but soft on palpation.  Next a tunnel was created connecting the above-knee popliteal incision to the groin incision which was subcutaneous in nature.  The patient was given 9000 units of intravenous heparin.  After 2 minutes of circulation time the common femoral artery was controlled proximally with a Henley clamp and distally with fine bulldog clamps on the profunda and superficial femoral artery.  A longitudinal opening was made in the common femoral artery.  There was a large amount of calcified plaque nearly obstructing the lumen.  Endarterectomy was begun in a suitable plane and a good proximal endpoint was obtained.  A good distal endpoint was obtained down into the origin of the superficial femoral artery.  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  Profunda femoris had a good distal endpoint.  Next the vein was brought up in the operative field and placed in a nonreversed configuration.  It was then spatulated to fit the arteriotomy for the endarterectomy.  It was then sewn end of vein to side of artery using a running 6-0 Prolene suture.  Despite completion anastomosis it was for blood backbled and thoroughly flushed reanastomosed was secured clamps released was pulsatile flow in the graft immediately.  A couple repair stitches were placed at the proximal anastomosis to achieve hemostasis.  Next the valvulotome was passed through the vein graft and all the valves were completely lysed.  There was good pulsatile flow flow through the vein.  It was then marked for orientation and brought through  the tunnel.  The above-knee popliteal artery was then controlled proximally distally with bulldog clamps.  Longitudinal opening was made in the above-knee popliteal artery the vein was cut to length spatulated and sewn end of  vein to side of artery using a running 6-0 Prolene suture.  This prior to completion of the anastomosis it was for blood backbled and thoroughly flushed reanastomosed was secured clamps released there is pulsatile flow in the graft and above-knee popliteal artery immediately.  There was good Doppler flow to the dorsalis Salas pedis area of the foot but more prominent at the posterior tibial prominent area of the foot.  Flow augmented almost 100% with unclamping of the graft.  Hemostasis was obtained with administration of 50 mg of protamine and direct pressure.  All the incisions were then closed in multiple layers of running 3-0 Vicryl suture followed by 4-0 Vicryl subcuticular stitch in the skin.  Patient tired procedure well and there were no complications.  Instrument sponge and needle count was correct the end of the case.  Patient was taken to the recovery room stable condition.  Ruta Hinds, MD [facility] Office: [phone]

## 2020-02-27 NOTE — Progress Notes (Signed)
  Day of Surgery Note    Subjective:  No complaints in recovery   Vitals:   02/27/20 0604 02/27/20 1205  BP: (!) 167/58   Pulse: 91   Resp: 18   Temp: 98.9 F (37.2 C) (P) 98.6 F (37 C)  SpO2: 98% (P) 98%    Incisions:   All incisions look good Extremities:  Brisk doppler signals left DP/PT/peroneal Cardiac:  regular Lungs:  Non labored Neuro:  In tact; moving all extremities equally; alert to current year but not president.    Assessment/Plan:  This is a 79 y.o. male who is s/p  Left femoral to above knee popliteal bypass grafting  -pt doing well in recovery with brisk doppler signals left foot -neuro in tact and alert to year, but not president.  Could be due to his age and anesthesia.  Will give him some time. -DM coordinator to help with post op management -to [place] later today   Leontine Locket, PA-C 02/27/2020 12:38 PM [phone]

## 2020-02-27 NOTE — Discharge Instructions (Signed)
 [facility]  Discharge instructions  Lower Extremity Bypass Surgery  Please refer to the following instruction for your post-procedure care. Your surgeon or physician assistant will discuss any changes with you.  Activity  You are encouraged to walk as much as you can. You can slowly return to normal activities during the month after your surgery. Avoid strenuous activity and heavy lifting until your doctor tells you it's OK. Avoid activities such as vacuuming or swinging a golf club. Do not drive until your doctor give the OK and you are no longer taking prescription pain medications. It is also normal to have difficulty with sleep habits, eating and bowel movement after surgery. These will go away with time.  Bathing/Showering  Shower daily after you go home. Do not soak in a bathtub, hot tub, or swim until the incision heals completely.  Incision Care  Clean your incision with mild soap and water. Shower every day. Pat the area dry with a clean towel. You do not need a bandage unless otherwise instructed. Do not apply any ointments or creams to your incision. If you have open wounds you will be instructed how to care for them or a visiting nurse may be arranged for you. If you have staples or sutures along your incision they will be removed at your post-op appointment. You may have skin glue on your incision. Do not peel it off. It will come off on its own in about one week.  Wash the groin wound with soap and water daily and pat dry. (No tub bath-only shower)  Then put a dry gauze or washcloth in the groin to keep this area dry to help prevent wound infection.  Do this daily and as needed.  Do not use Vaseline or neosporin on your incisions.  Only use soap and water on your incisions and then protect and keep dry.  Diet  Resume your normal diet. There are no special food restrictions following this procedure. A low fat/ low cholesterol diet is  recommended for all patients with vascular disease. In order to heal from your surgery, it is CRITICAL to get adequate nutrition. Your body requires vitamins, minerals, and protein. Vegetables are the best source of vitamins and minerals. Vegetables also provide the perfect balance of protein. Processed food has little nutritional value, so try to avoid this.  Medications  Resume taking all your medications unless your doctor or physician assistant tells you not to. If your incision is causing pain, you may take over-the-counter pain relievers such as acetaminophen (Tylenol). If you were prescribed a stronger pain medication, please aware these medication can cause nausea and constipation. Prevent nausea by taking the medication with a snack or meal. Avoid constipation by drinking plenty of fluids and eating foods with high amount of fiber, such as fruits, vegetables, and grains. Take Colace 100 mg (an over-the-counter stool softener) twice a day as needed for constipation.  Do not take Tylenol if you are taking prescription pain medications.  Follow Up  Our office will schedule a follow up appointment 2-3 weeks following discharge.  Please call us immediately for any of the following conditions  Severe or worsening pain in your legs or feet while at rest or while walking Increase pain, redness, warmth, or drainage (pus) from your incision site(s) Fever of 101 degree or higher The swelling in your leg with the bypass suddenly worsens and becomes more painful than when you were in the hospital If you have   been instructed to feel your graft pulse then you should do so every day. If you can no longer feel this pulse, call the office immediately. Not all patients are given this instruction.  Leg swelling is common after leg bypass surgery.  The swelling should improve over a few months following surgery. To improve the swelling, you may elevate your legs above the level of your heart while you are  sitting or resting. Your surgeon or physician assistant may ask you to apply an ACE wrap or wear compression (TED) stockings to help to reduce swelling.  Reduce your risk of vascular disease  Stop smoking. If you would like help call [facility] at 1-800-QUIT-NOW (1-800-784-8669) or [place] at 336-586-4000.  Manage your cholesterol Maintain a desired weight Control your diabetes weight Control your diabetes Keep your blood pressure down  If you have any questions, please call the office at [phone]  

## 2020-02-28 ENCOUNTER — Encounter (HOSPITAL_COMMUNITY): Payer: Self-pay | Admitting: Vascular Surgery

## 2020-02-28 ENCOUNTER — Inpatient Hospital Stay (HOSPITAL_COMMUNITY): Payer: Medicare Other

## 2020-02-28 DIAGNOSIS — I739 Peripheral vascular disease, unspecified: Secondary | ICD-10-CM

## 2020-02-28 LAB — BASIC METABOLIC PANEL
Anion gap: 10 (ref 5–15)
BUN: 32 mg/dL — ABNORMAL HIGH (ref 8–23)
CO2: 26 mmol/L (ref 22–32)
Calcium: 8.7 mg/dL — ABNORMAL LOW (ref 8.9–10.3)
Chloride: 96 mmol/L — ABNORMAL LOW (ref 98–111)
Creatinine, Ser: 1.97 mg/dL — ABNORMAL HIGH (ref 0.61–1.24)
GFR calc Af Amer: 37 mL/min — ABNORMAL LOW (ref >60)
GFR calc non Af Amer: 32 mL/min — ABNORMAL LOW (ref >60)
Glucose, Bld: 233 mg/dL — ABNORMAL HIGH (ref 70–99)
Potassium: 4.5 mmol/L (ref 3.5–5.1)
Sodium: 132 mmol/L — ABNORMAL LOW (ref 135–145)

## 2020-02-28 LAB — LIPID PANEL
Cholesterol: 108 mg/dL (ref 0–200)
HDL: 32 mg/dL — ABNORMAL LOW (ref >40)
LDL Cholesterol: 63 mg/dL (ref 0–99)
Total CHOL/HDL Ratio: 3.4 RATIO
Triglycerides: 65 mg/dL (ref <150)
VLDL: 13 mg/dL (ref 0–40)

## 2020-02-28 LAB — CBC
HCT: 30.3 % — ABNORMAL LOW (ref 39.0–52.0)
Hemoglobin: 9.6 g/dL — ABNORMAL LOW (ref 13.0–17.0)
MCH: 27.8 pg (ref 26.0–34.0)
MCHC: 31.7 g/dL (ref 30.0–36.0)
MCV: 87.8 fL (ref 80.0–100.0)
Platelets: 182 10*3/uL (ref 150–400)
RBC: 3.45 MIL/uL — ABNORMAL LOW (ref 4.22–5.81)
RDW: 13.5 % (ref 11.5–15.5)
WBC: 6.6 10*3/uL (ref 4.0–10.5)
nRBC: 0 % (ref 0.0–0.2)

## 2020-02-28 LAB — GLUCOSE, CAPILLARY
Glucose-Capillary: 216 mg/dL — ABNORMAL HIGH (ref 70–99)
Glucose-Capillary: 108 mg/dL — ABNORMAL HIGH (ref 70–99)
Glucose-Capillary: 119 mg/dL — ABNORMAL HIGH (ref 70–99)
Glucose-Capillary: 265 mg/dL — ABNORMAL HIGH (ref 70–99)

## 2020-02-28 NOTE — Progress Notes (Signed)
Inpatient Diabetes Program Recommendations  AACE/ADA: New Consensus Statement on Inpatient Glycemic Control (2015)  Target Ranges:  Prepandial:   less than 140 mg/dL      Peak postprandial:   less than 180 mg/dL (1-2 hours)      Critically ill patients:  140 - 180 mg/dL   Results for Matthew [name], Matthew [name] (MRN [id number]) as of 02/28/2020 07:59  Ref. Range 02/27/2020 06:08 02/27/2020 12:15 02/27/2020 16:32  Glucose-Capillary Latest Ref Range: 70 - 99 mg/dL 269 (H)  5 mg Decadron given at 8am 213 (H) 228 (H)  5 units NOVOLOG    Results for Matthew [name], Matthew [name] (MRN [id number]) as of 02/28/2020 10:52  Ref. Range 02/28/2020 08:29  Glucose-Capillary Latest Ref Range: 70 - 99 mg/dL 216 (H)    Admit for Left common femoral endarterectomy, left femoral to above-knee popliteal bypass   History: DM  Home DM Meds: Amaryl 1 mg Daily      Actos 30 mg Daily      70/30 Insulin 22 units AM/ 20 units PM  Current Orders: Novolog Moderate Correction Scale/ SSI (0-15 units) TID AC      Amaryl 1 mg Daily      Actos 30 mg Daily     MD- Note home oral DM meds to start this AM.  Patient takes 70/30 Insulin at home--Recommend we switch to Levemir in hospital and pt can resume his 70/30 Insulin when he goes home  Recommend starting Levemir 15 units Daily (0.2 units/kg)  Please start this AM    --Will follow patient during hospitalization--  Wyn Quaker RN, MSN, CDE Diabetes Coordinator Inpatient Glycemic Control Team Team Pager: [phone] (8a-5p)

## 2020-02-28 NOTE — Progress Notes (Addendum)
  Progress Note    02/28/2020 7:03 AM 1 Day Post-Op  Subjective:  Says his left foot feels better and eager to get the right one done.  He says he can move the right one better since surgery.   He says his wife told him yesterday the anesthesia was still on board based on answers he was giving her.   Afebrile HR 50's-70's  572'I-203'T systolic 597% RA  Vitals:   02/28/20 0600 02/28/20 0659  BP:  (!) 120/52  Pulse:    Resp: 12   Temp:    SpO2: 100%     Physical Exam: Cardiac:  regular Lungs:  Non labored Incisions:  All incisions look good Extremities:  Palpable left DP pulse; left foot is warm and well perfused.    CBC    Component Value Date/Time   WBC 6.6 02/28/2020 0225   RBC 3.45 (L) 02/28/2020 0225   HGB 9.6 (L) 02/28/2020 0225   HGB 15.1 03/08/2008 0941   HCT 30.3 (L) 02/28/2020 0225   HCT 45.1 03/08/2008 0941   PLT 182 02/28/2020 0225   PLT 175 03/08/2008 0941   MCV 87.8 02/28/2020 0225   MCV 86.9 03/08/2008 0941   MCH 27.8 02/28/2020 0225   MCHC 31.7 02/28/2020 0225   RDW 13.5 02/28/2020 0225   RDW 13.3 03/08/2008 0941   LYMPHSABS 926 11/24/2019 1603   LYMPHSABS 0.8 (L) 03/08/2008 0941   MONOABS 0.3 11/18/2016 1110   MONOABS 0.3 03/08/2008 0941   EOSABS 139 11/24/2019 1603   EOSABS 0.2 03/08/2008 0941   BASOSABS 13 11/24/2019 1603   BASOSABS 0.0 03/08/2008 0941    BMET    Component Value Date/Time   NA 132 (L) 02/28/2020 0225   NA 141 02/22/2019 0000   K 4.5 02/28/2020 0225   CL 96 (L) 02/28/2020 0225   CO2 26 02/28/2020 0225   GLUCOSE 233 (H) 02/28/2020 0225   BUN 32 (H) 02/28/2020 0225   BUN 22 (A) 02/22/2019 0000   CREATININE 1.97 (H) 02/28/2020 0225   CALCIUM 8.7 (L) 02/28/2020 0225   GFRNONAA 32 (L) 02/28/2020 0225   GFRAA 37 (L) 02/28/2020 0225    INR    Component Value Date/Time   INR 1.2 02/24/2020 1325     Intake/Output Summary (Last 24 hours) at 02/28/2020 0703 Last data filed at 02/28/2020 4163 Gross per 24 hour    Intake 100 ml  Output 1575 ml  Net -1475 ml     Assessment:  79 y.o. male is s/p:  Left common femoral endarterectomy, left femoral to above-knee popliteal bypass nonreversed ipsilateral greater saphenous vein  1 Day Post-Op  Plan: -patent bypass with palpable left DP pulse -CKD slightly worse this am with creatinine of 1.97 up from 1.73 post op yesterday.   Continue IVF.  Will dc ACEI for now. -pt with mild confusion in recovery yesterday-this is much improved this morning.  -increase mobilization and OOB -DVT prophylaxis:  Sq heparin to start this afternoon.   Leontine Locket, PA-C [facility] [phone] 02/28/2020 7:03 AM  Agree with above 2+ DP Incisions healing Home next 1-2 days  Ruta Hinds, MD [facility] Office: [phone]

## 2020-02-28 NOTE — Evaluation (Signed)
Occupational Therapy Evaluation Patient Details Name: Matthew [name] MRN: [id number] DOB: Jul 09, 1940 Today's Date: 02/28/2020    History of Present Illness Patient is a 79 y/o male who presents s/p Left common femoral endarterectomy, left femoral to above-knee popliteal bypass nonreversed ipsilateral greater saphenous vein 8/30. PMH includes MI, HTN, HLD, DM, Raynaud's, CAD.   Clinical Impression   Patient is s/p L common femoral endarterectomy bypass nonreversed ipsilateral greater saphenous vein surgery resulting in functional limitations due to the deficits listed below (see OT problem list). Pt currently with balance deficits affecting all adls. Patient will benefit from skilled OT acutely to increase independence and safety with ADLS to allow discharge hhot.     Follow Up Recommendations  Home health OT    Equipment Recommendations  Other (comment);3 in 1 bedside commode (RW)    Recommendations for Other Services       Precautions / Restrictions Precautions Precautions: Fall      Mobility Bed Mobility Overal bed mobility: Modified Independent             General bed mobility comments: elevated bed to simulate home bed.   Transfers Overall transfer level: Needs assistance Equipment used: Rolling walker (2 wheeled) Transfers: Sit to/from Stand Sit to Stand: Min guard         General transfer comment: cues for hand placement and RW    Balance Overall balance assessment: Needs assistance         Standing balance support: Bilateral upper extremity supported;During functional activity Standing balance-Leahy Scale: Poor Standing balance comment: R hand painful due to IV placement in wrist                           ADL either performed or assessed with clinical judgement   ADL Overall ADL's : Needs assistance/impaired     Grooming: Wash/dry hands;Min guard;Standing               Lower Body Dressing: Moderate assistance Lower Body  Dressing Details (indicate cue type and reason): able to dress R LE with figure 4 cross. pt is unable to cross L LE and will need reach and sock aide education Toilet Transfer: Education administrator Details (indicate cue type and reason): static standing with RW Toileting- Clothing Manipulation and Hygiene: Min guard       Functional mobility during ADLs: Min guard;Rolling walker General ADL Comments: pt requires cues for safety with RW. pt attempting to push it too far ahead of not position it properly to stay inside the RW. Min cues throughotu session for RW safety      Vision Baseline Vision/History: Wears glasses Wears Glasses: At all times       Perception     Praxis      Pertinent Vitals/Pain Pain Assessment: Faces Faces Pain Scale: Hurts a little bit Pain Location: left groin, incisions Pain Descriptors / Indicators: Sore;Tender;Guarding Pain Intervention(s): Monitored during session;Premedicated before session;Repositioned     Hand Dominance Right   Extremity/Trunk Assessment Upper Extremity Assessment Upper Extremity Assessment: Overall WFL for tasks assessed   Lower Extremity Assessment Lower Extremity Assessment: RLE deficits/detail RLE Deficits / Details: edema noted in foot 2 pitting LLE Deficits / Details: drainage noted from thigh incision bright red   Cervical / Trunk Assessment Cervical / Trunk Assessment: Normal   Communication Communication Communication: No difficulties   Cognition Arousal/Alertness: Awake/alert Behavior During Therapy: WFL for tasks assessed/performed Overall Cognitive Status: Within Functional  Limits for tasks assessed                                     General Comments  drainage from thigh left     Exercises     Shoulder Instructions      Home Living Family/patient expects to be discharged to:: Private residence Living Arrangements: Spouse/significant other Available Help at  Discharge: Family;Available 24 hours/day Type of Home: House Home Access: Stairs to enter [employer] of Steps: 2 vs 3 Entrance Stairs-Rails: Can reach both;Left;Right Home Layout: One level     Bathroom Shower/Tub: Teacher, early years/pre: Handicapped height     Home Equipment: Environmental consultant - 2 wheels;Wheelchair - manual;Crutches;Grab bars - tub/shower (wife reports grab bar on tub is loose)          Prior Functioning/Environment Level of Independence: Independent with assistive device(s)  Gait / Transfers Assistance Needed: Has been using w/c for mobility, independent with transfers. Walks minimally. ADL's / Homemaking Assistance Needed: Independent with ADLs, cooks breakfast.- wife reports tub transfers are scary            OT Problem List: Decreased strength;Decreased activity tolerance;Impaired balance (sitting and/or standing);Decreased safety awareness;Decreased knowledge of use of DME or AE;Decreased knowledge of precautions;Pain      OT Treatment/Interventions: Self-care/ADL training;Therapeutic exercise;Energy conservation;DME and/or AE instruction;Manual therapy;Therapeutic activities;Patient/family education;Balance training    OT Goals(Current goals can be found in the care plan section) Acute Rehab OT Goals Patient Stated Goal: to be to shower easier OT Goal Formulation: With patient/family Time For Goal Achievement: 03/13/20 Potential to Achieve Goals: Good  OT Frequency: Min 2X/week   Barriers to D/C:            Co-evaluation              AM-PAC OT "6 Clicks" Daily Activity     Outcome Measure Help from another person eating meals?: None Help from another person taking care of personal grooming?: A Little Help from another person toileting, which includes using toliet, bedpan, or urinal?: A Little Help from another person bathing (including washing, rinsing, drying)?: A Little Help from another person to put on and taking off  regular upper body clothing?: A Little Help from another person to put on and taking off regular lower body clothing?: A Lot 6 Click Score: 18   End of Session Equipment Utilized During Treatment: Rolling walker Nurse Communication: Mobility status;Precautions  Activity Tolerance: Patient tolerated treatment well Patient left: in bed;with call bell/phone within reach;with family/visitor present  OT Visit Diagnosis: Unsteadiness on feet (R26.81);Muscle weakness (generalized) (M62.81);Pain Pain - Right/Left: Left Pain - part of body: Leg                Time: 9147-8295 OT Time Calculation (min): 31 min Charges:  OT General Charges $OT Visit: 1 Visit OT Evaluation $OT Eval Moderate Complexity: 1 Mod OT Treatments $Self Care/Home Management : 8-22 mins   Brynn, OTR/L  Acute Rehabilitation Services Pager: [phone] Office: [phone] .   Jeri Modena 02/28/2020, 3:43 PM

## 2020-02-28 NOTE — Progress Notes (Signed)
PHARMACIST LIPID MONITORING   Matthew [name] is a 79 y.o. male admitted on 02/27/2020 with peripheral neuropathy.  Pharmacy has been consulted to optimize lipid-lowering therapy with the indication of secondary prevention for clinical ASCVD.  Recent Labs:  Lipid Panel (last 6 months):   Lab Results  Component Value Date   CHOL 108 02/28/2020   TRIG 65 02/28/2020   HDL 32 (L) 02/28/2020   CHOLHDL 3.4 02/28/2020   VLDL 13 02/28/2020   LDLCALC 63 02/28/2020    Hepatic function panel (last 6 months):   Lab Results  Component Value Date   AST 17 02/24/2020   ALT 10 02/24/2020   ALKPHOS 63 02/24/2020   BILITOT 1.0 02/24/2020    SCr (since admission):   Serum creatinine: 1.97 mg/dL (H) 02/28/20 0225 Estimated creatinine clearance: 34.5 mL/min (A)  Current lipid-lowering therapy: Lipitor 40 mg po daily  Recommendation per protocol:  Continue current lipid-lowering therapy.  Follow-up with:  Primary care provider - Marin Olp, MD  Follow-up labs after discharge:    Liver function panel and lipid panel in 8-12 weeks then annually   Tad Moore, PharmD 02/28/2020, 8:04 AM

## 2020-02-28 NOTE — Progress Notes (Signed)
Patient called nurse in for pain in right arm where IV is going.  NS running at 75cc/hr in RFA.   Patient noted with snug fitting robe on R arm.  Pt previously advised approx 10 am not to apply robe on right arm d/t IV difficulties

## 2020-02-28 NOTE — Evaluation (Signed)
Physical Therapy Evaluation Patient Details Name: Matthew [name] MRN: [id number] DOB: [birth year] Today's Date: 02/28/2020   History of Present Illness  Patient is a 79 y/o male who presents s/p Left common femoral endarterectomy, left femoral to above-knee popliteal bypass nonreversed ipsilateral greater saphenous vein 8/30. PMH includes MI, HTN, HLD, DM, Raynaud's, CAD.  Clinical Impression  Patient presents with pain, generalized weakness, impaired balance and impaired mobility s/p above. Pt lives at home with wife and has mainly been using w/c for mobility; independent with transfers. Minimal ambulator at home. Increased time to perform all mobility today. Requires Min A for bed mobility, transfers and gait training with use of RW for support. Encouraged increasing mobility as much as tolerated and there ex of LEs as pt reports tightness. Will follow acutely to maximize independence and mobility prior to return home.    Follow Up Recommendations Home health PT;Supervision for mobility/OOB;Supervision/Assistance - 24 hour    Equipment Recommendations  None recommended by PT    Recommendations for Other Services       Precautions / Restrictions Precautions Precautions: Fall Restrictions Weight Bearing Restrictions: No      Mobility  Bed Mobility Overal bed mobility: Needs Assistance Bed Mobility: Supine to Sit     Supine to sit: Min guard;HOB elevated     General bed mobility comments: Increased time and effort to get to EOB, no assist needed.  Transfers Overall transfer level: Needs assistance Equipment used: Rolling walker (2 wheeled) Transfers: Sit to/from Stand Sit to Stand: Min assist         General transfer comment: Slow to rise, "give me a second" Cues for hand placement/technique, cues for upright posture as pt flexed at hips. Transferred to chair post ambulation.  Ambulation/Gait Ambulation/Gait assistance: Min assist Gait Distance (Feet): 12  Feet Assistive device: Rolling walker (2 wheeled) Gait Pattern/deviations: Step-to pattern;Trunk flexed;Narrow base of support Gait velocity: decreased Gait velocity interpretation: <1.31 ft/sec, indicative of household ambulator General Gait Details: Slow, unsteady gait with cues for upright posture and for RW management. Posterior lean at times. Leaning on walker handles to rest despite cues.  Stairs            Wheelchair Mobility    Modified Rankin (Stroke Patients Only)       Balance Overall balance assessment: Needs assistance Sitting-balance support: Feet supported;No upper extremity supported Sitting balance-Leahy Scale: Fair Sitting balance - Comments: LOB once with scooting EOB due to pain through groin incision   Standing balance support: During functional activity Standing balance-Leahy Scale: Poor Standing balance comment: Requires UE support, posterior lean at times.                             Pertinent Vitals/Pain Pain Assessment: 0-10 Pain Score: 4  Pain Location: left groin, incisions Pain Descriptors / Indicators: Sore;Tender;Guarding Pain Intervention(s): Monitored during session;Repositioned;Premedicated before session    Home Living Family/patient expects to be discharged to:: Private residence Living Arrangements: Spouse/significant other Available Help at Discharge: Family;Available 24 hours/day Type of Home: House Home Access: Stairs to enter Entrance Stairs-Rails: Can reach both;Left;Right Entrance Stairs-Number of Steps: 2 vs 3 Home Layout: One level Home Equipment: Walker - 2 wheels;Wheelchair - manual;Crutches      Prior Function Level of Independence: Independent with assistive device(s)   Gait / Transfers Assistance Needed: Has been using w/c for mobility, independent with transfers. Walks minimally.  ADL's / Homemaking Assistance Needed: Independent with ADLs, cooks  breakfast.  Comments: Wife does not want hime to  walk so he does not fall.     Hand Dominance        Extremity/Trunk Assessment   Upper Extremity Assessment Upper Extremity Assessment: Defer to OT evaluation    Lower Extremity Assessment Lower Extremity Assessment: Generalized weakness;RLE deficits/detail;LLE deficits/detail RLE Deficits / Details: tender to palpation on foot RLE Sensation: WNL LLE Deficits / Details: Grossly ~3/5 throughout LLE Sensation: WNL       Communication   Communication: No difficulties  Cognition Arousal/Alertness: Awake/alert Behavior During Therapy: WFL for tasks assessed/performed Overall Cognitive Status: Within Functional Limits for tasks assessed                                        General Comments General comments (skin integrity, edema, etc.): VSS on RA. Incisions intact.    Exercises     Assessment/Plan    PT Assessment Patient needs continued PT services  PT Problem List Decreased strength;Decreased mobility;Decreased range of motion;Decreased skin integrity;Pain;Decreased balance       PT Treatment Interventions Therapeutic activities;Gait training;Therapeutic exercise;Patient/family education;Balance training;Functional mobility training;Stair training    PT Goals (Current goals can be found in the Care Plan section)  Acute Rehab PT Goals Patient Stated Goal: to be able to walk again and get my other leg done PT Goal Formulation: With patient Time For Goal Achievement: 03/13/20 Potential to Achieve Goals: Good    Frequency Min 3X/week   Barriers to discharge Inaccessible home environment stairs to enter home    Co-evaluation               AM-PAC PT "6 Clicks" Mobility  Outcome Measure Help needed turning from your back to your side while in a flat bed without using bedrails?: A Little Help needed moving from lying on your back to sitting on the side of a flat bed without using bedrails?: A Little Help needed moving to and from a bed to a  chair (including a wheelchair)?: A Little Help needed standing up from a chair using your arms (e.g., wheelchair or bedside chair)?: A Little Help needed to walk in hospital room?: A Little Help needed climbing 3-5 steps with a railing? : A Lot 6 Click Score: 17    End of Session Equipment Utilized During Treatment: Gait belt Activity Tolerance: Patient tolerated treatment well Patient left: in chair;with call bell/phone within reach Nurse Communication: Mobility status PT Visit Diagnosis: Pain;Difficulty in walking, not elsewhere classified (R26.2);Muscle weakness (generalized) (M62.81);Unsteadiness on feet (R26.81) Pain - Right/Left: Left Pain - part of body: Leg    Time: 0737-0823 PT Time Calculation (min) (ACUTE ONLY): 46 min   Charges:   PT Evaluation $PT Eval Moderate Complexity: 1 Mod PT Treatments $Gait Training: 8-22 mins $Therapeutic Activity: 8-22 mins        Marisa Severin, PT, DPT Acute Rehabilitation Services Pager [phone] Office [phone]      Marguarite Arbour A Sabra Heck 02/28/2020, 10:13 AM

## 2020-02-28 NOTE — Progress Notes (Signed)
Mobility Specialist: Progress Note   02/28/20 1613  Mobility  Activity Refused mobility   Pt said he is fatigued from the two session he has already had today. Pt is agreeable to work w/ me tomorrow afternoon.   [facility] Malichi Palardy Mobility Specialist

## 2020-02-28 NOTE — Progress Notes (Signed)
ABI/TBI study completed.   Please see CV Proc for preliminary results.   Matthew [name]

## 2020-02-29 LAB — GLUCOSE, CAPILLARY
Glucose-Capillary: 191 mg/dL — ABNORMAL HIGH (ref 70–99)
Glucose-Capillary: 133 mg/dL — ABNORMAL HIGH (ref 70–99)
Glucose-Capillary: 149 mg/dL — ABNORMAL HIGH (ref 70–99)
Glucose-Capillary: 207 mg/dL — ABNORMAL HIGH (ref 70–99)

## 2020-02-29 LAB — BASIC METABOLIC PANEL
Anion gap: 8 (ref 5–15)
BUN: 37 mg/dL — ABNORMAL HIGH (ref 8–23)
CO2: 24 mmol/L (ref 22–32)
Calcium: 8.6 mg/dL — ABNORMAL LOW (ref 8.9–10.3)
Chloride: 100 mmol/L (ref 98–111)
Creatinine, Ser: 1.82 mg/dL — ABNORMAL HIGH (ref 0.61–1.24)
GFR calc Af Amer: 41 mL/min — ABNORMAL LOW (ref >60)
GFR calc non Af Amer: 35 mL/min — ABNORMAL LOW (ref >60)
Glucose, Bld: 150 mg/dL — ABNORMAL HIGH (ref 70–99)
Potassium: 4 mmol/L (ref 3.5–5.1)
Sodium: 132 mmol/L — ABNORMAL LOW (ref 135–145)

## 2020-02-29 LAB — CBC
HCT: 31 % — ABNORMAL LOW (ref 39.0–52.0)
Hemoglobin: 10.1 g/dL — ABNORMAL LOW (ref 13.0–17.0)
MCH: 28.8 pg (ref 26.0–34.0)
MCHC: 32.6 g/dL (ref 30.0–36.0)
MCV: 88.3 fL (ref 80.0–100.0)
Platelets: 229 10*3/uL (ref 150–400)
RBC: 3.51 MIL/uL — ABNORMAL LOW (ref 4.22–5.81)
RDW: 13.5 % (ref 11.5–15.5)
WBC: 6.3 10*3/uL (ref 4.0–10.5)
nRBC: 0 % (ref 0.0–0.2)

## 2020-02-29 NOTE — Progress Notes (Signed)
Inpatient Diabetes Program Recommendations  AACE/ADA: New Consensus Statement on Inpatient Glycemic Control (2015)  Target Ranges:  Prepandial:   less than 140 mg/dL      Peak postprandial:   less than 180 mg/dL (1-2 hours)      Critically ill patients:  140 - 180 mg/dL    Results for Matthew [name], Matthew [name] (MRN [id number]) as of 02/29/2020 10:04  Ref. Range 02/28/2020 08:29 02/28/2020 11:04 02/28/2020 16:17 02/28/2020 21:28 02/29/2020 06:12  Glucose-Capillary Latest Ref Range: 70 - 99 mg/dL 216 (H) 108 (H) 119 (H) 265 (H) 191 (H)    Admit for Left common femoral endarterectomy, left femoral to above-knee popliteal bypass   History: DM  Home DM Meds: Amaryl 1 mg Daily      Actos 30 mg Daily      70/30 Insulin 22 units AM/ 20 units PM  Current Orders: Novolog Moderate Correction Scale/ SSI (0-15 units) TID AC      Amaryl 1 mg Daily      Actos 30 mg Daily  Decadron 5 mg given 8/30 in the am.   Oral DM meds started yesterday glucose trends lower today watch. Potential d/c tomorrow.   --Will follow patient during hospitalization--  Tama Headings RN, MSN, BC-ADM Inpatient Diabetes Coordinator Team Pager [phone] (8a-5p)

## 2020-02-29 NOTE — Progress Notes (Signed)
OT NOTe  OT attempting session and pt with 8-9 out 10 pain currently. Rn arriving with pain medication and will check back on patient after medication.   Fleeta Emmer, OTR/L  Acute Rehabilitation Services Pager: [phone] Office: [phone] .

## 2020-02-29 NOTE — Progress Notes (Signed)
Physical Therapy Treatment Patient Details Name: Matthew [name] MRN: [id number] DOB: 1940/08/17 Today's Date: 02/29/2020    History of Present Illness Patient is a 79 y/o male who presents s/p Left common femoral endarterectomy, left femoral to above-knee popliteal bypass nonreversed ipsilateral greater saphenous vein 8/30. PMH includes MI, HTN, HLD, DM, Raynaud's, CAD.    PT Comments    Pt supine in bed on entry stating he just worked with OT and his pain is just starting to go down prefers not to get up with therapy. Pt bilateral feet are shiny and swollen. Educated pt on muscle pumps to move fluid from distal LE. Pt agreeable to ankle pumps as they do not increase his pain. Pt continued ankle pumps while pt repositioned pt elevating LE and increasing posterior back support with pillows. Wife present at end of session and encouraged her to continue prompting pt for ankle pumps. At end of session, creasing in skin of toes noted, and pt reports less pressure. PT will follow back tomorrow to progress mobility. Given pt ability today, changing recommendation to SNF, if pain controlled and patient able to ambulate will revisit recommendation.    Follow Up Recommendations  SNF (based on current level of mobility )     Equipment Recommendations  None recommended by PT    Precautions / Restrictions Precautions Precautions: Fall    Mobility  Bed Mobility Overal bed mobility: Needs Assistance Bed Mobility: Rolling Rolling: Min guard     Sit to supine: Min assist   General bed mobility comments: pt reports working with OT and pain too great to get to EOB again, Pt twisted in bed, with back flat and R LE ER, pt able to roll with minguard to his R for placement of pillow to support his back   Transfers Overall transfer level: Needs assistance Equipment used: Rolling walker (2 wheeled) Transfers: Sit to/from Stand Sit to Stand: Min assist         General transfer comment:  deferred      Balance Overall balance assessment: Needs assistance Sitting-balance support: Bilateral upper extremity supported;Feet supported Sitting balance-Leahy Scale: Poor Sitting balance - Comments: dependent on bil UE   Standing balance support: Bilateral upper extremity supported Standing balance-Leahy Scale: Zero Standing balance comment: dependent on RW pt with LOB x3 static standing in front of chair                            Cognition Arousal/Alertness: Awake/alert;Suspect due to medications Behavior During Therapy: [facility] for tasks assessed/performed Overall Cognitive Status: Impaired/Different from baseline Area of Impairment: Safety/judgement;Memory                     Memory: Decreased short-term memory   Safety/Judgement: Decreased awareness of safety;Decreased awareness of deficits     General Comments: requires reeducation on ankle pumps for LE edema reduction       Exercises General Exercises - Lower Extremity Ankle Circles/Pumps: Both;AROM;Supine (constant slow movement during session )    General Comments General comments (skin integrity, edema, etc.): Pt bilateral feet swollen and shiny on entry, educated on muscle pumps and ankle ROM to decrease swelling and pain       Pertinent Vitals/Pain Pain Assessment: 0-10 Pain Score: 7  Pain Location: left groin, incisions Pain Descriptors / Indicators: Sore;Tender;Guarding Pain Intervention(s): Premedicated before session;Monitored during session;Limited activity within patient's tolerance;Repositioned           PT  Goals (current goals can now be found in the care plan section) Acute Rehab PT Goals Patient Stated Goal: to be able to get dressed PT Goal Formulation: With patient Time For Goal Achievement: 03/13/20 Potential to Achieve Goals: Good Progress towards PT goals: Not progressing toward goals - comment (limited by pain )    Frequency    Min 3X/week      PT Plan  Discharge plan needs to be updated    Co-evaluation              AM-PAC PT "6 Clicks" Mobility   Outcome Measure  Help needed turning from your back to your side while in a flat bed without using bedrails?: A Little Help needed moving from lying on your back to sitting on the side of a flat bed without using bedrails?: A Lot Help needed moving to and from a bed to a chair (including a wheelchair)?: A Lot Help needed standing up from a chair using your arms (e.g., wheelchair or bedside chair)?: A Lot Help needed to walk in hospital room?: A Lot Help needed climbing 3-5 steps with a railing? : Total 6 Click Score: 12    End of Session Equipment Utilized During Treatment: Gait belt Activity Tolerance: Patient tolerated treatment well Patient left: in bed;with call bell/phone within reach;with chair alarm set;with family/visitor present Nurse Communication: Other (comment) (slight decrease in pain ) PT Visit Diagnosis: Pain;Difficulty in walking, not elsewhere classified (R26.2);Muscle weakness (generalized) (M62.81);Unsteadiness on feet (R26.81) Pain - Right/Left: Left Pain - part of body: Leg     Time: 4888-9169 PT Time Calculation (min) (ACUTE ONLY): 10 min  Charges:  $Therapeutic Activity: 8-22 mins                     Finbar Nippert B. Migdalia Dk PT, DPT Acute Rehabilitation Services Pager [phone] Office [phone]    [place] 02/29/2020, 4:03 PM

## 2020-02-29 NOTE — Progress Notes (Addendum)
[facility]  Subjective  - Doing a little better with walking.  He has incisional pain that seems to be slowing him down some.     Objective (!) 127/46 66 98.8 F (37.1 C) (Oral) 15 98%  Intake/Output Summary (Last 24 hours) at 02/29/2020 0721 Last data filed at 02/29/2020 0600 Gross per 24 hour  Intake 2597.92 ml  Output 1525 ml  Net 1072.92 ml   Left groin soft without hematoma.  Leg incisions healing well Doppler signals PT/DP/peroneal Heart RRR Lungs non labored breathing  Assessment/Planning: 79 y.o. male is s/p:  Left common femoral endarterectomy, left femoral to above-knee popliteal bypass nonreversed ipsilateral greater saphenous vein  2 Day Post-Op  Patent bypass Cr continues to elevate 1.97 ACE held to increase perfusion  Pending new labs today.  Will hold new changes today until labs reported.  Good urine OP 1,500 + Cont. To increase mobility and pain control.  Possible D/C tomorrow.     Roxy Horseman 02/29/2020 7:21 AM --   Agree with above.  Left foot slightly edematous warm well perfused.  Incisions healing.  Patient will walk in the hallway today.  We will recheck his serum creatinine today.  If these are both improved plan for discharge tomorrow.  Ruta Hinds, MD [facility] [place] Office: [phone]  Laboratory Lab Results: Recent Labs    02/27/20 1620 02/28/20 0225  WBC 7.7 6.6  HGB 10.8* 9.6*  HCT 33.3* 30.3*  PLT 200 182   BMET Recent Labs    02/27/20 1620 02/28/20 0225  NA  --  132*  K  --  4.5  CL  --  96*  CO2  --  26  GLUCOSE  --  233*  BUN  --  32*  CREATININE 1.73* 1.97*  CALCIUM  --  8.7*    COAG Lab Results  Component Value Date   INR 1.2 02/24/2020   No results found for: PTT

## 2020-02-29 NOTE — Progress Notes (Signed)
Mobility Specialist: Progress Note    02/29/20 1342  Mobility  Activity Ambulated in room  Level of Assistance Moderate assist, patient does 50-74%  Assistive Device Front wheel walker  Distance Ambulated (ft) 20 ft  Mobility Response Tolerated fair  Mobility performed by Mobility specialist  Bed Position Chair  $Mobility charge 1 Mobility   Pre-Mobility: 62 HR, 109/50 BP, 93% SpO2 Post-Mobility: 70 HR, 126/55 BP, 100% SpO2  Pt c/o of pain in his L knee/thigh around incision sight he rated 7-8/10. Pt was able to walk around end of bed and back to chair w/o sitting but did take 2 standing rest breaks.   [facility] Matthew [name] Mobility Specialist

## 2020-02-29 NOTE — Progress Notes (Signed)
Occupational Therapy Treatment Patient Details Name: Matthew [name] MRN: [id number] DOB: 03-11-1941 Today's Date: 02/29/2020    History of present illness Patient is a 79 y/o male who presents s/p Left common femoral endarterectomy, left femoral to above-knee popliteal bypass nonreversed ipsilateral greater saphenous vein 8/30. PMH includes MI, HTN, HLD, DM, Raynaud's, CAD.   OT comments  Pt very limited by pain this session and throughout the day. Pt unable to advance more than 2 ft during session before returning to sitting. Pt reports extreme pain with premedication x2 prior to session. Pt noted to have decreased cognition this session question if due to increased IV pain medication used. Pt needed to demonstrate basic transfers safely to d/c home and unable to progress this session so recommendation changed to SNF.    Follow Up Recommendations  SNF (due to progress this session)    Equipment Recommendations  3 in 1 bedside commode;Other (comment) (RW)    Recommendations for Other Services      Precautions / Restrictions Precautions Precautions: Fall       Mobility Bed Mobility Overal bed mobility: Needs Assistance Bed Mobility: Sit to Supine       Sit to supine: Min assist   General bed mobility comments: pt using bil Ue to drag LB back on bed surface with dependence on UB strength. Pt then pivoting BIL LE onto bed surface with (A) to clear L foot. pt needs rest break to then move leg again  Transfers Overall transfer level: Needs assistance Equipment used: Rolling walker (2 wheeled) Transfers: Sit to/from Stand Sit to Stand: Min assist         General transfer comment: cues for hand placement. increased time to sequence. pt pushing up only on R LE. pt with LLE extended    Balance Overall balance assessment: Needs assistance Sitting-balance support: Bilateral upper extremity supported;Feet supported Sitting balance-Leahy Scale: Poor Sitting balance - Comments:  dependent on bil UE   Standing balance support: Bilateral upper extremity supported Standing balance-Leahy Scale: Zero Standing balance comment: dependent on RW pt with LOB x3 static standing in front of chair                           ADL either performed or assessed with clinical judgement   ADL Overall ADL's : Needs assistance/impaired Eating/Feeding: Independent Eating/Feeding Details (indicate cue type and reason): sitting in chair                  Lower Body Dressing: Maximal assistance Lower Body Dressing Details (indicate cue type and reason): pt educated on use of reacher, due to sensitivity unable to even attempt sock aide. Pt unable to use reacher to pull on house shoe. pt states "oh wow i cant even get these on now" pt using reaching to don doff even R side today due to pain Toilet Transfer: Moderate assistance;RW Toilet Transfer Details (indicate cue type and reason): simulated recliner to bed surface         Functional mobility during ADLs: Moderate assistance;Rolling walker General ADL Comments: pt in chair since this AM so ending session in bed to elevate LLE on pillows / blankets. PT reports comfort with leg extended.      Vision       Perception     Praxis      Cognition Arousal/Alertness: Awake/alert;Suspect due to medications Behavior During Therapy: [facility] for tasks assessed/performed Overall Cognitive Status: Impaired/Different from baseline Area of  Impairment: Safety/judgement;Memory                     Memory: Decreased short-term memory   Safety/Judgement: Decreased awareness of safety;Decreased awareness of deficits     General Comments: pt needed cues to sequence task. pt misplacing call bell with it beside hime        Exercises     Shoulder Instructions       General Comments noted to have quarter size dried scab today where pt had drainage of red blood on 8/31 knee L knee incision site    Pertinent Vitals/  Pain       Pain Assessment: 0-10 Pain Score: 8  Pain Location: left groin, incisions Pain Descriptors / Indicators: Sore;Tender;Guarding Pain Intervention(s): Premedicated before session;Repositioned (provided pain medications twice prior to OT session)  Home Living                                          Prior Functioning/Environment              Frequency  Min 2X/week        Progress Toward Goals  OT Goals(current goals can now be found in the care plan section)  Progress towards OT goals: Progressing toward goals  Acute Rehab OT Goals Patient Stated Goal: to be able to get dressed OT Goal Formulation: With patient/family Time For Goal Achievement: 03/13/20 Potential to Achieve Goals: Good ADL Goals Pt Will Perform Lower Body Dressing: with modified independence;sit to/from stand;with adaptive equipment Pt Will Transfer to Toilet: with modified independence;ambulating;bedside commode Pt Will Perform Tub/Shower Transfer: Tub transfer;ambulating;rolling walker  Plan Discharge plan needs to be updated    Co-evaluation                 AM-PAC OT "6 Clicks" Daily Activity     Outcome Measure   Help from another person eating meals?: A Little Help from another person taking care of personal grooming?: A Little Help from another person toileting, which includes using toliet, bedpan, or urinal?: A Lot Help from another person bathing (including washing, rinsing, drying)?: A Lot Help from another person to put on and taking off regular upper body clothing?: A Little Help from another person to put on and taking off regular lower body clothing?: A Lot 6 Click Score: 15    End of Session Equipment Utilized During Treatment: Rolling walker  OT Visit Diagnosis: Unsteadiness on feet (R26.81);Muscle weakness (generalized) (M62.81);Pain Pain - Right/Left: Left Pain - part of body: Leg   Activity Tolerance Patient limited by pain   Patient Left  in bed;with call bell/phone within reach   Nurse Communication Mobility status;Precautions        Time: 6468-0321 (previous session 14 minutes) OT Time Calculation (min): 40 min  Charges: OT General Charges $OT Visit: 1 Visit OT Treatments $Self Care/Home Management : 53-67 mins   Brynn, OTR/L  Acute Rehabilitation Services Pager: [phone] Office: [phone] .    Jeri Modena 02/29/2020, 3:02 PM

## 2020-03-01 LAB — GLUCOSE, CAPILLARY
Glucose-Capillary: 188 mg/dL — ABNORMAL HIGH (ref 70–99)
Glucose-Capillary: 162 mg/dL — ABNORMAL HIGH (ref 70–99)
Glucose-Capillary: 179 mg/dL — ABNORMAL HIGH (ref 70–99)
Glucose-Capillary: 167 mg/dL — ABNORMAL HIGH (ref 70–99)

## 2020-03-01 MED ORDER — OXYCODONE-ACETAMINOPHEN 5-325 MG PO TABS
1.0000 | ORAL_TABLET | Freq: Four times a day (QID) | ORAL | 0 refills | Status: DC | PRN
Start: 2020-03-01 — End: 2020-03-09

## 2020-03-01 NOTE — Progress Notes (Signed)
°  Mobility Specialist: Progress Note    03/01/20 1457  Mobility  Activity Ambulated in hall  Level of Assistance Maximum assist, patient does 25-49%  Assistive Device Front wheel walker  Distance Ambulated (ft) 120 ft  Mobility Response Tolerated well  Mobility performed by Mobility specialist  Bed Position High-fowlers  $Mobility charge 1 Mobility   Pre-Mobility: 61 HR, 129/55 BP, 98% SpO2 Post-Mobility: 68 HR, 138/60 BP, 96% SpO2  Pt was max assist to stand and contact guard once walking. Pt says his biggest struggle is to stand up from the chair and the bed. Pt's wife is concerned about mobility getting in and out of chair and bed and wants to see about getting a lift chair for home. Pt said he felt fine throughout ambulation w/ his pain level only being a 3/10 today.   [facility] Sheelah Ritacco Mobility Specialist

## 2020-03-01 NOTE — Progress Notes (Signed)
Physical Therapy Treatment Patient Details Name: Matthew [name] MRN: [id number] DOB: [birth year] Today's Date: 03/01/2020    History of Present Illness Patient is a 79 y/o male who presents s/p Left common femoral endarterectomy, left femoral to above-knee popliteal bypass nonreversed ipsilateral greater saphenous vein 8/30. PMH includes MI, HTN, HLD, DM, Raynaud's, CAD.    PT Comments    Pt tolerates treatment well with improved activity tolerance and ambulation distance. Pt is still generally weak in BLE and requires significant assistance to transfer from lower surfaces and surfaces without armrests. Pt also ambulates with short step length and reduced foot clearance. Pt remains at an increased risk for falls due to LE weakness and impaired balance during transfers. Pt will continue to benefit from acute PT POC to reduce falls risk and to assess stair negotiation. PT recommends SNF however pt refusing SNF placement. Pt will benefit from HHPT, a RW, and a 3in1 commode, as well as assistance from family for all out of bed mobility.   Follow Up Recommendations  SNF;Home health PT (pt refusing SNF, will need HHPT)     Equipment Recommendations  Rolling walker with 5" wheels;3in1 (PT)    Recommendations for Other Services       Precautions / Restrictions Precautions Precautions: Fall Restrictions Weight Bearing Restrictions: No    Mobility  Bed Mobility Overal bed mobility: Needs Assistance Bed Mobility: Supine to Sit     Supine to sit: Supervision        Transfers Overall transfer level: Needs assistance Equipment used: Rolling walker (2 wheeled) Transfers: Sit to/from Stand Sit to Stand: Min assist;Mod assist         General transfer comment: pt requires min-modA to power up into standing position, PT provides significant cues for technique  Ambulation/Gait Ambulation/Gait assistance: Min guard Gait Distance (Feet): 70 Feet Assistive device: Rolling walker (2  wheeled) Gait Pattern/deviations: Step-to pattern Gait velocity: reduced Gait velocity interpretation: <1.31 ft/sec, indicative of household ambulator General Gait Details: pt with short step to gait, reduced step length bilaterally   Stairs             Wheelchair Mobility    Modified Rankin (Stroke Patients Only)       Balance Overall balance assessment: Needs assistance Sitting-balance support: No upper extremity supported;Feet supported Sitting balance-Leahy Scale: Fair     Standing balance support: Bilateral upper extremity supported Standing balance-Leahy Scale: Poor Standing balance comment: reliant on BUE support and minG, initially needs minA upon standing until becoming steady                            Cognition Arousal/Alertness: Awake/alert Behavior During Therapy: WFL for tasks assessed/performed Overall Cognitive Status: Impaired/Different from baseline Area of Impairment: Memory;Safety/judgement                     Memory: Decreased short-term memory   Safety/Judgement: Decreased awareness of deficits            Exercises      General Comments General comments (skin integrity, edema, etc.): VSS on RA      Pertinent Vitals/Pain Pain Assessment: Faces Faces Pain Scale: Hurts even more Pain Location: LLE Pain Descriptors / Indicators: Grimacing Pain Intervention(s): Monitored during session    Home Living                      Prior Function  PT Goals (current goals can now be found in the care plan section) Acute Rehab PT Goals Patient Stated Goal: to go home Progress towards PT goals: Progressing toward goals    Frequency    Min 3X/week      PT Plan Current plan remains appropriate    Co-evaluation              AM-PAC PT "6 Clicks" Mobility   Outcome Measure  Help needed turning from your back to your side while in a flat bed without using bedrails?: None Help needed  moving from lying on your back to sitting on the side of a flat bed without using bedrails?: None Help needed moving to and from a bed to a chair (including a wheelchair)?: A Lot Help needed standing up from a chair using your arms (e.g., wheelchair or bedside chair)?: A Lot Help needed to walk in hospital room?: A Little Help needed climbing 3-5 steps with a railing? : Total 6 Click Score: 16    End of Session   Activity Tolerance: Patient tolerated treatment well Patient left: in chair;with call bell/phone within reach Nurse Communication: Mobility status PT Visit Diagnosis: Pain;Difficulty in walking, not elsewhere classified (R26.2);Muscle weakness (generalized) (M62.81);Unsteadiness on feet (R26.81) Pain - Right/Left: Left Pain - part of body: Leg     Time: 1011-1055 PT Time Calculation (min) (ACUTE ONLY): 44 min  Charges:  $Gait Training: 23-37 mins $Therapeutic Activity: 8-22 mins                     Zenaida Niece, PT, DPT Acute Rehabilitation Pager: [phone]    Zenaida Niece 03/01/2020, 12:42 PM

## 2020-03-01 NOTE — TOC Progression Note (Signed)
Transition of Care (TOC) - Progression Note  Marvetta Gibbons RN, BSN Transitions of Care Unit 4E- RN Case Manager See Treatment Team for direct phone #    Patient Details  Name: Matthew [name] MRN: [id number] Date of Birth: [birth year]  Transition of Care [facility]) CM/SW Contact  Dahlia Client, Romeo Rabon, RN Phone Number: 03/01/2020, 4:35 PM  Clinical Narrative:    Received msg that pt and wife do not want SNF placement for rehab and are wanting to return home with [facility] services. Call made to wife to discuss transition needs and confirm HH choice- informed wife of pre-op referral to [facility]- reviewed Medicare ratings and offered choice- per wife she is fine using [facility] for [facility] needs- also confirmed DME needs for 3n1 and RW- orders have been placed- wife also had questions about a lift chair which questions were answered about Medicare coverage for lift chair. Wife is fine using in house provider [employer] for DME needs and have delivered to room prior to discharge.  Wife will plan on transporting home  Call made to [place] DME line to request DME for delivery- RW and 3n1 to be delivered to room once processed.  Will notify [facility] of discharge tomorrow.   Expected Discharge Plan: [place] Barriers to Discharge: Continued Medical Work up  Expected Discharge Plan and Services Expected Discharge Plan: [place] In-house Referral: Clinical Social Work Discharge Planning Services: CM Consult Post Acute Care Choice: Home Health, Durable Medical Equipment Living arrangements for the past 2 months: [place]                 DME Arranged: 3-N-1, Walker rolling DME Agency: [facility] Date DME Agency Contacted: 03/01/20 Time DME Agency Contacted: [phone] Representative spoke with at DME Agency: Adela Lank HH Arranged: PT, OT [place] Agency: [facility] [place] Date [place]: 03/02/20   Representative spoke with at [place]: [place] ([place]) Interventions    Readmission Risk Interventions Readmission Risk Prevention Plan 03/01/2020  Transportation Screening Complete  PCP or Specialist Appt within 5-7 Days Complete  Home Care Screening Complete  Medication Review (RN CM) Complete  Some recent data might be hidden

## 2020-03-01 NOTE — TOC Initial Note (Signed)
Transition of Care [facility]) - Initial/Assessment Note    Patient Details  Name: Matthew [name] MRN: [id number] Date of Birth: 02-Dec-1940  Transition of Care [facility]) CM/SW Contact:    Vinie Sill, [place] Phone Number: 03/01/2020, 3:54 PM  Clinical Narrative:                  CSW visit with patient at bedside. CSW introduced self and explain role. CSW discuss with patient PT recommendation of short term rehab at [facility]. Patient declined SNF. Patient states he has his spouse,sister and son, to help support him the home. Patient states he is agreeable to [facility] and has no preferred agency. CSW provided patient with Medicare.gov choice listing with ratings. Patient gave CSW permission to call his wife.  CSW spoke with the patient's wife,Matthew [name] and updated patient's discharge plan. Matthew [name] states she is agreeable to patient coming home and states "he will be just fine at home".  RNCM updated   [name], MSW, LCSWA Clinical Social Worker    Expected Discharge Plan: [place] Barriers to Discharge: Continued Medical Work up   Patient Goals and CMS Choice        Expected Discharge Plan and Services Expected Discharge Plan: [place]                                              Prior Living Arrangements/Services     Patient language and need for interpreter reviewed:: No        Need for Family Participation in Patient Care: Yes (Comment) Care giver support system in place?: Yes (comment)   Criminal Activity/Legal Involvement Pertinent to Current Situation/Hospitalization: No - Comment as needed  Activities of Daily Living      Permission Sought/Granted Permission sought to [name] information with : Family Supports Permission granted to [name] information with : Yes, Verbal Permission Granted  [name] Information with NAME: [place] granted to [name] info w AGENCY: [facility]  Permission granted to [name] info w  Relationship: spouse  Permission granted to [name] info w Contact Information: [phone]  Emotional Assessment Appearance:: Appears stated age Attitude/Demeanor/Rapport: Self-Confident, Engaged Affect (typically observed): Accepting, Pleasant, Appropriate Orientation: : Oriented to Self, Oriented to Place, Oriented to  Time, Oriented to Situation Alcohol / Substance Use: Not Applicable Psych Involvement: No (comment)  Admission diagnosis:  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 Allergic rhinitis 11/18/2016  . Cervical arthritis ([place]) 10/25/2014  . Pulmonary nodule seen on imaging study 05/17/2012  . Erectile dysfunction 08/13/2010  . CKD (chronic kidney disease), stage III 09/15/2008  . PAD (peripheral artery disease) (HCC)-with claudication 05/14/2007  . Diabetes mellitus with renal complications ([place]) 34/91/7915  . Hyperlipidemia associated with type 2 diabetes mellitus ([place]) 12/28/2006  . 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 Hypertension associated with diabetes ([place]) 12/28/2006  .  Coronary artery disease s/p CABG 1997 12/28/2006   PCP:  Marin Olp, MD Pharmacy:   [facility] [address] [place] [address] Phone: [phone] Fax: [phone]  [place], [place] [address] [place] [address] Phone: [phone]  Fax: [phone]     Social Determinants of Health (SDOH) Interventions    Readmission Risk Interventions No flowsheet data found.

## 2020-03-01 NOTE — Progress Notes (Addendum)
[facility] [place]  Subjective  - Very sleepy this am, moving and talking very slow.   Objective (!) 132/50 65 98.9 F (37.2 C) (Oral) 12 94%  Intake/Output Summary (Last 24 hours) at 03/01/2020 0710 Last data filed at 03/01/2020 0600 Gross per 24 hour  Intake 501.45 ml  Output 710 ml  Net -208.55 ml    Left LE doppler signals PT/DP/Peroneal intact, foot warm well perfused, minimal edema. Left groin soft without hematoma, incision intact. Heart RRR Lungs non labored breathing   Assessment/Planning: 79 y.o.maleis s/p:  Left common femoral endarterectomy, left femoral to above-knee popliteal bypass nonreversed ipsilateral greater saphenous vein 3 Day Post-Op  Cr decreased as of 02/28/20 now 1.8.  Urine OP 2,823 past 24 hours Mobility has decreased and PT now recommending SNF.  I discussed this with him and tried to encourage increased mobility.  He is A & O x 3, just very slow.    BP stable with ACE being held.  Will send note to his primary care to f/u on Cr and BP medications as an out patient.    Pending pain control and ambulation I would plan to discharge him [place].  Roxy Horseman 03/01/2020 7:10 AM --  Patent bypass.  Incisions healing.  Pt still struggling with mobility. Will try to ambulate again today if unsuccessful will pursue SNF tomorrow as per PT rec.  Spoke with wife who is in agreement with SNF placement  Ruta Hinds, MD [facility] [place]: [phone]   Laboratory Lab Results: Recent Labs    02/28/20 0225 02/29/20 0753  WBC 6.6 6.3  HGB 9.6* 10.1*  HCT 30.3* 31.0*  PLT 182 229   BMET Recent Labs    02/28/20 0225 02/29/20 0753  NA 132* 132*  K 4.5 4.0  CL 96* 100  CO2 26 24  GLUCOSE 233* 150*  BUN 32* 37*  CREATININE 1.97* 1.82*  CALCIUM 8.7* 8.6*    COAG Lab Results  Component Value Date   INR 1.2 02/24/2020   No results found for: PTT

## 2020-03-02 LAB — GLUCOSE, CAPILLARY
Glucose-Capillary: 153 mg/dL — ABNORMAL HIGH (ref 70–99)
Glucose-Capillary: 189 mg/dL — ABNORMAL HIGH (ref 70–99)

## 2020-03-02 NOTE — Progress Notes (Signed)
Progress Note    03/02/2020 7:28 AM 4 Days Post-Op  Subjective: Sitting up in bed eating breakfast without complaints.  He has been ambulating in the hallways.  Normal postoperative pain.  Denies shortness of breath or chest pain   Vitals:   03/01/20 2329 03/02/20 0445  BP: (!) 134/56 127/66  Pulse: 68 65  Resp: 20 19  Temp: 98.2 F (36.8 C) 98.7 F (37.1 C)  SpO2: 95% 95%    Physical Exam: General appearance: Awake alert in no apparent distress. Cardiac: Heart rate and rhythm are regular Lungs: Nonlabored Incisions: Left groin and lower extremity incisions are all well approximated without bleeding or signs of infection Extremities: Both feet are warm and well perfused.  Motor and sensation intact.  2+ left dorsalis pedis pulse   CBC    Component Value Date/Time   WBC 6.3 02/29/2020 0753   RBC 3.51 (L) 02/29/2020 0753   HGB 10.1 (L) 02/29/2020 0753   HGB 15.1 03/08/2008 0941   HCT 31.0 (L) 02/29/2020 0753   HCT 45.1 03/08/2008 0941   PLT 229 02/29/2020 0753   PLT 175 03/08/2008 0941   MCV 88.3 02/29/2020 0753   MCV 86.9 03/08/2008 0941   MCH 28.8 02/29/2020 0753   MCHC 32.6 02/29/2020 0753   RDW 13.5 02/29/2020 0753   RDW 13.3 03/08/2008 0941   LYMPHSABS 926 11/24/2019 1603   LYMPHSABS 0.8 (L) 03/08/2008 0941   MONOABS 0.3 11/18/2016 1110   MONOABS 0.3 03/08/2008 0941   EOSABS 139 11/24/2019 1603   EOSABS 0.2 03/08/2008 0941   BASOSABS 13 11/24/2019 1603   BASOSABS 0.0 03/08/2008 0941    BMET    Component Value Date/Time   NA 132 (L) 02/29/2020 0753   NA 141 02/22/2019 0000   K 4.0 02/29/2020 0753   CL 100 02/29/2020 0753   CO2 24 02/29/2020 0753   GLUCOSE 150 (H) 02/29/2020 0753   BUN 37 (H) 02/29/2020 0753   BUN 22 (A) 02/22/2019 0000   CREATININE 1.82 (H) 02/29/2020 0753   CALCIUM 8.6 (L) 02/29/2020 0753   GFRNONAA 35 (L) 02/29/2020 0753   GFRAA 41 (L) 02/29/2020 0753     Intake/Output Summary (Last 24 hours) at 03/02/2020 0728 Last data  filed at 03/02/2020 0500 Gross per 24 hour  Intake --  Output 1450 ml  Net -1450 ml    HOSPITAL MEDICATIONS Scheduled Meds: . aspirin EC  81 mg Oral Daily  . atorvastatin  40 mg Oral Daily  . Chlorhexidine Gluconate Cloth  6 each Topical Daily  . chlorthalidone  25 mg Oral Daily  . cholecalciferol  1,000 Units Oral Daily  . docusate sodium  100 mg Oral Daily  . gabapentin  300 mg Oral TID  . glimepiride  1 mg Oral Q breakfast  . heparin  5,000 Units Subcutaneous Q8H  . insulin aspart  0-15 Units Subcutaneous TID WC  . loratadine  10 mg Oral Daily  . metoprolol succinate  100 mg Oral Daily  . NIFEdipine  30 mg Oral BID  . pantoprazole  40 mg Oral Daily  . pioglitazone  30 mg Oral Daily   Continuous Infusions: . sodium chloride    . sodium chloride Stopped (02/29/20 1108)  . magnesium sulfate bolus IVPB     PRN Meds:.sodium chloride, acetaminophen **OR** acetaminophen, azelastine, bisacodyl, colchicine, cyclobenzaprine, fluticasone, guaiFENesin-dextromethorphan, hydrALAZINE, HYDROmorphone (DILAUDID) injection, labetalol, magnesium sulfate bolus IVPB, metoprolol tartrate, ondansetron, oxyCODONE-acetaminophen, oxymetazoline, phenol, polyethylene glycol, potassium chloride  Assessment: Postoperative day 4  left common femoral endarterectomy with left femoral to above-knee popliteal bypass with vein.  Patent bypass.  Palpable left dorsalis pedis pulse.  Vital signs stable.  Hemoglobin stable. Acute kidney injury with elevation of creatinine.  Continues downward trend.  Excellent urine output. Transition of care team notes reviewed.  Patient and patient's wife declined referral to skilled nursing facility.  [facility]ang[facility] and provisions for durable medical equipment.     Plan: -Discharge later this morning once DME available.  Continue aspirin and statin.  ACE inhibitor held. -DVT prophylaxis: Heparin subcutaneously   Risa Grill, PA-C [facility] [phone] 03/02/2020  7:28 AM

## 2020-03-02 NOTE — Progress Notes (Signed)
Physical Therapy Treatment Patient Details Name: Matthew [name] MRN: [id number] DOB: 1941/05/05 Today's Date: 03/02/2020    History of Present Illness Patient is a 79 y/o male who presents s/p Left common femoral endarterectomy, left femoral to above-knee popliteal bypass nonreversed ipsilateral greater saphenous vein 8/30. PMH includes MI, HTN, HLD, DM, Raynaud's, CAD.    PT Comments    Pt continuing to require mod assist for transition from sit to stand, pt stating that his wife will be able to assist him with this. First stand attempt unsuccessful, requiring quick return to sit with controlled descent assist by PT. PT encouraged pt to take his time with transitional movements at home, and to wait to begin ambulation until sure of steadiness. PT also encouraged wife assist during all mobility, pt agrees to this. Pt successfully completed stair navigation today, with PT cuing for form and safety and close guarding. Pt eager to d/c home to wife, PT encouraging multiple supervised short-distance walks every day to promote LE circulation and improve activity tolerance. will continue to follow acutely.    Follow Up Recommendations  SNF;Home health PT (pt refusing SNF, will need HHPT)     Equipment Recommendations  Rolling walker with 5" wheels;3in1 (PT)    Recommendations for Other Services       Precautions / Restrictions Precautions Precautions: Fall Restrictions Weight Bearing Restrictions: No    Mobility  Bed Mobility Overal bed mobility: Needs Assistance Bed Mobility: Supine to Sit     Supine to sit: Supervision     General bed mobility comments: for safety, PT hovering around LLE in case of need of assist. Increased time and effort to perform.  Transfers Overall transfer level: Needs assistance Equipment used: Rolling walker (2 wheeled) Transfers: Sit to/from Stand Sit to Stand: Mod assist         General transfer comment: Mod assist for power up, weight shifting  support for bringing UEs to RW, and steadying. First stand attempt pt uncontrolled sat back down due to unsteadiness, PT with assist at gait belt to lower. STS x3, from EOB x2 and from recliner x1.  Ambulation/Gait Ambulation/Gait assistance: Min guard Gait Distance (Feet): 35 Feet Assistive device: Rolling walker (2 wheeled) Gait Pattern/deviations: Step-through pattern;Decreased stride length;Antalgic;Decreased weight shift to left Gait velocity: decr   General Gait Details: Min guard for safety, verbal cuing for upright posture, position in RW.   Stairs Stairs: Yes Stairs assistance: Min assist Stair Management: Two rails;Step to pattern;Forwards Number of Stairs: 3 General stair comments: Min assist to steady, verbal cuing for sequencing (up with good leg, down with bad leg leading), placement of caregiver during ascending/descending.   Wheelchair Mobility    Modified Rankin (Stroke Patients Only)       Balance Overall balance assessment: Needs assistance Sitting-balance support: No upper extremity supported;Feet supported Sitting balance-Leahy Scale: Fair     Standing balance support: Bilateral upper extremity supported Standing balance-Leahy Scale: Poor Standing balance comment: reliant on external assist                            Cognition Arousal/Alertness: Awake/alert Behavior During Therapy: WFL for tasks assessed/performed Overall Cognitive Status: Impaired/Different from baseline Area of Impairment: Memory;Safety/judgement                     Memory: Decreased short-term memory   Safety/Judgement: Decreased awareness of deficits     General Comments: pt is at times dismissive  of PT suggestions for mobility, states "that won't work for me" before trying. Pt with decreased awareness of mobility deficits, currently requiring at least mod assist to transition to standing but states he will have no issue mobilizing at home.       Exercises General Exercises - Lower Extremity Ankle Circles/Pumps: AROM;Both;5 reps;Seated Quad Sets: AROM;Left;5 reps;Seated    General Comments General comments (skin integrity, edema, etc.): vss      Pertinent Vitals/Pain Pain Assessment: 0-10 Pain Score: 5  Pain Location: LLE Pain Descriptors / Indicators: Grimacing Pain Intervention(s): Limited activity within patient's tolerance;Monitored during session;Repositioned    Home Living                      Prior Function            PT Goals (current goals can now be found in the care plan section) Acute Rehab PT Goals Patient Stated Goal: to go home PT Goal Formulation: With patient Time For Goal Achievement: 03/13/20 Potential to Achieve Goals: Good Progress towards PT goals: Progressing toward goals    Frequency    Min 3X/week      PT Plan Current plan remains appropriate    Co-evaluation              AM-PAC PT "6 Clicks" Mobility   Outcome Measure  Help needed turning from your back to your side while in a flat bed without using bedrails?: None Help needed moving from lying on your back to sitting on the side of a flat bed without using bedrails?: None Help needed moving to and from a bed to a chair (including a wheelchair)?: A Lot Help needed standing up from a chair using your arms (e.g., wheelchair or bedside chair)?: A Lot Help needed to walk in hospital room?: A Little Help needed climbing 3-5 steps with a railing? : A Little 6 Click Score: 18    End of Session Equipment Utilized During Treatment: Gait belt Activity Tolerance: Patient tolerated treatment well;Patient limited by fatigue Patient left: in chair;with call bell/phone within reach Nurse Communication: Mobility status PT Visit Diagnosis: Pain;Difficulty in walking, not elsewhere classified (R26.2);Muscle weakness (generalized) (M62.81);Unsteadiness on feet (R26.81) Pain - Right/Left: Left Pain - part of body: Leg      Time: 8338-2505 PT Time Calculation (min) (ACUTE ONLY): 28 min  Charges:  $Gait Training: 23-37 mins                    [place]ita Maxim E, PT Acute Rehabilitation Services Pager [phone]  Office [place] 03/02/2020, 9:48 AM

## 2020-03-02 NOTE — Progress Notes (Signed)
Mobility Specialist: Progress Note    03/02/20 1354  Mobility  Activity Sat and stood x 3;Transferred:  Chair to bed;Ambulated in room  Level of Assistance Modified independent, requires aide device or extra time  Assistive Device Front wheel walker  Distance Ambulated (ft) 25 ft  Mobility Response Tolerated well  Mobility performed by Mobility specialist  $Mobility charge 1 Mobility   Pre-Mobility: 73 HR, 120/106 BP, 83% SpO2 Post-Mobility: 72 HR, 122/61 BP  Pt worked on sit to stands from the chair and from varying heights on EOB. Pt's sats read 83% upon entering room and pt said he felt a little dizzy. Pt had no other c/o. During session I checked sats again and the pulse oximeter read 73% but pt said he felt fine and had no c/o. We continued w/ session performing one sit to stand from chair requiring min assist and three sit to stands from EOB, lowering the height of the bed each time. Pt was able to stand independently using the RW all three attempts.   [facility] Catharina Pica Mobility Specialist

## 2020-03-02 NOTE — TOC Transition Note (Signed)
Transition of Care (TOC) - CM/SW Discharge Note Marvetta Gibbons RN, BSN Transitions of Care Unit 4E- RN Case Manager See Treatment Team for direct phone #    Patient Details  Name: MICHELE KERLIN MRN: [id number] Date of Birth: [birth year]  Transition of Care [facility]) CM/SW Contact:  Dawayne Patricia, RN Phone Number: 03/02/2020, 12:25 PM   Clinical Narrative:    DME has been delivered to room for transition home, [facility] arrangements made with [facility] - will need PT/OT. CM has notified Tiffany with [facility] of planned d/c today.  No further TOC needs noted.    Final next level of care: [place] Barriers to Discharge: Barriers Resolved   Patient Goals and CMS Choice Patient states their goals for this hospitalization and ongoing recovery are:: return home CMS Medicare.gov Compare Post Acute Care list provided to:: Patient Choice offered to / list presented to : Spouse  Discharge Placement               Home with [facility]        Discharge Plan and Services In-house Referral: Clinical Social Work Discharge Planning Services: CM Consult Post Acute Care Choice: Home Health, Durable Medical Equipment          DME Arranged: 3-N-1, [place] rolling DME Agency: [employer] Date DME Agency Contacted: 03/01/20 Time DME Agency Contacted: [phone] Representative spoke with at DME Agency: [place]: PT, OT [place] Agency: [facility] [place] Date [place]: 03/02/20 Time [place]: 92 Representative spoke with at [place]: [place] ([place]) Interventions     Readmission Risk Interventions Readmission Risk Prevention Plan 03/01/2020  Transportation Screening Complete  PCP or Specialist Appt within 5-7 Days Complete  Home Care Screening Complete  Medication Review (RN CM) Complete  Some recent data might be hidden

## 2020-03-03 DIAGNOSIS — M47812 Spondylosis without myelopathy or radiculopathy, cervical region: Secondary | ICD-10-CM | POA: Diagnosis not present

## 2020-03-03 DIAGNOSIS — E1129 Type 2 diabetes mellitus with other diabetic kidney complication: Secondary | ICD-10-CM | POA: Diagnosis not present

## 2020-03-03 DIAGNOSIS — R911 Solitary pulmonary nodule: Secondary | ICD-10-CM | POA: Diagnosis not present

## 2020-03-03 DIAGNOSIS — E785 Hyperlipidemia, unspecified: Secondary | ICD-10-CM | POA: Diagnosis not present

## 2020-03-03 DIAGNOSIS — I10 Essential (primary) hypertension: Secondary | ICD-10-CM | POA: Diagnosis not present

## 2020-03-03 DIAGNOSIS — M6281 Muscle weakness (generalized): Secondary | ICD-10-CM | POA: Diagnosis not present

## 2020-03-03 DIAGNOSIS — Z48812 Encounter for surgical aftercare following surgery on the circulatory system: Secondary | ICD-10-CM | POA: Diagnosis not present

## 2020-03-03 DIAGNOSIS — Z741 Need for assistance with personal care: Secondary | ICD-10-CM | POA: Diagnosis not present

## 2020-03-03 DIAGNOSIS — I2510 Atherosclerotic heart disease of native coronary artery without angina pectoris: Secondary | ICD-10-CM | POA: Diagnosis not present

## 2020-03-03 DIAGNOSIS — Z9181 History of falling: Secondary | ICD-10-CM | POA: Diagnosis not present

## 2020-03-03 DIAGNOSIS — M1A9XX1 Chronic gout, unspecified, with tophus (tophi): Secondary | ICD-10-CM | POA: Diagnosis not present

## 2020-03-03 DIAGNOSIS — Z7982 Long term (current) use of aspirin: Secondary | ICD-10-CM | POA: Diagnosis not present

## 2020-03-03 DIAGNOSIS — E1151 Type 2 diabetes mellitus with diabetic peripheral angiopathy without gangrene: Secondary | ICD-10-CM | POA: Diagnosis not present

## 2020-03-03 DIAGNOSIS — E1169 Type 2 diabetes mellitus with other specified complication: Secondary | ICD-10-CM | POA: Diagnosis not present

## 2020-03-06 ENCOUNTER — Other Ambulatory Visit: Payer: Self-pay | Admitting: Vascular Surgery

## 2020-03-06 DIAGNOSIS — I2510 Atherosclerotic heart disease of native coronary artery without angina pectoris: Secondary | ICD-10-CM | POA: Diagnosis not present

## 2020-03-06 DIAGNOSIS — E1151 Type 2 diabetes mellitus with diabetic peripheral angiopathy without gangrene: Secondary | ICD-10-CM | POA: Diagnosis not present

## 2020-03-06 DIAGNOSIS — E1129 Type 2 diabetes mellitus with other diabetic kidney complication: Secondary | ICD-10-CM | POA: Diagnosis not present

## 2020-03-06 DIAGNOSIS — Z48812 Encounter for surgical aftercare following surgery on the circulatory system: Secondary | ICD-10-CM | POA: Diagnosis not present

## 2020-03-06 DIAGNOSIS — M6281 Muscle weakness (generalized): Secondary | ICD-10-CM | POA: Diagnosis not present

## 2020-03-06 DIAGNOSIS — M47812 Spondylosis without myelopathy or radiculopathy, cervical region: Secondary | ICD-10-CM | POA: Diagnosis not present

## 2020-03-06 NOTE — Discharge Summary (Signed)
Bypass Discharge Summary Patient ID: Matthew [name] [id number] y.o. 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  Postoperative day 2.  His creatinine was up to 1.97.  IV fluids were continued.  He had mild acute postoperative delirium which resolved within 48 hours.  ACE inhibitor's were held due to increasing creatinine.  He continued to have excellent urine output.  He continued to work with physical and Occupational Therapy.  Their recommendations were for skilled nursing facility however the patient refused.  At discharge home health physical therapy was arranged.  On postoperative day 4.  His left dorsalis pedis pulse was palpable and his incisions were healing without signs of infection.  His hemoglobin was stable.  His serum creatinine continued downward trend.  Arrangements made for durable medical equipment and outpatient physical therapy.  The patient was discharged home in satisfactory condition.  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 > 0.15, [ ]  Duplex Discharge ABI: R 0.71, L 0.83 Discharge TBI: R 0.37, L 0.46 D/C Ambulatory Status: Ambulatory with Assistance  Complications: MI: [ ]  No, [ ]  Troponin only, [ ]  EKG or Clinical CHF: No Resp failure: [ ]  none, [ ]  Pneumonia, [ ]  Ventilator Chg in renal function: [ ]  none, [ ]  Inc. Cr > 0.5, [ ]  Temp. Dialysis, [ ]  Permanent dialysis Stroke: [ ]  None, [ ]  Minor, [ ]  Major Return to OR: No  Reason for return to OR: [ ]  Bleeding, [ ]  Infection, [ ]  Thrombosis, [ ]  Revision  Discharge medications: Statin use:  Yes ASA use:  Yes Plavix use:  No  for medical reason not indicated Beta blocker use: Yes Coumadin use: No  for medical reason not indicated    Patient Instructions:  Allergies as of 03/02/2020      Reactions   Penicillins Hives   urticaria (hives)   Tetracycline Hcl Hives    urticaria (hives)   Allopurinol Itching      Medication List    STOP taking these medications   lisinopril 40 MG tablet Commonly known as: ZESTRIL     TAKE these medications   aspirin EC 81 MG tablet Take 81 mg by mouth daily. Swallow whole.   atorvastatin 40 MG tablet Commonly known as: LIPITOR Take 1 tablet by mouth once daily   azelastine 0.1 % nasal spray Commonly known as: ASTELIN Place 1 spray into both nostrils daily as needed for rhinitis. Use in each nostril as directed   chlorthalidone 25 MG tablet Commonly known as: HYGROTON Take 1 tablet by mouth once  daily   cholecalciferol 25 MCG (1000 UNIT) tablet Commonly known as: VITAMIN D3 Take 1,000 Units by mouth daily.   colchicine 0.6 MG tablet Take 0.6 mg by mouth daily as needed (for gout).   cyclobenzaprine 5 MG tablet Commonly known as: FLEXERIL TAKE ONE TABLET BY MOUTH TWICE DAILY AS NEEDED FOR MUSCLE SPASM What changed:   how much to take  how to take this  when to take this  reasons to take this  additional instructions   fexofenadine  180 MG tablet Commonly known as: ALLEGRA Take 180 mg by mouth daily.   fluticasone 50 MCG/ACT nasal spray Commonly known as: FLONASE INSTILL 2 SPRAYS INTO EACH NOSTRIL ONCE DAILY AS NEEDED FOR ALLERGIES OR RHINITIS What changed: See the new instructions.   glimepiride 1 MG tablet Commonly known as: AMARYL TAKE 1 TABLET BY MOUTH ONCE DAILY BEFORE BREAKFAST What changed: See the new instructions.   metoprolol succinate 100 MG 24 hr tablet Commonly known as: TOPROL-XL Take 1 tablet (100 mg total) by mouth daily. Take with or immediately following a meal.   NIFEdipine 30 MG (OSM) 24 hr tablet Commonly known as: PROCARDIA-XL Take 30 mg by mouth 2 (two) times daily.   NovoLIN 70/30 ReliOn (70-30) 100 UNIT/ML injection Generic drug: insulin NPH-regular Human INJECT 22 UNITS INTO THE SKIN IN THE MORNING AND 20 UNITS IN THE EVENING What changed: See the new instructions.   oxyCODONE-acetaminophen 5-325 MG tablet Commonly known as: PERCOCET/ROXICET Take 1 tablet by mouth every 6 (six) hours as needed for moderate pain.   oxymetazoline 0.05 % nasal spray Commonly known as: AFRIN Place 1 spray into both nostrils 2 (two) times daily as needed for congestion.   pioglitazone 30 MG tablet Commonly known as: ACTOS Take 1 tablet by mouth once daily      Activity: ambulate in house Diet: diabetic diet Wound Care: none needed  Follow-up with Dr. Oneida Alar in 3 weeks.  Signed: Edman Circle [name] 03/06/2020 8:16 AM

## 2020-03-07 DIAGNOSIS — I2510 Atherosclerotic heart disease of native coronary artery without angina pectoris: Secondary | ICD-10-CM | POA: Diagnosis not present

## 2020-03-07 DIAGNOSIS — M6281 Muscle weakness (generalized): Secondary | ICD-10-CM | POA: Diagnosis not present

## 2020-03-07 DIAGNOSIS — Z48812 Encounter for surgical aftercare following surgery on the circulatory system: Secondary | ICD-10-CM | POA: Diagnosis not present

## 2020-03-07 DIAGNOSIS — E1151 Type 2 diabetes mellitus with diabetic peripheral angiopathy without gangrene: Secondary | ICD-10-CM | POA: Diagnosis not present

## 2020-03-07 DIAGNOSIS — E1129 Type 2 diabetes mellitus with other diabetic kidney complication: Secondary | ICD-10-CM | POA: Diagnosis not present

## 2020-03-07 DIAGNOSIS — M47812 Spondylosis without myelopathy or radiculopathy, cervical region: Secondary | ICD-10-CM | POA: Diagnosis not present

## 2020-03-09 ENCOUNTER — Other Ambulatory Visit: Payer: Self-pay | Admitting: Physician Assistant

## 2020-03-09 ENCOUNTER — Telehealth: Payer: Self-pay

## 2020-03-09 DIAGNOSIS — I2510 Atherosclerotic heart disease of native coronary artery without angina pectoris: Secondary | ICD-10-CM | POA: Diagnosis not present

## 2020-03-09 DIAGNOSIS — M47812 Spondylosis without myelopathy or radiculopathy, cervical region: Secondary | ICD-10-CM | POA: Diagnosis not present

## 2020-03-09 DIAGNOSIS — Z48812 Encounter for surgical aftercare following surgery on the circulatory system: Secondary | ICD-10-CM | POA: Diagnosis not present

## 2020-03-09 DIAGNOSIS — M6281 Muscle weakness (generalized): Secondary | ICD-10-CM | POA: Diagnosis not present

## 2020-03-09 DIAGNOSIS — E1151 Type 2 diabetes mellitus with diabetic peripheral angiopathy without gangrene: Secondary | ICD-10-CM | POA: Diagnosis not present

## 2020-03-09 DIAGNOSIS — E1129 Type 2 diabetes mellitus with other diabetic kidney complication: Secondary | ICD-10-CM | POA: Diagnosis not present

## 2020-03-09 MED ORDER — OXYCODONE-ACETAMINOPHEN 5-325 MG PO TABS
1.0000 | ORAL_TABLET | Freq: Four times a day (QID) | ORAL | 0 refills | Status: DC | PRN
Start: 2020-03-09 — End: 2020-04-26

## 2020-03-09 NOTE — Telephone Encounter (Signed)
Pt says he is still having alot of leg pain from his surgery on 08/30. Has 1 pill left. Wants another Rx until appt on 9/23 sent to [facility].   Per Dr. Oneida Alar, Pt can have 20 more Percocet for pain.  Thurston Hole., LPN

## 2020-03-09 NOTE — Progress Notes (Signed)
Per Dr. Oneida Alar, pt can have Percocet 5/325 one q6h prn pain #20 (twenty) no refill.  PDMP reviewed prior to prescribing.  Leontine Locket, [facility] 03/09/2020 2:50 PM

## 2020-03-12 ENCOUNTER — Other Ambulatory Visit: Payer: Self-pay | Admitting: Family Medicine

## 2020-03-13 DIAGNOSIS — Z48812 Encounter for surgical aftercare following surgery on the circulatory system: Secondary | ICD-10-CM | POA: Diagnosis not present

## 2020-03-13 DIAGNOSIS — E1151 Type 2 diabetes mellitus with diabetic peripheral angiopathy without gangrene: Secondary | ICD-10-CM | POA: Diagnosis not present

## 2020-03-13 DIAGNOSIS — E1129 Type 2 diabetes mellitus with other diabetic kidney complication: Secondary | ICD-10-CM | POA: Diagnosis not present

## 2020-03-13 DIAGNOSIS — M47812 Spondylosis without myelopathy or radiculopathy, cervical region: Secondary | ICD-10-CM | POA: Diagnosis not present

## 2020-03-13 DIAGNOSIS — M6281 Muscle weakness (generalized): Secondary | ICD-10-CM | POA: Diagnosis not present

## 2020-03-13 DIAGNOSIS — I2510 Atherosclerotic heart disease of native coronary artery without angina pectoris: Secondary | ICD-10-CM | POA: Diagnosis not present

## 2020-03-15 DIAGNOSIS — M6281 Muscle weakness (generalized): Secondary | ICD-10-CM | POA: Diagnosis not present

## 2020-03-15 DIAGNOSIS — I2510 Atherosclerotic heart disease of native coronary artery without angina pectoris: Secondary | ICD-10-CM | POA: Diagnosis not present

## 2020-03-15 DIAGNOSIS — E1151 Type 2 diabetes mellitus with diabetic peripheral angiopathy without gangrene: Secondary | ICD-10-CM | POA: Diagnosis not present

## 2020-03-15 DIAGNOSIS — Z48812 Encounter for surgical aftercare following surgery on the circulatory system: Secondary | ICD-10-CM | POA: Diagnosis not present

## 2020-03-15 DIAGNOSIS — M47812 Spondylosis without myelopathy or radiculopathy, cervical region: Secondary | ICD-10-CM | POA: Diagnosis not present

## 2020-03-15 DIAGNOSIS — E1129 Type 2 diabetes mellitus with other diabetic kidney complication: Secondary | ICD-10-CM | POA: Diagnosis not present

## 2020-03-16 DIAGNOSIS — M47812 Spondylosis without myelopathy or radiculopathy, cervical region: Secondary | ICD-10-CM | POA: Diagnosis not present

## 2020-03-16 DIAGNOSIS — I2510 Atherosclerotic heart disease of native coronary artery without angina pectoris: Secondary | ICD-10-CM | POA: Diagnosis not present

## 2020-03-16 DIAGNOSIS — E1129 Type 2 diabetes mellitus with other diabetic kidney complication: Secondary | ICD-10-CM | POA: Diagnosis not present

## 2020-03-16 DIAGNOSIS — Z48812 Encounter for surgical aftercare following surgery on the circulatory system: Secondary | ICD-10-CM | POA: Diagnosis not present

## 2020-03-16 DIAGNOSIS — E1151 Type 2 diabetes mellitus with diabetic peripheral angiopathy without gangrene: Secondary | ICD-10-CM | POA: Diagnosis not present

## 2020-03-16 DIAGNOSIS — M6281 Muscle weakness (generalized): Secondary | ICD-10-CM | POA: Diagnosis not present

## 2020-03-20 DIAGNOSIS — Z48812 Encounter for surgical aftercare following surgery on the circulatory system: Secondary | ICD-10-CM | POA: Diagnosis not present

## 2020-03-20 DIAGNOSIS — E1129 Type 2 diabetes mellitus with other diabetic kidney complication: Secondary | ICD-10-CM | POA: Diagnosis not present

## 2020-03-20 DIAGNOSIS — M6281 Muscle weakness (generalized): Secondary | ICD-10-CM | POA: Diagnosis not present

## 2020-03-20 DIAGNOSIS — E1151 Type 2 diabetes mellitus with diabetic peripheral angiopathy without gangrene: Secondary | ICD-10-CM | POA: Diagnosis not present

## 2020-03-20 DIAGNOSIS — I2510 Atherosclerotic heart disease of native coronary artery without angina pectoris: Secondary | ICD-10-CM | POA: Diagnosis not present

## 2020-03-20 DIAGNOSIS — M47812 Spondylosis without myelopathy or radiculopathy, cervical region: Secondary | ICD-10-CM | POA: Diagnosis not present

## 2020-03-22 ENCOUNTER — Ambulatory Visit (INDEPENDENT_AMBULATORY_CARE_PROVIDER_SITE_OTHER): Payer: Self-pay | Admitting: Vascular Surgery

## 2020-03-22 ENCOUNTER — Other Ambulatory Visit: Payer: Self-pay

## 2020-03-22 ENCOUNTER — Encounter: Payer: Self-pay | Admitting: Vascular Surgery

## 2020-03-22 ENCOUNTER — Other Ambulatory Visit: Payer: Self-pay | Admitting: *Deleted

## 2020-03-22 ENCOUNTER — Encounter: Payer: Self-pay | Admitting: *Deleted

## 2020-03-22 VITALS — BP 118/64 | HR 74 | Temp 98.2°F | Resp 20 | Ht 72.0 in

## 2020-03-22 DIAGNOSIS — M7989 Other specified soft tissue disorders: Secondary | ICD-10-CM

## 2020-03-22 DIAGNOSIS — I739 Peripheral vascular disease, unspecified: Secondary | ICD-10-CM

## 2020-03-22 NOTE — Progress Notes (Signed)
Patient is a 79 year old male who returns today for postoperative follow-up after a left femoral to above-knee popliteal bypass on February 27, 2020.  He reports no claudication symptoms in his left leg although he is not vigorously ambulatory yet.  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  Unfortunately his wife was recently diagnosed with multiple myeloma.  He wishes to put off the femoropopliteal bypass for a few weeks to continue recovering and also deal with this situation.  He does have some peripheral edema in both lower extremities.  He has had previous saphenectomy of the lower half of the right greater saphenous vein for CABG.  I believe most likely his edema is secondary to having the vein removed and some reperfusion.  Physical exam:  Vitals:   03/22/20 1102  BP: 118/64  Pulse: 74  Resp: 20  Temp: 98.2 F (36.8 C)  SpO2: 97%  Height: 6' (1.829 m)    Extremities: Healing leg incisions no erythema no drainage left leg, left foot is warm but edematous to the point where pulses are difficult to palpate  Right lower extremity trace edema dorsal foot  Assessment: Patent left leg bypass with healing incisions no longer requiring narcotic pain medication.  Claudication right leg with anatomy suitable for right femoral to above-knee popliteal bypass.  Most likely this will require PTFE as he has had a portion of the vein removed for his previous CABG.  We will certainly evaluate the vein at the time of operation to make sure that he does not have vein conduit.  Lower extremity edema chronic most likely secondary to saphenectomy both sides  Plan: Patient was fitted today for bilateral lower extremity knee-high 20 to 30 mm compression stockings for symptomatic relief of his edema.  We will schedule him for a right femoral to above-knee popliteal bypass on April 23, 2020.  Risk benefits possible  complication of procedure details were discussed with patient and his wife again today.   Ruta Hinds, MD [facility] Office: [phone]

## 2020-03-23 ENCOUNTER — Telehealth: Payer: Self-pay

## 2020-03-23 NOTE — Telephone Encounter (Signed)
Patient's wife called stating patient was in office yesterday and they purchased knee-high compression hose for patient. She reports that today Matthew [name] foot was so swollen that he was unable to wear the hose because they were too tight. I asked the wife if the patient was properly elevating his feet above his heart level and she said that he was NOT. The patient then inquired to speak to me directly to explain that he is properly elevating his feet while in his recliner and while laying in his bed. I reiterated how to properly elevate his feet while sitting in the recliner and when laying in bed. Patient voiced his understanding. I also offered patient a bigger size compression hose to ensure his foot will have room. He then insisted that he may need to be re-measured to ensure he has the correct size for his foot. I advised that the compression is measured by the size of his calf and ankle but when he comes in I could resize to make sure he has the correct fitting hose. Patient voiced his understanding.

## 2020-03-26 DIAGNOSIS — I2510 Atherosclerotic heart disease of native coronary artery without angina pectoris: Secondary | ICD-10-CM | POA: Diagnosis not present

## 2020-03-26 DIAGNOSIS — M6281 Muscle weakness (generalized): Secondary | ICD-10-CM | POA: Diagnosis not present

## 2020-03-26 DIAGNOSIS — E1129 Type 2 diabetes mellitus with other diabetic kidney complication: Secondary | ICD-10-CM | POA: Diagnosis not present

## 2020-03-26 DIAGNOSIS — E1151 Type 2 diabetes mellitus with diabetic peripheral angiopathy without gangrene: Secondary | ICD-10-CM | POA: Diagnosis not present

## 2020-03-26 DIAGNOSIS — M47812 Spondylosis without myelopathy or radiculopathy, cervical region: Secondary | ICD-10-CM | POA: Diagnosis not present

## 2020-03-26 DIAGNOSIS — Z48812 Encounter for surgical aftercare following surgery on the circulatory system: Secondary | ICD-10-CM | POA: Diagnosis not present

## 2020-03-27 DIAGNOSIS — H838X3 Other specified diseases of inner ear, bilateral: Secondary | ICD-10-CM | POA: Diagnosis not present

## 2020-03-27 DIAGNOSIS — Z23 Encounter for immunization: Secondary | ICD-10-CM | POA: Diagnosis not present

## 2020-03-27 DIAGNOSIS — H903 Sensorineural hearing loss, bilateral: Secondary | ICD-10-CM | POA: Diagnosis not present

## 2020-03-27 DIAGNOSIS — H6981 Other specified disorders of Eustachian tube, right ear: Secondary | ICD-10-CM | POA: Diagnosis not present

## 2020-03-28 DIAGNOSIS — M47812 Spondylosis without myelopathy or radiculopathy, cervical region: Secondary | ICD-10-CM | POA: Diagnosis not present

## 2020-03-28 DIAGNOSIS — M6281 Muscle weakness (generalized): Secondary | ICD-10-CM | POA: Diagnosis not present

## 2020-03-28 DIAGNOSIS — Z48812 Encounter for surgical aftercare following surgery on the circulatory system: Secondary | ICD-10-CM | POA: Diagnosis not present

## 2020-03-28 DIAGNOSIS — E1151 Type 2 diabetes mellitus with diabetic peripheral angiopathy without gangrene: Secondary | ICD-10-CM | POA: Diagnosis not present

## 2020-03-28 DIAGNOSIS — I2510 Atherosclerotic heart disease of native coronary artery without angina pectoris: Secondary | ICD-10-CM | POA: Diagnosis not present

## 2020-03-28 DIAGNOSIS — E1129 Type 2 diabetes mellitus with other diabetic kidney complication: Secondary | ICD-10-CM | POA: Diagnosis not present

## 2020-03-29 DIAGNOSIS — M47812 Spondylosis without myelopathy or radiculopathy, cervical region: Secondary | ICD-10-CM | POA: Diagnosis not present

## 2020-03-29 DIAGNOSIS — E1129 Type 2 diabetes mellitus with other diabetic kidney complication: Secondary | ICD-10-CM | POA: Diagnosis not present

## 2020-03-29 DIAGNOSIS — Z48812 Encounter for surgical aftercare following surgery on the circulatory system: Secondary | ICD-10-CM | POA: Diagnosis not present

## 2020-03-29 DIAGNOSIS — I2510 Atherosclerotic heart disease of native coronary artery without angina pectoris: Secondary | ICD-10-CM | POA: Diagnosis not present

## 2020-03-29 DIAGNOSIS — M6281 Muscle weakness (generalized): Secondary | ICD-10-CM | POA: Diagnosis not present

## 2020-03-29 DIAGNOSIS — E1151 Type 2 diabetes mellitus with diabetic peripheral angiopathy without gangrene: Secondary | ICD-10-CM | POA: Diagnosis not present

## 2020-04-02 DIAGNOSIS — M6281 Muscle weakness (generalized): Secondary | ICD-10-CM | POA: Diagnosis not present

## 2020-04-02 DIAGNOSIS — I10 Essential (primary) hypertension: Secondary | ICD-10-CM | POA: Diagnosis not present

## 2020-04-02 DIAGNOSIS — M1A9XX1 Chronic gout, unspecified, with tophus (tophi): Secondary | ICD-10-CM | POA: Diagnosis not present

## 2020-04-02 DIAGNOSIS — E1151 Type 2 diabetes mellitus with diabetic peripheral angiopathy without gangrene: Secondary | ICD-10-CM | POA: Diagnosis not present

## 2020-04-02 DIAGNOSIS — E1169 Type 2 diabetes mellitus with other specified complication: Secondary | ICD-10-CM | POA: Diagnosis not present

## 2020-04-02 DIAGNOSIS — Z48812 Encounter for surgical aftercare following surgery on the circulatory system: Secondary | ICD-10-CM | POA: Diagnosis not present

## 2020-04-02 DIAGNOSIS — Z9181 History of falling: Secondary | ICD-10-CM | POA: Diagnosis not present

## 2020-04-02 DIAGNOSIS — Z741 Need for assistance with personal care: Secondary | ICD-10-CM | POA: Diagnosis not present

## 2020-04-02 DIAGNOSIS — E1129 Type 2 diabetes mellitus with other diabetic kidney complication: Secondary | ICD-10-CM | POA: Diagnosis not present

## 2020-04-02 DIAGNOSIS — E785 Hyperlipidemia, unspecified: Secondary | ICD-10-CM | POA: Diagnosis not present

## 2020-04-02 DIAGNOSIS — I2510 Atherosclerotic heart disease of native coronary artery without angina pectoris: Secondary | ICD-10-CM | POA: Diagnosis not present

## 2020-04-02 DIAGNOSIS — R911 Solitary pulmonary nodule: Secondary | ICD-10-CM | POA: Diagnosis not present

## 2020-04-02 DIAGNOSIS — Z7982 Long term (current) use of aspirin: Secondary | ICD-10-CM | POA: Diagnosis not present

## 2020-04-02 DIAGNOSIS — M47812 Spondylosis without myelopathy or radiculopathy, cervical region: Secondary | ICD-10-CM | POA: Diagnosis not present

## 2020-04-03 DIAGNOSIS — M47812 Spondylosis without myelopathy or radiculopathy, cervical region: Secondary | ICD-10-CM | POA: Diagnosis not present

## 2020-04-03 DIAGNOSIS — M6281 Muscle weakness (generalized): Secondary | ICD-10-CM | POA: Diagnosis not present

## 2020-04-03 DIAGNOSIS — E1129 Type 2 diabetes mellitus with other diabetic kidney complication: Secondary | ICD-10-CM | POA: Diagnosis not present

## 2020-04-03 DIAGNOSIS — I2510 Atherosclerotic heart disease of native coronary artery without angina pectoris: Secondary | ICD-10-CM | POA: Diagnosis not present

## 2020-04-03 DIAGNOSIS — E1151 Type 2 diabetes mellitus with diabetic peripheral angiopathy without gangrene: Secondary | ICD-10-CM | POA: Diagnosis not present

## 2020-04-03 DIAGNOSIS — Z48812 Encounter for surgical aftercare following surgery on the circulatory system: Secondary | ICD-10-CM | POA: Diagnosis not present

## 2020-04-10 DIAGNOSIS — E1151 Type 2 diabetes mellitus with diabetic peripheral angiopathy without gangrene: Secondary | ICD-10-CM | POA: Diagnosis not present

## 2020-04-10 DIAGNOSIS — I2510 Atherosclerotic heart disease of native coronary artery without angina pectoris: Secondary | ICD-10-CM | POA: Diagnosis not present

## 2020-04-10 DIAGNOSIS — E1129 Type 2 diabetes mellitus with other diabetic kidney complication: Secondary | ICD-10-CM | POA: Diagnosis not present

## 2020-04-10 DIAGNOSIS — Z48812 Encounter for surgical aftercare following surgery on the circulatory system: Secondary | ICD-10-CM | POA: Diagnosis not present

## 2020-04-10 DIAGNOSIS — M47812 Spondylosis without myelopathy or radiculopathy, cervical region: Secondary | ICD-10-CM | POA: Diagnosis not present

## 2020-04-10 DIAGNOSIS — M6281 Muscle weakness (generalized): Secondary | ICD-10-CM | POA: Diagnosis not present

## 2020-04-16 ENCOUNTER — Other Ambulatory Visit: Payer: Self-pay | Admitting: Family Medicine

## 2020-04-17 NOTE — Progress Notes (Signed)
[facility], [place] [address] Phone: [phone] Fax: [phone]      Your procedure is scheduled on 04/23/20.  Report to [facility] Main Entrance "A" at 5:30 A.M., and check in at the Admitting office.  Call this number if you have problems the morning of surgery:  [phone]  Call [phone] if you have any questions prior to your surgery date Monday-Friday 8am-4pm    Remember:  Do not eat or drink after midnight the night before your surgery    Take these medicines the morning of surgery with A SIP OF WATER: atorvastatin (LIPITOR) fexofenadine (ALLEGRA)  gabapentin (NEURONTIN) metoprolol succinate (TOPROL-XL) NIFEdipine (PROCARDIA-XL)   azelastine (ASTELIN) - nasal spray as needed colchicine - as needed cyclobenzaprine (FLEXERIL) - as needed fluticasone (FLONASE) - as needed oxymetazoline (AFRIN) - as needed  As of today, STOP taking  Aleve, Naproxen, Ibuprofen, Motrin, Advil, Goody's, BC's, all herbal medications, fish oil, and all vitamins.   WHAT DO I DO ABOUT MY DIABETES MEDICATION?   Marland Kitchen Do not take oral diabetes medicines (pills) the morning of surgery.    . THE NIGHT BEFORE SURGERY, take 14 units of Novolin 70/30 insulin.      . The day of surgery, do not take other diabetes injectables, including Byetta (exenatide), Bydureon (exenatide ER), Victoza (liraglutide), or Trulicity (dulaglutide).  . HOW TO MANAGE YOUR DIABETES BEFORE AND AFTER SURGERY  Why is it important to control my blood sugar before and after surgery? . Improving blood sugar levels before and after surgery helps healing and can limit problems. . A way of improving blood sugar control is eating a healthy diet by: o  Eating less sugar and carbohydrates o  Increasing activity/exercise o  Talking with your doctor about reaching your blood sugar goals . High blood sugars (greater than 180 mg/dL)  can raise your risk of infections and slow your recovery, so you will need to focus on controlling your diabetes during the weeks before surgery. . Make sure that the doctor who takes care of your diabetes knows about your planned surgery including the date and location.  How do I manage my blood sugar before surgery? . Check your blood sugar at least 4 times a day, starting 2 days before surgery, to make sure that the level is not too high or low. . Check your blood sugar the morning of your surgery when you wake up and every 2 hours until you get to the Short Stay unit. o If your blood sugar is less than 70 mg/dL, you will need to treat for low blood sugar: - Do not take insulin. - Treat a low blood sugar (less than 70 mg/dL) with  cup of clear juice (cranberry or apple), 4 glucose tablets, OR glucose gel. - Recheck blood sugar in 15 minutes after treatment (to make sure it is greater than 70 mg/dL). If your blood sugar is not greater than 70 mg/dL on recheck, call [phone] for further instructions. . Report your blood sugar to the short stay nurse when you get to Short Stay.  . If you are admitted to the hospital after surgery: o Your blood sugar will be checked by the staff and you will probably be given insulin after surgery (instead of oral diabetes medicines) to make sure you have good blood sugar levels. o The goal for blood sugar control after surgery is 80-180 mg/dL.  Do not wear jewelry                     Do not wear lotions, powders, colognes, or deodorant.              Men may shave face and neck.            Do not bring valuables to the hospital.            [facility] is not responsible for any belongings or valuables.  Do NOT Smoke (Tobacco/Vaping) or drink Alcohol 24 hours prior to your procedure If you use a CPAP at night, you may bring all equipment for your overnight stay.   Contacts, glasses, dentures or bridgework may not be worn into surgery.       For patients admitted to the hospital, discharge time will be determined by your treatment team.   Patients discharged the day of surgery will not be allowed to drive home, and someone needs to stay with them for 24 hours.    Special instructions:   [place]- Preparing For Surgery  Before surgery, you can play an important role. Because skin is not sterile, your skin needs to be as free of germs as possible. You can reduce the number of germs on your skin by washing with CHG (chlorahexidine gluconate) Soap before surgery.  CHG is an antiseptic cleaner which kills germs and bonds with the skin to continue killing germs even after washing.    Oral Hygiene is also important to reduce your risk of infection.  Remember - BRUSH YOUR TEETH THE MORNING OF SURGERY WITH YOUR REGULAR TOOTHPASTE  Please do not use if you have an allergy to CHG or antibacterial soaps. If your skin becomes reddened/irritated stop using the CHG.  Do not shave (including legs and underarms) for at least 48 hours prior to first CHG shower. It is OK to shave your face.  Please follow these instructions carefully.   1. Shower the NIGHT BEFORE SURGERY and the MORNING OF SURGERY with CHG Soap.   2. If you chose to wash your hair, wash your hair first as usual with your normal shampoo.  3. After you shampoo, rinse your hair and body thoroughly to remove the shampoo.  4. Use CHG as you would any other liquid soap. You can apply CHG directly to the skin and wash gently with a scrungie or a clean washcloth.   5. Apply the CHG Soap to your body ONLY FROM THE NECK DOWN.  Do not use on open wounds or open sores. Avoid contact with your eyes, ears, mouth and genitals (private parts). Wash Face and genitals (private parts)  with your normal soap.   6. Wash thoroughly, paying special attention to the area where your surgery will be performed.  7. Thoroughly rinse your body with warm water from the neck down.  8. DO NOT  shower/wash with your normal soap after using and rinsing off the CHG Soap.  9. Pat yourself dry with a CLEAN TOWEL.  10. Wear CLEAN PAJAMAS to bed the night before surgery  11. Place CLEAN SHEETS on your bed the night of your first shower and DO NOT SLEEP WITH PETS.   Day of Surgery: Wear Clean/Comfortable clothing the morning of surgery Do not apply any deodorants/lotions.   Remember to brush your teeth WITH YOUR REGULAR TOOTHPASTE.   Please read over the following fact sheets that you were given.

## 2020-04-18 ENCOUNTER — Encounter (HOSPITAL_COMMUNITY)
Admission: RE | Admit: 2020-04-18 | Discharge: 2020-04-18 | Disposition: A | Discharge status: Home / Self Care | Payer: Medicare Other | Source: Ambulatory Visit | Attending: Vascular Surgery | Admitting: Vascular Surgery

## 2020-04-18 ENCOUNTER — Encounter (HOSPITAL_COMMUNITY): Payer: Self-pay

## 2020-04-18 ENCOUNTER — Other Ambulatory Visit: Payer: Self-pay

## 2020-04-18 DIAGNOSIS — N189 Chronic kidney disease, unspecified: Secondary | ICD-10-CM | POA: Insufficient documentation

## 2020-04-18 DIAGNOSIS — Z951 Presence of aortocoronary bypass graft: Secondary | ICD-10-CM | POA: Diagnosis not present

## 2020-04-18 DIAGNOSIS — I440 Atrioventricular block, first degree: Secondary | ICD-10-CM | POA: Insufficient documentation

## 2020-04-18 DIAGNOSIS — I25810 Atherosclerosis of coronary artery bypass graft(s) without angina pectoris: Secondary | ICD-10-CM | POA: Insufficient documentation

## 2020-04-18 DIAGNOSIS — Z01812 Encounter for preprocedural laboratory examination: Secondary | ICD-10-CM | POA: Insufficient documentation

## 2020-04-18 LAB — URINALYSIS, ROUTINE W REFLEX MICROSCOPIC
Bilirubin Urine: NEGATIVE
Glucose, UA: NEGATIVE mg/dL
Hgb urine dipstick: NEGATIVE
Ketones, ur: 5 mg/dL — AB
Leukocytes,Ua: NEGATIVE
Nitrite: NEGATIVE
Protein, ur: NEGATIVE mg/dL
Specific Gravity, Urine: 1.02 (ref 1.005–1.030)
pH: 5 (ref 5.0–8.0)

## 2020-04-18 LAB — PROTIME-INR
INR: 1.1 (ref 0.8–1.2)
Prothrombin Time: 14.2 seconds (ref 11.4–15.2)

## 2020-04-18 LAB — COMPREHENSIVE METABOLIC PANEL
ALT: 12 U/L (ref 0–44)
AST: 18 U/L (ref 15–41)
Albumin: 3.5 g/dL (ref 3.5–5.0)
Alkaline Phosphatase: 50 U/L (ref 38–126)
Anion gap: 11 (ref 5–15)
BUN: 32 mg/dL — ABNORMAL HIGH (ref 8–23)
CO2: 24 mmol/L (ref 22–32)
Calcium: 9.3 mg/dL (ref 8.9–10.3)
Chloride: 103 mmol/L (ref 98–111)
Creatinine, Ser: 1.85 mg/dL — ABNORMAL HIGH (ref 0.61–1.24)
GFR, Estimated: 34 mL/min — ABNORMAL LOW (ref >60)
Glucose, Bld: 100 mg/dL — ABNORMAL HIGH (ref 70–99)
Potassium: 3.8 mmol/L (ref 3.5–5.1)
Sodium: 138 mmol/L (ref 135–145)
Total Bilirubin: 0.4 mg/dL (ref 0.3–1.2)
Total Protein: 8.4 g/dL — ABNORMAL HIGH (ref 6.5–8.1)

## 2020-04-18 LAB — SURGICAL PCR SCREEN
MRSA, PCR: NEGATIVE
Staphylococcus aureus: NEGATIVE

## 2020-04-18 LAB — CBC
HCT: 39.2 % (ref 39.0–52.0)
Hemoglobin: 12.1 g/dL — ABNORMAL LOW (ref 13.0–17.0)
MCH: 28.1 pg (ref 26.0–34.0)
MCHC: 30.9 g/dL (ref 30.0–36.0)
MCV: 91.2 fL (ref 80.0–100.0)
Platelets: 272 10*3/uL (ref 150–400)
RBC: 4.3 MIL/uL (ref 4.22–5.81)
RDW: 14.6 % (ref 11.5–15.5)
WBC: 6.6 10*3/uL (ref 4.0–10.5)
nRBC: 0 % (ref 0.0–0.2)

## 2020-04-18 LAB — TYPE AND SCREEN
ABO/RH(D): O POS
Antibody Screen: NEGATIVE

## 2020-04-18 LAB — APTT: aPTT: 28 seconds (ref 24–36)

## 2020-04-18 LAB — GLUCOSE, CAPILLARY: Glucose-Capillary: 131 mg/dL — ABNORMAL HIGH (ref 70–99)

## 2020-04-18 NOTE — Progress Notes (Signed)
PCP - Dr. Garret Reddish Cardiologist - Dr. Sherren Mocha  PPM/ICD - denies  Chest x-ray - N/A EKG - 02/15/2020 Stress Test - 02/22/2020 ECHO - 01/21/18 Cardiac Cath - N/A  Sleep Study - denies  CPAP - N/A  Fasting Blood Sugar - 70s -90s Checks Blood Sugar 2 times daily  Blood Thinner Instructions: N/A Aspirin Instructions: Continue taking as normal   ERAS Protcol - No  COVID TEST- Scheduled for 04/20/2020. Patient verbalized understanding of self-quarantine instructions, appointment time and place.  Anesthesia review: YES, cardiac hx  Patient denies shortness of breath, fever, cough and chest pain at PAT appointment  All instructions explained to the patient, with a verbal understanding of the material. Patient agrees to go over the instructions while at home for a better understanding. Patient also instructed to self quarantine after being tested for COVID-19. The opportunity to ask questions was provided.

## 2020-04-19 DIAGNOSIS — E1129 Type 2 diabetes mellitus with other diabetic kidney complication: Secondary | ICD-10-CM | POA: Diagnosis not present

## 2020-04-19 DIAGNOSIS — M47812 Spondylosis without myelopathy or radiculopathy, cervical region: Secondary | ICD-10-CM | POA: Diagnosis not present

## 2020-04-19 DIAGNOSIS — E1151 Type 2 diabetes mellitus with diabetic peripheral angiopathy without gangrene: Secondary | ICD-10-CM | POA: Diagnosis not present

## 2020-04-19 DIAGNOSIS — Z48812 Encounter for surgical aftercare following surgery on the circulatory system: Secondary | ICD-10-CM | POA: Diagnosis not present

## 2020-04-19 DIAGNOSIS — M6281 Muscle weakness (generalized): Secondary | ICD-10-CM | POA: Diagnosis not present

## 2020-04-19 DIAGNOSIS — I2510 Atherosclerotic heart disease of native coronary artery without angina pectoris: Secondary | ICD-10-CM | POA: Diagnosis not present

## 2020-04-19 NOTE — Anesthesia Preprocedure Evaluation (Addendum)
Anesthesia Evaluation  Patient identified by MRN, date of birth, ID band Patient awake    Reviewed: Allergy & Precautions, NPO status , Patient's Chart, lab work & pertinent test results  Airway Mallampati: II  TM Distance: >3 FB Neck ROM: Full    Dental no notable dental hx.    Pulmonary neg pulmonary ROS, former smoker,    Pulmonary exam normal breath sounds clear to auscultation       Cardiovascular hypertension, + CAD, + Past MI, + CABG and + Peripheral Vascular Disease  Normal cardiovascular exam Rhythm:Regular Rate:Normal     Neuro/Psych negative neurological ROS  negative psych ROS   GI/Hepatic Neg liver ROS, GERD  ,  Endo/Other  diabetes  Renal/GU Renal InsufficiencyRenal disease  negative genitourinary   Musculoskeletal negative musculoskeletal ROS (+)   Abdominal   Peds negative pediatric ROS (+)  Hematology negative hematology ROS (+)   Anesthesia Other Findings   Reproductive/Obstetrics negative OB ROS                            Anesthesia Physical Anesthesia Plan  ASA: III  Anesthesia Plan: General   Post-op Pain Management:    Induction: Intravenous  PONV Risk Score and Plan: 2 and Ondansetron, Dexamethasone and Treatment may vary due to age or medical condition  Airway Management Planned: Oral ETT  Additional Equipment:   Intra-op Plan:   Post-operative Plan: Extubation in OR  Informed Consent: I have reviewed the patients History and Physical, chart, labs and discussed the procedure including the risks, benefits and alternatives for the proposed anesthesia with the patient or authorized representative who has indicated his/her understanding and acceptance.     Dental advisory given  Plan Discussed with: CRNA and Surgeon  Anesthesia Plan Comments: (PAT note by Karoline Caldwell, PA-C: Follows with cardiology for hx of CAD s/p CABG. He was seen 02/15/20 by Richardson Dopp PA-C for preop clearance prior to  L CFA endarterectomy and L Fem to above the knee pop bypass with the understanding that he would also need subsequent R fem to above the knee pop bypass. Per note, "Mr. Cousin's perioperative risk of a major cardiac event is 6.6% according to the Revised Cardiac Risk Index (RCRI).  Therefore, he is at high risk for perioperative complications.   His functional capacity is poor at 3.08 METs according to the Duke Activity Status Index (DASI)."  Nuclear stress test was ordered for further stratification.  Test was done 02/22/20, low risk, he was cleared for surgery which he underwent 09/17/20 without complication.   Preop labs reviewed, creatinine elevated at 1.85 consistent with history of CKD.  Otherwise unremarkable.  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  EKG 02/15/2020: Sinus bradycardia.  First-degree AV block.  Septal Q waves.  Nonspecific ST and T wave changes.  Nuclear stress 02/22/2020: Nuclear stress EF: 73%. There was no ST segment deviation noted during stress. The study is normal. This is a low risk study. The left ventricular ejection fraction is hyperdynamic (>65%).  TTE 01/21/2018: - Left ventricle: The cavity size was normal. Wall thickness was  normal. Systolic function was normal. The estimated ejection  fraction was in the range of 55% to 60%. Wall motion was normal;  there were no regional wall motion abnormalities. Doppler  parameters are consistent with abnormal left ventricular  relaxation (grade 1 diastolic dysfunction).   Impressions:   - Normal LV systolic function; mild diastolic dysfunction.  )  Anesthesia Quick Evaluation  

## 2020-04-19 NOTE — Progress Notes (Signed)
Anesthesia Chart Review:  Follows with cardiology for hx of CAD s/p CABG. He was seen 02/15/20 by Richardson Dopp PA-C for preop clearance prior to  L CFA endarterectomy and L Fem to above the knee pop bypass with the understanding that he would also need subsequent R fem to above the knee pop bypass. Per note, "Mr. Courtney's perioperative risk of a major cardiac event is 6.6% according to the Revised Cardiac Risk Index (RCRI).  Therefore, he is at high risk for perioperative complications.   His functional capacity is poor at 3.08 METs according to the Duke Activity Status Index (DASI)."  Nuclear stress test was ordered for further stratification.  Test was done 02/22/20, low risk, he was cleared for surgery which he underwent 5/85/92 without complication.   Preop labs reviewed, creatinine elevated at 1.85 consistent with history of CKD.  Otherwise unremarkable.  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  EKG 02/15/2020: Sinus bradycardia.  First-degree AV block.  Septal Q waves.  Nonspecific ST and T wave changes.  Nuclear stress 02/22/2020:  Nuclear stress EF: 73%.  There was no ST segment deviation noted during stress.  The study is normal.  This is a low risk study.  The left ventricular ejection fraction is hyperdynamic (>65%).  TTE 01/21/2018: - Left ventricle: The cavity size was normal. Wall thickness was  normal. Systolic function was normal. The estimated ejection  fraction was in the range of 55% to 60%. Wall motion was normal;  there were no regional wall motion abnormalities. Doppler  parameters are consistent with abnormal left ventricular  relaxation (grade 1 diastolic dysfunction).   Impressions:   - Normal LV systolic function; mild diastolic dysfunction.    Wynonia Musty [facility] Stay [facility]/Anesthesiology Phone [phone] 04/19/2020 11:52 AM

## 2020-04-20 ENCOUNTER — Other Ambulatory Visit (HOSPITAL_COMMUNITY)
Admission: RE | Admit: 2020-04-20 | Discharge: 2020-04-20 | Disposition: A | Discharge status: Home / Self Care | Payer: Medicare Other | Source: Ambulatory Visit | Attending: Vascular Surgery | Admitting: Vascular Surgery

## 2020-04-20 DIAGNOSIS — Z01812 Encounter for preprocedural laboratory examination: Secondary | ICD-10-CM | POA: Insufficient documentation

## 2020-04-20 DIAGNOSIS — Z20822 Contact with and (suspected) exposure to covid-19: Secondary | ICD-10-CM | POA: Insufficient documentation

## 2020-04-20 LAB — SARS CORONAVIRUS 2 (TAT 6-24 HRS): SARS Coronavirus 2: NEGATIVE

## 2020-04-23 ENCOUNTER — Other Ambulatory Visit: Payer: Self-pay

## 2020-04-23 ENCOUNTER — Encounter (HOSPITAL_COMMUNITY)
Admission: RE | Disposition: A | Discharge status: Home / Self Care | Payer: Self-pay | Source: Home / Self Care | Attending: Vascular Surgery

## 2020-04-23 ENCOUNTER — Inpatient Hospital Stay (HOSPITAL_COMMUNITY): Payer: Medicare Other | Admitting: Anesthesiology

## 2020-04-23 ENCOUNTER — Inpatient Hospital Stay (HOSPITAL_COMMUNITY): Payer: Medicare Other | Admitting: Physician Assistant

## 2020-04-23 ENCOUNTER — Encounter (HOSPITAL_COMMUNITY): Payer: Self-pay | Admitting: Vascular Surgery

## 2020-04-23 ENCOUNTER — Inpatient Hospital Stay (HOSPITAL_COMMUNITY)
Admission: RE | Admit: 2020-04-23 | Discharge: 2020-04-26 | DRG: 253 | Disposition: A | Discharge status: Home Health | Payer: Medicare Other | Attending: Vascular Surgery | Admitting: Vascular Surgery

## 2020-04-23 DIAGNOSIS — R600 Localized edema: Secondary | ICD-10-CM | POA: Diagnosis present

## 2020-04-23 DIAGNOSIS — Z79899 Other long term (current) drug therapy: Secondary | ICD-10-CM

## 2020-04-23 DIAGNOSIS — I739 Peripheral vascular disease, unspecified: Secondary | ICD-10-CM | POA: Diagnosis present

## 2020-04-23 DIAGNOSIS — E1151 Type 2 diabetes mellitus with diabetic peripheral angiopathy without gangrene: Secondary | ICD-10-CM | POA: Diagnosis not present

## 2020-04-23 DIAGNOSIS — Z8711 Personal history of peptic ulcer disease: Secondary | ICD-10-CM

## 2020-04-23 DIAGNOSIS — Z888 Allergy status to other drugs, medicaments and biological substances status: Secondary | ICD-10-CM

## 2020-04-23 DIAGNOSIS — Z7984 Long term (current) use of oral hypoglycemic drugs: Secondary | ICD-10-CM

## 2020-04-23 DIAGNOSIS — K2960 Other gastritis without bleeding: Secondary | ICD-10-CM | POA: Diagnosis present

## 2020-04-23 DIAGNOSIS — Z951 Presence of aortocoronary bypass graft: Secondary | ICD-10-CM

## 2020-04-23 DIAGNOSIS — I7300 Raynaud's syndrome without gangrene: Secondary | ICD-10-CM | POA: Diagnosis not present

## 2020-04-23 DIAGNOSIS — E785 Hyperlipidemia, unspecified: Secondary | ICD-10-CM | POA: Diagnosis present

## 2020-04-23 DIAGNOSIS — I129 Hypertensive chronic kidney disease with stage 1 through stage 4 chronic kidney disease, or unspecified chronic kidney disease: Secondary | ICD-10-CM | POA: Diagnosis present

## 2020-04-23 DIAGNOSIS — I7092 Chronic total occlusion of artery of the extremities: Secondary | ICD-10-CM | POA: Diagnosis present

## 2020-04-23 DIAGNOSIS — Z87891 Personal history of nicotine dependence: Secondary | ICD-10-CM | POA: Diagnosis not present

## 2020-04-23 DIAGNOSIS — N183 Chronic kidney disease, stage 3 unspecified: Secondary | ICD-10-CM | POA: Diagnosis not present

## 2020-04-23 DIAGNOSIS — K649 Unspecified hemorrhoids: Secondary | ICD-10-CM | POA: Diagnosis present

## 2020-04-23 DIAGNOSIS — Z7982 Long term (current) use of aspirin: Secondary | ICD-10-CM | POA: Diagnosis not present

## 2020-04-23 DIAGNOSIS — I440 Atrioventricular block, first degree: Secondary | ICD-10-CM | POA: Diagnosis present

## 2020-04-23 DIAGNOSIS — I2510 Atherosclerotic heart disease of native coronary artery without angina pectoris: Secondary | ICD-10-CM | POA: Diagnosis present

## 2020-04-23 DIAGNOSIS — I70291 Other atherosclerosis of native arteries of extremities, right leg: Principal | ICD-10-CM | POA: Diagnosis present

## 2020-04-23 DIAGNOSIS — Z20822 Contact with and (suspected) exposure to covid-19: Secondary | ICD-10-CM | POA: Diagnosis present

## 2020-04-23 DIAGNOSIS — Z881 Allergy status to other antibiotic agents status: Secondary | ICD-10-CM

## 2020-04-23 DIAGNOSIS — K219 Gastro-esophageal reflux disease without esophagitis: Secondary | ICD-10-CM | POA: Diagnosis not present

## 2020-04-23 DIAGNOSIS — Z9889 Other specified postprocedural states: Secondary | ICD-10-CM | POA: Diagnosis not present

## 2020-04-23 DIAGNOSIS — E1122 Type 2 diabetes mellitus with diabetic chronic kidney disease: Secondary | ICD-10-CM | POA: Diagnosis not present

## 2020-04-23 DIAGNOSIS — I252 Old myocardial infarction: Secondary | ICD-10-CM

## 2020-04-23 DIAGNOSIS — M109 Gout, unspecified: Secondary | ICD-10-CM | POA: Diagnosis present

## 2020-04-23 DIAGNOSIS — E1169 Type 2 diabetes mellitus with other specified complication: Secondary | ICD-10-CM | POA: Diagnosis present

## 2020-04-23 DIAGNOSIS — Z95828 Presence of other vascular implants and grafts: Secondary | ICD-10-CM | POA: Diagnosis not present

## 2020-04-23 DIAGNOSIS — Z880 Allergy status to penicillin: Secondary | ICD-10-CM

## 2020-04-23 HISTORY — PX: FEMORAL-POPLITEAL BYPASS GRAFT: SHX937

## 2020-04-23 LAB — GLUCOSE, CAPILLARY
Glucose-Capillary: 62 mg/dL — ABNORMAL LOW (ref 70–99)
Glucose-Capillary: 65 mg/dL — ABNORMAL LOW (ref 70–99)
Glucose-Capillary: 125 mg/dL — ABNORMAL HIGH (ref 70–99)
Glucose-Capillary: 69 mg/dL — ABNORMAL LOW (ref 70–99)
Glucose-Capillary: 76 mg/dL (ref 70–99)
Glucose-Capillary: 158 mg/dL — ABNORMAL HIGH (ref 70–99)
Glucose-Capillary: 165 mg/dL — ABNORMAL HIGH (ref 70–99)
Glucose-Capillary: 160 mg/dL — ABNORMAL HIGH (ref 70–99)
Glucose-Capillary: 158 mg/dL — ABNORMAL HIGH (ref 70–99)

## 2020-04-23 SURGERY — BYPASS GRAFT FEMORAL-POPLITEAL ARTERY
Anesthesia: General | Site: Leg Upper | Laterality: Right

## 2020-04-23 MED ORDER — DOCUSATE SODIUM 100 MG PO CAPS
100.0000 mg | ORAL_CAPSULE | Freq: Every day | ORAL | Status: DC
  Administered 2020-04-24 – 2020-04-26 (×3): 100 mg via ORAL
  Filled 2020-04-23 (×4): qty 1

## 2020-04-23 MED ORDER — SUGAMMADEX SODIUM 200 MG/2ML IV SOLN
INTRAVENOUS | Status: DC | PRN
  Administered 2020-04-23: 200 mg via INTRAVENOUS

## 2020-04-23 MED ORDER — METOPROLOL TARTRATE 5 MG/5ML IV SOLN: 2.0000 mg | INTRAVENOUS | Status: DC | PRN

## 2020-04-23 MED ORDER — CHLORHEXIDINE GLUCONATE 0.12 % MT SOLN
15.0000 mL | Freq: Once | OROMUCOSAL | Status: AC
  Administered 2020-04-23: 15 mL via OROMUCOSAL
  Filled 2020-04-23: qty 15

## 2020-04-23 MED ORDER — ACETAMINOPHEN 10 MG/ML IV SOLN: 1000.0000 mg | Freq: Once | INTRAVENOUS | Status: DC | PRN

## 2020-04-23 MED ORDER — FENTANYL CITRATE (PF) 250 MCG/5ML IJ SOLN
INTRAMUSCULAR | Status: AC
  Filled 2020-04-23: qty 5

## 2020-04-23 MED ORDER — SODIUM CHLORIDE 0.9 % IV SOLN: INTRAVENOUS | Status: DC

## 2020-04-23 MED ORDER — ACETAMINOPHEN 325 MG PO TABS
325.0000 mg | ORAL_TABLET | ORAL | Status: DC | PRN
  Administered 2020-04-25 (×2): 650 mg via ORAL
  Filled 2020-04-23 (×2): qty 2

## 2020-04-23 MED ORDER — GABAPENTIN 300 MG PO CAPS
300.0000 mg | ORAL_CAPSULE | Freq: Three times a day (TID) | ORAL | Status: DC
  Administered 2020-04-23 – 2020-04-26 (×8): 300 mg via ORAL
  Filled 2020-04-23 (×8): qty 1

## 2020-04-23 MED ORDER — ROCURONIUM BROMIDE 10 MG/ML (PF) SYRINGE
PREFILLED_SYRINGE | INTRAVENOUS | Status: DC | PRN
  Administered 2020-04-23: 70 mg via INTRAVENOUS
  Administered 2020-04-23 (×2): 10 mg via INTRAVENOUS
  Administered 2020-04-23: 20 mg via INTRAVENOUS
  Administered 2020-04-23: 10 mg via INTRAVENOUS

## 2020-04-23 MED ORDER — ASPIRIN EC 81 MG PO TBEC
81.0000 mg | DELAYED_RELEASE_TABLET | Freq: Every day | ORAL | Status: DC
  Administered 2020-04-24 – 2020-04-26 (×3): 81 mg via ORAL
  Filled 2020-04-23 (×3): qty 1

## 2020-04-23 MED ORDER — CHLORHEXIDINE GLUCONATE CLOTH 2 % EX PADS: 6.0000 | MEDICATED_PAD | Freq: Once | CUTANEOUS | Status: DC

## 2020-04-23 MED ORDER — HYDROMORPHONE HCL 1 MG/ML IJ SOLN
0.2500 mg | INTRAMUSCULAR | Status: DC | PRN
  Administered 2020-04-23: 0.25 mg via INTRAVENOUS
  Administered 2020-04-23: 0.5 mg via INTRAVENOUS

## 2020-04-23 MED ORDER — NIFEDIPINE ER OSMOTIC RELEASE 30 MG PO TB24
30.0000 mg | ORAL_TABLET | Freq: Two times a day (BID) | ORAL | Status: DC
  Administered 2020-04-23 – 2020-04-26 (×6): 30 mg via ORAL
  Filled 2020-04-23 (×6): qty 1

## 2020-04-23 MED ORDER — PIOGLITAZONE HCL 30 MG PO TABS
30.0000 mg | ORAL_TABLET | Freq: Every day | ORAL | Status: DC
  Administered 2020-04-24 – 2020-04-26 (×3): 30 mg via ORAL
  Filled 2020-04-23 (×4): qty 1

## 2020-04-23 MED ORDER — HEPARIN SODIUM (PORCINE) 1000 UNIT/ML IJ SOLN
INTRAMUSCULAR | Status: AC
  Filled 2020-04-23: qty 1

## 2020-04-23 MED ORDER — SODIUM CHLORIDE 0.9 % IV SOLN
INTRAVENOUS | Status: DC | PRN
  Administered 2020-04-23: 09:00:00 500 mL

## 2020-04-23 MED ORDER — FENTANYL CITRATE (PF) 100 MCG/2ML IJ SOLN
INTRAMUSCULAR | Status: DC | PRN
  Administered 2020-04-23: 50 ug via INTRAVENOUS
  Administered 2020-04-23 (×3): 100 ug via INTRAVENOUS

## 2020-04-23 MED ORDER — PHENOL 1.4 % MT LIQD: 1.0000 | OROMUCOSAL | Status: DC | PRN

## 2020-04-23 MED ORDER — VANCOMYCIN HCL 1000 MG IV SOLR
INTRAVENOUS | Status: DC | PRN
  Administered 2020-04-23: 1000 mg via INTRAVENOUS

## 2020-04-23 MED ORDER — EPHEDRINE SULFATE-NACL 50-0.9 MG/10ML-% IV SOSY
PREFILLED_SYRINGE | INTRAVENOUS | Status: DC | PRN
  Administered 2020-04-23 (×2): 5 mg via INTRAVENOUS

## 2020-04-23 MED ORDER — ACETAMINOPHEN 650 MG RE SUPP
325.0000 mg | RECTAL | Status: DC | PRN
  Filled 2020-04-23: qty 1

## 2020-04-23 MED ORDER — ORAL CARE MOUTH RINSE: 15.0000 mL | Freq: Once | OROMUCOSAL | Status: AC

## 2020-04-23 MED ORDER — LIDOCAINE 2% (20 MG/ML) 5 ML SYRINGE
INTRAMUSCULAR | Status: DC | PRN
  Administered 2020-04-23: 100 mg via INTRAVENOUS

## 2020-04-23 MED ORDER — PROPOFOL 10 MG/ML IV BOLUS
INTRAVENOUS | Status: AC
  Filled 2020-04-23: qty 20

## 2020-04-23 MED ORDER — POTASSIUM CHLORIDE CRYS ER 20 MEQ PO TBCR: 20.0000 meq | EXTENDED_RELEASE_TABLET | Freq: Every day | ORAL | Status: DC | PRN

## 2020-04-23 MED ORDER — PROTAMINE SULFATE 10 MG/ML IV SOLN
INTRAVENOUS | Status: AC
  Filled 2020-04-23: qty 10

## 2020-04-23 MED ORDER — HYDROMORPHONE HCL 1 MG/ML IJ SOLN
0.5000 mg | INTRAMUSCULAR | Status: DC | PRN
  Administered 2020-04-23: 1 mg via INTRAVENOUS
  Filled 2020-04-23: qty 1

## 2020-04-23 MED ORDER — BISACODYL 10 MG RE SUPP: 10.0000 mg | Freq: Every day | RECTAL | Status: DC | PRN

## 2020-04-23 MED ORDER — HEPARIN SODIUM (PORCINE) 1000 UNIT/ML IJ SOLN
INTRAMUSCULAR | Status: DC | PRN
  Administered 2020-04-23: 9000 [IU] via INTRAVENOUS

## 2020-04-23 MED ORDER — LORATADINE 10 MG PO TABS
10.0000 mg | ORAL_TABLET | Freq: Every day | ORAL | Status: DC
  Administered 2020-04-24 – 2020-04-26 (×3): 10 mg via ORAL
  Filled 2020-04-23 (×3): qty 1

## 2020-04-23 MED ORDER — OXYCODONE HCL 5 MG PO TABS: 5.0000 mg | ORAL_TABLET | Freq: Once | ORAL | Status: DC | PRN

## 2020-04-23 MED ORDER — ONDANSETRON HCL 4 MG/2ML IJ SOLN
INTRAMUSCULAR | Status: DC | PRN
  Administered 2020-04-23: 4 mg via INTRAVENOUS

## 2020-04-23 MED ORDER — INSULIN ASPART 100 UNIT/ML ~~LOC~~ SOLN
0.0000 [IU] | Freq: Three times a day (TID) | SUBCUTANEOUS | Status: DC
  Administered 2020-04-23 – 2020-04-24 (×2): 3 [IU] via SUBCUTANEOUS
  Administered 2020-04-25: 2 [IU] via SUBCUTANEOUS
  Administered 2020-04-25: 3 [IU] via SUBCUTANEOUS
  Administered 2020-04-26: 2 [IU] via SUBCUTANEOUS

## 2020-04-23 MED ORDER — CYCLOBENZAPRINE HCL 10 MG PO TABS: 5.0000 mg | ORAL_TABLET | Freq: Two times a day (BID) | ORAL | Status: DC | PRN

## 2020-04-23 MED ORDER — OXYCODONE HCL 5 MG/5ML PO SOLN: 5.0000 mg | Freq: Once | ORAL | Status: DC | PRN

## 2020-04-23 MED ORDER — PHENYLEPHRINE HCL-NACL 10-0.9 MG/250ML-% IV SOLN
INTRAVENOUS | Status: DC | PRN
  Administered 2020-04-23: 25 ug/min via INTRAVENOUS

## 2020-04-23 MED ORDER — VANCOMYCIN HCL IN DEXTROSE 1-5 GM/200ML-% IV SOLN
1000.0000 mg | INTRAVENOUS | Status: DC
  Filled 2020-04-23: qty 200

## 2020-04-23 MED ORDER — CHLORTHALIDONE 25 MG PO TABS
25.0000 mg | ORAL_TABLET | Freq: Every day | ORAL | Status: DC
  Administered 2020-04-24 – 2020-04-26 (×3): 25 mg via ORAL
  Filled 2020-04-23 (×4): qty 1

## 2020-04-23 MED ORDER — DEXTROSE 50 % IV SOLN
12.5000 g | INTRAVENOUS | Status: AC
  Administered 2020-04-23: 12.5 g via INTRAVENOUS
  Filled 2020-04-23: qty 50

## 2020-04-23 MED ORDER — DEXTROSE 50 % IV SOLN
12.5000 g | INTRAVENOUS | Status: AC
  Filled 2020-04-23: qty 50

## 2020-04-23 MED ORDER — DEXTROSE 50 % IV SOLN
INTRAVENOUS | Status: AC
  Administered 2020-04-23: 12.5 g via INTRAVENOUS
  Filled 2020-04-23: qty 50

## 2020-04-23 MED ORDER — LACTATED RINGERS IV SOLN: INTRAVENOUS | Status: DC

## 2020-04-23 MED ORDER — ONDANSETRON HCL 4 MG/2ML IJ SOLN: 4.0000 mg | Freq: Four times a day (QID) | INTRAMUSCULAR | Status: DC | PRN

## 2020-04-23 MED ORDER — GLIMEPIRIDE 1 MG PO TABS
1.0000 mg | ORAL_TABLET | Freq: Every day | ORAL | Status: DC
  Administered 2020-04-24 – 2020-04-26 (×3): 1 mg via ORAL
  Filled 2020-04-23 (×3): qty 1

## 2020-04-23 MED ORDER — OXYCODONE-ACETAMINOPHEN 5-325 MG PO TABS
1.0000 | ORAL_TABLET | ORAL | Status: DC | PRN
  Administered 2020-04-23 – 2020-04-25 (×6): 2 via ORAL
  Filled 2020-04-23 (×6): qty 2

## 2020-04-23 MED ORDER — 0.9 % SODIUM CHLORIDE (POUR BTL) OPTIME
TOPICAL | Status: DC | PRN
  Administered 2020-04-23: 2000 mL

## 2020-04-23 MED ORDER — LABETALOL HCL 5 MG/ML IV SOLN: 10.0000 mg | INTRAVENOUS | Status: DC | PRN

## 2020-04-23 MED ORDER — PROTAMINE SULFATE 10 MG/ML IV SOLN
INTRAVENOUS | Status: DC | PRN
  Administered 2020-04-23: 90 mg via INTRAVENOUS

## 2020-04-23 MED ORDER — HYDRALAZINE HCL 20 MG/ML IJ SOLN: 5.0000 mg | INTRAMUSCULAR | Status: DC | PRN

## 2020-04-23 MED ORDER — POLYETHYLENE GLYCOL 3350 17 G PO PACK: 17.0000 g | PACK | Freq: Every day | ORAL | Status: DC | PRN

## 2020-04-23 MED ORDER — MAGNESIUM SULFATE 2 GM/50ML IV SOLN: 2.0000 g | Freq: Every day | INTRAVENOUS | Status: DC | PRN

## 2020-04-23 MED ORDER — METOPROLOL SUCCINATE ER 100 MG PO TB24
100.0000 mg | ORAL_TABLET | Freq: Every day | ORAL | Status: DC
  Administered 2020-04-24 – 2020-04-26 (×3): 100 mg via ORAL
  Filled 2020-04-23 (×3): qty 1

## 2020-04-23 MED ORDER — SODIUM CHLORIDE 0.9 % IV SOLN: 500.0000 mL | Freq: Once | INTRAVENOUS | Status: DC | PRN

## 2020-04-23 MED ORDER — PHENYLEPHRINE 40 MCG/ML (10ML) SYRINGE FOR IV PUSH (FOR BLOOD PRESSURE SUPPORT)
PREFILLED_SYRINGE | INTRAVENOUS | Status: DC | PRN
  Administered 2020-04-23 (×2): 120 ug via INTRAVENOUS

## 2020-04-23 MED ORDER — SODIUM CHLORIDE 0.9 % IV SOLN
INTRAVENOUS | Status: AC
  Filled 2020-04-23: qty 1.2

## 2020-04-23 MED ORDER — ALUM & MAG HYDROXIDE-SIMETH 200-200-20 MG/5ML PO SUSP: 15.0000 mL | ORAL | Status: DC | PRN

## 2020-04-23 MED ORDER — DEXAMETHASONE SODIUM PHOSPHATE 10 MG/ML IJ SOLN
INTRAMUSCULAR | Status: DC | PRN
  Administered 2020-04-23: 4 mg via INTRAVENOUS

## 2020-04-23 MED ORDER — GUAIFENESIN-DM 100-10 MG/5ML PO SYRP: 15.0000 mL | ORAL_SOLUTION | ORAL | Status: DC | PRN

## 2020-04-23 MED ORDER — ATORVASTATIN CALCIUM 40 MG PO TABS
40.0000 mg | ORAL_TABLET | Freq: Every day | ORAL | Status: DC
  Administered 2020-04-24 – 2020-04-26 (×3): 40 mg via ORAL
  Filled 2020-04-23 (×3): qty 1

## 2020-04-23 MED ORDER — PROPOFOL 10 MG/ML IV BOLUS
INTRAVENOUS | Status: DC | PRN
  Administered 2020-04-23: 100 mg via INTRAVENOUS

## 2020-04-23 MED ORDER — PANTOPRAZOLE SODIUM 40 MG PO TBEC
40.0000 mg | DELAYED_RELEASE_TABLET | Freq: Every day | ORAL | Status: DC
  Administered 2020-04-24 – 2020-04-26 (×3): 40 mg via ORAL
  Filled 2020-04-23 (×3): qty 1

## 2020-04-23 MED ORDER — HYDROMORPHONE HCL 1 MG/ML IJ SOLN
INTRAMUSCULAR | Status: AC
  Administered 2020-04-23: 0.25 mg via INTRAVENOUS
  Filled 2020-04-23: qty 1

## 2020-04-23 MED ORDER — VANCOMYCIN HCL 500 MG/100ML IV SOLN
500.0000 mg | Freq: Once | INTRAVENOUS | Status: AC
  Administered 2020-04-23: 500 mg via INTRAVENOUS
  Filled 2020-04-23: qty 100

## 2020-04-23 SURGICAL SUPPLY — 60 items
ADH SKN CLS APL DERMABOND .7 (GAUZE/BANDAGES/DRESSINGS) ×1
AGENT HMST SPONGE THK3/8 (HEMOSTASIS)
BANDAGE ESMARK 6X9 LF (GAUZE/BANDAGES/DRESSINGS) IMPLANT
BNDG CMPR 9X6 STRL LF SNTH (GAUZE/BANDAGES/DRESSINGS)
BNDG ESMARK 6X9 LF (GAUZE/BANDAGES/DRESSINGS)
CANISTER SUCT 3000ML PPV (MISCELLANEOUS) ×2 IMPLANT
CANNULA VESSEL 3MM 2 BLNT TIP (CANNULA) ×4 IMPLANT
CLIP VESOCCLUDE MED 24/CT (CLIP) ×2 IMPLANT
CLIP VESOCCLUDE SM WIDE 24/CT (CLIP) ×2 IMPLANT
COVER WAND RF STERILE (DRAPES) IMPLANT
CUFF TOURN SGL QUICK 24 (TOURNIQUET CUFF)
CUFF TOURN SGL QUICK 34 (TOURNIQUET CUFF)
CUFF TOURN SGL QUICK 42 (TOURNIQUET CUFF) IMPLANT
CUFF TRNQT CYL 24X4X16.5-23 (TOURNIQUET CUFF) IMPLANT
CUFF TRNQT CYL 34X4.125X (TOURNIQUET CUFF) IMPLANT
DERMABOND ADVANCED (GAUZE/BANDAGES/DRESSINGS) ×1
DERMABOND ADVANCED .7 DNX12 (GAUZE/BANDAGES/DRESSINGS) ×1 IMPLANT
DRAIN HEMOVAC 1/8 X 5 (WOUND CARE) IMPLANT
DRAPE HALF SHEET 40X57 (DRAPES) IMPLANT
DRAPE X-RAY CASS 24X20 (DRAPES) IMPLANT
ELECT REM PT RETURN 9FT ADLT (ELECTROSURGICAL) ×2
ELECTRODE REM PT RTRN 9FT ADLT (ELECTROSURGICAL) ×1 IMPLANT
EVACUATOR SILICONE 100CC (DRAIN) IMPLANT
GAUZE 4X4 16PLY RFD (DISPOSABLE) ×2 IMPLANT
GLOVE BIO SURGEON STRL SZ7.5 (GLOVE) ×4 IMPLANT
GLOVE BIOGEL PI IND STRL 6.5 (GLOVE) ×1 IMPLANT
GLOVE BIOGEL PI INDICATOR 6.5 (GLOVE) ×1
GLOVE SURG SS PI 6.5 STRL IVOR (GLOVE) ×2 IMPLANT
GOWN STRL NON-REIN LRG LVL3 (GOWN DISPOSABLE) ×2 IMPLANT
GOWN STRL REUS W/ TWL LRG LVL3 (GOWN DISPOSABLE) ×3 IMPLANT
GOWN STRL REUS W/TWL LRG LVL3 (GOWN DISPOSABLE) ×6
HEMOSTAT SPONGE AVITENE ULTRA (HEMOSTASIS) IMPLANT
KIT BASIN OR (CUSTOM PROCEDURE TRAY) ×2 IMPLANT
KIT TURNOVER KIT B (KITS) ×2 IMPLANT
LOOP VESSEL MAXI BLUE (MISCELLANEOUS) ×2 IMPLANT
NS IRRIG 1000ML POUR BTL (IV SOLUTION) ×4 IMPLANT
PACK PERIPHERAL VASCULAR (CUSTOM PROCEDURE TRAY) ×2 IMPLANT
PAD ARMBOARD 7.5X6 YLW CONV (MISCELLANEOUS) ×4 IMPLANT
SET COLLECT BLD 21X3/4 12 (NEEDLE) IMPLANT
STOPCOCK 4 WAY LG BORE MALE ST (IV SETS) IMPLANT
SUT PROLENE 5 0 C 1 24 (SUTURE) ×2 IMPLANT
SUT PROLENE 6 0 BV (SUTURE) ×2 IMPLANT
SUT PROLENE 6 0 CC (SUTURE) ×8 IMPLANT
SUT PROLENE 7 0 BV 1 (SUTURE) ×2 IMPLANT
SUT PROLENE 7 0 BV1 MDA (SUTURE) IMPLANT
SUT SILK 2 0 SH (SUTURE) ×2 IMPLANT
SUT SILK 3 0 (SUTURE) ×4
SUT SILK 3-0 18XBRD TIE 12 (SUTURE) ×2 IMPLANT
SUT VIC AB 2-0 SH 27 (SUTURE) ×4
SUT VIC AB 2-0 SH 27XBRD (SUTURE) ×2 IMPLANT
SUT VIC AB 3-0 SH 27 (SUTURE) ×14
SUT VIC AB 3-0 SH 27X BRD (SUTURE) ×7 IMPLANT
SUT VIC AB 4-0 PS2 18 (SUTURE) ×6 IMPLANT
SUT VIC AB 4-0 PS2 27 (SUTURE) ×4 IMPLANT
TAPE UMBILICAL COTTON 1/8X30 (MISCELLANEOUS) ×2 IMPLANT
TOWEL GREEN STERILE (TOWEL DISPOSABLE) ×2 IMPLANT
TRAY FOLEY MTR SLVR 16FR STAT (SET/KITS/TRAYS/PACK) ×2 IMPLANT
TUBING EXTENTION W/L.L. (IV SETS) IMPLANT
UNDERPAD 30X36 HEAVY ABSORB (UNDERPADS AND DIAPERS) ×2 IMPLANT
WATER STERILE IRR 1000ML POUR (IV SOLUTION) ×2 IMPLANT

## 2020-04-23 NOTE — Anesthesia Procedure Notes (Signed)
Procedure Name: Intubation Date/Time: 04/23/2020 7:40 AM Performed by: Leonor Liv, CRNA Pre-anesthesia Checklist: Patient identified, Emergency Drugs available, Suction available and Patient being monitored Patient Re-evaluated:Patient Re-evaluated prior to induction Oxygen Delivery Method: Circle System Utilized Preoxygenation: Pre-oxygenation with 100% oxygen Induction Type: IV induction Ventilation: Mask ventilation without difficulty Laryngoscope Size: Mac and 4 Grade View: Grade I Tube type: Oral Tube size: 7.5 mm Number of attempts: 1 Airway Equipment and Method: Stylet and Oral airway Placement Confirmation: ETT inserted through vocal cords under direct vision,  positive ETCO2 and breath sounds checked- equal and bilateral Secured at: 23 cm Tube secured with: Tape Dental Injury: Teeth and Oropharynx as per pre-operative assessment

## 2020-04-23 NOTE — Progress Notes (Signed)
  Day of Surgery Note    Subjective:  Comfortable. Awake and alert on 4E   Vitals:   04/23/20 1438 04/23/20 1621  BP: (!) 115/53 (!) 119/52  Pulse: 68 61  Resp: 18 16  Temp: 98.3 F (36.8 C) 98.2 F (36.8 C)  SpO2: 100% 100%    Incisions:   Well approximated. No oozing or hematoma Extremities:  Motor and sensation intact. Feet war. 2+ right DP pulse Cardiac:  RRR Lungs:  Non-labored   Assessment/Plan:  This is a 79 y.o. male who is s/p right fem-AK pop bypass with GSV graft. VSS. LE well perfused  -Continue current treatment plan   Risa Grill, PA-C 04/23/2020 5:04 PM [phone]

## 2020-04-23 NOTE — Progress Notes (Signed)
Pt arrived to unit from PACU.

## 2020-04-23 NOTE — Discharge Instructions (Signed)
 [facility]  Discharge instructions  Lower Extremity Bypass Surgery  Please refer to the following instruction for your post-procedure care. Your surgeon or physician assistant will discuss any changes with you.  Activity  You are encouraged to walk as much as you can. You can slowly return to normal activities during the month after your surgery. Avoid strenuous activity and heavy lifting until your doctor tells you it's OK. Avoid activities such as vacuuming or swinging a golf club. Do not drive until your doctor give the OK and you are no longer taking prescription pain medications. It is also normal to have difficulty with sleep habits, eating and bowel movement after surgery. These will go away with time.  Bathing/Showering  You may shower after you go home. Do not soak in a bathtub, hot tub, or swim until the incision heals completely.  Incision Care  Clean your incision with mild soap and water. Shower every day. Pat the area dry with a clean towel. You do not need a bandage unless otherwise instructed. Do not apply any ointments or creams to your incision. If you have open wounds you will be instructed how to care for them or a visiting nurse may be arranged for you. If you have staples or sutures along your incision they will be removed at your post-op appointment. You may have skin glue on your incision. Do not peel it off. It will come off on its own in about one week. If you have a great deal of moisture in your groin, use a gauze help keep this area dry.  Diet  Resume your normal diet. There are no special food restrictions following this procedure. A low fat/ low cholesterol diet is recommended for all patients with vascular disease. In order to heal from your surgery, it is CRITICAL to get adequate nutrition. Your body requires vitamins, minerals, and protein. Vegetables are the best source of vitamins and minerals. Vegetables also provide the  perfect balance of protein. Processed food has little nutritional value, so try to avoid this.  Medications  Resume taking all your medications unless your doctor or nurse practitioner tells you not to. If your incision is causing pain, you may take over-the-counter pain relievers such as acetaminophen (Tylenol). If you were prescribed a stronger pain medication, please aware these medication can cause nausea and constipation. Prevent nausea by taking the medication with a snack or meal. Avoid constipation by drinking plenty of fluids and eating foods with high amount of fiber, such as fruits, vegetables, and grains. Take Colase 100 mg (an over-the-counter stool softener) twice a day as needed for constipation. Do not take Tylenol if you are taking prescription pain medications.  Follow Up  Our office will schedule a follow up appointment 2-3 weeks following discharge.  Please call us immediately for any of the following conditions  Severe or worsening pain in your legs or feet while at rest or while walking Increase pain, redness, warmth, or drainage (pus) from your incision site(s) Fever of 101 degree or higher The swelling in your leg with the bypass suddenly worsens and becomes more painful than when you were in the hospital If you have been instructed to feel your graft pulse then you should do so every day. If you can no longer feel this pulse, call the office immediately. Not all patients are given this instruction.  Leg swelling is common after leg bypass surgery.  The swelling should improve over a few months   following surgery. To improve the swelling, you may elevate your legs above the level of your heart while you are sitting or resting. Your surgeon or physician assistant may ask you to apply an ACE wrap or wear compression (TED) stockings to help to reduce swelling.  Reduce your risk of vascular disease  Stop smoking. If you would like help call [facility] at 1-800-QUIT-NOW  (1-800-784-8669) or [place] at 336-586-4000.  Manage your cholesterol Maintain a desired weight Control your diabetes weight Control your diabetes Keep your blood pressure down  If you have any questions, please call the office at [phone]   

## 2020-04-23 NOTE — H&P (Signed)
Patient is a 79 year old male who returns today for postoperative follow-up after a left femoral to above-knee popliteal bypass on February 27, 2020.  He reports no claudication symptoms in his left leg although he is not vigorously ambulatory yet.  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  Unfortunately his wife was recently diagnosed with multiple myeloma.  He wishes to put off the femoropopliteal bypass for a few weeks to continue recovering and also deal with this situation.  He does have some peripheral edema in both lower extremities.  He has had previous saphenectomy of the lower half of the right greater saphenous vein for CABG.  I believe most likely his edema is secondary to having the vein removed and some reperfusion.  Physical exam:  Vitals:   04/23/20 0604  BP: (!) 159/63  Pulse: (!) 57  Resp: 18  Temp: 98.5 F (36.9 C)  TempSrc: Oral  SpO2: 100%   Extremities: Healing leg incisions no erythema no drainage left leg, left foot is warm but edematous to the point where pulses are difficult to palpate  Right lower extremity trace edema dorsal foot  Assessment: Patent left leg bypass with healing incisions no longer requiring narcotic pain medication.  Claudication right leg with anatomy suitable for right femoral to above-knee popliteal bypass.  Most likely this will require PTFE as he has had a portion of the vein removed for his previous CABG.  We will certainly evaluate the vein at the time of operation to make sure that he does not have vein conduit.  Lower extremity edema chronic most likely secondary to saphenectomy both sides  Plan: Left fem AK pop bypass today Risk benefits possible complication of procedure details were discussed with patient and his wife again today.   Ruta Hinds, MD [facility] [place] Office: [phone]

## 2020-04-23 NOTE — Progress Notes (Signed)
Pharmacy Antibiotic Note  Matthew [name] is a 79 y.o. male admitted on 04/23/2020 for right femoral to above-knee popliteal bypass. He is post OR and pharmacy to dose vancomycin for post-op prophylaxis (allergy to penicillins noted) -vancomycin 1000mg  given at 7:30am -SCr= 1.85 on 10/20, CrCl ~ 40  Plan: -Vancomycin 500mg  IV x1 at 10pm -No further dosing needed  Temp (24hrs), Avg:98.2 F (36.8 C), Min:97.7 F (36.5 C), Max:98.5 F (36.9 C)  Recent Labs  Lab 04/18/20 1355  WBC 6.6  CREATININE 1.85*    Estimated Creatinine Clearance: 36.7 mL/min (A) (by C-G formula based on SCr of 1.85 mg/dL (H)).    Allergies  Allergen Reactions  . Penicillins Hives  . Tetracycline Hcl Hives  . Allopurinol Itching     Thank you for allowing pharmacy to be a part of this patient's care.  Hildred Laser, PharmD Clinical Pharmacist **Pharmacist phone directory can now be found on [email or web address] ([email or web address]).  Listed under [place].

## 2020-04-23 NOTE — Care Plan (Signed)
Appointment tab reviewed: Patient has follow-up appointment with Dr. Oneida Alar arranged on 05/10/2020 at 8:40 am.

## 2020-04-23 NOTE — Transfer of Care (Signed)
Immediate Anesthesia Transfer of Care Note  Patient: Matthew [name]  Procedure(s) Performed: BYPASS GRAFT FEMORAL- Above Knee POPLITEAL ARTERY RIGHT Using Right Greater Saphenous Vein (Right Leg Upper)  Patient Location: PACU  Anesthesia Type:General  Level of Consciousness: drowsy and patient cooperative  Airway & Oxygen Therapy: Patient Spontanous Breathing and Patient connected to nasal cannula oxygen  Post-op Assessment: Report given to RN and Post -op Vital signs reviewed and stable  Post vital signs: Reviewed and stable  Last Vitals:  Vitals Value Taken Time  BP 150/71 04/23/20 1236  Temp    Pulse 73 04/23/20 1241  Resp 18 04/23/20 1241  SpO2 100 % 04/23/20 1241  Vitals shown include unvalidated device data.  Last Pain:  Vitals:   04/23/20 0628  TempSrc:   PainSc: 0-No pain         Complications: No complications documented.

## 2020-04-23 NOTE — Op Note (Signed)
Procedure: Right femoral to above-knee popliteal bypass using reversed right greater saphenous vein  Preoperative diagnosis: Claudication  Postoperative diagnosis: Same  Anesthesia: General  Assistant: Risa Grill, PA-C for assistance in exposure and creation of anastomosis expediting procedure  Operative findings: 3-1/2 to 4 mm greater saphenous vein  Operative details: After obtaining informed consent, patient taken the operating.  The patient was placed in supine position operating table.  After induction of general anesthesia and endotracheal ovation patient's right lower extremities prepped and draped in usual sterile fashion.  Ultrasound was used to identify the course of the right greater saphenous vein.  It was with a diameter of about 3 to half to 4 mm.  Next a longitudinal incision was made in the right groin carried down through subcutaneous tissues down the level right common femoral artery.  There were some adhesions on the anterior wall presumably from previous catheterization.  All of these were taken down, femoral artery was dissected free circumferentially and vessel loop placed around it.  It was soft on palpation there was some plaque on the posterior medial wall.  Profunda femoris and superficial femoral arteries were dissected free circumferentially and Vesseloops placed around these.  Next the greater saphenous vein was harvested through the medial portion incision.  Several skip incisions were made down the medial aspect of the right thigh to dissect out the vein.  The vein was of good quality about 3-1/2 to 4 mm in diameter.  Small side branches were ligated and divided tween silk ties or clips.  The incision adjacent to the patella in the above-knee area was then deepened and the sartorius muscle reflected posteriorly.  The above-knee popliteal artery was dissected free circumferentially.  It was slightly thickened on palpation but soft.  It was dissected free circumferentially  and Vesseloops placed around it.  There was one large geniculate branch and a vessel loop was also placed around this.  Next a tunnel was created in the subsartorial plane connecting the above-knee popliteal incision to the groin incision.  Patient was given 9000 units of intravenous heparin.  The distal vein was ligated with a 2-0 silk tie and transected.  The saphenofemoral junction was oversewn with a running 5-0 Prolene suture.  The vein was then placed in a reverse configuration.  The artery was controlled proximally distally with Vesseloops.  Longitudinal opening was made in the right common femoral artery and the vein sewn end of vein to side of artery using a running 6-0 Prolene suture.  Despite completion of the anastomosis it was for blood backbled and thoroughly flushed.  Anastomosis was secured clamps released there was 1 area of bleeding on the medial aspect and is repaired with a single 6-0 Prolene suture.  Next the vein was marked for orientation and brought down through the subsartorial tunnel to the above-knee popliteal space.  The popliteal artery was controlled proximally distally with small blood clamps.  Longitudinal opening was made in the artery the vein was cut to length and spatulated and sewn end of vein to side of artery using a running 6-0 Prolene suture.  Despite completion of anastomosis it was for blood backbled and thoroughly flushed.  Anastomosis was secured clamps released there is a palpable posterior tibial pulse and good Doppler signals in the dorsalis pedis and posterior tibial areas of the foot immediately.  Next hemostasis was obtained with the assistance of 90 mg of protamine and direct pressure.  The groin and above-knee popliteal incisions were closed with multiple  layers of running 2-0 and 3-0 Vicryl suture and skin was closed with 4-0 Vicryl subcuticular stitch.  The vein harvest sites were closed with multiple layers of running 3-0 Vicryl suture and 4-0 Vicryl  subcuticular stitch in the skin.  Dermabond was applied.  Patient tired procedure well and there were no complications.  Instrument sponge and needle counts correct in the case.  Patient was taken the recovery room stable condition.  Ruta Hinds, MD [facility] Office: [phone]

## 2020-04-23 NOTE — Anesthesia Postprocedure Evaluation (Signed)
Anesthesia Post Note  Patient: Matthew [name]  Procedure(s) Performed: BYPASS GRAFT FEMORAL- Above Knee POPLITEAL ARTERY RIGHT Using Right Greater Saphenous Vein (Right Leg Upper)     Patient location during evaluation: PACU Anesthesia Type: General Level of consciousness: awake and alert Pain management: pain level controlled Vital Signs Assessment: post-procedure vital signs reviewed and stable Respiratory status: spontaneous breathing, nonlabored ventilation, respiratory function stable and patient connected to nasal cannula oxygen Cardiovascular status: blood pressure returned to baseline and stable Postop Assessment: no apparent nausea or vomiting Anesthetic complications: no   No complications documented.  Last Vitals:  Vitals:   04/23/20 1236 04/23/20 1251  BP: (!) 150/71 (!) 152/66  Pulse: 73 71  Resp: 13 16  Temp: 36.5 C   SpO2: 100% 100%    Last Pain:  Vitals:   04/23/20 1236  TempSrc:   PainSc: Asleep                 Norris Bodley S

## 2020-04-24 ENCOUNTER — Encounter (HOSPITAL_COMMUNITY): Payer: Self-pay | Admitting: Vascular Surgery

## 2020-04-24 LAB — LIPID PANEL
Cholesterol: 98 mg/dL (ref 0–200)
HDL: 33 mg/dL — ABNORMAL LOW (ref >40)
LDL Cholesterol: 53 mg/dL (ref 0–99)
Total CHOL/HDL Ratio: 3 RATIO
Triglycerides: 60 mg/dL (ref <150)
VLDL: 12 mg/dL (ref 0–40)

## 2020-04-24 LAB — CBC
HCT: 34.5 % — ABNORMAL LOW (ref 39.0–52.0)
Hemoglobin: 10.9 g/dL — ABNORMAL LOW (ref 13.0–17.0)
MCH: 28.5 pg (ref 26.0–34.0)
MCHC: 31.6 g/dL (ref 30.0–36.0)
MCV: 90.3 fL (ref 80.0–100.0)
Platelets: 204 10*3/uL (ref 150–400)
RBC: 3.82 MIL/uL — ABNORMAL LOW (ref 4.22–5.81)
RDW: 14.5 % (ref 11.5–15.5)
WBC: 8.4 10*3/uL (ref 4.0–10.5)
nRBC: 0 % (ref 0.0–0.2)

## 2020-04-24 LAB — GLUCOSE, CAPILLARY
Glucose-Capillary: 114 mg/dL — ABNORMAL HIGH (ref 70–99)
Glucose-Capillary: 117 mg/dL — ABNORMAL HIGH (ref 70–99)
Glucose-Capillary: 164 mg/dL — ABNORMAL HIGH (ref 70–99)
Glucose-Capillary: 105 mg/dL — ABNORMAL HIGH (ref 70–99)

## 2020-04-24 LAB — BASIC METABOLIC PANEL
Anion gap: 7 (ref 5–15)
BUN: 34 mg/dL — ABNORMAL HIGH (ref 8–23)
CO2: 28 mmol/L (ref 22–32)
Calcium: 8.7 mg/dL — ABNORMAL LOW (ref 8.9–10.3)
Chloride: 101 mmol/L (ref 98–111)
Creatinine, Ser: 1.84 mg/dL — ABNORMAL HIGH (ref 0.61–1.24)
GFR, Estimated: 37 mL/min — ABNORMAL LOW (ref >60)
Glucose, Bld: 218 mg/dL — ABNORMAL HIGH (ref 70–99)
Potassium: 4.4 mmol/L (ref 3.5–5.1)
Sodium: 136 mmol/L (ref 135–145)

## 2020-04-24 NOTE — Progress Notes (Addendum)
   VASCULAR SURGERY ASSESSMENT & PLAN:   PAD: History of LE claudication; occluded right SFA 1 Day Post-Op right fe-AK-pop with reversed right GSV. VSS. Afebrile.  Right lower extremity well-perfused. Hemoglobin down approximately 1 g, no indication for transfusion.  History of CAD: s/p CABG   CKD: stable Scr; excellent UOP  DM: Continue oral meds; SSI   SUBJECTIVE:   Expected minor postoperative pain.  Foley has just been removed and he has not yet voided spontaneously.  He is tolerating his diet.  He denies chest pain or shortness of breath  PHYSICAL EXAM:   Vitals:   04/23/20 1621 04/23/20 2109 04/24/20 0032 04/24/20 0500  BP: (!) 119/52  (!) 138/44 (!) 117/46  Pulse: 61  64   Resp: 16  17   Temp: 98.2 F (36.8 C) 97.7 F (36.5 C) 97.6 F (36.4 C) 97.8 F (36.6 C)  TempSrc: Oral Oral Oral Oral  SpO2: 100%  100%    Appearance: Awake, alert in no apparent distress Cardiac: Heart rate and rhythm are regular.  Telemetry normal sinus rhythm Lungs: Clear to auscultation bilaterally Incisions: Right groin and right lower extremity incisions are all well approximated without oozing or hematoma. Extremities: Active range of motion and intact sensation.  2+ dorsalis pedis pulse LABS:   Lab Results  Component Value Date   WBC 8.4 04/24/2020   HGB 10.9 (L) 04/24/2020   HCT 34.5 (L) 04/24/2020   MCV 90.3 04/24/2020   PLT 204 04/24/2020   Lab Results  Component Value Date   CREATININE 1.84 (H) 04/24/2020   Lab Results  Component Value Date   INR 1.1 04/18/2020   CBG (last 3)  Recent Labs    04/23/20 1620 04/23/20 2108 04/24/20 0634  GLUCAP 160* 158* 114*    PROBLEM LIST:    Active Problems:   PAD (peripheral artery disease) (HCC)-with claudication   CURRENT MEDS:   . aspirin EC  81 mg Oral Daily  . atorvastatin  40 mg Oral Daily  . chlorthalidone  25 mg Oral Daily  . docusate sodium  100 mg Oral Daily  . gabapentin  300 mg Oral TID  . glimepiride  1  mg Oral Q breakfast  . insulin aspart  0-15 Units Subcutaneous TID WC  . loratadine  10 mg Oral Daily  . metoprolol succinate  100 mg Oral Daily  . NIFEdipine  30 mg Oral BID  . pantoprazole  40 mg Oral Daily  . pioglitazone  30 mg Oral Daily    Risa Grill, [place] Office: [phone] 04/24/2020   2+ right PT pulse No hematoma Agree with above Ambulate today Home next 1-2 days  Ruta Hinds, MD [facility] Office: [phone]

## 2020-04-24 NOTE — Progress Notes (Signed)
PHARMACIST LIPID MONITORING   Matthew [name] is a 79 y.o. male admitted on 04/23/2020 with PVD.  Pharmacy has been consulted to optimize lipid-lowering therapy with the indication of secondary prevention for clinical ASCVD.  Recent Labs:  Lipid Panel (last 6 months):   Lab Results  Component Value Date   CHOL 98 04/24/2020   TRIG 60 04/24/2020   HDL 33 (L) 04/24/2020   CHOLHDL 3.0 04/24/2020   VLDL 12 04/24/2020   LDLCALC 53 04/24/2020    Hepatic function panel (last 6 months):   Lab Results  Component Value Date   AST 18 04/18/2020   ALT 12 04/18/2020   ALKPHOS 50 04/18/2020   BILITOT 0.4 04/18/2020    SCr (since admission):   Serum creatinine: 1.84 mg/dL (H) 04/24/20 0059 Estimated creatinine clearance: 36.9 mL/min (A)  Current lipid-lowering therapy: atorvastatin 40mg  Previous lipid-lowering therapies (if applicable): none Documented or reported allergies or intolerances to lipid-lowering therapies (if applicable): none  Assessment:   He is on a high intensity statin with LDL < 55  Recommendation per protocol:  Increase intensity or dose of current statin.  Follow-up with:  Primary care provider - Marin Olp, MD  Follow-up labs after discharge:    Liver function panel and lipid panel in 8-12 weeks then annually  Plan: -No changes recommended  Hildred Laser, PharmD Clinical Pharmacist **Pharmacist phone directory can now be found on [email or web address] ([email or web address]).  Listed under [place].

## 2020-04-24 NOTE — Evaluation (Signed)
Occupational Therapy Evaluation Patient Details Name: Matthew [name] MRN: [id number] DOB: 1941/06/21 Today's Date: 04/24/2020    History of Present Illness 79 year old male hx of CAD, CABG and left femoral to above-knee popliteal bypass on February 27, 2020. s/p 04/23/20 Right femoral to above-knee popliteal bypass using reversed right greater saphenous vein.   Clinical Impression   PTA, pt was independent with ADL/IADL and functional mobility at RW level. Pt was recently recovering from LLE vascular surgery. Pt was living with his wife in a single story home with 3 steps to enter. Pt currently limited by pain in RLE, limited activity tolerance/endurance and RLE weakness requiring minA to stand from elevated surface. Pt is highly motivated to engage with therapy and progress to prior level of independence. Currently recommend HHOT with 24/7 supervision. Will continue to follow acutely and progress as tolerated.     Follow Up Recommendations  Home health OT;Supervision/Assistance - 24 hour    Equipment Recommendations  3 in 1 bedside commode    Recommendations for Other Services       Precautions / Restrictions Precautions Precautions: Fall Restrictions Weight Bearing Restrictions: No      Mobility Bed Mobility Overal bed mobility: Needs Assistance Bed Mobility: Supine to Sit     Supine to sit: Min guard     General bed mobility comments: minguard for safety    Transfers Overall transfer level: Needs assistance Equipment used: Rolling walker (2 wheeled) Transfers: Sit to/from Stand Sit to Stand: Min assist;From elevated surface         General transfer comment: minA for power up and steadying in RW    Balance Overall balance assessment: Needs assistance Sitting-balance support: Feet supported;No upper extremity supported Sitting balance-Leahy Scale: Fair     Standing balance support: Bilateral upper extremity supported;During functional activity Standing  balance-Leahy Scale: Poor Standing balance comment: requires UE suppport on RW                           ADL either performed or assessed with clinical judgement   ADL Overall ADL's : Needs assistance/impaired Eating/Feeding: Set up;Sitting   Grooming: Set up;Sitting   Upper Body Bathing: Set up;Sitting   Lower Body Bathing: Minimal assistance;Sit to/from stand   Upper Body Dressing : Set up;Sitting   Lower Body Dressing: Minimal assistance;Sit to/from stand   Toilet Transfer: Minimal assistance;RW;Ambulation Armed forces technical officer Details (indicate cue type and reason): able to tolerate short distance mobility before requesting to return to bed due to 9/10 pain Toileting- Clothing Manipulation and Hygiene: Minimal assistance;Sit to/from stand       Functional mobility during ADLs: Minimal assistance;Rolling walker General ADL Comments: pt limited by pain and decreased activity tolerance/endurance, pt reports fatigue at start of session pt reports ambulating and working with PT prior to OT arrival     Vision Patient Visual Report: No change from baseline       Perception     Praxis      Pertinent Vitals/Pain Pain Assessment: 0-10 Pain Score: 9  Pain Location: R leg and foot Pain Descriptors / Indicators: Aching;Burning Pain Intervention(s): Limited activity within patient's tolerance;Monitored during session     Hand Dominance Right   Extremity/Trunk Assessment Upper Extremity Assessment Upper Extremity Assessment: Overall WFL for tasks assessed   Lower Extremity Assessment Lower Extremity Assessment: RLE deficits/detail;Defer to PT evaluation RLE Deficits / Details: hip and knee ROM limited by pain RLE: Unable to fully assess due to pain  RLE Sensation: WNL LLE Deficits / Details: decreased L knee flex/ext from priot sx, strength grossly 3+/5   Cervical / Trunk Assessment Cervical / Trunk Assessment: Normal   Communication Communication Communication:  No difficulties   Cognition Arousal/Alertness: Awake/alert Behavior During Therapy: WFL for tasks assessed/performed Overall Cognitive Status: Within Functional Limits for tasks assessed                                     General Comments  surgical sites intact, RLE edema in foot, VSS on RA    Exercises     Shoulder Instructions      Home Living Family/patient expects to be discharged to:: Private residence Living Arrangements: Spouse/significant other Available Help at Discharge: Family;Available 24 hours/day Type of Home: House Home Access: Stairs to enter [employer] of Steps: 2 vs 3 Entrance Stairs-Rails: Can reach both Home Layout: One level     Bathroom Shower/Tub: Occupational psychologist: Handicapped height     Home Equipment: Environmental consultant - 2 wheels;Cane - single point;Transport chair;Crutches          Prior Functioning/Environment Level of Independence: Independent with assistive device(s)        Comments: ambulates household distances        OT Problem List: Decreased strength;Decreased activity tolerance;Decreased range of motion;Impaired balance (sitting and/or standing);Decreased safety awareness;Decreased knowledge of use of DME or AE;Decreased knowledge of precautions;Pain      OT Treatment/Interventions: Self-care/ADL training;Therapeutic exercise;Energy conservation;DME and/or AE instruction;Therapeutic activities;Patient/family education;Balance training    OT Goals(Current goals can be found in the care plan section) Acute Rehab OT Goals Patient Stated Goal: to get back to prior level of independence OT Goal Formulation: With patient Time For Goal Achievement: 05/08/20 Potential to Achieve Goals: Good ADL Goals Pt Will Perform Grooming: with modified independence;standing Pt Will Perform Lower Body Dressing: with modified independence;sit to/from stand Pt Will Transfer to Toilet: with modified  independence;ambulating  OT Frequency: Min 2X/week   Barriers to D/C:            Co-evaluation              AM-PAC OT "6 Clicks" Daily Activity     Outcome Measure Help from another person eating meals?: None Help from another person taking care of personal grooming?: A Little Help from another person toileting, which includes using toliet, bedpan, or urinal?: A Little Help from another person bathing (including washing, rinsing, drying)?: A Little Help from another person to put on and taking off regular upper body clothing?: A Little Help from another person to put on and taking off regular lower body clothing?: A Little 6 Click Score: 19   End of Session Equipment Utilized During Treatment: Rolling walker Nurse Communication: Mobility status  Activity Tolerance: Patient limited by fatigue;Patient limited by pain Patient left: in bed;with call bell/phone within reach;with bed alarm set  OT Visit Diagnosis: Other abnormalities of gait and mobility (R26.89);Muscle weakness (generalized) (M62.81);Pain Pain - Right/Left: Right Pain - part of body: Leg                Time: 1430-1506 OT Time Calculation (min): 36 min Charges:  OT General Charges $OT Visit: 1 Visit OT Evaluation $OT Eval Moderate Complexity: 1 Mod OT Treatments $Self Care/Home Management : 8-22 mins  Helene Kelp OTR/L Acute Rehabilitation Services Office: [place] 04/24/2020, 3:52 PM

## 2020-04-24 NOTE — TOC Benefit Eligibility Note (Signed)
Transition of Care [facility]) Benefit Eligibility Note    Patient Details  Name: Matthew [name] MRN: [id number] Date of Birth: 14-Aug-1940   Medication/Dose: Wilder Glade 10mg . daily  and Jardiance 10mg . daily  Covered?: Yes  Tier: 3 Drug Wilder Glade tier 4)  Prescription Coverage Preferred Pharmacy: [facility] with Person/Company/Phone Number:: [place] [phone]  Co-Pay: [phone] Wilder Glade and $430.38 Jardiance  Prior Approval: No  Deductible: Unmet       Shelda Altes Phone Number: 04/24/2020, 11:08 AM

## 2020-04-24 NOTE — Care Management (Signed)
Bene check sent for:  farxiga 10 mg/d and jardiance 10mg /d.

## 2020-04-24 NOTE — Progress Notes (Signed)
Mobility Specialist - Progress Note   04/24/20 1324  Mobility  Activity Transferred:  Chair to bed  Level of Assistance Minimal assist, patient does 75% or more  Assistive Device Front wheel walker  Distance Ambulated (ft) 3 ft  Mobility Response Tolerated fair  Mobility performed by Mobility specialist  $Mobility charge 1 Mobility    Pre-mobility: 68 HR, 100% SpO2 During mobility: 71 HR Post-mobility: 64 HR, 98% SpO2  Pt required min assist in order to stand, but was able to slowly transfer into the bed w/o assistance.  Pricilla Handler Mobility Specialist Mobility Specialist Phone: [phone]

## 2020-04-24 NOTE — Evaluation (Signed)
Physical Therapy Evaluation Patient Details Name: Matthew [name] MRN: [id number] DOB: 12/21/1940 Today's Date: 04/24/2020   History of Present Illness  79 year old male hx of CAD, CABG and left femoral to above-knee popliteal bypass on February 27, 2020. s/p 04/23/20 Right femoral to above-knee popliteal bypass using reversed right greater saphenous vein.  Clinical Impression  PTA pt still recovering from L LE vascular surgery and was able to ambulate household distances with RW and perform ADLs. Pt living with wife in single story home with 3 steps to enter. Pt is currently limited in safe mobility by pain in R LE at surgical sites and in R great toe. Pt is currently min A for bed mobility, transfers and ambulation of 3 feet with RW. PT recommending return to [place] (he is very happy with the [place] he has now). PT will continue to follow acutely to progress mobility.     Follow Up Recommendations Home health PT;Supervision/Assistance - 24 hour    Equipment Recommendations  None recommended by PT (has DME)    Recommendations for Other Services OT consult     Precautions / Restrictions Precautions Precautions: Fall Restrictions Weight Bearing Restrictions: No      Mobility  Bed Mobility Overal bed mobility: Needs Assistance Bed Mobility: Supine to Sit     Supine to sit: Min assist     General bed mobility comments: min A for managment of LE across bed, and for pulling up agaist therapist to come to EoB    Transfers Overall transfer level: Needs assistance Equipment used: Rolling walker (2 wheeled) Transfers: Sit to/from Stand Sit to Stand: Min assist;From elevated surface         General transfer comment: minA for power up and steadying in RW  Ambulation/Gait Ambulation/Gait assistance: Min Web designer (Feet): 3 Feet Assistive device: Rolling walker (2 wheeled) Gait Pattern/deviations: Step-to pattern;Decreased weight shift to right;Decreased stance  time - right Gait velocity: slowed Gait velocity interpretation: <1.31 ft/sec, indicative of household ambulator General Gait Details: min A for steadying with lateral stepping from bed to recliner        Balance Overall balance assessment: Needs assistance Sitting-balance support: Feet supported;No upper extremity supported Sitting balance-Leahy Scale: Fair     Standing balance support: Bilateral upper extremity supported;During functional activity Standing balance-Leahy Scale: Poor Standing balance comment: requires UE suppport on RW                             Pertinent Vitals/Pain Pain Assessment: 0-10 Pain Score: 6  Pain Location: R leg and foot Pain Descriptors / Indicators: Aching;Burning Pain Intervention(s): Limited activity within patient's tolerance;Monitored during session;Repositioned    Home Living Family/patient expects to be discharged to:: Private residence Living Arrangements: Spouse/significant other Available Help at Discharge: Family;Available 24 hours/day Type of Home: House Home Access: Stairs to enter Entrance Stairs-Rails: Can reach both Entrance Stairs-Number of Steps: 2 vs 3 Home Layout: One level Home Equipment: Walker - 2 wheels;Cane - single point;Transport chair;Crutches      Prior Function Level of Independence: Independent with assistive device(s)         Comments: ambulates household distances     Hand Dominance   Dominant Hand: Right    Extremity/Trunk Assessment   Upper Extremity Assessment Upper Extremity Assessment: Overall WFL for tasks assessed    Lower Extremity Assessment Lower Extremity Assessment: RLE deficits/detail;LLE deficits/detail RLE Deficits / Details: hip and knee ROM limited by pain  RLE: Unable to fully assess due to pain RLE Sensation: WNL LLE Deficits / Details: decreased L knee flex/ext from priot sx, strength grossly 3+/5    Cervical / Trunk Assessment Cervical / Trunk Assessment:  Normal  Communication   Communication: No difficulties  Cognition Arousal/Alertness: Awake/alert Behavior During Therapy: WFL for tasks assessed/performed Overall Cognitive Status: Within Functional Limits for tasks assessed                                        General Comments General comments (skin integrity, edema, etc.): surgical sites intact, R LE edema especially in foot, c/o pain similar to his claudication pain in his R great toe, toe is warm and appears well perfused, VSS on RA    Exercises General Exercises - Lower Extremity Ankle Circles/Pumps: 10 reps;AROM;Seated   Assessment/Plan    PT Assessment Patient needs continued PT services  PT Problem List Decreased strength;Decreased range of motion;Decreased activity tolerance;Decreased balance;Decreased mobility;Pain       PT Treatment Interventions DME instruction;Gait training;Functional mobility training;Stair training;Therapeutic activities;Therapeutic exercise;Balance training;Cognitive remediation;Patient/family education    PT Goals (Current goals can be found in the Care Plan section)  Acute Rehab PT Goals Patient Stated Goal: less pain with walking  PT Goal Formulation: With patient Time For Goal Achievement: 05/08/20 Potential to Achieve Goals: Good    Frequency Min 3X/week    AM-PAC PT "6 Clicks" Mobility  Outcome Measure Help needed turning from your back to your side while in a flat bed without using bedrails?: None Help needed moving from lying on your back to sitting on the side of a flat bed without using bedrails?: A Little Help needed moving to and from a bed to a chair (including a wheelchair)?: A Little Help needed standing up from a chair using your arms (e.g., wheelchair or bedside chair)?: A Little Help needed to walk in hospital room?: A Lot Help needed climbing 3-5 steps with a railing? : Total 6 Click Score: 16    End of Session Equipment Utilized During Treatment: Gait  belt Activity Tolerance: Patient limited by pain Patient left: in chair;with call bell/phone within reach;with chair alarm set Nurse Communication: Mobility status PT Visit Diagnosis: Unsteadiness on feet (R26.81);Other abnormalities of gait and mobility (R26.89);Muscle weakness (generalized) (M62.81);Difficulty in walking, not elsewhere classified (R26.2);Pain Pain - Right/Left: Right Pain - part of body: Leg;Ankle and joints of foot    Time: 2841-3244 PT Time Calculation (min) (ACUTE ONLY): 48 min   Charges:   PT Evaluation $PT Eval Moderate Complexity: 1 Mod PT Treatments $Therapeutic Exercise: 8-22 mins $Therapeutic Activity: 8-22 mins        Aurorah Schlachter B. Migdalia Dk PT, DPT Acute Rehabilitation Services Pager [phone] Office [phone]   [place] 04/24/2020, 11:59 AM

## 2020-04-24 NOTE — TOC Initial Note (Addendum)
Transition of Care [facility]) - Initial/Assessment Note    Patient Details  Name: Matthew [name] MRN: [id number] Date of Birth: 11-Jul-1940  Transition of Care [facility]) CM/SW Contact:    Carles Collet, RN Phone Number: 04/24/2020, 4:34 PM  Clinical Narrative:            Spoke to patient at bedside. He deferred to wife. Spoke to wife. She states that they have used [facility] recently, and would like to use them again. She currently has RW and 3/1 at home, she has also purchased a tub bench. No DME needs identified for DC at this time. Referral made to [facility], accepted.    Benefit check resulted for [place]. Vania Rea has a two week free trial card. [place] to see what cost would be, they refused to run test claim. Called insurance to verify prices. They state that patient needs to meet deductible, $445, which would be fulfilled after first month, they attempted but could not provide cost of medication after deductible met for either medication.   CM will follow for DC planning needs.       Expected Discharge Plan: [place] Barriers to Discharge: Continued Medical Work up   Patient Goals and CMS Choice Patient states their goals for this hospitalization and ongoing recovery are:: to go home CMS Medicare.gov Compare Post Acute Care list provided to:: Other (Comment Required) Choice offered to / list presented to : Spouse  Expected Discharge Plan and Services Expected Discharge Plan: [place]   Discharge Planning Services: CM Consult Post Acute Care Choice: [place] arrangements for the past 2 months: Single Family Home                 DME Arranged: N/A         HH Arranged: PT, OT HH Agency: [facility] Date [place]: 04/24/20 Time [place]: 1634 Representative spoke with at [place]: [facility]  Prior Living Arrangements/Services Living arrangements for the past 2  months: [place] with:: Spouse   Do you feel safe going back to the place where you live?: Yes          Current home services: DME    Activities of Daily Living      Permission Sought/Granted                  Emotional Assessment              Admission diagnosis:  PAD (peripheral artery disease) ([place]) [I73.9] Patient Active Problem List   Diagnosis Date Noted  . 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 Allergic rhinitis 11/18/2016  . Cervical arthritis ([place]) 10/25/2014  . Pulmonary nodule seen on imaging study 05/17/2012  . Erectile dysfunction 08/13/2010  . CKD (chronic kidney disease), stage III ([place]) 09/15/2008  . PAD (peripheral artery disease) (H[facility])-with claudication 05/14/2007  . Diabetes mellitus with renal complications ([place]) 47/42/5956  . Hyperlipidemia associated with type 2 diabetes mellitus ([place]) 12/28/2006  . 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 Hypertension associated with diabetes ([place]) 12/28/2006  .  Coronary artery disease s/p CABG 1997 12/28/2006   PCP:  Marin Olp, MD Pharmacy:   [facility] [address] [place] [address] Phone: [phone] Fax: [phone]     Social Determinants of Health (SDOH) Interventions    Readmission Risk Interventions Readmission Risk  Prevention Plan 03/01/2020  Transportation Screening Complete  PCP or Specialist Appt within 5-7 Days Complete  Home Care Screening Complete  Medication Review (RN CM) Complete  Some recent data might be hidden

## 2020-04-25 ENCOUNTER — Inpatient Hospital Stay (HOSPITAL_COMMUNITY): Payer: Medicare Other

## 2020-04-25 DIAGNOSIS — Z9889 Other specified postprocedural states: Secondary | ICD-10-CM | POA: Diagnosis not present

## 2020-04-25 LAB — GLUCOSE, CAPILLARY
Glucose-Capillary: 136 mg/dL — ABNORMAL HIGH (ref 70–99)
Glucose-Capillary: 76 mg/dL (ref 70–99)
Glucose-Capillary: 164 mg/dL — ABNORMAL HIGH (ref 70–99)
Glucose-Capillary: 122 mg/dL — ABNORMAL HIGH (ref 70–99)

## 2020-04-25 NOTE — Progress Notes (Signed)
ABI study completed.   See CVProc for preliminary results.   Griffin Basil, RDMS, RVT

## 2020-04-25 NOTE — Progress Notes (Signed)
Occupational Therapy Treatment Patient Details Name: Matthew [name] MRN: [id number] DOB: 14-Jul-1940 Today's Date: 04/25/2020    History of present illness 79 year old male hx of CAD, CABG and left femoral to above-knee popliteal bypass on February 27, 2020. s/p 04/23/20 Right femoral to above-knee popliteal bypass using reversed right greater saphenous vein.   OT comments  Patient met lying supine in bed after just having returned from walking with mobility tech. Lunch present on tray table beside bed. Patient denied EOB/OOB activity 2/2 fatigue but in agreement with discussion on energy conservation, home set-up and safety in prep for d/c home. OT provided education on safety with bathing/dressing including supervision A for shower transfers initially. Patient also states presence of grab bars, shower chair, and [place] in walk-in shower. OT assisted patient with set-up of meal tray. Patient would benefit from continued acute OT services in prep for safe d/c home with 24hr support from wife. Patient may progress beyond need for [facility] services.    Follow Up Recommendations  Home health OT;Supervision/Assistance - 24 hour    Equipment Recommendations  3 in 1 bedside commode    Recommendations for Other Services      Precautions / Restrictions Precautions Precautions: Fall Restrictions Weight Bearing Restrictions: No       Mobility Bed Mobility Overal bed mobility: Needs Assistance Bed Mobility: Supine to Sit     Supine to sit: Min guard;HOB elevated     General bed mobility comments: minguard for safety, increased time and effort and use of bed rail to come to EoB  Transfers Overall transfer level: Needs assistance Equipment used: Rolling walker (2 wheeled) Transfers: Sit to/from Stand Sit to Stand: From elevated surface;Min guard         General transfer comment: Patient lying supine in bed upon entry. Declined EOB/OOB activity 2/2 fatigue.     Balance Overall balance  assessment: Needs assistance Sitting-balance support: Feet supported;No upper extremity supported Sitting balance-Leahy Scale: Fair     Standing balance support: Bilateral upper extremity supported;During functional activity Standing balance-Leahy Scale: Poor Standing balance comment: requires UE suppport on RW                           ADL either performed or assessed with clinical judgement   ADL                                               Vision       Perception     Praxis      Cognition Arousal/Alertness: Awake/alert Behavior During Therapy: WFL for tasks assessed/performed Overall Cognitive Status: Within Functional Limits for tasks assessed                                          Exercises Exercises: General Lower Extremity General Exercises - Lower Extremity Ankle Circles/Pumps: 10 reps;AROM Long Arc Quad: AROM;Right;Seated (unable to tolerate due to pain )   Shoulder Instructions       General Comments 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 provided with warm blankets at end of session, VSS on RA    Pertinent Vitals/ Pain       Pain Assessment: 0-10 Pain Score: 10-Worst pain ever Pain Location: R leg  and foot (dorsum above great toe) Pain Descriptors / Indicators: Aching;Burning Pain Intervention(s): Limited activity within patient's tolerance;Monitored during session;Repositioned;Premedicated before session  Home Living                                          Prior Functioning/Environment              Frequency  Min 2X/week        Progress Toward Goals  OT Goals(current goals can now be found in the care plan section)  Progress towards OT goals: Not progressing toward goals - comment (Very limited session 2/2 fatigue )  Acute Rehab OT Goals Patient Stated Goal: to get back to prior level of independence OT Goal Formulation: With patient Time For Goal Achievement:  05/08/20 Potential to Achieve Goals: Good ADL Goals Pt Will Perform Grooming: with modified independence;standing Pt Will Perform Lower Body Dressing: with modified independence;sit to/from stand Pt Will Transfer to Toilet: with modified independence;ambulating  Plan Discharge plan remains appropriate    Co-evaluation                 AM-PAC OT "6 Clicks" Daily Activity     Outcome Measure   Help from another person eating meals?: None Help from another person taking care of personal grooming?: A Little Help from another person toileting, which includes using toliet, bedpan, or urinal?: A Little Help from another person bathing (including washing, rinsing, drying)?: A Little Help from another person to put on and taking off regular upper body clothing?: A Little Help from another person to put on and taking off regular lower body clothing?: A Little 6 Click Score: 19    End of Session    OT Visit Diagnosis: Other abnormalities of gait and mobility (R26.89);Muscle weakness (generalized) (M62.81);Pain Pain - Right/Left: Right Pain - part of body: Leg   Activity Tolerance Patient limited by fatigue;Patient limited by pain   Patient Left in bed;with call bell/phone within reach;with bed alarm set   Nurse Communication          Time: 8676-7209 OT Time Calculation (min): 11 min  Charges: OT Treatments $Self Care/Home Management : 23-37 mins  Daley Gosse H. OTR/L Supplemental OT, Department of rehab services [phone]   Lateefa Crosby R H. 04/25/2020, 2:06 PM

## 2020-04-25 NOTE — Progress Notes (Signed)
Mobility Specialist - Progress Note   04/25/20 1325  Mobility  Activity Ambulated in hall  Level of Assistance Minimal assist, patient does 75% or more  Assistive Device Front wheel walker  Distance Ambulated (ft) 100 ft  Mobility Response Tolerated well  Mobility performed by Mobility specialist  $Mobility charge 1 Mobility    Pre-mobility: 74 HR During mobility: 86 HR Post-mobility: 74 HR  Pt able to ambulate slowly out in the hallway as well as to the bathroom in his room. He was able to stand up w/o assistance from his chair, but requires the bed raised in order to sit down due to groin pain. Pt on edge of bed to eat lunch after ambulation.   Pricilla Handler Mobility Specialist Mobility Specialist Phone: [phone]

## 2020-04-25 NOTE — Progress Notes (Signed)
Physical Therapy Treatment Patient Details Name: Matthew [name] MRN: [id number] DOB: 12-06-1940 Today's Date: 04/25/2020    History of Present Illness 79 year old male hx of CAD, CABG and left femoral to above-knee popliteal bypass on February 27, 2020. s/p 04/23/20 Right femoral to above-knee popliteal bypass using reversed right greater saphenous vein.    PT Comments    Despite being limited by pain pt able to make good progress towards his goals today. Pt reports 10/10 pain in his groin and was medicated immediately prior to ambulation. Will work on better coordination of pain medication with next session. Pt is currently min guard for bed mobility, and transfers and min A for ambulation of 60 feet with RW. D/c plans remain appropriate at this time. PT will continue to follow acutely.    Follow Up Recommendations  Home health PT;Supervision/Assistance - 24 hour     Equipment Recommendations  None recommended by PT (has DME)       Precautions / Restrictions Precautions Precautions: Fall Restrictions Weight Bearing Restrictions: No    Mobility  Bed Mobility Overal bed mobility: Needs Assistance Bed Mobility: Supine to Sit     Supine to sit: Min guard;HOB elevated     General bed mobility comments: minguard for safety, increased time and effort and use of bed rail to come to EoB  Transfers Overall transfer level: Needs assistance Equipment used: Rolling walker (2 wheeled) Transfers: Sit to/from Stand Sit to Stand: From elevated surface;Min guard         General transfer comment: min guard for safety with power up, vc for standing upright, pt initially in flexed forward position due to groin pain pt eventually reached for RW   Ambulation/Gait Ambulation/Gait assistance: Min assist Gait Distance (Feet): 60 Feet Assistive device: Rolling walker (2 wheeled) Gait Pattern/deviations: Step-to pattern;Step-through pattern;Decreased stance time - right;Decreased weight shift  to right;Shuffle Gait velocity: slowed Gait velocity interpretation: <1.31 ft/sec, indicative of household ambulator General Gait Details: light min A for steadying, vc for proximity to RW, increased use of UE to decrease R LE WB with increased pain, pt able to progress from step to to step through pattern         Balance Overall balance assessment: Needs assistance Sitting-balance support: Feet supported;No upper extremity supported Sitting balance-Leahy Scale: Fair     Standing balance support: Bilateral upper extremity supported;During functional activity Standing balance-Leahy Scale: Poor Standing balance comment: requires UE suppport on RW                            Cognition Arousal/Alertness: Awake/alert Behavior During Therapy: WFL for tasks assessed/performed Overall Cognitive Status: Within Functional Limits for tasks assessed                                        Exercises General Exercises - Lower Extremity Ankle Circles/Pumps: 10 reps;AROM Long Arc Quad: AROM;Right;Seated (unable to tolerate due to pain )    General Comments General comments (skin integrity, edema, etc.): 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 provided with warm blankets at end of session, VSS on RA      Pertinent Vitals/Pain Pain Assessment: 0-10 Pain Score: 10-Worst pain ever Pain Location: R leg and foot (dorsum above great toe) Pain Descriptors / Indicators: Aching;Burning Pain Intervention(s): Limited activity within patient's tolerance;Monitored during session;Repositioned;Premedicated before session  PT Goals (current goals can now be found in the care plan section) Acute Rehab PT Goals Patient Stated Goal: to get back to prior level of independence PT Goal Formulation: With patient Time For Goal Achievement: 05/08/20 Potential to Achieve Goals: Good Progress towards PT goals: Progressing toward goals    Frequency    Min 3X/week       PT Plan Current plan remains appropriate       AM-PAC PT "6 Clicks" Mobility   Outcome Measure  Help needed turning from your back to your side while in a flat bed without using bedrails?: None Help needed moving from lying on your back to sitting on the side of a flat bed without using bedrails?: A Little Help needed moving to and from a bed to a chair (including a wheelchair)?: A Little Help needed standing up from a chair using your arms (e.g., wheelchair or bedside chair)?: None Help needed to walk in hospital room?: A Little Help needed climbing 3-5 steps with a railing? : A Lot 6 Click Score: 19    End of Session Equipment Utilized During Treatment: Gait belt Activity Tolerance: Patient limited by pain Patient left: in chair;with call bell/phone within reach;with chair alarm set Nurse Communication: Mobility status PT Visit Diagnosis: Unsteadiness on feet (R26.81);Other abnormalities of gait and mobility (R26.89);Muscle weakness (generalized) (M62.81);Difficulty in walking, not elsewhere classified (R26.2);Pain Pain - Right/Left: Right Pain - part of body: Leg;Ankle and joints of foot     Time: 3244-0102 PT Time Calculation (min) (ACUTE ONLY): 26 min  Charges:  $Gait Training: 8-22 mins $Therapeutic Exercise: 8-22 mins                     Traivon Morrical B. Migdalia Dk PT, DPT Acute Rehabilitation Services Pager [phone] Office [phone]    [place] 04/25/2020, 12:04 PM

## 2020-04-25 NOTE — Progress Notes (Addendum)
[facility] [place]  Subjective  - Moving slowly, afraid to walk.   Objective (!) 126/54 70 100.2 F (37.9 C) (Oral) 15 100%  Intake/Output Summary (Last 24 hours) at 04/25/2020 0746 Last data filed at 04/25/2020 1314 Gross per 24 hour  Intake 240 ml  Output 1575 ml  Net -1335 ml   Right DP pulse palpable Right groin soft and leg incision healing well Lungs non labored breathing Alert with slight confusion   Assessment/Planning: History of LE claudication; occluded right SFA 2 Day Post-Op right fe-AK-pop with reversed right GSV. VSS. Afebrile.  Right lower extremity well-perfused. Hemoglobin down approximately 1 g, no indication for transfusion.  Encouraged ambulation and mobility HH OT/PT Low grade temp will order IS to bedside.  Possible D/C home tomorrow   Roxy Horseman 04/25/2020 7:46 AM --  Incisions healing 2+ PT pulse Ambulate in hall today D/c home tomorrow  Ruta Hinds, MD [facility] [place]: [phone]   Laboratory Lab Results: Recent Labs    04/24/20 0059  WBC 8.4  HGB 10.9*  HCT 34.5*  PLT 204   BMET Recent Labs    04/24/20 0059  NA 136  K 4.4  CL 101  CO2 28  GLUCOSE 218*  BUN 34*  CREATININE 1.84*  CALCIUM 8.7*    COAG Lab Results  Component Value Date   INR 1.1 04/18/2020   INR 1.2 02/24/2020   No results found for: PTT

## 2020-04-26 LAB — GLUCOSE, CAPILLARY: Glucose-Capillary: 144 mg/dL — ABNORMAL HIGH (ref 70–99)

## 2020-04-26 MED ORDER — OXYCODONE-ACETAMINOPHEN 5-325 MG PO TABS: 1.0000 | ORAL_TABLET | Freq: Four times a day (QID) | ORAL | 0 refills | Status: DC | PRN

## 2020-04-26 NOTE — Progress Notes (Signed)
[facility] [place]  Subjective  - still some soreness but better  Objective 128/66 65 97.9 F (36.6 C) (Oral) 19 99%  Intake/Output Summary (Last 24 hours) at 04/26/2020 0818 Last data filed at 04/26/2020 0742 Gross per 24 hour  Intake 480 ml  Output 2025 ml  Net -1545 ml   2+ PT pulse right foot Incisions healing  Assessment/Planning: POD #3 right fem ak pop with vein D/c home today  Matthew [name] 04/26/2020 8:18 AM --  Laboratory Lab Results: Recent Labs    04/24/20 0059  WBC 8.4  HGB 10.9*  HCT 34.5*  PLT 204   BMET Recent Labs    04/24/20 0059  NA 136  K 4.4  CL 101  CO2 28  GLUCOSE 218*  BUN 34*  CREATININE 1.84*  CALCIUM 8.7*    COAG Lab Results  Component Value Date   INR 1.1 04/18/2020   INR 1.2 02/24/2020   No results found for: PTT

## 2020-04-27 ENCOUNTER — Telehealth: Payer: Self-pay | Admitting: *Deleted

## 2020-04-27 NOTE — Telephone Encounter (Signed)
Pt called to clarify medication instructions after discharge from hospital this week. Went over medication instructions with pt. Pt also concerned about drainage from groin incision. Drainage is odorless and clear with a red tinge. His incision is intact. Instructed pt to keep incision covered with gauze and report any increase in drainage or signs of infection. Pt verbalized understanding.

## 2020-04-30 ENCOUNTER — Other Ambulatory Visit: Payer: Self-pay | Admitting: Physician Assistant

## 2020-04-30 ENCOUNTER — Other Ambulatory Visit: Payer: Self-pay | Admitting: Vascular Surgery

## 2020-05-01 DIAGNOSIS — M47812 Spondylosis without myelopathy or radiculopathy, cervical region: Secondary | ICD-10-CM | POA: Diagnosis not present

## 2020-05-01 DIAGNOSIS — Z48812 Encounter for surgical aftercare following surgery on the circulatory system: Secondary | ICD-10-CM | POA: Diagnosis not present

## 2020-05-01 DIAGNOSIS — M6281 Muscle weakness (generalized): Secondary | ICD-10-CM | POA: Diagnosis not present

## 2020-05-01 DIAGNOSIS — I2510 Atherosclerotic heart disease of native coronary artery without angina pectoris: Secondary | ICD-10-CM | POA: Diagnosis not present

## 2020-05-01 DIAGNOSIS — E1151 Type 2 diabetes mellitus with diabetic peripheral angiopathy without gangrene: Secondary | ICD-10-CM | POA: Diagnosis not present

## 2020-05-01 DIAGNOSIS — E1129 Type 2 diabetes mellitus with other diabetic kidney complication: Secondary | ICD-10-CM | POA: Diagnosis not present

## 2020-05-01 NOTE — Discharge Summary (Signed)
[facility] Discharge Summary   Patient ID:  Matthew [name] MRN: [id number] DOB/AGE: 01-19-1941 79 y.o.  Admit date: 04/23/2020 Discharge date: 04/26/2020 Date of Surgery: 04/23/2020 Surgeon: Surgeon(s): Fields, Jessy Oto, MD  Admission Diagnosis: PAD (peripheral artery disease) [facility]) [I73.9]  Discharge Diagnoses:  PAD (peripheral artery disease) ([place]) [I73.9]  Secondary Diagnoses: Past Medical History:  Diagnosis Date  . CLAUDICATION 05/14/2007  . CORONARY ARTERY DISEASE 12/28/2006   Myoview 8/21: EF 73, normal perfusion, low risk   . Diabetes mellitus type II, controlled ([place]) 12/28/2006   Actos 30mg , insulin 70/30 20 units BID, amaryl 1mg  previously a1c <8. Worsened due to cold over 5 weeks around 05/2014 eating poorly and eating sugary syrups.   Stomach irritation on metformin.  Lab Results  Component Value Date   HGBA1C 8.7* 06/05/2014       . DIABETES MELLITUS, TYPE II 12/28/2006  . Duodenal ulcer   . Erosive gastritis   . GERD (gastroesophageal reflux disease)   . GOUT 12/28/2006  . Hemorrhoids   . Hiatal hernia   . HYPERLIPIDEMIA 12/28/2006  . HYPERTENSION 12/28/2006  . LATERAL EPICONDYLITIS, RIGHT 12/11/2009  . Myocardial infarction ([place])    23 yrs ago per pt  . Raynaud's disease   . RENAL FAILURE, CHRONIC 09/15/2008  . Tubular adenoma of colon 11/2010    Procedure(s): BYPASS GRAFT FEMORAL- Above Knee POPLITEAL ARTERY RIGHT Using Right Greater Saphenous Vein  Discharged Condition: stable  HPI: 79 y/o male with known claudication symptoms on the right LE.   S/P left femoral to above-knee popliteal bypass on February 27, 2020.   Now our attention is going to the right LE.  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   Hospital Course:  Matthew [name] is a 79 y.o. male is S/P Procedure(s): BYPASS GRAFT FEMORAL- Above Knee POPLITEAL ARTERY RIGHT Using  Right Greater Saphenous Vein   Post op he has a palpable right DP pain has been an issue, but is resolving.  He developed a low grade temp and was given IS.  His temp resolved.  He is ambulating in the halls with rolling walker independently.  He was discharged home POD # 3 in stable condition.     Significant Diagnostic Studies: CBC Lab Results  Component Value Date   WBC 8.4 04/24/2020   HGB 10.9 (L) 04/24/2020   HCT 34.5 (L) 04/24/2020   MCV 90.3 04/24/2020   PLT 204 04/24/2020    BMET    Component Value Date/Time   NA 136 04/24/2020 0059   NA 141 02/22/2019 0000   K 4.4 04/24/2020 0059   CL 101 04/24/2020 0059   CO2 28 04/24/2020 0059   GLUCOSE 218 (H) 04/24/2020 0059   BUN 34 (H) 04/24/2020 0059   BUN 22 (A) 02/22/2019 0000   CREATININE 1.84 (H) 04/24/2020 0059   CALCIUM 8.7 (L) 04/24/2020 0059   GFRNONAA 37 (L) 04/24/2020 0059   GFRAA 41 (L) 02/29/2020 0753   COAG Lab Results  Component Value Date   INR 1.1 04/18/2020   INR 1.2 02/24/2020     Disposition:  Discharge to :Home Discharge Instructions    Call MD for:  redness, tenderness, or signs of infection (pain, swelling, bleeding, redness, odor or green/yellow discharge around incision site)   Complete by: As directed    Call MD for:  severe or increased pain, loss or decreased feeling  in affected limb(s)   Complete by: As directed    Call MD for:  temperature >100.5   Complete by: As directed    Resume previous diet   Complete by: As directed      Allergies as of 04/26/2020      Reactions   Penicillins Hives   Tetracycline Hcl Hives   Allopurinol Itching      Medication List    TAKE these medications   aspirin EC 81 MG tablet Take 81 mg by mouth daily. Swallow whole.   atorvastatin 40 MG tablet Commonly known as: LIPITOR Take 1 tablet by mouth once daily   azelastine 0.1 % nasal spray Commonly known as: ASTELIN Place 1 spray into both nostrils daily as needed for rhinitis. Use in each  nostril as directed   chlorthalidone 25 MG tablet Commonly known as: HYGROTON Take 1 tablet by mouth once daily   cholecalciferol 25 MCG (1000 UNIT) tablet Commonly known as: VITAMIN D3 Take 1,000 Units by mouth daily.   colchicine 0.6 MG tablet Take 0.6 mg by mouth daily as needed (for gout).   cyclobenzaprine 5 MG tablet Commonly known as: FLEXERIL TAKE ONE TABLET BY MOUTH TWICE DAILY AS NEEDED FOR MUSCLE SPASM What changed:   how much to take  how to take this  when to take this  reasons to take this  additional instructions   fexofenadine 180 MG tablet Commonly known as: ALLEGRA Take 180 mg by mouth daily.   fluticasone 50 MCG/ACT nasal spray Commonly known as: FLONASE USE 2 SPRAY(S) IN EACH NOSTRIL ONCE DAILY AS NEEDED FOR ALLERGIES OR RHINITIS   glimepiride 1 MG tablet Commonly known as: AMARYL TAKE 1 TABLET BY MOUTH ONCE DAILY BEFORE BREAKFAST What changed: See the new instructions.   metoprolol succinate 100 MG 24 hr tablet Commonly known as: TOPROL-XL Take 1 tablet (100 mg total) by mouth daily. Take with or immediately following a meal.   NIFEdipine 30 MG (OSM) 24 hr tablet Commonly known as: PROCARDIA-XL Take 30 mg by mouth 2 (two) times daily.   NovoLIN 70/30 ReliOn (70-30) 100 UNIT/ML injection Generic drug: insulin NPH-regular Human INJECT 22 UNITS INTO THE SKIN IN THE MORNING AND 20 UNITS IN THE EVENING What changed: See the new instructions.   oxyCODONE-acetaminophen 5-325 MG tablet Commonly known as: PERCOCET/ROXICET Take 1 tablet by mouth every 6 (six) hours as needed for moderate pain.   oxymetazoline 0.05 % nasal spray Commonly known as: AFRIN Place 1 spray into both nostrils 2 (two) times daily as needed for congestion.   pioglitazone 30 MG tablet Commonly known as: ACTOS Take 1 tablet by mouth once daily      Verbal and written Discharge instructions given to the patient. Wound care per Discharge AVS  Follow-up Information     Elam Dutch, MD Follow up in 2 week(s).   Specialties: Vascular Surgery, Cardiology Contact information: [address] [place] [address] [phone]               Signed: Roxy Horseman 05/01/2020, 8:26 AM - For VQI Registry use --- Instructions: Press F2 to tab through selections.  Delete question if not applicable.   Post-op:  Wound infection: No  Graft infection: No  Transfusion: No  If yes, 0 units given New Arrhythmia: No Ipsilateral amputation: [x ] no, [ ]  Minor, [ ]  BKA, [ ]  AKA Discharge patency: [x ] Primary, [ ]  Primary assisted, [ ]  Secondary, [ ]  Occluded Patency judged  by: [ ]  Dopper only, [ ]  Palpable graft pulse, [x ] Palpable distal pulse, [ ]  ABI inc. > 0.15, [ ]  Duplex Discharge ABI: R 0.85, L 0.81 Discharge TBI: R 0.49, L 0.48 D/C Ambulatory Status: Ambulatory  Complications: MI: [x ] No, [ ]  Troponin only, [ ]  EKG or Clinical CHF: No Resp failure: [x ] none, [ ]  Pneumonia, [ ]  Ventilator Chg in renal function: [x ] none, [ ]  Inc. Cr > 0.5, [ ]  Temp. Dialysis, [ ]  Permanent dialysis Stroke: [x ] None, [ ]  Minor, [ ]  Major Return to OR: No  Reason for return to OR: [ ]  Bleeding, [ ]  Infection, [ ]  Thrombosis, [ ]  Revision  Discharge medications: Statin use:  Yes ASA use:  Yes Plavix use:  No  for medical reason  not indicated Beta blocker use: Yes Coumadin use: No  for medical reason not indicated

## 2020-05-02 DIAGNOSIS — Z48812 Encounter for surgical aftercare following surgery on the circulatory system: Secondary | ICD-10-CM | POA: Diagnosis not present

## 2020-05-02 DIAGNOSIS — I2510 Atherosclerotic heart disease of native coronary artery without angina pectoris: Secondary | ICD-10-CM | POA: Diagnosis not present

## 2020-05-02 DIAGNOSIS — E1129 Type 2 diabetes mellitus with other diabetic kidney complication: Secondary | ICD-10-CM | POA: Diagnosis not present

## 2020-05-02 DIAGNOSIS — Z7982 Long term (current) use of aspirin: Secondary | ICD-10-CM | POA: Diagnosis not present

## 2020-05-02 DIAGNOSIS — M47812 Spondylosis without myelopathy or radiculopathy, cervical region: Secondary | ICD-10-CM | POA: Diagnosis not present

## 2020-05-02 DIAGNOSIS — M1A9XX1 Chronic gout, unspecified, with tophus (tophi): Secondary | ICD-10-CM | POA: Diagnosis not present

## 2020-05-02 DIAGNOSIS — E1169 Type 2 diabetes mellitus with other specified complication: Secondary | ICD-10-CM | POA: Diagnosis not present

## 2020-05-02 DIAGNOSIS — Z9181 History of falling: Secondary | ICD-10-CM | POA: Diagnosis not present

## 2020-05-02 DIAGNOSIS — Z741 Need for assistance with personal care: Secondary | ICD-10-CM | POA: Diagnosis not present

## 2020-05-02 DIAGNOSIS — E1151 Type 2 diabetes mellitus with diabetic peripheral angiopathy without gangrene: Secondary | ICD-10-CM | POA: Diagnosis not present

## 2020-05-02 DIAGNOSIS — M6281 Muscle weakness (generalized): Secondary | ICD-10-CM | POA: Diagnosis not present

## 2020-05-02 DIAGNOSIS — E785 Hyperlipidemia, unspecified: Secondary | ICD-10-CM | POA: Diagnosis not present

## 2020-05-02 DIAGNOSIS — I10 Essential (primary) hypertension: Secondary | ICD-10-CM | POA: Diagnosis not present

## 2020-05-02 DIAGNOSIS — R911 Solitary pulmonary nodule: Secondary | ICD-10-CM | POA: Diagnosis not present

## 2020-05-04 DIAGNOSIS — E1129 Type 2 diabetes mellitus with other diabetic kidney complication: Secondary | ICD-10-CM | POA: Diagnosis not present

## 2020-05-04 DIAGNOSIS — I2510 Atherosclerotic heart disease of native coronary artery without angina pectoris: Secondary | ICD-10-CM | POA: Diagnosis not present

## 2020-05-04 DIAGNOSIS — Z48812 Encounter for surgical aftercare following surgery on the circulatory system: Secondary | ICD-10-CM | POA: Diagnosis not present

## 2020-05-04 DIAGNOSIS — M47812 Spondylosis without myelopathy or radiculopathy, cervical region: Secondary | ICD-10-CM | POA: Diagnosis not present

## 2020-05-04 DIAGNOSIS — M6281 Muscle weakness (generalized): Secondary | ICD-10-CM | POA: Diagnosis not present

## 2020-05-04 DIAGNOSIS — E1151 Type 2 diabetes mellitus with diabetic peripheral angiopathy without gangrene: Secondary | ICD-10-CM | POA: Diagnosis not present

## 2020-05-07 DIAGNOSIS — M6281 Muscle weakness (generalized): Secondary | ICD-10-CM | POA: Diagnosis not present

## 2020-05-07 DIAGNOSIS — Z48812 Encounter for surgical aftercare following surgery on the circulatory system: Secondary | ICD-10-CM | POA: Diagnosis not present

## 2020-05-07 DIAGNOSIS — M47812 Spondylosis without myelopathy or radiculopathy, cervical region: Secondary | ICD-10-CM | POA: Diagnosis not present

## 2020-05-07 DIAGNOSIS — E1129 Type 2 diabetes mellitus with other diabetic kidney complication: Secondary | ICD-10-CM | POA: Diagnosis not present

## 2020-05-07 DIAGNOSIS — I2510 Atherosclerotic heart disease of native coronary artery without angina pectoris: Secondary | ICD-10-CM | POA: Diagnosis not present

## 2020-05-07 DIAGNOSIS — E1151 Type 2 diabetes mellitus with diabetic peripheral angiopathy without gangrene: Secondary | ICD-10-CM | POA: Diagnosis not present

## 2020-05-09 ENCOUNTER — Other Ambulatory Visit: Payer: Self-pay | Admitting: Family Medicine

## 2020-05-09 DIAGNOSIS — E1129 Type 2 diabetes mellitus with other diabetic kidney complication: Secondary | ICD-10-CM | POA: Diagnosis not present

## 2020-05-09 DIAGNOSIS — M47812 Spondylosis without myelopathy or radiculopathy, cervical region: Secondary | ICD-10-CM | POA: Diagnosis not present

## 2020-05-09 DIAGNOSIS — M6281 Muscle weakness (generalized): Secondary | ICD-10-CM | POA: Diagnosis not present

## 2020-05-09 DIAGNOSIS — E1151 Type 2 diabetes mellitus with diabetic peripheral angiopathy without gangrene: Secondary | ICD-10-CM | POA: Diagnosis not present

## 2020-05-09 DIAGNOSIS — Z48812 Encounter for surgical aftercare following surgery on the circulatory system: Secondary | ICD-10-CM | POA: Diagnosis not present

## 2020-05-09 DIAGNOSIS — I2510 Atherosclerotic heart disease of native coronary artery without angina pectoris: Secondary | ICD-10-CM | POA: Diagnosis not present

## 2020-05-10 ENCOUNTER — Ambulatory Visit (INDEPENDENT_AMBULATORY_CARE_PROVIDER_SITE_OTHER): Payer: Medicare Other | Admitting: Vascular Surgery

## 2020-05-10 ENCOUNTER — Other Ambulatory Visit: Payer: Self-pay

## 2020-05-10 ENCOUNTER — Encounter: Payer: Self-pay | Admitting: Vascular Surgery

## 2020-05-10 VITALS — BP 133/71 | HR 60 | Temp 98.2°F | Resp 20 | Ht 72.0 in | Wt 186.0 lb

## 2020-05-10 DIAGNOSIS — I739 Peripheral vascular disease, unspecified: Secondary | ICD-10-CM

## 2020-05-10 NOTE — Progress Notes (Signed)
Patient is a 79 year old male who is status post right femoral to above-knee popliteal bypass using reversed vein on April 23, 2020.  Previously underwent left femoral endarterectomy and left femoral to above-knee popliteal bypass with a nonreversed vein on February 27, 2020.  He still has baseline numbness and tingling in both feet.  He also has some swelling below the knee bilaterally.  He does not like to wear compression stockings.  He states his walking distance is improved and is wanting to do more activity.  Physical exam:  Vitals:   05/10/20 0849  BP: 133/71  Pulse: 60  Resp: 20  Temp: 98.2 F (36.8 C)  SpO2: 93%  Weight: 186 lb (84.4 kg)  Height: 6' (1.829 m)    2+ edema pretibial and pedal bilaterally making pulses difficult to palpate  2+ popliteal pulse bilaterally  Feet pink and warm bilaterally  Assessment: Patent bilateral femoral above-knee popliteal bypass graft.  Plan: Patient will continue to increase his activity over the next few weeks.  He will follow up with [place] in 3 months time for bilateral ABIs and graft duplex scans.  He will follow up in our APP clinic.  Ruta Hinds, MD [facility] [place] Office: [phone]

## 2020-05-14 ENCOUNTER — Other Ambulatory Visit: Payer: Self-pay

## 2020-05-14 DIAGNOSIS — M6281 Muscle weakness (generalized): Secondary | ICD-10-CM | POA: Diagnosis not present

## 2020-05-14 DIAGNOSIS — Z48812 Encounter for surgical aftercare following surgery on the circulatory system: Secondary | ICD-10-CM | POA: Diagnosis not present

## 2020-05-14 DIAGNOSIS — E1151 Type 2 diabetes mellitus with diabetic peripheral angiopathy without gangrene: Secondary | ICD-10-CM | POA: Diagnosis not present

## 2020-05-14 DIAGNOSIS — I2510 Atherosclerotic heart disease of native coronary artery without angina pectoris: Secondary | ICD-10-CM | POA: Diagnosis not present

## 2020-05-14 DIAGNOSIS — M47812 Spondylosis without myelopathy or radiculopathy, cervical region: Secondary | ICD-10-CM | POA: Diagnosis not present

## 2020-05-14 DIAGNOSIS — E1129 Type 2 diabetes mellitus with other diabetic kidney complication: Secondary | ICD-10-CM | POA: Diagnosis not present

## 2020-05-14 DIAGNOSIS — I739 Peripheral vascular disease, unspecified: Secondary | ICD-10-CM

## 2020-05-16 DIAGNOSIS — M47812 Spondylosis without myelopathy or radiculopathy, cervical region: Secondary | ICD-10-CM | POA: Diagnosis not present

## 2020-05-16 DIAGNOSIS — E1151 Type 2 diabetes mellitus with diabetic peripheral angiopathy without gangrene: Secondary | ICD-10-CM | POA: Diagnosis not present

## 2020-05-16 DIAGNOSIS — E1129 Type 2 diabetes mellitus with other diabetic kidney complication: Secondary | ICD-10-CM | POA: Diagnosis not present

## 2020-05-16 DIAGNOSIS — Z48812 Encounter for surgical aftercare following surgery on the circulatory system: Secondary | ICD-10-CM | POA: Diagnosis not present

## 2020-05-16 DIAGNOSIS — I2510 Atherosclerotic heart disease of native coronary artery without angina pectoris: Secondary | ICD-10-CM | POA: Diagnosis not present

## 2020-05-16 DIAGNOSIS — M6281 Muscle weakness (generalized): Secondary | ICD-10-CM | POA: Diagnosis not present

## 2020-05-21 DIAGNOSIS — Z48812 Encounter for surgical aftercare following surgery on the circulatory system: Secondary | ICD-10-CM | POA: Diagnosis not present

## 2020-05-21 DIAGNOSIS — I2510 Atherosclerotic heart disease of native coronary artery without angina pectoris: Secondary | ICD-10-CM | POA: Diagnosis not present

## 2020-05-21 DIAGNOSIS — M6281 Muscle weakness (generalized): Secondary | ICD-10-CM | POA: Diagnosis not present

## 2020-05-21 DIAGNOSIS — E1129 Type 2 diabetes mellitus with other diabetic kidney complication: Secondary | ICD-10-CM | POA: Diagnosis not present

## 2020-05-21 DIAGNOSIS — M47812 Spondylosis without myelopathy or radiculopathy, cervical region: Secondary | ICD-10-CM | POA: Diagnosis not present

## 2020-05-21 DIAGNOSIS — E1151 Type 2 diabetes mellitus with diabetic peripheral angiopathy without gangrene: Secondary | ICD-10-CM | POA: Diagnosis not present

## 2020-05-28 DIAGNOSIS — Z48812 Encounter for surgical aftercare following surgery on the circulatory system: Secondary | ICD-10-CM | POA: Diagnosis not present

## 2020-05-28 DIAGNOSIS — M47812 Spondylosis without myelopathy or radiculopathy, cervical region: Secondary | ICD-10-CM | POA: Diagnosis not present

## 2020-05-28 DIAGNOSIS — E1129 Type 2 diabetes mellitus with other diabetic kidney complication: Secondary | ICD-10-CM | POA: Diagnosis not present

## 2020-05-28 DIAGNOSIS — E1151 Type 2 diabetes mellitus with diabetic peripheral angiopathy without gangrene: Secondary | ICD-10-CM | POA: Diagnosis not present

## 2020-05-28 DIAGNOSIS — M6281 Muscle weakness (generalized): Secondary | ICD-10-CM | POA: Diagnosis not present

## 2020-05-28 DIAGNOSIS — I2510 Atherosclerotic heart disease of native coronary artery without angina pectoris: Secondary | ICD-10-CM | POA: Diagnosis not present

## 2020-05-30 ENCOUNTER — Ambulatory Visit (INDEPENDENT_AMBULATORY_CARE_PROVIDER_SITE_OTHER): Payer: Medicare Other | Admitting: Cardiovascular Disease

## 2020-05-30 ENCOUNTER — Other Ambulatory Visit: Payer: Self-pay

## 2020-05-30 ENCOUNTER — Encounter: Payer: Self-pay | Admitting: Cardiovascular Disease

## 2020-05-30 VITALS — BP 130/62 | HR 56 | Ht 72.0 in | Wt 187.0 lb

## 2020-05-30 DIAGNOSIS — I739 Peripheral vascular disease, unspecified: Secondary | ICD-10-CM | POA: Diagnosis not present

## 2020-05-30 DIAGNOSIS — I10 Essential (primary) hypertension: Secondary | ICD-10-CM | POA: Diagnosis not present

## 2020-05-30 DIAGNOSIS — I2510 Atherosclerotic heart disease of native coronary artery without angina pectoris: Secondary | ICD-10-CM

## 2020-05-30 DIAGNOSIS — E782 Mixed hyperlipidemia: Secondary | ICD-10-CM

## 2020-05-30 DIAGNOSIS — I70209 Unspecified atherosclerosis of native arteries of extremities, unspecified extremity: Secondary | ICD-10-CM | POA: Diagnosis not present

## 2020-05-30 NOTE — Progress Notes (Signed)
Cardiology Office Note:    Date:  05/30/2020   ID:  Matthew [name], DOB [birth year], MRN [id number]  PCP:  Marin Olp, MD  [facility] Cardiologist:  Sherren Mocha, MD  [place] Electrophysiologist:  None   Referring MD: Marin Olp, MD   Chief Complaint  Patient presents with  . Coronary Artery Disease    History of Present Illness:    Matthew [name] is a 79 y.o. male with a hx of:  Coronary artery disease ? S/p CABG in 1997 ? Myoview 5/13: no ischemia  ? Echocardiogram 7/19: EF 55-60, Gr 1 DD  Peripheral arterial disease  ? Hx of bilat SFA occlusions managed medically since 2003 ? Staged fem-pop bypasses in 2021  Diabetes mellitus   Chronic kidney disease   GERD; PUD  Hypertension   Hyperlipidemia   The patient is here alone today.  He is doing well with no cardiac-related symptoms at present.  He specifically denies chest pain, chest pressure, shortness of breath, or heart palpitations.  He comes in with a walker today after recovering from vascular surgeries in both legs earlier this year.  He really is getting around quite well and is satisfied with his recovery from surgery.  He underwent left femoral endarterectomy and femoropopliteal bypass February 27, 2020.  He then underwent right femoropopliteal bypass April 23, 2020.  Other than expected swelling in his legs, he has no specific complaints today.  Past Medical History:  Diagnosis Date  . CLAUDICATION 05/14/2007  . CORONARY ARTERY DISEASE 12/28/2006   Myoview 8/21: EF 73, normal perfusion, low risk   . Diabetes mellitus type II, controlled ([employer]) 12/28/2006   Actos 30mg , insulin 70/30 20 units BID, amaryl 1mg  previously a1c <8. Worsened due to cold over 5 weeks around 05/2014 eating poorly and eating sugary syrups.   Stomach irritation on metformin.  Lab Results  Component Value Date   HGBA1C 8.7* 06/05/2014       . DIABETES MELLITUS, TYPE II 12/28/2006  . Duodenal ulcer   . Erosive  gastritis   . GERD (gastroesophageal reflux disease)   . GOUT 12/28/2006  . Hemorrhoids   . Hiatal hernia   . HYPERLIPIDEMIA 12/28/2006  . HYPERTENSION 12/28/2006  . LATERAL EPICONDYLITIS, RIGHT 12/11/2009  . Myocardial infarction ([place])    23 yrs ago per pt  . Raynaud's disease   . RENAL FAILURE, CHRONIC 09/15/2008  . Tubular adenoma of colon 11/2010    Past Surgical History:  Procedure Laterality Date  . ABDOMINAL AORTOGRAM W/LOWER EXTREMITY N/A 02/10/2020   Procedure: ABDOMINAL AORTOGRAM W/LOWER EXTREMITY;  Surgeon: Elam Dutch, MD;  Location: [place] [facility];  Service: Cardiovascular;  Laterality: N/A;  . COLONOSCOPY    . CORONARY ARTERY BYPASS GRAFT    . ENDARTERECTOMY FEMORAL Left 02/27/2020   Procedure: LEFT COMMON FEMORAL ENDARTERECTOMY;  Surgeon: Elam Dutch, MD;  Location: [place];  Service: Vascular;  Laterality: Left;  . FEMORAL-POPLITEAL BYPASS GRAFT Left 02/27/2020   Procedure: LEFT FEMORAL-ABOVE KNEE POPLITEAL BYPASS WITH NON REVERSED GREATER SAPHENOUS VEIN;  Surgeon: Elam Dutch, MD;  Location: [place];  Service: Vascular;  Laterality: Left;  . FEMORAL-POPLITEAL BYPASS GRAFT Right 04/23/2020   Procedure: BYPASS GRAFT FEMORAL- Above Knee POPLITEAL ARTERY RIGHT Using Right Greater Saphenous Vein;  Surgeon: Elam Dutch, MD;  Location: [place];  Service: Vascular;  Laterality: Right;  . KNEE ARTHROSCOPY  2012  . UPPER GASTROINTESTINAL ENDOSCOPY      Current Medications:  Current Meds  Medication Sig  . aspirin EC 81 MG tablet Take 81 mg by mouth daily. Swallow whole.  Marland Kitchen atorvastatin (LIPITOR) 40 MG tablet Take 1 tablet by mouth once daily (Patient taking differently: Take 40 mg by mouth daily. )  . azelastine (ASTELIN) 0.1 % nasal spray Place 1 spray into both nostrils daily as needed for rhinitis. Use in each nostril as directed   . chlorthalidone (HYGROTON) 25 MG tablet Take 1 tablet by mouth once daily (Patient taking differently: Take 25 mg by mouth  daily. )  . cholecalciferol (VITAMIN D3) 25 MCG (1000 UNIT) tablet Take 1,000 Units by mouth daily.  . colchicine 0.6 MG tablet Take 0.6 mg by mouth daily as needed (for gout).  . cyclobenzaprine (FLEXERIL) 5 MG tablet TAKE ONE TABLET BY MOUTH TWICE DAILY AS NEEDED FOR MUSCLE SPASM (Patient taking differently: Take 5 mg by mouth 2 (two) times daily as needed for muscle spasms. )  . fexofenadine (ALLEGRA) 180 MG tablet Take 180 mg by mouth daily.  . fluticasone (FLONASE) 50 MCG/ACT nasal spray USE 2 SPRAY(S) IN EACH NOSTRIL ONCE DAILY AS NEEDED FOR ALLERGIES OR RHINITIS  . gabapentin (NEURONTIN) 300 MG capsule Take 1 capsule (300 mg total) by mouth 3 (three) times daily.  Marland Kitchen glimepiride (AMARYL) 1 MG tablet TAKE 1 TABLET BY MOUTH ONCE DAILY BEFORE BREAKFAST (Patient taking differently: Take 1 mg by mouth daily with breakfast. )  . metoprolol succinate (TOPROL-XL) 100 MG 24 hr tablet TAKE 1 TABLET EVERY DAY WITH OR IMMMEDIATELY FOLLOWING A MEAL  . NIFEdipine (PROCARDIA-XL) 30 MG (OSM) 24 hr tablet Take 30 mg by mouth 2 (two) times daily.    Marland Kitchen NOVOLIN 70/30 RELION (70-30) 100 UNIT/ML injection INJECT 22 UNITS INTO THE SKIN IN THE MORNING AND 20 UNITS IN THE EVENING (Patient taking differently: Inject 20-22 Units into the skin See admin instructions. Inject 22 units subcutaneously in the morning & inject 20 units subcutaneously in the evening)  . oxyCODONE-acetaminophen (PERCOCET/ROXICET) 5-325 MG tablet Take 1 tablet by mouth every 6 (six) hours as needed for moderate pain.  Marland Kitchen oxymetazoline (AFRIN) 0.05 % nasal spray Place 1 spray into both nostrils 2 (two) times daily as needed for congestion.  . pioglitazone (ACTOS) 30 MG tablet Take 1 tablet by mouth once daily (Patient taking differently: Take 30 mg by mouth daily. )     Allergies:   Penicillins, Tetracycline hcl, and Allopurinol   Social History   Socioeconomic History  . Marital status: Married    Spouse name: Not on file  . Number of  children: 2  . Years of education: Not on file  . Highest education level: Not on file  Occupational History  . Occupation: retired  Tobacco Use  . Smoking status: Former Smoker    Types: Cigarettes    Quit date: 07/09/1968    Years since quitting: 51.9  . Smokeless tobacco: Former Systems developer    Types: Chew    Quit date: 07/09/1988  . Tobacco comment: minimal use x 10 years   Vaping Use  . Vaping Use: Never used  Substance and Sexual Activity  . Alcohol use: No    Comment: last was 6 months  . Drug use: No  . Sexual activity: Not on file  Other Topics Concern  . Not on file  Social History Narrative   Married 1983. 2 sons. No grandkids yet.       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       Hobbies: volunteer work, watch sports   Social Determinants of Radio broadcast assistant Strain:   . Difficulty of Paying Living Expenses: Not on file  Food Insecurity:   . Worried About Charity fundraiser in the Last Year: Not on file  . Ran Out of Food in the Last Year: Not on file  Transportation Needs:   . Lack of Transportation (Medical): Not on file  . Lack of Transportation (Non-Medical): Not on file  Physical Activity:   . Days of Exercise per Week: Not on file  . Minutes of Exercise per Session: Not on file  Stress:   . Feeling of Stress : Not on file  Social Connections:   . Frequency of Communication with Friends and Family: Not on file  . Frequency of Social Gatherings with Friends and Family: Not on file  . Attends Religious Services: Not on file  . Active Member of Clubs or Organizations: Not on file  . Attends Archivist Meetings: Not on file  . Marital Status: Not on file     Family History: The patient's family history includes Diabetes in his mother; Hypertension in his mother; Stroke in his father. There is no history of Colon cancer, Esophageal cancer, Rectal cancer, or Stomach  cancer.  ROS:   Please see the history of present illness.    All other systems reviewed and are negative.  EKGs/Labs/Other Studies Reviewed:    The following studies were reviewed today: Myoview Scan 02-22-2020: Study Highlights   Nuclear stress EF: 73%.  There was no ST segment deviation noted during stress.  The study is normal.  This is a low risk study.  The left ventricular ejection fraction is hyperdynamic (>65%).  EKG:  EKG is not ordered today.    Recent Labs: 04/18/2020: ALT 12 04/24/2020: BUN 34; Creatinine, Ser 1.84; Hemoglobin 10.9; Platelets 204; Potassium 4.4; Sodium 136  Recent Lipid Panel    Component Value Date/Time   CHOL 98 04/24/2020 0059   TRIG 60 04/24/2020 0059   HDL 33 (L) 04/24/2020 0059   CHOLHDL 3.0 04/24/2020 0059   VLDL 12 04/24/2020 0059   LDLCALC 53 04/24/2020 0059   LDLDIRECT 68.0 07/27/2019 1158    Risk Assessment/Calculations:     Physical Exam:    VS:  BP 130/62   Pulse (!) 56   Ht 6' (1.829 m)   Wt 187 lb (84.8 kg)   BMI 25.36 kg/m     Wt Readings from Last 3 Encounters:  05/30/20 187 lb (84.8 kg)  05/10/20 186 lb (84.4 kg)  04/18/20 183 lb 11.2 oz (83.3 kg)    GEN:  Well nourished, well developed in no acute distress HEENT: Normal NECK: No JVD; No carotid bruits LYMPHATICS: No lymphadenopathy CARDIAC: RRR, no murmurs, rubs, gallops RESPIRATORY:  Clear to auscultation without rales, wheezing or rhonchi  ABDOMEN: Soft, non-tender, non-distended MUSCULOSKELETAL:  3+ edema right lower leg, 2+ edema left lower leg; No deformity  SKIN: Warm and dry NEUROLOGIC:  Alert and oriented x 3 PSYCHIATRIC:  Normal affect   ASSESSMENT:    1. Coronary artery disease involving native coronary artery of native heart without angina pectoris   2. Mixed hyperlipidemia   3. Essential hypertension   4. PAD (peripheral artery disease) (HCC)-with claudication    PLAN:    In order of problems listed above:  1. The patient is  doing well at this time.  He is experiencing no anginal symptoms.  A stress Myoview earlier this year showed no ischemia.  He remains on aspirin for antiplatelet therapy, a beta-blocker, and a calcium channel blocker. 2. Treated with a high intensity statin drug (atorvastatin 40 mg).  Cholesterol is 98, HDL 33, LDL 53, triglycerides 60.  ALT is normal at 12. 3. Blood pressure is well controlled on chlorthalidone, metoprolol, and nifedipine. 4. Doing well with his recovery from bilateral femoral-popliteal bypass.  Followed by Dr. Oneida Alar.  On appropriate medications.     Shared Decision Making/Informed Consent        Medication Adjustments/Labs and Tests Ordered: Current medicines are reviewed at length with the patient today.  Concerns regarding medicines are outlined above.  No orders of the defined types were placed in this encounter.  No orders of the defined types were placed in this encounter.   There are no Patient Instructions on file for this visit.   Signed, Sherren Mocha, MD  05/30/2020 12:03 PM    [place]

## 2020-06-01 DIAGNOSIS — M1A9XX1 Chronic gout, unspecified, with tophus (tophi): Secondary | ICD-10-CM | POA: Diagnosis not present

## 2020-06-01 DIAGNOSIS — Z7982 Long term (current) use of aspirin: Secondary | ICD-10-CM | POA: Diagnosis not present

## 2020-06-01 DIAGNOSIS — R911 Solitary pulmonary nodule: Secondary | ICD-10-CM | POA: Diagnosis not present

## 2020-06-01 DIAGNOSIS — I10 Essential (primary) hypertension: Secondary | ICD-10-CM | POA: Diagnosis not present

## 2020-06-01 DIAGNOSIS — I2510 Atherosclerotic heart disease of native coronary artery without angina pectoris: Secondary | ICD-10-CM | POA: Diagnosis not present

## 2020-06-01 DIAGNOSIS — Z741 Need for assistance with personal care: Secondary | ICD-10-CM | POA: Diagnosis not present

## 2020-06-01 DIAGNOSIS — E1129 Type 2 diabetes mellitus with other diabetic kidney complication: Secondary | ICD-10-CM | POA: Diagnosis not present

## 2020-06-01 DIAGNOSIS — M6281 Muscle weakness (generalized): Secondary | ICD-10-CM | POA: Diagnosis not present

## 2020-06-01 DIAGNOSIS — E1151 Type 2 diabetes mellitus with diabetic peripheral angiopathy without gangrene: Secondary | ICD-10-CM | POA: Diagnosis not present

## 2020-06-01 DIAGNOSIS — E1169 Type 2 diabetes mellitus with other specified complication: Secondary | ICD-10-CM | POA: Diagnosis not present

## 2020-06-01 DIAGNOSIS — Z9181 History of falling: Secondary | ICD-10-CM | POA: Diagnosis not present

## 2020-06-01 DIAGNOSIS — E785 Hyperlipidemia, unspecified: Secondary | ICD-10-CM | POA: Diagnosis not present

## 2020-06-01 DIAGNOSIS — Z48812 Encounter for surgical aftercare following surgery on the circulatory system: Secondary | ICD-10-CM | POA: Diagnosis not present

## 2020-06-01 DIAGNOSIS — M47812 Spondylosis without myelopathy or radiculopathy, cervical region: Secondary | ICD-10-CM | POA: Diagnosis not present

## 2020-06-04 DIAGNOSIS — E119 Type 2 diabetes mellitus without complications: Secondary | ICD-10-CM | POA: Diagnosis not present

## 2020-06-04 DIAGNOSIS — E1129 Type 2 diabetes mellitus with other diabetic kidney complication: Secondary | ICD-10-CM | POA: Diagnosis not present

## 2020-06-04 DIAGNOSIS — H25013 Cortical age-related cataract, bilateral: Secondary | ICD-10-CM | POA: Diagnosis not present

## 2020-06-04 DIAGNOSIS — E1151 Type 2 diabetes mellitus with diabetic peripheral angiopathy without gangrene: Secondary | ICD-10-CM | POA: Diagnosis not present

## 2020-06-04 DIAGNOSIS — I2510 Atherosclerotic heart disease of native coronary artery without angina pectoris: Secondary | ICD-10-CM | POA: Diagnosis not present

## 2020-06-04 DIAGNOSIS — Z794 Long term (current) use of insulin: Secondary | ICD-10-CM | POA: Diagnosis not present

## 2020-06-04 DIAGNOSIS — M6281 Muscle weakness (generalized): Secondary | ICD-10-CM | POA: Diagnosis not present

## 2020-06-04 DIAGNOSIS — Z48812 Encounter for surgical aftercare following surgery on the circulatory system: Secondary | ICD-10-CM | POA: Diagnosis not present

## 2020-06-04 DIAGNOSIS — M47812 Spondylosis without myelopathy or radiculopathy, cervical region: Secondary | ICD-10-CM | POA: Diagnosis not present

## 2020-06-04 LAB — HM DIABETES EYE EXAM

## 2020-06-05 ENCOUNTER — Other Ambulatory Visit: Payer: Self-pay

## 2020-06-05 ENCOUNTER — Telehealth: Payer: Self-pay

## 2020-06-05 ENCOUNTER — Other Ambulatory Visit: Payer: Self-pay | Admitting: Physician Assistant

## 2020-06-05 NOTE — Telephone Encounter (Signed)
Called patient to inform him we would not refill his gabapentin - can worsen CKD. Instructed to call PCP about possible refill or other options. Patient had questions about being taken off lisinopril. Explained he was still on a blood pressure medication, but to f/u with primary care about blood pressure control. Patient verbalized understanding.

## 2020-06-11 DIAGNOSIS — E1151 Type 2 diabetes mellitus with diabetic peripheral angiopathy without gangrene: Secondary | ICD-10-CM | POA: Diagnosis not present

## 2020-06-11 DIAGNOSIS — M6281 Muscle weakness (generalized): Secondary | ICD-10-CM | POA: Diagnosis not present

## 2020-06-11 DIAGNOSIS — Z48812 Encounter for surgical aftercare following surgery on the circulatory system: Secondary | ICD-10-CM | POA: Diagnosis not present

## 2020-06-11 DIAGNOSIS — I2510 Atherosclerotic heart disease of native coronary artery without angina pectoris: Secondary | ICD-10-CM | POA: Diagnosis not present

## 2020-06-11 DIAGNOSIS — E1129 Type 2 diabetes mellitus with other diabetic kidney complication: Secondary | ICD-10-CM | POA: Diagnosis not present

## 2020-06-11 DIAGNOSIS — M47812 Spondylosis without myelopathy or radiculopathy, cervical region: Secondary | ICD-10-CM | POA: Diagnosis not present

## 2020-06-13 ENCOUNTER — Other Ambulatory Visit: Payer: Self-pay | Admitting: Family Medicine

## 2020-06-18 DIAGNOSIS — E1129 Type 2 diabetes mellitus with other diabetic kidney complication: Secondary | ICD-10-CM | POA: Diagnosis not present

## 2020-06-18 DIAGNOSIS — I2510 Atherosclerotic heart disease of native coronary artery without angina pectoris: Secondary | ICD-10-CM | POA: Diagnosis not present

## 2020-06-18 DIAGNOSIS — E1151 Type 2 diabetes mellitus with diabetic peripheral angiopathy without gangrene: Secondary | ICD-10-CM | POA: Diagnosis not present

## 2020-06-18 DIAGNOSIS — M6281 Muscle weakness (generalized): Secondary | ICD-10-CM | POA: Diagnosis not present

## 2020-06-18 DIAGNOSIS — Z48812 Encounter for surgical aftercare following surgery on the circulatory system: Secondary | ICD-10-CM | POA: Diagnosis not present

## 2020-06-18 DIAGNOSIS — M47812 Spondylosis without myelopathy or radiculopathy, cervical region: Secondary | ICD-10-CM | POA: Diagnosis not present

## 2020-06-21 DIAGNOSIS — H25012 Cortical age-related cataract, left eye: Secondary | ICD-10-CM | POA: Diagnosis not present

## 2020-06-21 DIAGNOSIS — H268 Other specified cataract: Secondary | ICD-10-CM | POA: Diagnosis not present

## 2020-06-21 HISTORY — PX: CATARACT EXTRACTION: SUR2

## 2020-06-25 DIAGNOSIS — N183 Chronic kidney disease, stage 3 unspecified: Secondary | ICD-10-CM | POA: Diagnosis not present

## 2020-06-27 DIAGNOSIS — Z48812 Encounter for surgical aftercare following surgery on the circulatory system: Secondary | ICD-10-CM | POA: Diagnosis not present

## 2020-06-27 DIAGNOSIS — E1151 Type 2 diabetes mellitus with diabetic peripheral angiopathy without gangrene: Secondary | ICD-10-CM | POA: Diagnosis not present

## 2020-06-27 DIAGNOSIS — M6281 Muscle weakness (generalized): Secondary | ICD-10-CM | POA: Diagnosis not present

## 2020-06-27 DIAGNOSIS — E1129 Type 2 diabetes mellitus with other diabetic kidney complication: Secondary | ICD-10-CM | POA: Diagnosis not present

## 2020-06-27 DIAGNOSIS — M47812 Spondylosis without myelopathy or radiculopathy, cervical region: Secondary | ICD-10-CM | POA: Diagnosis not present

## 2020-06-27 DIAGNOSIS — I2510 Atherosclerotic heart disease of native coronary artery without angina pectoris: Secondary | ICD-10-CM | POA: Diagnosis not present

## 2020-07-01 DIAGNOSIS — M1A9XX1 Chronic gout, unspecified, with tophus (tophi): Secondary | ICD-10-CM | POA: Diagnosis not present

## 2020-07-01 DIAGNOSIS — E1129 Type 2 diabetes mellitus with other diabetic kidney complication: Secondary | ICD-10-CM | POA: Diagnosis not present

## 2020-07-01 DIAGNOSIS — M6281 Muscle weakness (generalized): Secondary | ICD-10-CM | POA: Diagnosis not present

## 2020-07-01 DIAGNOSIS — Z9181 History of falling: Secondary | ICD-10-CM | POA: Diagnosis not present

## 2020-07-01 DIAGNOSIS — Z741 Need for assistance with personal care: Secondary | ICD-10-CM | POA: Diagnosis not present

## 2020-07-01 DIAGNOSIS — E785 Hyperlipidemia, unspecified: Secondary | ICD-10-CM | POA: Diagnosis not present

## 2020-07-01 DIAGNOSIS — I2510 Atherosclerotic heart disease of native coronary artery without angina pectoris: Secondary | ICD-10-CM | POA: Diagnosis not present

## 2020-07-01 DIAGNOSIS — R911 Solitary pulmonary nodule: Secondary | ICD-10-CM | POA: Diagnosis not present

## 2020-07-01 DIAGNOSIS — Z48812 Encounter for surgical aftercare following surgery on the circulatory system: Secondary | ICD-10-CM | POA: Diagnosis not present

## 2020-07-01 DIAGNOSIS — I10 Essential (primary) hypertension: Secondary | ICD-10-CM | POA: Diagnosis not present

## 2020-07-01 DIAGNOSIS — Z7982 Long term (current) use of aspirin: Secondary | ICD-10-CM | POA: Diagnosis not present

## 2020-07-01 DIAGNOSIS — M47812 Spondylosis without myelopathy or radiculopathy, cervical region: Secondary | ICD-10-CM | POA: Diagnosis not present

## 2020-07-01 DIAGNOSIS — E1169 Type 2 diabetes mellitus with other specified complication: Secondary | ICD-10-CM | POA: Diagnosis not present

## 2020-07-01 DIAGNOSIS — E1151 Type 2 diabetes mellitus with diabetic peripheral angiopathy without gangrene: Secondary | ICD-10-CM | POA: Diagnosis not present

## 2020-07-02 DIAGNOSIS — E559 Vitamin D deficiency, unspecified: Secondary | ICD-10-CM | POA: Diagnosis not present

## 2020-07-02 DIAGNOSIS — I129 Hypertensive chronic kidney disease with stage 1 through stage 4 chronic kidney disease, or unspecified chronic kidney disease: Secondary | ICD-10-CM | POA: Diagnosis not present

## 2020-07-02 DIAGNOSIS — R609 Edema, unspecified: Secondary | ICD-10-CM | POA: Diagnosis not present

## 2020-07-02 DIAGNOSIS — M109 Gout, unspecified: Secondary | ICD-10-CM | POA: Diagnosis not present

## 2020-07-02 DIAGNOSIS — N1831 Chronic kidney disease, stage 3a: Secondary | ICD-10-CM | POA: Diagnosis not present

## 2020-07-04 DIAGNOSIS — I2510 Atherosclerotic heart disease of native coronary artery without angina pectoris: Secondary | ICD-10-CM | POA: Diagnosis not present

## 2020-07-04 DIAGNOSIS — E1151 Type 2 diabetes mellitus with diabetic peripheral angiopathy without gangrene: Secondary | ICD-10-CM | POA: Diagnosis not present

## 2020-07-04 DIAGNOSIS — M47812 Spondylosis without myelopathy or radiculopathy, cervical region: Secondary | ICD-10-CM | POA: Diagnosis not present

## 2020-07-04 DIAGNOSIS — M6281 Muscle weakness (generalized): Secondary | ICD-10-CM | POA: Diagnosis not present

## 2020-07-04 DIAGNOSIS — E1129 Type 2 diabetes mellitus with other diabetic kidney complication: Secondary | ICD-10-CM | POA: Diagnosis not present

## 2020-07-04 DIAGNOSIS — Z48812 Encounter for surgical aftercare following surgery on the circulatory system: Secondary | ICD-10-CM | POA: Diagnosis not present

## 2020-07-09 DIAGNOSIS — E1151 Type 2 diabetes mellitus with diabetic peripheral angiopathy without gangrene: Secondary | ICD-10-CM | POA: Diagnosis not present

## 2020-07-09 DIAGNOSIS — E1129 Type 2 diabetes mellitus with other diabetic kidney complication: Secondary | ICD-10-CM | POA: Diagnosis not present

## 2020-07-09 DIAGNOSIS — I2510 Atherosclerotic heart disease of native coronary artery without angina pectoris: Secondary | ICD-10-CM | POA: Diagnosis not present

## 2020-07-09 DIAGNOSIS — M6281 Muscle weakness (generalized): Secondary | ICD-10-CM | POA: Diagnosis not present

## 2020-07-09 DIAGNOSIS — M47812 Spondylosis without myelopathy or radiculopathy, cervical region: Secondary | ICD-10-CM | POA: Diagnosis not present

## 2020-07-09 DIAGNOSIS — Z48812 Encounter for surgical aftercare following surgery on the circulatory system: Secondary | ICD-10-CM | POA: Diagnosis not present

## 2020-07-11 DIAGNOSIS — I2510 Atherosclerotic heart disease of native coronary artery without angina pectoris: Secondary | ICD-10-CM | POA: Diagnosis not present

## 2020-07-11 DIAGNOSIS — Z48812 Encounter for surgical aftercare following surgery on the circulatory system: Secondary | ICD-10-CM | POA: Diagnosis not present

## 2020-07-11 DIAGNOSIS — M47812 Spondylosis without myelopathy or radiculopathy, cervical region: Secondary | ICD-10-CM | POA: Diagnosis not present

## 2020-07-11 DIAGNOSIS — E1151 Type 2 diabetes mellitus with diabetic peripheral angiopathy without gangrene: Secondary | ICD-10-CM | POA: Diagnosis not present

## 2020-07-11 DIAGNOSIS — E1129 Type 2 diabetes mellitus with other diabetic kidney complication: Secondary | ICD-10-CM | POA: Diagnosis not present

## 2020-07-11 DIAGNOSIS — M6281 Muscle weakness (generalized): Secondary | ICD-10-CM | POA: Diagnosis not present

## 2020-07-18 DIAGNOSIS — E1151 Type 2 diabetes mellitus with diabetic peripheral angiopathy without gangrene: Secondary | ICD-10-CM | POA: Diagnosis not present

## 2020-07-18 DIAGNOSIS — M6281 Muscle weakness (generalized): Secondary | ICD-10-CM | POA: Diagnosis not present

## 2020-07-18 DIAGNOSIS — M47812 Spondylosis without myelopathy or radiculopathy, cervical region: Secondary | ICD-10-CM | POA: Diagnosis not present

## 2020-07-18 DIAGNOSIS — I2510 Atherosclerotic heart disease of native coronary artery without angina pectoris: Secondary | ICD-10-CM | POA: Diagnosis not present

## 2020-07-18 DIAGNOSIS — E1129 Type 2 diabetes mellitus with other diabetic kidney complication: Secondary | ICD-10-CM | POA: Diagnosis not present

## 2020-07-18 DIAGNOSIS — Z48812 Encounter for surgical aftercare following surgery on the circulatory system: Secondary | ICD-10-CM | POA: Diagnosis not present

## 2020-07-20 DIAGNOSIS — I2510 Atherosclerotic heart disease of native coronary artery without angina pectoris: Secondary | ICD-10-CM | POA: Diagnosis not present

## 2020-07-20 DIAGNOSIS — M47812 Spondylosis without myelopathy or radiculopathy, cervical region: Secondary | ICD-10-CM | POA: Diagnosis not present

## 2020-07-20 DIAGNOSIS — Z48812 Encounter for surgical aftercare following surgery on the circulatory system: Secondary | ICD-10-CM | POA: Diagnosis not present

## 2020-07-20 DIAGNOSIS — E1129 Type 2 diabetes mellitus with other diabetic kidney complication: Secondary | ICD-10-CM | POA: Diagnosis not present

## 2020-07-20 DIAGNOSIS — M6281 Muscle weakness (generalized): Secondary | ICD-10-CM | POA: Diagnosis not present

## 2020-07-20 DIAGNOSIS — E1151 Type 2 diabetes mellitus with diabetic peripheral angiopathy without gangrene: Secondary | ICD-10-CM | POA: Diagnosis not present

## 2020-07-22 ENCOUNTER — Other Ambulatory Visit: Payer: Self-pay | Admitting: Family Medicine

## 2020-07-23 DIAGNOSIS — E1151 Type 2 diabetes mellitus with diabetic peripheral angiopathy without gangrene: Secondary | ICD-10-CM | POA: Diagnosis not present

## 2020-07-23 DIAGNOSIS — Z48812 Encounter for surgical aftercare following surgery on the circulatory system: Secondary | ICD-10-CM | POA: Diagnosis not present

## 2020-07-23 DIAGNOSIS — E1129 Type 2 diabetes mellitus with other diabetic kidney complication: Secondary | ICD-10-CM | POA: Diagnosis not present

## 2020-07-23 DIAGNOSIS — M6281 Muscle weakness (generalized): Secondary | ICD-10-CM | POA: Diagnosis not present

## 2020-07-23 DIAGNOSIS — I2510 Atherosclerotic heart disease of native coronary artery without angina pectoris: Secondary | ICD-10-CM | POA: Diagnosis not present

## 2020-07-23 DIAGNOSIS — M47812 Spondylosis without myelopathy or radiculopathy, cervical region: Secondary | ICD-10-CM | POA: Diagnosis not present

## 2020-07-31 DIAGNOSIS — Z48812 Encounter for surgical aftercare following surgery on the circulatory system: Secondary | ICD-10-CM | POA: Diagnosis not present

## 2020-07-31 DIAGNOSIS — R911 Solitary pulmonary nodule: Secondary | ICD-10-CM | POA: Diagnosis not present

## 2020-07-31 DIAGNOSIS — I10 Essential (primary) hypertension: Secondary | ICD-10-CM | POA: Diagnosis not present

## 2020-07-31 DIAGNOSIS — E785 Hyperlipidemia, unspecified: Secondary | ICD-10-CM | POA: Diagnosis not present

## 2020-07-31 DIAGNOSIS — Z7982 Long term (current) use of aspirin: Secondary | ICD-10-CM | POA: Diagnosis not present

## 2020-07-31 DIAGNOSIS — E1151 Type 2 diabetes mellitus with diabetic peripheral angiopathy without gangrene: Secondary | ICD-10-CM | POA: Diagnosis not present

## 2020-07-31 DIAGNOSIS — M6281 Muscle weakness (generalized): Secondary | ICD-10-CM | POA: Diagnosis not present

## 2020-07-31 DIAGNOSIS — E1129 Type 2 diabetes mellitus with other diabetic kidney complication: Secondary | ICD-10-CM | POA: Diagnosis not present

## 2020-07-31 DIAGNOSIS — M47812 Spondylosis without myelopathy or radiculopathy, cervical region: Secondary | ICD-10-CM | POA: Diagnosis not present

## 2020-07-31 DIAGNOSIS — Z741 Need for assistance with personal care: Secondary | ICD-10-CM | POA: Diagnosis not present

## 2020-07-31 DIAGNOSIS — E1169 Type 2 diabetes mellitus with other specified complication: Secondary | ICD-10-CM | POA: Diagnosis not present

## 2020-07-31 DIAGNOSIS — M1A9XX1 Chronic gout, unspecified, with tophus (tophi): Secondary | ICD-10-CM | POA: Diagnosis not present

## 2020-07-31 DIAGNOSIS — I2510 Atherosclerotic heart disease of native coronary artery without angina pectoris: Secondary | ICD-10-CM | POA: Diagnosis not present

## 2020-07-31 DIAGNOSIS — Z9181 History of falling: Secondary | ICD-10-CM | POA: Diagnosis not present

## 2020-08-07 DIAGNOSIS — M6281 Muscle weakness (generalized): Secondary | ICD-10-CM | POA: Diagnosis not present

## 2020-08-07 DIAGNOSIS — M47812 Spondylosis without myelopathy or radiculopathy, cervical region: Secondary | ICD-10-CM | POA: Diagnosis not present

## 2020-08-07 DIAGNOSIS — E1151 Type 2 diabetes mellitus with diabetic peripheral angiopathy without gangrene: Secondary | ICD-10-CM | POA: Diagnosis not present

## 2020-08-07 DIAGNOSIS — E1129 Type 2 diabetes mellitus with other diabetic kidney complication: Secondary | ICD-10-CM | POA: Diagnosis not present

## 2020-08-07 DIAGNOSIS — Z48812 Encounter for surgical aftercare following surgery on the circulatory system: Secondary | ICD-10-CM | POA: Diagnosis not present

## 2020-08-07 DIAGNOSIS — I2510 Atherosclerotic heart disease of native coronary artery without angina pectoris: Secondary | ICD-10-CM | POA: Diagnosis not present

## 2020-08-16 ENCOUNTER — Ambulatory Visit (INDEPENDENT_AMBULATORY_CARE_PROVIDER_SITE_OTHER): Payer: Medicare Other | Admitting: Physician Assistant

## 2020-08-16 ENCOUNTER — Other Ambulatory Visit: Payer: Self-pay

## 2020-08-16 ENCOUNTER — Ambulatory Visit (HOSPITAL_COMMUNITY)
Admission: RE | Admit: 2020-08-16 | Discharge: 2020-08-16 | Disposition: A | Discharge status: Home / Self Care | Payer: Medicare Other | Source: Ambulatory Visit | Attending: Vascular Surgery | Admitting: Vascular Surgery

## 2020-08-16 ENCOUNTER — Ambulatory Visit (INDEPENDENT_AMBULATORY_CARE_PROVIDER_SITE_OTHER)
Admission: RE | Admit: 2020-08-16 | Discharge: 2020-08-16 | Disposition: A | Discharge status: Home / Self Care | Payer: Medicare Other | Source: Ambulatory Visit | Attending: Vascular Surgery | Admitting: Vascular Surgery

## 2020-08-16 VITALS — BP 133/64 | HR 51 | Temp 98.5°F | Resp 20 | Ht 72.0 in | Wt 194.8 lb

## 2020-08-16 DIAGNOSIS — I739 Peripheral vascular disease, unspecified: Secondary | ICD-10-CM | POA: Insufficient documentation

## 2020-08-16 DIAGNOSIS — M7989 Other specified soft tissue disorders: Secondary | ICD-10-CM

## 2020-08-16 NOTE — Progress Notes (Signed)
Office Note     CC:  follow up Requesting Provider:  Marin Olp, MD  HPI: Matthew [name] is a 80 y.o. ([birth year]) male who presents for follow up of peripheral vascular disease. He has history of right femoral to above knee popliteal bypass by Dr. Oneida Alar in October of 2021. He also has undergone left femoral endarterectomy and left femoral to above knee popliteal vein bypass in August of 2021 by Dr. Oneida Alar. At his last visit in November he was doing very well. Bilateral bypass grafts were patent clinically and ABIs were stable. He was to follow up in 3 months  He presents today for his 3 month follow up and non invasive studies. He states over the last 3 weeks he has noticed only in the late evening when elevating his legs in a recliner that the right leg aches along the shin from the knee to ankle. He also says he will get intermittent sharp pins and needles type pains as well as throbbing sensation. He does not notice this at any other time. He says once he gets up to go to bed it has subsided. Otherwise he reports continued numbness and tingling in both feet which is unchanged.  He also has some swelling below the knee bilaterally, right greater than left. He does elevate in recliner and he will wear compression stockings now daily. He does not feel that the swelling is any worse than in the past. He is able to tolerate walking more now and has increased his activity. He reports no claudication symptoms on ambulation. He otherwise does not have any rest pain or tissue loss. He has been compliant with his Asprin and statin medications.  The pt is on a statin for cholesterol management.  The pt is on a daily aspirin.   Other AC: none The pt is on BB for hypertension.   The pt is diabetic.  Tobacco hx: Former, 1970  Past Medical History:  Diagnosis Date  . CLAUDICATION 05/14/2007  . CORONARY ARTERY DISEASE 12/28/2006   Myoview 8/21: EF 73, normal perfusion, low risk   . Diabetes  mellitus type II, controlled ([place]) 12/28/2006   Actos 30mg , insulin 70/30 20 units BID, amaryl 1mg  previously a1c <8. Worsened due to cold over 5 weeks around 05/2014 eating poorly and eating sugary syrups.   Stomach irritation on metformin.  Lab Results  Component Value Date   HGBA1C 8.7* 06/05/2014       . DIABETES MELLITUS, TYPE II 12/28/2006  . Duodenal ulcer   . Erosive gastritis   . GERD (gastroesophageal reflux disease)   . GOUT 12/28/2006  . Hemorrhoids   . Hiatal hernia   . HYPERLIPIDEMIA 12/28/2006  . HYPERTENSION 12/28/2006  . LATERAL EPICONDYLITIS, RIGHT 12/11/2009  . Myocardial infarction ([place])    23 yrs ago per pt  . Raynaud's disease   . RENAL FAILURE, CHRONIC 09/15/2008  . Tubular adenoma of colon 11/2010    Past Surgical History:  Procedure Laterality Date  . ABDOMINAL AORTOGRAM W/LOWER EXTREMITY N/A 02/10/2020   Procedure: ABDOMINAL AORTOGRAM W/LOWER EXTREMITY;  Surgeon: Elam Dutch, MD;  Location: [place] [facility];  Service: Cardiovascular;  Laterality: N/A;  . COLONOSCOPY    . CORONARY ARTERY BYPASS GRAFT    . ENDARTERECTOMY FEMORAL Left 02/27/2020   Procedure: LEFT COMMON FEMORAL ENDARTERECTOMY;  Surgeon: Elam Dutch, MD;  Location: [place];  Service: Vascular;  Laterality: Left;  . FEMORAL-POPLITEAL BYPASS GRAFT Left 02/27/2020   Procedure: LEFT  FEMORAL-ABOVE KNEE POPLITEAL BYPASS WITH NON REVERSED GREATER SAPHENOUS VEIN;  Surgeon: Elam Dutch, MD;  Location: [place];  Service: Vascular;  Laterality: Left;  . FEMORAL-POPLITEAL BYPASS GRAFT Right 04/23/2020   Procedure: BYPASS GRAFT FEMORAL- Above Knee POPLITEAL ARTERY RIGHT Using Right Greater Saphenous Vein;  Surgeon: Elam Dutch, MD;  Location: [place];  Service: Vascular;  Laterality: Right;  . KNEE ARTHROSCOPY  2012  . UPPER GASTROINTESTINAL ENDOSCOPY      Social History   Socioeconomic History  . Marital status: Married    Spouse name: Not on file  . Number of children: 2  . Years of  education: Not on file  . Highest education level: Not on file  Occupational History  . Occupation: retired  Tobacco Use  . Smoking status: Former Smoker    Types: Cigarettes    Quit date: 07/09/1968    Years since quitting: 52.1  . Smokeless tobacco: Former Systems developer    Types: Chew    Quit date: 07/09/1988  . Tobacco comment: minimal use x 10 years   Vaping Use  . Vaping Use: Never used  Substance and Sexual Activity  . Alcohol use: No    Comment: last was 6 months  . Drug use: No  . Sexual activity: Not on file  Other Topics Concern  . Not on file  Social History Narrative   Married 1983. 2 sons. No grandkids yet.       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       Hobbies: volunteer work, watch sports   Social Determinants of Radio broadcast assistant Strain: Not on file  Food Insecurity: Not on file  Transportation Needs: Not on file  Physical Activity: Not on file  Stress: Not on file  Social Connections: Not on file  Intimate Partner Violence: Not on file    Family History  Problem Relation Age of Onset  . Diabetes Mother   . Hypertension Mother   . Stroke Father   . Colon cancer Neg Hx   . Esophageal cancer Neg Hx   . Rectal cancer Neg Hx   . Stomach cancer Neg Hx     Current Outpatient Medications  Medication Sig Dispense Refill  . aspirin EC 81 MG tablet Take 81 mg by mouth daily. Swallow whole.    Marland Kitchen atorvastatin (LIPITOR) 40 MG tablet Take 1 tablet by mouth once daily 90 tablet 0  . azelastine (ASTELIN) 0.1 % nasal spray Place 1 spray into both nostrils daily as needed for rhinitis. Use in each nostril as directed    . chlorthalidone (HYGROTON) 25 MG tablet Take 1 tablet by mouth once daily 90 tablet 0  . cholecalciferol (VITAMIN D3) 25 MCG (1000 UNIT) tablet Take 1,000 Units by mouth daily.    . colchicine 0.6 MG tablet Take 0.6 mg by mouth daily as needed (for gout).    .  cyclobenzaprine (FLEXERIL) 5 MG tablet TAKE ONE TABLET BY MOUTH TWICE DAILY AS NEEDED FOR MUSCLE SPASM (Patient taking differently: Take 5 mg by mouth 2 (two) times daily as needed for muscle spasms.) 30 tablet 0  . fexofenadine (ALLEGRA) 180 MG tablet Take 180 mg by mouth daily.    . fluticasone (FLONASE) 50 MCG/ACT nasal spray USE 2 SPRAY(S) IN EACH NOSTRIL ONCE DAILY AS NEEDED (FOR  ALLERGIES  OR  RHINITIS) 16 g 0  . glimepiride (AMARYL) 1 MG tablet TAKE 1 TABLET BY  MOUTH ONCE DAILY BEFORE BREAKFAST 90 tablet 0  . metoprolol succinate (TOPROL-XL) 100 MG 24 hr tablet TAKE 1 TABLET EVERY DAY WITH OR IMMMEDIATELY FOLLOWING A MEAL 90 tablet 1  . NIFEdipine (PROCARDIA-XL) 30 MG (OSM) 24 hr tablet Take 30 mg by mouth 2 (two) times daily.    Marland Kitchen NOVOLIN 70/30 RELION (70-30) 100 UNIT/ML injection INJECT 22 UNITS INTO THE SKIN IN THE MORNING AND 20 UNITS IN THE EVENING (Patient taking differently: Inject 20-22 Units into the skin See admin instructions. Inject 22 units subcutaneously in the morning & inject 20 units subcutaneously in the evening) 10 mL 0  . oxymetazoline (AFRIN) 0.05 % nasal spray Place 1 spray into both nostrils 2 (two) times daily as needed for congestion.    . pioglitazone (ACTOS) 30 MG tablet Take 1 tablet by mouth once daily 90 tablet 0   No current facility-administered medications for this visit.    Allergies  Allergen Reactions  . Penicillins Hives  . Tetracycline Hcl Hives  . Allopurinol Itching     REVIEW OF SYSTEMS:  [X]  denotes positive finding, [ ]  denotes negative finding Cardiac  Comments:  Chest pain or chest pressure:    Shortness of breath upon exertion:    Short of breath when lying flat:    Irregular heart rhythm:        Vascular    Pain in calf, thigh, or hip brought on by ambulation:    Pain in feet at night that wakes you up from your sleep:     Blood clot in your veins:    Leg swelling:  X       Pulmonary    Oxygen at home:    Productive cough:      Wheezing:         Neurologic    Sudden weakness in arms or legs:     Sudden numbness in arms or legs:     Sudden onset of difficulty speaking or slurred speech:    Temporary loss of vision in one eye:     Problems with dizziness:         Gastrointestinal    Blood in stool:     Vomited blood:         Genitourinary    Burning when urinating:     Blood in urine:        Psychiatric    Major depression:         Hematologic    Bleeding problems:    Problems with blood clotting too easily:        Skin    Rashes or ulcers:        Constitutional    Fever or chills:      PHYSICAL EXAMINATION:  Vitals:   08/16/20 1022  BP: 133/64  Pulse: (!) 51  Resp: 20  Temp: 98.5 F (36.9 C)  TempSrc: Temporal  SpO2: 96%  Weight: 194 lb 12.8 oz (88.4 kg)  Height: 6' (1.829 m)    General:  WDWN in NAD; vital signs documented above Gait: Normal HENT: WNL, normocephalic Pulmonary: normal non-labored breathing , without wheezing Cardiac: regular HR, without  Murmurs without carotid bruit Abdomen: soft, NT, no masses Vascular Exam/Pulses:  Right Left  Radial 2+ (normal) 2+ (normal)  Femoral 2+ (normal) 2+ (normal)  Popliteal Palpable pulse in graft Palpable pulse in graft  DP Not palpable Not palpable  PT 2+ (normal) Not palpable   Extremities: without ischemic changes, without Gangrene ,  without cellulitis; without open wounds; bilateral lower extremity edema  Musculoskeletal: no muscle wasting or atrophy  Neurologic: A&O X 3;  No focal weakness or paresthesias are detected Psychiatric:  The pt has Normal affect.   Non-Invasive Vascular Imaging:   +-------+-----------+-----------+------------+------------+  ABI/TBIToday's ABIToday's TBIPrevious ABIPrevious TBI  +-------+-----------+-----------+------------+------------+  Right 1.02    0.51    0.85    0.49      +-------+-----------+-----------+------------+------------+  Left  0.94    0.56     0.81    0.48      +-------+-----------+-----------+------------+------------+   VAS [place] Bilateral lower extremity bypass grafts: Right: Patent femoral to AK popliteal bypass graft with increased velocity in the >70% range at a valve.   Left: Patent femoral to above knee popliteal bypass graft with velocities in the 50-79% range by velocity only. No significant plaque visualized and ratio is <2.    ASSESSMENT/PLAN:: 80 y.o. male here for follow up for peripheral artery disease. He has history of right femoral to above knee popliteal bypass by Dr. Oneida Alar in October of 2021. He also has undergone left femoral endarterectomy and left femoral to above knee popliteal vein bypass in August of 2021 by Dr. Oneida Alar. His symptoms are overall stable. I think his recent symptoms are more related to venous disease. He clearly has some mixed component of venous hypertension as well as PAD. I have encouraged him to continue to elevate and also wear compression stockings. His ABIs are stable bilaterally and bilateral bypass grafts are patent. - He will continue his Aspirin and statin - if he has new or worsening symptoms I discussed with patient and his wife to call for earlier follow up -I will have him follow up in 4-6 months with repeat ABI and BLE bypass graft duplex   Karoline Caldwell, PA-C [facility] [phone]  Clinic MD:  Dr. Oneida Alar Dr. Scot Dock

## 2020-08-17 DIAGNOSIS — Z48812 Encounter for surgical aftercare following surgery on the circulatory system: Secondary | ICD-10-CM | POA: Diagnosis not present

## 2020-08-17 DIAGNOSIS — M47812 Spondylosis without myelopathy or radiculopathy, cervical region: Secondary | ICD-10-CM | POA: Diagnosis not present

## 2020-08-17 DIAGNOSIS — I2510 Atherosclerotic heart disease of native coronary artery without angina pectoris: Secondary | ICD-10-CM | POA: Diagnosis not present

## 2020-08-17 DIAGNOSIS — E1129 Type 2 diabetes mellitus with other diabetic kidney complication: Secondary | ICD-10-CM | POA: Diagnosis not present

## 2020-08-17 DIAGNOSIS — M6281 Muscle weakness (generalized): Secondary | ICD-10-CM | POA: Diagnosis not present

## 2020-08-17 DIAGNOSIS — E1151 Type 2 diabetes mellitus with diabetic peripheral angiopathy without gangrene: Secondary | ICD-10-CM | POA: Diagnosis not present

## 2020-08-22 ENCOUNTER — Telehealth: Payer: Self-pay | Admitting: Family Medicine

## 2020-08-22 NOTE — Telephone Encounter (Signed)
Left message for patient to call back and schedule Medicare Annual Wellness Visit (AWV) either virtually OR in office.   Last AWV 07/27/19; please schedule at anytime with [facility]-Nurse Health Advisor at [facility] [place].  This should be a 45 minute visit.

## 2020-08-27 ENCOUNTER — Other Ambulatory Visit: Payer: Self-pay

## 2020-08-27 DIAGNOSIS — I739 Peripheral vascular disease, unspecified: Secondary | ICD-10-CM

## 2020-09-18 ENCOUNTER — Other Ambulatory Visit: Payer: Self-pay | Admitting: Family Medicine

## 2020-10-17 ENCOUNTER — Telehealth: Payer: Self-pay

## 2020-10-17 NOTE — Telephone Encounter (Signed)
Pecola Leisure is calling in from [facility], asking if we have received the request for diabetic supplies.

## 2020-10-17 NOTE — Telephone Encounter (Signed)
Yes, [facility] is working on this.

## 2020-10-17 NOTE — Telephone Encounter (Signed)
Called to speak with the patient to make sure he needs these supplies because I don't see any of these supplies on his med lisit he didn't answer. [place]. Also called and spoke with this medical supply pharmacy to update them on what was going on they gave a verbal understanding.

## 2020-10-20 DIAGNOSIS — Z23 Encounter for immunization: Secondary | ICD-10-CM | POA: Diagnosis not present

## 2020-11-02 NOTE — Progress Notes (Signed)
Phone [phone]    Subjective:  Patient presents today for their annual wellness visit (subsequent)  Preventive Screening-Counseling & Management  Modifiable Risk Factors/behavioral risk assessment/psychosocial risk assessment Regular exercise:  Walking 3 days a week for 30-40 mins- encouraged to add a day a week Diet: tries to eat reasonably healthy but weight trending up some- needs to eat 3 meals a day to avoid lows.   Wt Readings from Last 3 Encounters:  11/05/20 193 lb (87.5 kg)  08/16/20 194 lb 12.8 oz (88.4 kg)  05/30/20 187 lb (84.8 kg)  Smoking Status: former Smoker- quit over 30 years ago. Get UA with [place] [facility] Second Hand Smoking status: No smokers in home Alcohol intake: no alcohol in 14 months Other substance abuse/illicit drugs: none  Cardiac risk factors:  advanced age (older than 64 for men, 30 for women)  Known PAD and CAD treated Hyperlipidemia  Treated Hypertension  Slight poor control diabetes but want to avoid lows Lab Results  Component Value Date   HGBA1C 8.1 (H) 02/24/2020  Family History: states father had enlarged heart, never had blockage/stents.    Depression Screen/risk evaluation Risk factors: none.Marland Kitchen PHQ2 0  Depression screen [facility] 2/9 11/05/2020 11/04/2019 03/24/2019 07/14/2018 07/02/2016  Decreased Interest 0 0 0 0 0  Down, Depressed, Hopeless 0 0 0 0 0  PHQ - 2 Score 0 0 0 0 0  Altered sleeping 0 - - - -  Tired, decreased energy 0 - - - -  Change in appetite 0 - - - -  Feeling bad or failure about yourself  0 - - - -  Trouble concentrating 0 - - - -  Moving slowly or fidgety/restless 0 - - - -  Suicidal thoughts 0 - - - -  PHQ-9 Score 0 - - - -  Difficult doing work/chores Not difficult at all - - - -  Some recent data might be hidden    Functional ability and level of safety Mobility assessment:  timed get up and go <12 seconds Activities of Daily Living- Independent in ADLs (toileting, bathing, dressing, transferring, eating) and  in IADLs (shopping, housekeeping, managing own medications, and handling finances) Home Safety: Loose rugs (no), smoke detectors (up to date), small pets (no), grab bars (installed), stairs (single level), life-alert system (would use cell phone) Hearing Difficulties: -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 Also thinks cataract surgery could help him.  Fall Risk  11/05/2020 05/25/2019 05/14/2018 01/28/2018 07/02/2016  Falls in the past year? 1 0 0 No No  Comment - Emmi Telephone Survey: data to providers prior to load Emmi Telephone Survey: data to providers prior to load Emmi Telephone Survey: data to providers prior to load -  Number falls in past yr: 1 - - - -  Injury with Fall? 0 - - - -  Opioid use history:  no current or long term opioids use Self assessment of health status: "fair to good"  Cognitive Testing             No reported trouble.   Mini cog: abnormal  clock draw (drew 10 55). 3/3 delayed recall. 3/6 score- likely normal and no reported issues- will monitor  List the Names of Other Physician/Practitioners you currently use: Patient Care Team: Marin Olp, MD as PCP - General (Family Medicine) Sherren Mocha, MD as PCP - Cardiology (Cardiology) -  Dr. Carolin Sicks nephrology, Dr. Gladstone Lighter ortho, Dr. Katy Fitch optho, Dr. Fuller Plan GI, Dr. Barrie Dunker dentistry  Required Immunizations needed today:  Will contact us with dates of covid vaccines Immunization History  Administered Date(s) Administered  . Influenza Split 03/30/2012  . Influenza Whole 05/03/2009, 03/30/2010, 03/25/2013  . Influenza, High Dose Seasonal PF 02/13/2015, 04/07/2018, 03/17/2019  . Influenza,inj,Quad PF,6+ Mos 04/12/2014  . Influenza-Unspecified 02/29/2016, 03/02/2017, 03/17/2019  . PFIZER(Purple Top)SARS-COV-2 Vaccination 08/05/2019, 08/30/2019  . Pneumococcal Conjugate-13 10/04/2014   . Pneumococcal Polysaccharide-23 03/03/2008  . Td 03/03/2008  . Zoster 08/13/2010  . Zoster Recombinat (Shingrix) 01/23/2018, 04/07/2018   Health Maintenance  Topic Date Due  . Hepatitis C Screening: USPSTF Recommendation to screen - Ages 37-79 yo.  Never done  . DEXA scan (bone density measurement)  Never done  . COVID-19 Vaccine (3 - Booster for Pfizer series) 03/01/2020  . Hemoglobin A1C  08/26/2020  . Flu Shot  01/28/2021  . Eye exam for diabetics  06/04/2021  . Complete foot exam   11/05/2021  . Pneumonia vaccines  Completed  . HPV Vaccine  Aged Out  . Tetanus Vaccine  Discontinued    Screening tests-  1. Colon cancer screening- 12/2015 with posisble 5 year repeat this year per recall 2. Lung Cancer screening- does not qualify- quit 70 years ago 3. Skin cancer screening- no dermatologist. Lower risk due to melanin content. Denies worrisome skin lesions 4. Prostate cancer screening- past age based screening recs Lab Results  Component Value Date   PSA 1.06 06/05/2014   PSA done 07/01/2003   The following were reviewed and entered/updated in epic if appropriate: Past Medical History:  Diagnosis Date  . CLAUDICATION 05/14/2007  . CORONARY ARTERY DISEASE 12/28/2006   Myoview 8/21: EF 73, normal perfusion, low risk   . Diabetes mellitus type II, controlled ([place]) 12/28/2006   Actos 30mg , insulin 70/30 20 units BID, amaryl 1mg  previously a1c <8. Worsened due to cold over 5 weeks around 05/2014 eating poorly and eating sugary syrups.   Stomach irritation on metformin.  Lab Results  Component Value Date   HGBA1C 8.7* 06/05/2014       . DIABETES MELLITUS, TYPE II 12/28/2006  . Duodenal ulcer   . Erosive gastritis   . GERD (gastroesophageal reflux disease)   . GOUT 12/28/2006  . Hemorrhoids   . Hiatal hernia   . HYPERLIPIDEMIA 12/28/2006  . HYPERTENSION 12/28/2006  . LATERAL EPICONDYLITIS, RIGHT 12/11/2009  . Myocardial infarction ([place])    23 yrs ago per pt  . Raynaud's disease    . RENAL FAILURE, CHRONIC 09/15/2008  . Tubular adenoma of colon 11/2010   Patient Active Problem List   Diagnosis Date Noted  . 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 PAD (peripheral artery disease) (HCC)-with claudication 05/14/2007    Priority: High  . Diabetes mellitus with renal complications ([place]) [id number]    Priority: High  .  Coronary artery disease s/p CABG 1997 12/28/2006    Priority: High  . Cervical arthritis ([place]) 10/25/2014    Priority: Medium  . Erectile dysfunction 08/13/2010    Priority: Medium  . CKD (chronic kidney disease), stage III ([place]) 09/15/2008    Priority: Medium  . Hyperlipidemia associated with type 2 diabetes mellitus ([place]) 12/28/2006    Priority: Medium  . 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 Hypertension associated with diabetes ([place]) 12/28/2006    Priority: Medium  . Allergic rhinitis 11/18/2016    Priority:  Low  . Pulmonary nodule seen on imaging study 05/17/2012    Priority: Low   Past Surgical History:  Procedure Laterality Date  . ABDOMINAL AORTOGRAM W/LOWER EXTREMITY N/A 02/10/2020   Procedure: ABDOMINAL AORTOGRAM W/LOWER EXTREMITY;  Surgeon: Elam Dutch, MD;  Location: [facility];  Service: Cardiovascular;  Laterality: N/A;  . CATARACT EXTRACTION Left 06/21/2020  . COLONOSCOPY    . CORONARY ARTERY BYPASS GRAFT    . ENDARTERECTOMY FEMORAL Left 02/27/2020   Procedure: LEFT COMMON FEMORAL ENDARTERECTOMY;  Surgeon: Elam Dutch, MD;  Location: [facility];  Service: Vascular;  Laterality: Left;  . FEMORAL-POPLITEAL BYPASS GRAFT Left 02/27/2020   Procedure: LEFT FEMORAL-ABOVE KNEE POPLITEAL BYPASS WITH NON REVERSED GREATER SAPHENOUS VEIN;  Surgeon: Elam Dutch, MD;  Location: [facility];  Service: Vascular;  Laterality: Left;  . FEMORAL-POPLITEAL BYPASS GRAFT Right 04/23/2020   Procedure: BYPASS GRAFT FEMORAL- Above Knee POPLITEAL ARTERY RIGHT Using Right Greater Saphenous Vein;  Surgeon:  Elam Dutch, MD;  Location: [facility];  Service: Vascular;  Laterality: Right;  . KNEE ARTHROSCOPY  2012  . UPPER GASTROINTESTINAL ENDOSCOPY      Family History  Problem Relation Age of Onset  . Diabetes Mother   . Hypertension Mother   . Stroke Father   . Colon cancer Neg Hx   . Esophageal cancer Neg Hx   . Rectal cancer Neg Hx   . Stomach cancer Neg Hx     Medications- reviewed and updated Current Outpatient Medications  Medication Sig Dispense Refill  . aspirin EC 81 MG tablet Take 81 mg by mouth daily. Swallow whole.    Marland Kitchen atorvastatin (LIPITOR) 40 MG tablet Take 1 tablet by mouth once daily 90 tablet 0  . azelastine (ASTELIN) 0.1 % nasal spray Place 1 spray into both nostrils daily as needed for rhinitis. Use in each nostril as directed    . chlorthalidone (HYGROTON) 25 MG tablet Take 1 tablet by mouth once daily 90 tablet 0  . cholecalciferol (VITAMIN D3) 25 MCG (1000 UNIT) tablet Take 1,000 Units by mouth daily.    . colchicine 0.6 MG tablet Take 0.6 mg by mouth daily as needed (for gout).    . Continuous Blood Gluc Receiver (FREESTYLE LIBRE 14 DAY READER) DEVI 2 each by Does not apply route daily. 2 each 11  . Continuous Blood Gluc Sensor (FREESTYLE LIBRE 14 DAY SENSOR) MISC 2 each by Does not apply route daily. 2 each 11  . cyclobenzaprine (FLEXERIL) 5 MG tablet TAKE ONE TABLET BY MOUTH TWICE DAILY AS NEEDED FOR MUSCLE SPASM (Patient taking differently: Take 5 mg by mouth 2 (two) times daily as needed for muscle spasms.) 30 tablet 0  . fexofenadine (ALLEGRA) 180 MG tablet Take 180 mg by mouth daily.    . fluticasone (FLONASE) 50 MCG/ACT nasal spray USE 2 SPRAY(S) IN EACH NOSTRIL ONCE DAILY AS NEEDED (FOR  ALLERGIES  OR  RHINITIS) 16 g 0  . metoprolol succinate (TOPROL-XL) 100 MG 24 hr tablet TAKE 1 TABLET EVERY DAY WITH OR IMMMEDIATELY FOLLOWING A MEAL 90 tablet 1  . NIFEdipine (PROCARDIA-XL) 30 MG (OSM) 24 hr tablet Take 30 mg by mouth 2 (two) times daily.    Marland Kitchen NOVOLIN  70/30 RELION (70-30) 100 UNIT/ML injection INJECT 22 UNITS INTO THE SKIN IN THE MORNING AND 20 UNITS IN THE EVENING (Patient taking differently: Inject 20-22 Units into the skin See admin instructions. Inject 22 units subcutaneously in the morning & inject 20 units subcutaneously  in the evening) 10 mL 0  . oxymetazoline (AFRIN) 0.05 % nasal spray Place 1 spray into both nostrils 2 (two) times daily as needed for congestion.    . pioglitazone (ACTOS) 30 MG tablet Take 1 tablet by mouth once daily 90 tablet 0  . probenecid (BENEMID) 500 MG tablet Take 1 tablet (500 mg total) by mouth 2 (two) times daily. For first month take half tablet twice daily then full tablet twice daily ongoing 60 tablet 5   No current facility-administered medications for this visit.    Allergies-reviewed and updated Allergies  Allergen Reactions  . Penicillins Hives  . Tetracycline Hcl Hives  . Allopurinol Itching    Social History   Socioeconomic History  . Marital status: Married    Spouse name: Not on file  . Number of children: 2  . Years of education: Not on file  . Highest education level: Not on file  Occupational History  . Occupation: retired  Tobacco Use  . Smoking status: Former Smoker    Types: Cigarettes    Quit date: 07/09/1968    Years since quitting: 52.3  . Smokeless tobacco: Former Systems developer    Types: Chew    Quit date: 07/09/1988  . Tobacco comment: minimal use x 10 years   Vaping Use  . Vaping Use: Never used  Substance and Sexual Activity  . Alcohol use: No    Comment: last was 6 months  . Drug use: No  . Sexual activity: Not on file  Other Topics Concern  . Not on file  Social History Narrative   Married 1983. 2 sons. No grandkids yet.       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       Hobbies: volunteer work, watch sports   Social Determinants of Radio broadcast assistant Strain: denies  Food Insecurity:  denies  Transportation Needs: denies  Physical Activity: walking regularly  Stress:  Not particularly other than low blood sugars which we are woring   Social Connections: good connections with friend and family other than due to covid      Objective:  BP (!) 134/54   Pulse 64   Temp 98.2 F (36.8 C) (Temporal)   Ht 6' (1.829 m)   Wt 193 lb (87.5 kg)   BMI 26.18 kg/m  Gen: NAD, resting comfortably   Assessment/Plan:  AWV completed 1. Educated, counseled and referred based on above elements 2. Educated, counseled and referred as appropriate for preventative needs 3. Discussed and documented a written plan for preventiative services and screenings with personalized health advice- After Visit Summary was given to patient which included this plan   Status of chronic or acute concerns  See separate note today for problem oriented concerns  Recommended follow up: Return in about 4 months (around 03/08/2021) for follow up- or sooner if needed.  Lab/Order associations:NOT fasting   ICD-10-CM   1. Preventative health care  Z00.00    Return precautions advised.  Garret Reddish, MD

## 2020-11-02 NOTE — Patient Instructions (Addendum)
Please stop by lab before you go If you have mychart- we will send your results within 3 business days of [place] receiving them.  If you do not have mychart- we will call you about results within 5 business days of [place] receiving them.  *please also note that you will see labs on mychart as soon as they post. I will later go in and write notes on them- will say "notes from Dr. Yong Channel"  - with lows over last month we will stop the glimepiride 1 mg. If any low blood sugars. Under 80 asked him to let me know and I will reduce insulin.  -DO NOT SKIP MEALS -lets see if freestyle libre covered to help with more intense monitoring  Also try probenacid to lower risk of gout attacks long term  Health Maintenance Due  Topic Date Due  . COVID-19 Vaccine (3 - Booster for [employer] series) will call back with dates.  03/01/2020      Matthew [name] , Thank you for taking time to come for your Medicare Wellness Visit. I appreciate your ongoing commitment to your health goals. Please review the following plan we discussed and let me know if I can assist you in the future.   These are the goals we discussed: Goals    . patient     To maintain health; dietary control  Keep up the exercise- goal 150 minutes a week      This is a list of the screening recommended for you and due dates:  Health Maintenance  Topic Date Due  . Hepatitis C Screening: USPSTF Recommendation to screen - Ages 50-79 yo.  Never done  . DEXA scan (bone density measurement)  Never done  . COVID-19 Vaccine (3 - Booster for Pfizer series) 03/01/2020  . Complete foot exam   07/26/2020  . Hemoglobin A1C  08/26/2020  . Flu Shot  01/28/2021  . Eye exam for diabetics  06/04/2021  . Pneumonia vaccines  Completed  . HPV Vaccine  Aged Out  . Tetanus Vaccine  Discontinued

## 2020-11-05 ENCOUNTER — Other Ambulatory Visit: Payer: Self-pay

## 2020-11-05 ENCOUNTER — Encounter: Payer: Self-pay | Admitting: Family Medicine

## 2020-11-05 ENCOUNTER — Ambulatory Visit (INDEPENDENT_AMBULATORY_CARE_PROVIDER_SITE_OTHER): Payer: Medicare Other | Admitting: Family Medicine

## 2020-11-05 VITALS — BP 134/54 | HR 64 | Temp 98.2°F | Ht 72.0 in | Wt 193.0 lb

## 2020-11-05 DIAGNOSIS — E1159 Type 2 diabetes mellitus with other circulatory complications: Secondary | ICD-10-CM

## 2020-11-05 DIAGNOSIS — I2510 Atherosclerotic heart disease of native coronary artery without angina pectoris: Secondary | ICD-10-CM

## 2020-11-05 DIAGNOSIS — E1169 Type 2 diabetes mellitus with other specified complication: Secondary | ICD-10-CM

## 2020-11-05 DIAGNOSIS — E1122 Type 2 diabetes mellitus with diabetic chronic kidney disease: Secondary | ICD-10-CM

## 2020-11-05 DIAGNOSIS — N183 Chronic kidney disease, stage 3 unspecified: Secondary | ICD-10-CM | POA: Diagnosis not present

## 2020-11-05 DIAGNOSIS — E785 Hyperlipidemia, unspecified: Secondary | ICD-10-CM | POA: Diagnosis not present

## 2020-11-05 DIAGNOSIS — M1A9XX1 Chronic gout, unspecified, with tophus (tophi): Secondary | ICD-10-CM | POA: Diagnosis not present

## 2020-11-05 DIAGNOSIS — Z794 Long term (current) use of insulin: Secondary | ICD-10-CM

## 2020-11-05 DIAGNOSIS — Z1159 Encounter for screening for other viral diseases: Secondary | ICD-10-CM

## 2020-11-05 DIAGNOSIS — I152 Hypertension secondary to endocrine disorders: Secondary | ICD-10-CM | POA: Diagnosis not present

## 2020-11-05 DIAGNOSIS — Z0000 Encounter for general adult medical examination without abnormal findings: Secondary | ICD-10-CM

## 2020-11-05 LAB — COMPREHENSIVE METABOLIC PANEL
ALT: 11 U/L (ref 0–53)
AST: 17 U/L (ref 0–37)
Albumin: 4.1 g/dL (ref 3.5–5.2)
Alkaline Phosphatase: 57 U/L (ref 39–117)
BUN: 34 mg/dL — ABNORMAL HIGH (ref 6–23)
CO2: 29 mEq/L (ref 19–32)
Calcium: 9.4 mg/dL (ref 8.4–10.5)
Chloride: 106 mEq/L (ref 96–112)
Creatinine, Ser: 1.67 mg/dL — ABNORMAL HIGH (ref 0.40–1.50)
GFR: 39.05 mL/min — ABNORMAL LOW (ref >60.00)
Glucose, Bld: 90 mg/dL (ref 70–99)
Potassium: 4.6 mEq/L (ref 3.5–5.1)
Sodium: 140 mEq/L (ref 135–145)
Total Bilirubin: 0.5 mg/dL (ref 0.2–1.2)
Total Protein: 7.9 g/dL (ref 6.0–8.3)

## 2020-11-05 LAB — CBC WITH DIFFERENTIAL/PLATELET
Basophils Absolute: 0 10*3/uL (ref 0.0–0.1)
Basophils Relative: 0.5 % (ref 0.0–3.0)
Eosinophils Absolute: 0.3 10*3/uL (ref 0.0–0.7)
Eosinophils Relative: 5.4 % — ABNORMAL HIGH (ref 0.0–5.0)
HCT: 41.3 % (ref 39.0–52.0)
Hemoglobin: 13.6 g/dL (ref 13.0–17.0)
Lymphocytes Relative: 18.1 % (ref 12.0–46.0)
Lymphs Abs: 0.9 10*3/uL (ref 0.7–4.0)
MCHC: 33 g/dL (ref 30.0–36.0)
MCV: 87 fl (ref 78.0–100.0)
Monocytes Absolute: 0.3 10*3/uL (ref 0.1–1.0)
Monocytes Relative: 7.3 % (ref 3.0–12.0)
Neutro Abs: 3.2 10*3/uL (ref 1.4–7.7)
Neutrophils Relative %: 68.7 % (ref 43.0–77.0)
Platelets: 178 10*3/uL (ref 150.0–400.0)
RBC: 4.75 Mil/uL (ref 4.22–5.81)
RDW: 13.7 % (ref 11.5–15.5)
WBC: 4.7 10*3/uL (ref 4.0–10.5)

## 2020-11-05 LAB — HEMOGLOBIN A1C: Hgb A1c MFr Bld: 6.4 % (ref 4.6–6.5)

## 2020-11-05 LAB — URIC ACID: Uric Acid, Serum: 7.9 mg/dL — ABNORMAL HIGH (ref 4.0–7.8)

## 2020-11-05 MED ORDER — PROBENECID 500 MG PO TABS: 500.0000 mg | ORAL_TABLET | Freq: Two times a day (BID) | ORAL | 5 refills | Status: DC

## 2020-11-05 MED ORDER — FREESTYLE LIBRE 14 DAY READER DEVI: 2.0000 | Freq: Every day | 11 refills | Status: DC

## 2020-11-05 MED ORDER — FREESTYLE LIBRE 14 DAY SENSOR MISC: 2.0000 | Freq: Every day | 11 refills | Status: DC

## 2020-11-05 NOTE — Progress Notes (Signed)
Phone [phone] In person visit   Subjective:   Matthew [name] is a 80 y.o. year old very pleasant adult patient who presents for/with See problem oriented charting This visit occurred during the SARS-CoV-2 public health emergency.  Safety protocols were in place, including screening questions prior to the visit, additional usage of staff PPE, and extensive cleaning of exam room while observing appropriate contact time as indicated for disinfecting solutions.   Past Medical History-  Patient Active Problem List   Diagnosis Date Noted  . PAD (peripheral artery disease) (HCC)-with claudication 05/14/2007    Priority: High  . Diabetes mellitus with renal complications ([place]) 17/79/3903    Priority: High  .  Coronary artery disease s/p CABG 1997 12/28/2006    Priority: High  . Cervical arthritis ([place]) 10/25/2014    Priority: Medium  . Erectile dysfunction 08/13/2010    Priority: Medium  . CKD (chronic kidney disease), stage III ([place]) 09/15/2008    Priority: Medium  . Hyperlipidemia associated with type 2 diabetes mellitus ([place]) 12/28/2006    Priority: Medium  . 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 Hypertension associated with diabetes ([place]) 12/28/2006    Priority: Medium  . Allergic rhinitis 11/18/2016    Priority: Low  . Pulmonary nodule seen on imaging study 05/17/2012    Priority: Low  . 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 aspirin EC 81 MG tablet Take 81 mg by mouth daily. Swallow whole.    Marland Kitchen atorvastatin (LIPITOR) 40 MG tablet Take 1 tablet by mouth once daily 90 tablet 0  . azelastine (ASTELIN) 0.1 % nasal spray Place 1 spray into both nostrils daily as needed for rhinitis. Use in each nostril as directed    . chlorthalidone (HYGROTON) 25 MG tablet Take 1 tablet by mouth once daily 90 tablet 0  . cholecalciferol (VITAMIN D3) 25 MCG (1000 UNIT)  tablet Take 1,000 Units by mouth daily.    . colchicine 0.6 MG tablet Take 0.6 mg by mouth daily as needed (for gout).    . cyclobenzaprine (FLEXERIL) 5 MG tablet TAKE ONE TABLET BY MOUTH TWICE DAILY AS NEEDED FOR MUSCLE SPASM (Patient taking differently: Take 5 mg by mouth 2 (two) times daily as needed for muscle spasms.) 30 tablet 0  . fexofenadine (ALLEGRA) 180 MG tablet Take 180 mg by mouth daily.    . fluticasone (FLONASE) 50 MCG/ACT nasal spray USE 2 SPRAY(S) IN EACH NOSTRIL ONCE DAILY AS NEEDED (FOR  ALLERGIES  OR  RHINITIS) 16 g 0  . glimepiride (AMARYL) 1 MG tablet TAKE 1 TABLET BY MOUTH ONCE DAILY BEFORE BREAKFAST 90 tablet 0  . metoprolol succinate (TOPROL-XL) 100 MG 24 hr tablet TAKE 1 TABLET EVERY DAY WITH OR IMMMEDIATELY FOLLOWING A MEAL 90 tablet 1  . NIFEdipine (PROCARDIA-XL) 30 MG (OSM) 24 hr tablet Take 30 mg by mouth 2 (two) times daily.    Marland Kitchen NOVOLIN 70/30 RELION (70-30) 100 UNIT/ML injection INJECT 22 UNITS INTO THE SKIN IN THE MORNING AND 20 UNITS IN THE EVENING (Patient taking differently: Inject 20-22 Units into the skin See admin instructions. Inject 22 units subcutaneously in the morning & inject 20 units subcutaneously in the evening) 10 mL 0  . oxymetazoline (AFRIN) 0.05 % nasal spray Place 1 spray into both nostrils 2 (two) times daily as needed for congestion.    . pioglitazone (ACTOS) 30 MG tablet Take  1 tablet by mouth once daily 90 tablet 0   No current facility-administered medications for this visit.     Objective:  BP (!) 134/54   Pulse 64   Temp 98.2 F (36.8 C) (Temporal)   Ht 6' (1.829 m)   Wt 193 lb (87.5 kg)   BMI 26.18 kg/m  Gen: NAD, resting comfortably TM normal, PERRLA, no cervical lymphadenopathy CV: RRR no murmurs rubs or gallops Lungs: CTAB no crackles, wheeze, rhonchi Abdomen: soft/nontender/nondistended/normal bowel sounds. No rebound or guarding.  Ext: trace edema Skin: warm, dry  Diabetic Foot Exam - Simple   Simple Foot  Form Diabetic Foot exam was performed with the following findings: Yes 11/05/2020 11:08 AM  Visual Inspection No deformities, no ulcerations, no other skin breakdown bilaterally: Yes Sensation Testing Intact to touch and monofilament testing bilaterally: Yes Pulse Check Posterior Tibialis and Dorsalis pulse intact bilaterally: Yes Comments       Assessment and Plan   #social update- wife with multiple myeloma diagnosed 2021  #PAD #CAD- no chest pain or shortness of breath #hyperlipidemia S: Medication: Atorvastatin 40Mg daily. Aspirin 424m Lab Results  Component Value Date   CHOL 98 04/24/2020   HDL 33 (L) 04/24/2020   LDLCALC 53 04/24/2020   LDLDIRECT 68.0 07/27/2019   TRIG 60 04/24/2020   CHOLHDL 3.0 04/24/2020   A/P:  Lipids well controlled on check in October- continue current medicine with LDL below 70 - also controleld with recent study labs  For PAD- no reported claudication (does have some rest pain) after left femoral to above knee popliteal bypass Feb 27 2020 and then with right bypass graft femoral above knee popliteal artery using right greater saphenous vein with Dr. FOneida Alar contnue aspirin and statin.   For CAD - asymptomatic- continue current meds.   # Diabetes S: Medication: Actos 30Mg daily, novolin 70/30  22 units in AM and 20 units in PM, Glimipride 135mdaily, CBGs- 2-3x per month getting down to 60 or so  (midday is most common along with late evening)- has had falls associated with this.. has not hit his head with this.  - 80s in 90s in mornings, 123 highest when missed insulin one night.  Lab Results  Component Value Date   HGBA1C 8.1 (H) 02/24/2020   HGBA1C 7.1 (A) 11/04/2019   HGBA1C 8.7 (H) 07/27/2019   A/P: diabetes sounds overcontrolled. Continue novolin 70/30 22 in AM and 20 in pm and actos for now but may reduce these as well - with lows over last month we will stop the glimepiride 1 mg. If any low blood sugars. Under 80 asked him to let me  know.  -if any more lows (even under 80 he is to let usKoreanow) and will reduce insulin- he does tell me this occurs if skips meals or pushes back- discussed on insulin he cannot skip meals  #Gout S: 2 flares in a year on Cholchicine 0.24m38maily prn. Itching on allopurinol now with PAD hold off on uloric Lab Results  Component Value Date   LABURIC 8.2 (H) 11/24/2019   A/P:poor control but doesn't tolerate allopurinol . Try probenacid250m824mD for a month then 500mg19m May be less effective at his GFR -aspirin may decrease efficacy of probenecid but still think we should trial  #hypertension S: medication: Chlorthalidone 25Mg daily, metoprolol 100mg 79my XL - was taken off lisinopril by Dr. FieldsOneida Alarear why)b BP Readings from Last 3 Encounters:  08/16/20 133/64  05/30/20 130/62  05/10/20  133/71  A/P: blood pressure controlled even with stopping lisinopril- continue current meds  #Chronic kidney disease stage III S: GFR is typically in the 40s range -Patient knows to avoid NSAIDs  A/P: stable on last check with [facility] -update again today with labs - ctually cr was better with them at 1.58.     Recommended follow up: Return in about 6 months (around 05/08/2021) for follow up- or sooner if needed. or sooner if a1c above 8.5    Lab/Order associations:    ICD-10-CM   2. Hypertension associated with diabetes ([place])  E11.59    I15.2   3. Hyperlipidemia associated with type 2 diabetes mellitus (HCC)  E11.69 CBC with Differential/Platelet   E78.5 Comprehensive metabolic panel  4. Type 2 diabetes mellitus with stage 3 chronic kidney disease, with long-term current use of insulin, unspecified whether stage 3a or 3b CKD (HCC)  E11.22 Hemoglobin A1c   N18.30    Z79.4   5. Stage 3 chronic kidney disease, unspecified whether stage 3a or 3b CKD (HCC)  N18.30   6. Gout with tophi  [id number] Uric acid  7. Encounter for hepatitis C screening test for low risk patient  Z11.59 Hepatitis C  antibody  8. Coronary artery disease involving native coronary artery of native heart without angina pectoris  I25.10     Return precautions advised.  Garret Reddish, MD

## 2020-11-06 ENCOUNTER — Encounter (INDEPENDENT_AMBULATORY_CARE_PROVIDER_SITE_OTHER): Payer: Self-pay

## 2020-11-06 DIAGNOSIS — H43823 Vitreomacular adhesion, bilateral: Secondary | ICD-10-CM | POA: Diagnosis not present

## 2020-11-06 DIAGNOSIS — Z794 Long term (current) use of insulin: Secondary | ICD-10-CM | POA: Diagnosis not present

## 2020-11-06 DIAGNOSIS — E119 Type 2 diabetes mellitus without complications: Secondary | ICD-10-CM | POA: Diagnosis not present

## 2020-11-06 DIAGNOSIS — Z961 Presence of intraocular lens: Secondary | ICD-10-CM | POA: Diagnosis not present

## 2020-11-06 DIAGNOSIS — H25011 Cortical age-related cataract, right eye: Secondary | ICD-10-CM | POA: Diagnosis not present

## 2020-11-06 LAB — HEPATITIS C ANTIBODY
Hepatitis C Ab: NONREACTIVE
SIGNAL TO CUT-OFF: 0.11 (ref <1.00)

## 2020-11-08 ENCOUNTER — Encounter: Payer: Self-pay | Admitting: Family Medicine

## 2020-11-20 ENCOUNTER — Encounter (INDEPENDENT_AMBULATORY_CARE_PROVIDER_SITE_OTHER): Payer: Self-pay | Admitting: Ophthalmology

## 2020-11-20 ENCOUNTER — Other Ambulatory Visit: Payer: Self-pay

## 2020-11-20 ENCOUNTER — Ambulatory Visit (INDEPENDENT_AMBULATORY_CARE_PROVIDER_SITE_OTHER): Payer: Medicare Other | Admitting: Ophthalmology

## 2020-11-20 DIAGNOSIS — H2511 Age-related nuclear cataract, right eye: Secondary | ICD-10-CM

## 2020-11-20 DIAGNOSIS — H43821 Vitreomacular adhesion, right eye: Secondary | ICD-10-CM

## 2020-11-20 DIAGNOSIS — I2510 Atherosclerotic heart disease of native coronary artery without angina pectoris: Secondary | ICD-10-CM | POA: Diagnosis not present

## 2020-11-20 DIAGNOSIS — H43822 Vitreomacular adhesion, left eye: Secondary | ICD-10-CM

## 2020-11-20 HISTORY — DX: Age-related nuclear cataract, right eye: H25.11

## 2020-11-20 HISTORY — DX: Vitreomacular adhesion, left eye: H43.822

## 2020-11-20 HISTORY — DX: Vitreomacular adhesion, right eye: H43.821

## 2020-11-20 NOTE — Progress Notes (Signed)
11/20/2020     CHIEF COMPLAINT Patient presents for Blurred Vision (NP VMT OU with mac edema OS - Ref'd by Dr. Gerald Stabs Groat//Pt c/o blurry VA OS x 5 months approx. Pt sts VA OS never improved following cataract surgery. Pt c/o blur at distance and near OS. VA OD stable. Pt sts, "it feels there is a film" over OS. Pt denies ocular pain OU. )   HISTORY OF PRESENT ILLNESS: Matthew [name] is a 80 y.o. adult who presents to the clinic today for:   HPI    Blurred Vision    Laterality: left eye   Onset: gradual   Quality: blurred   Severity: moderate   Onset: 5 months ago   Frequency: constantly   Context: distance vision, mid-range vision and near vision   Course: stable   Treatments tried: eye drops   Response to treatment: no improvement   Comments: NP VMT OU with mac edema OS - Ref'd by Dr. Midge Aver  Pt c/o blurry VA OS x 5 months approx. Pt sts VA OS never improved following cataract surgery. Pt c/o blur at distance and near OS. VA OD stable. Pt sts, "it feels there is a film" over OS. Pt denies ocular pain OU.        Last edited by Rockie Neighbours, [place] on 11/20/2020  3:32 PM. (History)      Referring physician: Marin Olp, [place] [place],  [place] [address]  HISTORICAL INFORMATION:   Selected notes from the MEDICAL RECORD NUMBER    Lab Results  Component Value Date   HGBA1C 6.4 11/05/2020     CURRENT MEDICATIONS: Current Outpatient Medications (Ophthalmic Drugs)  Medication Sig  . ketorolac (ACULAR) 0.4 % SOLN Place 1 drop into the left eye 4 (four) times daily.   No current facility-administered medications for this visit. (Ophthalmic Drugs)   Current Outpatient Medications (Other)  Medication Sig  . aspirin EC 81 MG tablet Take 81 mg by mouth daily. Swallow whole.  Marland Kitchen atorvastatin (LIPITOR) 40 MG tablet Take 1 tablet by mouth once daily  . azelastine (ASTELIN) 0.1 % nasal spray Place 1 spray into both nostrils daily as needed for  rhinitis. Use in each nostril as directed  . chlorthalidone (HYGROTON) 25 MG tablet Take 1 tablet by mouth once daily  . cholecalciferol (VITAMIN D3) 25 MCG (1000 UNIT) tablet Take 1,000 Units by mouth daily.  . colchicine 0.6 MG tablet Take 0.6 mg by mouth daily as needed (for gout).  . Continuous Blood Gluc Receiver (FREESTYLE LIBRE 14 DAY READER) DEVI 2 each by Does not apply route daily.  . Continuous Blood Gluc Sensor (FREESTYLE LIBRE 14 DAY SENSOR) MISC 2 each by Does not apply route daily.  . cyclobenzaprine (FLEXERIL) 5 MG tablet TAKE ONE TABLET BY MOUTH TWICE DAILY AS NEEDED FOR MUSCLE SPASM (Patient taking differently: Take 5 mg by mouth 2 (two) times daily as needed for muscle spasms.)  . fexofenadine (ALLEGRA) 180 MG tablet Take 180 mg by mouth daily.  . fluticasone (FLONASE) 50 MCG/ACT nasal spray USE 2 SPRAY(S) IN EACH NOSTRIL ONCE DAILY AS NEEDED (FOR  ALLERGIES  OR  RHINITIS)  . metoprolol succinate (TOPROL-XL) 100 MG 24 hr tablet TAKE 1 TABLET EVERY DAY WITH OR IMMMEDIATELY FOLLOWING A MEAL  . NIFEdipine (PROCARDIA-XL) 30 MG (OSM) 24 hr tablet Take 30 mg by mouth 2 (two) times daily.  Marland Kitchen NOVOLIN 70/30 RELION (70-30) 100 UNIT/ML injection INJECT 22 UNITS INTO  THE SKIN IN THE MORNING AND 20 UNITS IN THE EVENING (Patient taking differently: Inject 20-22 Units into the skin See admin instructions. Inject 22 units subcutaneously in the morning & inject 20 units subcutaneously in the evening)  . oxymetazoline (AFRIN) 0.05 % nasal spray Place 1 spray into both nostrils 2 (two) times daily as needed for congestion.  . pioglitazone (ACTOS) 30 MG tablet Take 1 tablet by mouth once daily  . probenecid (BENEMID) 500 MG tablet Take 1 tablet (500 mg total) by mouth 2 (two) times daily. For first month take half tablet twice daily then full tablet twice daily ongoing   No current facility-administered medications for this visit. (Other)      REVIEW OF SYSTEMS:    ALLERGIES Allergies   Allergen Reactions  . Penicillins Hives  . Tetracycline Hcl Hives  . Allopurinol Itching    PAST MEDICAL HISTORY Past Medical History:  Diagnosis Date  . CLAUDICATION 05/14/2007  . CORONARY ARTERY DISEASE 12/28/2006   Myoview 8/21: EF 73, normal perfusion, low risk   . Diabetes mellitus type II, controlled ([place]) 12/28/2006   Actos 25m, insulin 70/30 20 units BID, amaryl 162mpreviously a1c <8. Worsened due to cold over 5 weeks around 05/2014 eating poorly and eating sugary syrups.   Stomach irritation on metformin.  Lab Results  Component Value Date   HGBA1C 8.7* 06/05/2014       . DIABETES MELLITUS, TYPE II 12/28/2006  . Duodenal ulcer   . Erosive gastritis   . GERD (gastroesophageal reflux disease)   . GOUT 12/28/2006  . Hemorrhoids   . Hiatal hernia   . HYPERLIPIDEMIA 12/28/2006  . HYPERTENSION 12/28/2006  . LATERAL EPICONDYLITIS, RIGHT 12/11/2009  . Myocardial infarction ([facility]   23 yrs ago per pt  . Raynaud's disease   . RENAL FAILURE, CHRONIC 09/15/2008  . Tubular adenoma of colon 11/2010   Past Surgical History:  Procedure Laterality Date  . ABDOMINAL AORTOGRAM W/LOWER EXTREMITY N/A 02/10/2020   Procedure: ABDOMINAL AORTOGRAM W/LOWER EXTREMITY;  Surgeon: FiElam DutchMD;  [facility];  Service: Cardiovascular;  Laterality: N/A;  . CATARACT EXTRACTION Left 06/21/2020  . COLONOSCOPY    . CORONARY ARTERY BYPASS GRAFT    . ENDARTERECTOMY FEMORAL Left 02/27/2020   Procedure: LEFT COMMON FEMORAL ENDARTERECTOMY;  Surgeon: FiElam DutchMD;  [facility];  Laterality: Left;  . FEMORAL-POPLITEAL BYPASS GRAFT Left 02/27/2020   Procedure: LEFT FEMORAL-ABOVE KNEE POPLITEAL BYPASS WITH NON REVERSED GREATER SAPHENOUS VEIN;  Surgeon: FiElam DutchMD;  [facility];  Laterality: Left;  . FEMORAL-POPLITEAL BYPASS GRAFT Right 04/23/2020   Procedure: BYPASS GRAFT FEMORAL- Above Knee POPLITEAL ARTERY RIGHT Using  Right Greater Saphenous Vein;  Surgeon: FiElam DutchMD;  [facility];  Laterality: Right;  . KNEE ARTHROSCOPY  2012  . UPPER GASTROINTESTINAL ENDOSCOPY      FAMILY HISTORY Family History  Problem Relation Age of Onset  . Diabetes Mother   . Hypertension Mother   . Stroke Father   . Colon cancer Neg Hx   . Esophageal cancer Neg Hx   . Rectal cancer Neg Hx   . Stomach cancer Neg Hx     SOCIAL HISTORY Social History   Tobacco Use  . Smoking status: Former Smoker    Types: Cigarettes    Quit date: 07/09/1968    Years since quitting: 52.4  . Smokeless tobacco: Former [employer] developer  Types: Sarina Ser    Quit date: 07/09/1988  . Tobacco comment: minimal use x 10 years   Vaping Use  . Vaping Use: Never used  Substance Use Topics  . Alcohol use: No    Comment: last was 6 months  . Drug use: No         OPHTHALMIC EXAM: Base Eye Exam    Visual Acuity (ETDRS)      Right Left   Dist [place] 20/80 -3 20/40   Dist ph [place] 20/30 20/30       Tonometry (Tonopen, 3:41 PM)      Right Left   Pressure 12 10       Pupils      Pupils Dark Light Shape React APD   Right PERRL 5 4 Round Brisk None   Left PERRL 5 4 Round Brisk None       Visual Fields (Counting fingers)      Left Right    Full Full       Extraocular Movement      Right Left    Full Full       Neuro/Psych    Oriented x3: Yes   Mood/Affect: Normal       Dilation    Both eyes: 1.0% Mydriacyl, 2.5% Phenylephrine @ 3:41 PM        Slit Lamp and Fundus Exam    External Exam      Right Left   External Normal Normal       Slit Lamp Exam      Right Left   Lids/Lashes Normal Normal   Conjunctiva/Sclera White and quiet White and quiet   Cornea Clear Clear   Anterior Chamber Deep and quiet Deep and quiet   Iris Round and reactive Round and reactive   Lens 3+ Nuclear sclerosis Centered posterior chamber intraocular lens   Anterior Vitreous Normal Normal       Fundus Exam      Right Left    Posterior Vitreous Normal Normal   Disc Normal Normal   C/D Ratio 0.55 0.5   Macula Microaneurysms, Pseudohole with pseudo cystoid change likely a serous elevation noted on OCT Retinal Hemorrhage, Pseudohole appearing is a pseudo cystoid change which corresponds with the serous retinal detachment on OCT and schisis of the inner retina, Microaneurysms   Vessels Normal Normal   Periphery Normal Normal          IMAGING AND PROCEDURES  Imaging and Procedures for 11/20/20  OCT, Retina - OU - Both Eyes       Right Eye Quality was good. Scan locations included subfoveal. Central Foveal Thickness: 319. Progression has no prior data. Findings include abnormal foveal contour, vitreous traction.   Left Eye Quality was good. Scan locations included subfoveal. Central Foveal Thickness: 329. Progression has no prior data. Findings include abnormal foveal contour, vitreous traction.   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 Vitreomacular adhesion of left eye  H43.822 OCT, Retina - OU - Both Eyes  2. Vitreomacular traction syndrome of right eye  H43.821 OCT, Retina - OU - Both Eyes  3. Cataract, nuclear sclerotic, right eye  H25.11     1.  Risk and benefits  of surgical intervention the left eye were reviewed at length as well as demonstrably reviewing the OCT findings in the left on the right eye.  I explained to the patient that the foveal impact of this elevation of the retina as well as the splitting of the retina (schisis) potentially in part a permanent visual impact on acuity if not corrected or attempted correction via simple vitrectomy.  I assured the patient that no membrane peel will be necessary. 2.  Risk and benefits of  Proceeding with surgery will be reviewed and the patient will have time to make his decision at his discretion as to whether to proceed  3.  I have also explained the patient he should also proceed with cataract surgery in the right eye knowing full well that the right eye might similarly progress and require similar contemplation of surgery to release the vitreal macular traction/adhesion.  Nonetheless I have seen many patients who have cataract surgery and then spontaneous release of the VMT occurs not requiring my surgery  However each I have brought attachments of the VMT and already have serious elevations thus this spontaneous improvement is not likely to happen  More over Babs Bertin  is no longer commercially available  Ophthalmic Meds Ordered this visit:  No orders of the defined types were placed in this encounter.      Return SCA, LMAC, for I do recommend schedule vitrectomy left eye to release the vitreal foveal traction, OS.  There are no Patient Instructions on file for this visit.   Explained the diagnoses, plan, and follow up with the patient and they expressed understanding.  Patient expressed understanding of the importance of proper follow up care.   Clent Demark Kaelan Emami M.D. Diseases & Surgery of the Retina and Vitreous [facility] [place] 11/20/20     Abbreviations: M myopia (nearsighted); A astigmatism; H hyperopia (farsighted); P presbyopia; Mrx spectacle prescription;  CTL contact  lenses; OD right eye; OS left eye; OU both eyes  XT exotropia; ET esotropia; PEK punctate epithelial keratitis; PEE punctate epithelial erosions; DES dry eye syndrome; MGD meibomian gland dysfunction; ATs artificial tears; PFAT's preservative free artificial tears; [place] nuclear sclerotic cataract; PSC posterior subcapsular cataract; ERM epi-retinal membrane; PVD posterior vitreous detachment; RD retinal detachment; DM diabetes mellitus; DR diabetic retinopathy; NPDR non-proliferative diabetic retinopathy; PDR proliferative diabetic retinopathy; CSME clinically significant macular edema; DME diabetic macular edema; dbh dot blot hemorrhages; CWS cotton wool spot; POAG primary open angle glaucoma; C/D cup-to-disc ratio; HVF humphrey visual field; GVF goldmann visual field; OCT optical coherence tomography; IOP intraocular pressure; BRVO Branch retinal vein occlusion; CRVO central retinal vein occlusion; CRAO central retinal artery occlusion; BRAO branch retinal artery occlusion; RT retinal tear; SB scleral buckle; PPV pars plana vitrectomy; VH Vitreous hemorrhage; PRP panretinal laser photocoagulation; IVK intravitreal kenalog; VMT vitreomacular traction; MH Macular hole;  NVD neovascularization of the disc; NVE neovascularization elsewhere; AREDS age related eye disease study; ARMD age related macular degeneration; POAG primary open angle glaucoma; EBMD epithelial/anterior basement membrane dystrophy; ACIOL anterior  chamber intraocular lens; IOL intraocular lens; PCIOL posterior chamber intraocular lens; Phaco/IOL phacoemulsification with intraocular lens placement; [place] photorefractive keratectomy; LASIK laser assisted in situ keratomileusis; HTN hypertension; DM diabetes mellitus; COPD chronic obstructive pulmonary disease

## 2020-11-20 NOTE — Assessment & Plan Note (Signed)
Small serous retinal detachment of the fovea secondary to vitreomacular adhesion traction syndrome

## 2020-11-20 NOTE — Assessment & Plan Note (Signed)
Dense cataract, okay to proceed with cataract surgery at any time in the right eye

## 2020-11-20 NOTE — Assessment & Plan Note (Signed)
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

## 2020-11-22 ENCOUNTER — Other Ambulatory Visit: Payer: Self-pay

## 2020-11-22 ENCOUNTER — Encounter (INDEPENDENT_AMBULATORY_CARE_PROVIDER_SITE_OTHER): Payer: Self-pay

## 2020-11-22 ENCOUNTER — Ambulatory Visit (INDEPENDENT_AMBULATORY_CARE_PROVIDER_SITE_OTHER): Payer: Medicare Other

## 2020-11-22 DIAGNOSIS — H43822 Vitreomacular adhesion, left eye: Secondary | ICD-10-CM

## 2020-11-22 MED ORDER — PREDNISOLONE ACETATE 1 % OP SUSP: 1.0000 [drp] | Freq: Four times a day (QID) | OPHTHALMIC | 0 refills | Status: AC

## 2020-11-22 MED ORDER — OFLOXACIN 0.3 % OP SOLN: 1.0000 [drp] | Freq: Four times a day (QID) | OPHTHALMIC | 0 refills | Status: AC

## 2020-11-22 NOTE — Progress Notes (Signed)
11/22/2020     CHIEF COMPLAINT Patient presents for Pre-op Exam (Pre op PPV OS 12/05/20)   HISTORY OF PRESENT ILLNESS: Matthew [name] is a 80 y.o. adult who presents to the clinic today for:   HPI    Pre-op Exam     Additional comments: Pre op PPV OS 12/05/20       Last edited by Rockie Neighbours, [place] on 11/22/2020  3:49 PM. (History)        HISTORICAL INFORMATION:   Selected notes from the MEDICAL RECORD NUMBER    Lab Results  Component Value Date   HGBA1C 6.4 11/05/2020     CURRENT MEDICATIONS: Current Outpatient Medications (Ophthalmic Drugs)  Medication Sig  . ketorolac (ACULAR) 0.4 % SOLN Place 1 drop into the left eye 4 (four) times daily.   No current facility-administered medications for this visit. (Ophthalmic Drugs)   Current Outpatient Medications (Other)  Medication Sig  . aspirin EC 81 MG tablet Take 81 mg by mouth daily. Swallow whole.  Marland Kitchen atorvastatin (LIPITOR) 40 MG tablet Take 1 tablet by mouth once daily  . azelastine (ASTELIN) 0.1 % nasal spray Place 1 spray into both nostrils daily as needed for rhinitis. Use in each nostril as directed  . chlorthalidone (HYGROTON) 25 MG tablet Take 1 tablet by mouth once daily  . cholecalciferol (VITAMIN D3) 25 MCG (1000 UNIT) tablet Take 1,000 Units by mouth daily.  . colchicine 0.6 MG tablet Take 0.6 mg by mouth daily as needed (for gout).  . Continuous Blood Gluc Receiver (FREESTYLE LIBRE 14 DAY READER) DEVI 2 each by Does not apply route daily.  . Continuous Blood Gluc Sensor (FREESTYLE LIBRE 14 DAY SENSOR) MISC 2 each by Does not apply route daily.  . cyclobenzaprine (FLEXERIL) 5 MG tablet TAKE ONE TABLET BY MOUTH TWICE DAILY AS NEEDED FOR MUSCLE SPASM (Patient taking differently: Take 5 mg by mouth 2 (two) times daily as needed for muscle spasms.)  . fexofenadine (ALLEGRA) 180 MG tablet Take 180 mg by mouth daily.  . fluticasone (FLONASE) 50 MCG/ACT nasal spray USE 2 SPRAY(S) IN EACH NOSTRIL ONCE DAILY AS  NEEDED (FOR  ALLERGIES  OR  RHINITIS)  . metoprolol succinate (TOPROL-XL) 100 MG 24 hr tablet TAKE 1 TABLET EVERY DAY WITH OR IMMMEDIATELY FOLLOWING A MEAL  . NIFEdipine (PROCARDIA-XL) 30 MG (OSM) 24 hr tablet Take 30 mg by mouth 2 (two) times daily.  Marland Kitchen NOVOLIN 70/30 RELION (70-30) 100 UNIT/ML injection INJECT 22 UNITS INTO THE SKIN IN THE MORNING AND 20 UNITS IN THE EVENING (Patient taking differently: Inject 20-22 Units into the skin See admin instructions. Inject 22 units subcutaneously in the morning & inject 20 units subcutaneously in the evening)  . oxymetazoline (AFRIN) 0.05 % nasal spray Place 1 spray into both nostrils 2 (two) times daily as needed for congestion.  . pioglitazone (ACTOS) 30 MG tablet Take 1 tablet by mouth once daily  . probenecid (BENEMID) 500 MG tablet Take 1 tablet (500 mg total) by mouth 2 (two) times daily. For first month take half tablet twice daily then full tablet twice daily ongoing   No current facility-administered medications for this visit. (Other)     ALLERGIES Allergies  Allergen Reactions  . Penicillins Hives  . Tetracycline Hcl Hives  . Allopurinol Itching    PAST MEDICAL HISTORY Past Medical History:  Diagnosis Date  . CLAUDICATION 05/14/2007  . CORONARY ARTERY DISEASE 12/28/2006   Myoview 8/21: EF 73, normal perfusion, low risk   .  Diabetes mellitus type II, controlled ([place]) 12/28/2006   Actos 30mg , insulin 70/30 20 units BID, amaryl 1mg  previously a1c <8. Worsened due to cold over 5 weeks around 05/2014 eating poorly and eating sugary syrups.   Stomach irritation on metformin.  Lab Results  Component Value Date   HGBA1C 8.7* 06/05/2014       . DIABETES MELLITUS, TYPE II 12/28/2006  . Duodenal ulcer   . Erosive gastritis   . GERD (gastroesophageal reflux disease)   . GOUT 12/28/2006  . Hemorrhoids   . Hiatal hernia   . HYPERLIPIDEMIA 12/28/2006  . HYPERTENSION 12/28/2006  . LATERAL EPICONDYLITIS, RIGHT 12/11/2009  . Myocardial infarction  ([place])    23 yrs ago per pt  . Raynaud's disease   . RENAL FAILURE, CHRONIC 09/15/2008  . Tubular adenoma of colon 11/2010   Past Surgical History:  Procedure Laterality Date  . ABDOMINAL AORTOGRAM W/LOWER EXTREMITY N/A 02/10/2020   Procedure: ABDOMINAL AORTOGRAM W/LOWER EXTREMITY;  Surgeon: Elam Dutch, MD;  Location: [facility];  Service: Cardiovascular;  Laterality: N/A;  . CATARACT EXTRACTION Left 06/21/2020  . COLONOSCOPY    . CORONARY ARTERY BYPASS GRAFT    . ENDARTERECTOMY FEMORAL Left 02/27/2020   Procedure: LEFT COMMON FEMORAL ENDARTERECTOMY;  Surgeon: Elam Dutch, MD;  Location: [facility];  Service: Vascular;  Laterality: Left;  . FEMORAL-POPLITEAL BYPASS GRAFT Left 02/27/2020   Procedure: LEFT FEMORAL-ABOVE KNEE POPLITEAL BYPASS WITH NON REVERSED GREATER SAPHENOUS VEIN;  Surgeon: Elam Dutch, MD;  Location: [facility];  Service: Vascular;  Laterality: Left;  . FEMORAL-POPLITEAL BYPASS GRAFT Right 04/23/2020   Procedure: BYPASS GRAFT FEMORAL- Above Knee POPLITEAL ARTERY RIGHT Using Right Greater Saphenous Vein;  Surgeon: Elam Dutch, MD;  Location: [facility];  Service: Vascular;  Laterality: Right;  . KNEE ARTHROSCOPY  2012  . UPPER GASTROINTESTINAL ENDOSCOPY      FAMILY HISTORY Family History  Problem Relation Age of Onset  . Diabetes Mother   . Hypertension Mother   . Stroke Father   . Colon cancer Neg Hx   . Esophageal cancer Neg Hx   . Rectal cancer Neg Hx   . Stomach cancer Neg Hx     SOCIAL HISTORY Social History   Tobacco Use  . Smoking status: Former Smoker    Types: Cigarettes    Quit date: 07/09/1968    Years since quitting: 52.4  . Smokeless tobacco: Former Systems developer    Types: Chew    Quit date: 07/09/1988  . Tobacco comment: minimal use x 10 years   Vaping Use  . Vaping Use: Never used  Substance Use Topics  . Alcohol use: No    Comment: last was 6 months  . Drug use: No         OPHTHALMIC EXAM: Not recorded     IMAGING AND  PROCEDURES  Imaging and Procedures for @TODAY @           ASSESSMENT/PLAN:  No diagnosis found.  Ophthalmic Meds Ordered this visit:  No orders of the defined types were placed in this encounter.       Pre-op completed. Operative consent obtained with pre-op eye drops reviewed with [name] and sent via [facility] as needed. Post op instructions reviewed with patient and per patient all questions answered.  Rockie Neighbours, COA

## 2020-11-23 ENCOUNTER — Telehealth: Payer: Self-pay

## 2020-11-23 NOTE — Telephone Encounter (Signed)
Patient is requesting a call back regarding diabetic needles - either dexcon or freestyle needles please call back to inform patient [phone] (M)

## 2020-11-27 ENCOUNTER — Encounter: Payer: Self-pay | Admitting: Family Medicine

## 2020-11-27 NOTE — Telephone Encounter (Signed)
Returned pt call and he will call back and let me know which meter he decides to go with and I will send supplies at that time.

## 2020-11-27 NOTE — Telephone Encounter (Signed)
Called and lm for pt tcb. 

## 2020-11-28 ENCOUNTER — Ambulatory Visit (INDEPENDENT_AMBULATORY_CARE_PROVIDER_SITE_OTHER): Payer: Medicare Other

## 2020-12-05 ENCOUNTER — Encounter (AMBULATORY_SURGERY_CENTER): Payer: Medicare Other | Admitting: Ophthalmology

## 2020-12-05 DIAGNOSIS — H43822 Vitreomacular adhesion, left eye: Secondary | ICD-10-CM

## 2020-12-05 DIAGNOSIS — H35342 Macular cyst, hole, or pseudohole, left eye: Secondary | ICD-10-CM | POA: Diagnosis not present

## 2020-12-06 ENCOUNTER — Encounter (INDEPENDENT_AMBULATORY_CARE_PROVIDER_SITE_OTHER): Payer: Medicare Other | Admitting: Ophthalmology

## 2020-12-06 ENCOUNTER — Ambulatory Visit (INDEPENDENT_AMBULATORY_CARE_PROVIDER_SITE_OTHER): Payer: PRIVATE HEALTH INSURANCE | Admitting: Ophthalmology

## 2020-12-06 ENCOUNTER — Other Ambulatory Visit: Payer: Self-pay

## 2020-12-06 ENCOUNTER — Encounter (INDEPENDENT_AMBULATORY_CARE_PROVIDER_SITE_OTHER): Payer: Self-pay | Admitting: Ophthalmology

## 2020-12-06 DIAGNOSIS — H43822 Vitreomacular adhesion, left eye: Secondary | ICD-10-CM

## 2020-12-06 DIAGNOSIS — Z961 Presence of intraocular lens: Secondary | ICD-10-CM

## 2020-12-06 NOTE — Assessment & Plan Note (Signed)
Postop day 1 looks great, negative Watzke sign, clear  Commence with topical medications ofloxacin 1 drop left eye 4 times daily  Prednisolone acetate 1 drop left eye 4 times daily

## 2020-12-06 NOTE — Patient Instructions (Signed)

## 2020-12-06 NOTE — Assessment & Plan Note (Signed)
Stable condition

## 2020-12-06 NOTE — Progress Notes (Signed)
12/06/2020     CHIEF COMPLAINT Patient presents for Post-op Follow-up (1 day post op os sx 12/05/2020/Pt states.," I have not had any issues at all. No pain. My vision seems to be a little better."/Denies any Fol/Floaters/Pt has Ofloxacin and Pred gtts that he was using QID OS prior to sx)   HISTORY OF PRESENT ILLNESS: Matthew [name] is a 80 y.o. adult who presents to the clinic today for:   HPI     Post-op Follow-up           Laterality: left eye   Discomfort: none.  Negative for pain and tearing   Vision: is stable   Comments: 1 day post op os sx 12/05/2020 Pt states.," I have not had any issues at all. No pain. My vision seems to be a little better." Denies any Fol/Floaters Pt has Ofloxacin and Pred gtts that he was using QID OS prior to sx         Comments   No discomfort last night postop vitrectomy for VMT      Last edited by Hurman Horn, MD on 12/06/2020  2:03 PM.      Referring physician: Marin Olp, MD [address],  [place] [id number]  HISTORICAL INFORMATION:   Selected notes from the MEDICAL RECORD NUMBER    Lab Results  Component Value Date   HGBA1C 6.4 11/05/2020     CURRENT MEDICATIONS: Current Outpatient Medications (Ophthalmic Drugs)  Medication Sig   ketorolac (ACULAR) 0.4 % SOLN Place 1 drop into the left eye 4 (four) times daily.   ofloxacin (OCUFLOX) 0.3 % ophthalmic solution Place 1 drop into the left eye 4 (four) times daily for 21 days.   prednisoLONE acetate (PRED FORTE) 1 % ophthalmic suspension Place 1 drop into the left eye 4 (four) times daily for 21 days.   No current facility-administered medications for this visit. (Ophthalmic Drugs)   Current Outpatient Medications (Other)  Medication Sig   aspirin EC 81 MG tablet Take 81 mg by mouth daily. Swallow whole.   atorvastatin (LIPITOR) 40 MG tablet Take 1 tablet by mouth once daily   azelastine (ASTELIN) 0.1 % nasal spray Place 1 spray into both nostrils daily  as needed for rhinitis. Use in each nostril as directed   chlorthalidone (HYGROTON) 25 MG tablet Take 1 tablet by mouth once daily   cholecalciferol (VITAMIN D3) 25 MCG (1000 UNIT) tablet Take 1,000 Units by mouth daily.   colchicine 0.6 MG tablet Take 0.6 mg by mouth daily as needed (for gout).   Continuous Blood Gluc Receiver (FREESTYLE LIBRE 14 DAY READER) DEVI 2 each by Does not apply route daily.   Continuous Blood Gluc Sensor (FREESTYLE LIBRE 14 DAY SENSOR) MISC 2 each by Does not apply route daily.   cyclobenzaprine (FLEXERIL) 5 MG tablet TAKE ONE TABLET BY MOUTH TWICE DAILY AS NEEDED FOR MUSCLE SPASM (Patient taking differently: Take 5 mg by mouth 2 (two) times daily as needed for muscle spasms.)   fexofenadine (ALLEGRA) 180 MG tablet Take 180 mg by mouth daily.   fluticasone (FLONASE) 50 MCG/ACT nasal spray USE 2 SPRAY(S) IN EACH NOSTRIL ONCE DAILY AS NEEDED (FOR  ALLERGIES  OR  RHINITIS)   metoprolol succinate (TOPROL-XL) 100 MG 24 hr tablet TAKE 1 TABLET EVERY DAY WITH OR IMMMEDIATELY FOLLOWING A MEAL   NIFEdipine (PROCARDIA-XL) 30 MG (OSM) 24 hr tablet Take 30 mg by mouth 2 (two) times daily.   NOVOLIN 70/30  RELION (70-30) 100 UNIT/ML injection INJECT 22 UNITS INTO THE SKIN IN THE MORNING AND 20 UNITS IN THE EVENING (Patient taking differently: Inject 20-22 Units into the skin See admin instructions. Inject 22 units subcutaneously in the morning & inject 20 units subcutaneously in the evening)   oxymetazoline (AFRIN) 0.05 % nasal spray Place 1 spray into both nostrils 2 (two) times daily as needed for congestion.   pioglitazone (ACTOS) 30 MG tablet Take 1 tablet by mouth once daily   probenecid (BENEMID) 500 MG tablet Take 1 tablet (500 mg total) by mouth 2 (two) times daily. For first month take half tablet twice daily then full tablet twice daily ongoing   No current facility-administered medications for this visit. (Other)      REVIEW OF SYSTEMS:    ALLERGIES Allergies   Allergen Reactions   Penicillins Hives   Tetracycline Hcl Hives   Allopurinol Itching    PAST MEDICAL HISTORY Past Medical History:  Diagnosis Date   CLAUDICATION 05/14/2007   CORONARY ARTERY DISEASE 12/28/2006   Myoview 8/21: EF 73, normal perfusion, low risk    Diabetes mellitus type II, controlled ([place]) 12/28/2006   Actos 30mg , insulin 70/30 20 units BID, amaryl 1mg  previously a1c <8. Worsened due to cold over 5 weeks around 05/2014 eating poorly and eating sugary syrups.   Stomach irritation on metformin.  Lab Results  Component Value Date   HGBA1C 8.7* 06/05/2014        DIABETES MELLITUS, TYPE II 12/28/2006   Duodenal ulcer    Erosive gastritis    GERD (gastroesophageal reflux disease)    GOUT 12/28/2006   Hemorrhoids    Hiatal hernia    HYPERLIPIDEMIA 12/28/2006   HYPERTENSION 12/28/2006   LATERAL EPICONDYLITIS, RIGHT 12/11/2009   Myocardial infarction ([place])    23 yrs ago per pt   Raynaud's disease    RENAL FAILURE, CHRONIC 09/15/2008   Tubular adenoma of colon 11/2010   Past Surgical History:  Procedure Laterality Date   ABDOMINAL AORTOGRAM W/LOWER EXTREMITY N/A 02/10/2020   Procedure: ABDOMINAL AORTOGRAM W/LOWER EXTREMITY;  Surgeon: Elam Dutch, MD;  Location: [facility];  Service: Cardiovascular;  Laterality: N/A;   CATARACT EXTRACTION Left 06/21/2020   COLONOSCOPY     CORONARY ARTERY BYPASS GRAFT     ENDARTERECTOMY FEMORAL Left 02/27/2020   Procedure: LEFT COMMON FEMORAL ENDARTERECTOMY;  Surgeon: Elam Dutch, MD;  Location: [facility] OR;  Service: Vascular;  Laterality: Left;   FEMORAL-POPLITEAL BYPASS GRAFT Left 02/27/2020   Procedure: LEFT FEMORAL-ABOVE KNEE POPLITEAL BYPASS WITH NON REVERSED GREATER SAPHENOUS VEIN;  Surgeon: Elam Dutch, MD;  Location: [facility];  Service: Vascular;  Laterality: Left;   FEMORAL-POPLITEAL BYPASS GRAFT Right 04/23/2020   Procedure: BYPASS GRAFT FEMORAL- Above Knee POPLITEAL ARTERY RIGHT Using Right Greater Saphenous Vein;   Surgeon: Elam Dutch, MD;  Location[facility];  Service: Vascular;  Laterality: Right;   KNEE ARTHROSCOPY  2012   UPPER GASTROINTESTINAL ENDOSCOPY      FAMILY HISTORY Family History  Problem Relation Age of Onset   Diabetes Mother    Hypertension Mother    Stroke Father    Colon cancer Neg Hx    Esophageal cancer Neg Hx    Rectal cancer Neg Hx    Stomach cancer Neg Hx     SOCIAL HISTORY Social History   Tobacco Use   Smoking status: Former    Pack years: 0.00    Types: Cigarettes    Quit date:  07/09/1968    Years since quitting: 52.4   Smokeless tobacco: Former    Types: Chew    Quit date: 07/09/1988   Tobacco comments:    minimal use x 10 years   Vaping Use   Vaping Use: Never used  Substance Use Topics   Alcohol use: No    Comment: last was 6 months   Drug use: No         OPHTHALMIC EXAM:  Base Eye Exam     Visual Acuity (ETDRS)       Right Left   Dist [place] 20/100 20/40 -2   Dist ph [place] 20/40 +1 NI         Tonometry (Tonopen, 1:50 PM)       Right Left   Pressure 11 13         Pupils       Pupils Dark Light Shape React APD   Right PERRL 4 3 Round Brisk None   Left PERRL 7 7 Dilated           Neuro/Psych     Oriented x3: Yes   Mood/Affect: Normal         Dilation     Left eye: 1.0% Mydriacyl, 2.5% Phenylephrine @ 1:48 PM           Slit Lamp and Fundus Exam     External Exam       Right Left   External Normal Normal         Slit Lamp Exam       Right Left   Lids/Lashes Normal Normal   Conjunctiva/Sclera White and quiet White and quiet   Cornea Clear Clear   Anterior Chamber Deep and quiet Deep and quiet   Iris Round and reactive Round and reactive   Lens 3+ Nuclear sclerosis Centered posterior chamber intraocular lens   Anterior Vitreous Normal Normal         Fundus Exam       Right Left   Posterior Vitreous Normal Normal   Disc Normal Normal   C/D Ratio 0.55 0.5   Macula Microaneurysms, Pseudohole  with pseudo cystoid change likely a serous elevation noted on OCT  Microaneurysms, no macular hole, negative Watzke sign   Vessels Normal Normal   Periphery Normal Normal            IMAGING AND PROCEDURES  Imaging and Procedures for 12/06/20           ASSESSMENT/PLAN:  Vitreomacular adhesion of left eye Postop day 1 looks great, negative Watzke sign, clear  Commence with topical medications ofloxacin 1 drop left eye 4 times daily  Prednisolone acetate 1 drop left eye 4 times daily  Pseudophakia of left eye Stable condition     ICD-10-CM   1. Vitreomacular adhesion of left eye  H43.822     2. Pseudophakia of left eye  Z96.1       1.  2.  3.  Ophthalmic Meds Ordered this visit:  No orders of the defined types were placed in this encounter.      Return for  Return visit 4 to 7 days , dilate, OS, POST OP, OCT.  There are no Patient Instructions on file for this visit.   Explained the diagnoses, plan, and follow up with the patient and they expressed understanding.  Patient expressed understanding of the importance of proper follow up care.   Clent Demark Naren Benally M.D. Diseases & Surgery of the Retina and Vitreous  Retina & Diabetic [place] 12/06/20     Abbreviations: M myopia (nearsighted); A astigmatism; H hyperopia (farsighted); P presbyopia; Mrx spectacle prescription;  CTL contact lenses; OD right eye; OS left eye; OU both eyes  XT exotropia; ET esotropia; PEK punctate epithelial keratitis; PEE punctate epithelial erosions; DES dry eye syndrome; MGD meibomian gland dysfunction; ATs artificial tears; PFAT's preservative free artificial tears; [place] nuclear sclerotic cataract; PSC posterior subcapsular cataract; ERM epi-retinal membrane; PVD posterior vitreous detachment; RD retinal detachment; DM diabetes mellitus; DR diabetic retinopathy; NPDR non-proliferative diabetic retinopathy; PDR proliferative diabetic retinopathy; CSME clinically significant macular  edema; DME diabetic macular edema; dbh dot blot hemorrhages; CWS cotton wool spot; POAG primary open angle glaucoma; C/D cup-to-disc ratio; HVF humphrey visual field; GVF goldmann visual field; OCT optical coherence tomography; IOP intraocular pressure; BRVO Branch retinal vein occlusion; CRVO central retinal vein occlusion; CRAO central retinal artery occlusion; BRAO branch retinal artery occlusion; RT retinal tear; SB scleral buckle; PPV pars plana vitrectomy; VH Vitreous hemorrhage; PRP panretinal laser photocoagulation; IVK intravitreal kenalog; VMT vitreomacular traction; MH Macular hole;  NVD neovascularization of the disc; NVE neovascularization elsewhere; AREDS age related eye disease study; ARMD age related macular degeneration; POAG primary open angle glaucoma; EBMD epithelial/anterior basement membrane dystrophy; ACIOL anterior chamber intraocular lens; IOL intraocular lens; PCIOL posterior chamber intraocular lens; Phaco/IOL phacoemulsification with intraocular lens placement; [place] photorefractive keratectomy; LASIK laser assisted in situ keratomileusis; HTN hypertension; DM diabetes mellitus; COPD chronic obstructive pulmonary disease

## 2020-12-10 ENCOUNTER — Encounter (INDEPENDENT_AMBULATORY_CARE_PROVIDER_SITE_OTHER): Payer: Medicare Other | Admitting: Ophthalmology

## 2020-12-11 ENCOUNTER — Other Ambulatory Visit: Payer: Self-pay | Admitting: Family Medicine

## 2020-12-12 ENCOUNTER — Encounter (INDEPENDENT_AMBULATORY_CARE_PROVIDER_SITE_OTHER): Payer: Medicare Other | Admitting: Ophthalmology

## 2020-12-13 ENCOUNTER — Ambulatory Visit (INDEPENDENT_AMBULATORY_CARE_PROVIDER_SITE_OTHER): Payer: Medicare Other | Admitting: Ophthalmology

## 2020-12-13 ENCOUNTER — Other Ambulatory Visit: Payer: Self-pay

## 2020-12-13 ENCOUNTER — Encounter (INDEPENDENT_AMBULATORY_CARE_PROVIDER_SITE_OTHER): Payer: Self-pay | Admitting: Ophthalmology

## 2020-12-13 DIAGNOSIS — H43821 Vitreomacular adhesion, right eye: Secondary | ICD-10-CM

## 2020-12-13 DIAGNOSIS — H43822 Vitreomacular adhesion, left eye: Secondary | ICD-10-CM

## 2020-12-13 NOTE — Patient Instructions (Signed)
Ofloxacin  4 times daily to the operative eye  Prednisolone acetate 1 drop to the operative eye 4 times daily  Patient instructed not to refill the medications and use them for maximum of 3 weeks.  Patient instructed do not rub the eye.  Patient has the option to use the patch at night. 

## 2020-12-13 NOTE — Assessment & Plan Note (Signed)
Patient instructed to continue on topical eye medications left eye for only 2 more weeks.  If the drops run out he is not to refill the drops.  If he has drops remaining at the end of 2 weeks he is to throw those bottles away  OS anatomy has vastly improved with outer photoreceptor layer now healing with no serous retinal detachment and perifoveal CME much smaller today only 1 week post vitrectomy

## 2020-12-13 NOTE — Progress Notes (Signed)
12/13/2020     CHIEF COMPLAINT Patient presents for Post-op Follow-up (1 Wk POV OS//Pt sts VA OS has improved, however he began seeing floaters yesterday OS. Pt denies flashes of light or ocular pain. Pt sts he sees a "clear raindrop in [place]" OS. VA OD stable.)   HISTORY OF PRESENT ILLNESS: Matthew [name] is a 80 y.o. adult who presents to the clinic today for:   HPI     Post-op Follow-up           Laterality: left eye   Discomfort: floaters   Vision: is stable   Comments: 1 Wk POV OS  Pt sts VA OS has improved, however he began seeing floaters yesterday OS. Pt denies flashes of light or ocular pain. Pt sts he sees a "clear raindrop in [place]" OS. VA OD stable.       Last edited by Milly Jakob, [place] on 12/13/2020  2:02 PM.      Referring physician: Marin Olp, [address],  [place] [id number]  HISTORICAL INFORMATION:   Selected notes from the MEDICAL RECORD NUMBER    Lab Results  Component Value Date   HGBA1C 6.4 11/05/2020     CURRENT MEDICATIONS: Current Outpatient Medications (Ophthalmic Drugs)  Medication Sig   ketorolac (ACULAR) 0.4 % SOLN Place 1 drop into the left eye 4 (four) times daily.   ofloxacin (OCUFLOX) 0.3 % ophthalmic solution Place 1 drop into the left eye 4 (four) times daily for 21 days.   prednisoLONE acetate (PRED FORTE) 1 % ophthalmic suspension Place 1 drop into the left eye 4 (four) times daily for 21 days.   No current facility-administered medications for this visit. (Ophthalmic Drugs)   Current Outpatient Medications (Other)  Medication Sig   aspirin EC 81 MG tablet Take 81 mg by mouth daily. Swallow whole.   atorvastatin (LIPITOR) 40 MG tablet Take 1 tablet by mouth once daily   azelastine (ASTELIN) 0.1 % nasal spray Place 1 spray into both nostrils daily as needed for rhinitis. Use in each nostril as directed   chlorthalidone (HYGROTON) 25 MG tablet Take 1 tablet by mouth once daily   cholecalciferol (VITAMIN D3)  25 MCG (1000 UNIT) tablet Take 1,000 Units by mouth daily.   colchicine 0.6 MG tablet Take 0.6 mg by mouth daily as needed (for gout).   Continuous Blood Gluc Receiver (FREESTYLE LIBRE 14 DAY READER) DEVI 2 each by Does not apply route daily.   Continuous Blood Gluc Sensor (FREESTYLE LIBRE 14 DAY SENSOR) MISC 2 each by Does not apply route daily.   cyclobenzaprine (FLEXERIL) 5 MG tablet TAKE ONE TABLET BY MOUTH TWICE DAILY AS NEEDED FOR MUSCLE SPASM (Patient taking differently: Take 5 mg by mouth 2 (two) times daily as needed for muscle spasms.)   fexofenadine (ALLEGRA) 180 MG tablet Take 180 mg by mouth daily.   fluticasone (FLONASE) 50 MCG/ACT nasal spray USE 2 SPRAY(S) IN EACH NOSTRIL ONCE DAILY AS NEEDED (FOR  ALLERGIES  OR  RHINITIS)   metoprolol succinate (TOPROL-XL) 100 MG 24 hr tablet TAKE 1 TABLET EVERY DAY WITH OR IMMMEDIATELY FOLLOWING A MEAL   NIFEdipine (PROCARDIA-XL) 30 MG (OSM) 24 hr tablet Take 30 mg by mouth 2 (two) times daily.   NOVOLIN 70/30 RELION (70-30) 100 UNIT/ML injection INJECT 22 UNITS INTO THE SKIN IN THE MORNING AND 20 UNITS IN THE EVENING (Patient taking differently: Inject 20-22 Units into the skin See admin instructions. Inject 22 units subcutaneously  in the morning & inject 20 units subcutaneously in the evening)   oxymetazoline (AFRIN) 0.05 % nasal spray Place 1 spray into both nostrils 2 (two) times daily as needed for congestion.   pioglitazone (ACTOS) 30 MG tablet Take 1 tablet by mouth once daily   probenecid (BENEMID) 500 MG tablet Take 1 tablet (500 mg total) by mouth 2 (two) times daily. For first month take half tablet twice daily then full tablet twice daily ongoing   No current facility-administered medications for this visit. (Other)      REVIEW OF SYSTEMS:    ALLERGIES Allergies  Allergen Reactions   Penicillins Hives   Tetracycline Hcl Hives   Allopurinol Itching    PAST MEDICAL HISTORY Past Medical History:  Diagnosis Date    CLAUDICATION 05/14/2007   CORONARY ARTERY DISEASE 12/28/2006   Myoview 8/21: EF 73, normal perfusion, low risk    Diabetes mellitus type II, controlled ([place]) 12/28/2006   Actos 30mg , insulin 70/30 20 units BID, amaryl 1mg  previously a1c <8. Worsened due to cold over 5 weeks around 05/2014 eating poorly and eating sugary syrups.   Stomach irritation on metformin.  Lab Results  Component Value Date   HGBA1C 8.7* 06/05/2014        DIABETES MELLITUS, TYPE II 12/28/2006   Duodenal ulcer    Erosive gastritis    GERD (gastroesophageal reflux disease)    GOUT 12/28/2006   Hemorrhoids    Hiatal hernia    HYPERLIPIDEMIA 12/28/2006   HYPERTENSION 12/28/2006   LATERAL EPICONDYLITIS, RIGHT 12/11/2009   Myocardial infarction ([place])    23 yrs ago per pt   Raynaud's disease    RENAL FAILURE, CHRONIC 09/15/2008   Tubular adenoma of colon 11/2010   Past Surgical History:  Procedure Laterality Date   ABDOMINAL AORTOGRAM W/LOWER EXTREMITY N/A 02/10/2020   Procedure: ABDOMINAL AORTOGRAM W/LOWER EXTREMITY;  Surgeon: Elam Dutch, MD;  Location: [facility];  Service: Cardiovascular;  Laterality: N/A;   CATARACT EXTRACTION Left 06/21/2020   COLONOSCOPY     CORONARY ARTERY BYPASS GRAFT     ENDARTERECTOMY FEMORAL Left 02/27/2020   Procedure: LEFT COMMON FEMORAL ENDARTERECTOMY;  Surgeon: Elam Dutch, MD;  Location: [facility];  Service: Vascular;  Laterality: Left;   FEMORAL-POPLITEAL BYPASS GRAFT Left 02/27/2020   Procedure: LEFT FEMORAL-ABOVE KNEE POPLITEAL BYPASS WITH NON REVERSED GREATER SAPHENOUS VEIN;  Surgeon: Elam Dutch, MD;  Location: [facility];  Service: Vascular;  Laterality: Left;   FEMORAL-POPLITEAL BYPASS GRAFT Right 04/23/2020   Procedure: BYPASS GRAFT FEMORAL- Above Knee POPLITEAL ARTERY RIGHT Using Right Greater Saphenous Vein;  Surgeon: Elam Dutch, MD;  Location: [facility] OR;  Service: Vascular;  Laterality: Right;   KNEE ARTHROSCOPY  2012   UPPER GASTROINTESTINAL ENDOSCOPY       FAMILY HISTORY Family History  Problem Relation Age of Onset   Diabetes Mother    Hypertension Mother    Stroke Father    Colon cancer Neg Hx    Esophageal cancer Neg Hx    Rectal cancer Neg Hx    Stomach cancer Neg Hx     SOCIAL HISTORY Social History   Tobacco Use   Smoking status: Former    Pack years: 0.00    Types: Cigarettes    Quit date: 07/09/1968    Years since quitting: 52.4   Smokeless tobacco: Former    Types: Chew    Quit date: 07/09/1988   Tobacco comments:    minimal use x 10 years  Vaping Use   Vaping Use: Never used  Substance Use Topics   Alcohol use: No    Comment: last was 6 months   Drug use: No         OPHTHALMIC EXAM:  Base Eye Exam     Visual Acuity (ETDRS)       Right Left   Dist [place] 20/200 20/25 -2   Dist ph [place] 20/40          Tonometry (Tonopen, 2:06 PM)       Right Left   Pressure 16 13         Pupils       Pupils Dark Light Shape React APD   Right PERRL 4 3 Round Brisk None   Left PERRL 4 3 Round Brisk None         Visual Fields (Counting fingers)       Left Right    Full Full         Extraocular Movement       Right Left    Full Full         Neuro/Psych     Oriented x3: Yes   Mood/Affect: Normal         Dilation     Left eye: 1.0% Mydriacyl, 2.5% Phenylephrine @ 2:06 PM           Slit Lamp and Fundus Exam     External Exam       Right Left   External Normal Normal         Slit Lamp Exam       Right Left   Lids/Lashes Normal Normal   Conjunctiva/Sclera White and quiet White and quiet   Cornea Clear Clear   Anterior Chamber Deep and quiet Deep and quiet   Iris Round and reactive Round and reactive   Lens 3+ Nuclear sclerosis Centered posterior chamber intraocular lens   Anterior Vitreous Normal Normal         Fundus Exam       Right Left   Posterior Vitreous  Normal   Disc  Normal   C/D Ratio  0.5   Macula   Microaneurysms, no macular hole, negative Watzke  sign   Vessels  Normal   Periphery  Normal            IMAGING AND PROCEDURES  Imaging and Procedures for 12/13/20  OCT, Retina - OU - Both Eyes       Right Eye Quality was good. Scan locations included subfoveal. Central Foveal Thickness: 318. Progression has been stable. Findings include abnormal foveal contour, vitreous traction.   Left Eye Quality was good. Scan locations included subfoveal. Central Foveal Thickness: 305. Progression has improved. Findings include abnormal foveal contour.   Notes Vitreal macular traction with small serous subfoveal detachment OD OS, vastly improved 1 week post vitrectomy for VMT with inner and outer foveal distortion and vision loss..  Observe  Left less CME as well             ASSESSMENT/PLAN:  Vitreomacular adhesion of left eye Patient instructed to continue on topical eye medications left eye for only 2 more weeks.  If the drops run out he is not to refill the drops.  If he has drops remaining at the end of 2 weeks he is to throw those bottles away  OS anatomy has vastly improved with outer photoreceptor layer now healing with no serous retinal detachment and perifoveal CME much smaller  today only 1 week post vitrectomy  Vitreomacular traction syndrome of right eye Stable findings with inner and outer foveal distortion from VMT OD yet with good acuity will continue to carefully observe OD     ICD-10-CM   1. Vitreomacular adhesion of left eye  H43.822 OCT, Retina - OU - Both Eyes    2. Vitreomacular traction syndrome of right eye  H43.821       1.  OS, vastly improved macular anatomy and now acuity 1 week post vitrectomy for significant vitreal macular traction and distortion.  Will observe  2.  OD will continue to observe  3.  Ophthalmic Meds Ordered this visit:  No orders of the defined types were placed in this encounter.      Return in about 3 months (around 03/15/2021) for DILATE OU, OCT.  There are no Patient  Instructions on file for this visit.   Explained the diagnoses, plan, and follow up with the patient and they expressed understanding.  Patient expressed understanding of the importance of proper follow up care.   Clent Demark Eddi Hymes M.D. Diseases & Surgery of the Retina and Vitreous Retina & Diabetic [place] 12/13/20     Abbreviations: M myopia (nearsighted); A astigmatism; H hyperopia (farsighted); P presbyopia; Mrx spectacle prescription;  CTL contact lenses; OD right eye; OS left eye; OU both eyes  XT exotropia; ET esotropia; PEK punctate epithelial keratitis; PEE punctate epithelial erosions; DES dry eye syndrome; MGD meibomian gland dysfunction; ATs artificial tears; PFAT's preservative free artificial tears; [place] nuclear sclerotic cataract; PSC posterior subcapsular cataract; ERM epi-retinal membrane; PVD posterior vitreous detachment; RD retinal detachment; DM diabetes mellitus; DR diabetic retinopathy; NPDR non-proliferative diabetic retinopathy; PDR proliferative diabetic retinopathy; CSME clinically significant macular edema; DME diabetic macular edema; dbh dot blot hemorrhages; CWS cotton wool spot; POAG primary open angle glaucoma; C/D cup-to-disc ratio; HVF humphrey visual field; GVF goldmann visual field; OCT optical coherence tomography; IOP intraocular pressure; BRVO Branch retinal vein occlusion; CRVO central retinal vein occlusion; CRAO central retinal artery occlusion; BRAO branch retinal artery occlusion; RT retinal tear; SB scleral buckle; PPV pars plana vitrectomy; VH Vitreous hemorrhage; PRP panretinal laser photocoagulation; IVK intravitreal kenalog; VMT vitreomacular traction; MH Macular hole;  NVD neovascularization of the disc; NVE neovascularization elsewhere; AREDS age related eye disease study; ARMD age related macular degeneration; POAG primary open angle glaucoma; EBMD epithelial/anterior basement membrane dystrophy; ACIOL anterior chamber intraocular lens; IOL intraocular  lens; PCIOL posterior chamber intraocular lens; Phaco/IOL phacoemulsification with intraocular lens placement; [place] photorefractive keratectomy; LASIK laser assisted in situ keratomileusis; HTN hypertension; DM diabetes mellitus; COPD chronic obstructive pulmonary disease

## 2020-12-13 NOTE — Assessment & Plan Note (Signed)
Stable findings with inner and outer foveal distortion from VMT OD yet with good acuity will continue to carefully observe OD

## 2020-12-21 ENCOUNTER — Other Ambulatory Visit: Payer: Self-pay | Admitting: Family Medicine

## 2021-01-07 ENCOUNTER — Other Ambulatory Visit: Payer: Self-pay | Admitting: Family Medicine

## 2021-01-07 DIAGNOSIS — N1831 Chronic kidney disease, stage 3a: Secondary | ICD-10-CM | POA: Diagnosis not present

## 2021-01-07 LAB — BASIC METABOLIC PANEL
BUN: 35 — AB (ref 4–21)
CO2: 28 — AB (ref 13–22)
Chloride: 100 (ref 99–108)
Creatinine: 1.7
Glucose: 260
Potassium: 4.6 (ref 3.4–5.3)
Sodium: 137 (ref 137–147)

## 2021-01-07 LAB — CBC AND DIFFERENTIAL
HCT: 43 (ref 39–52)
Hemoglobin: 14 (ref 13.0–17.0)
Neutrophils Absolute: 4.3
Platelets: 194 (ref 150–399)
WBC: 6

## 2021-01-07 LAB — COMPREHENSIVE METABOLIC PANEL
Albumin: 4.3 (ref 3.5–5.0)
Calcium: 9.9 (ref 8.7–10.7)

## 2021-01-07 LAB — CBC: RBC: 4.97 (ref 3.87–5.11)

## 2021-01-09 DIAGNOSIS — E119 Type 2 diabetes mellitus without complications: Secondary | ICD-10-CM | POA: Diagnosis not present

## 2021-01-09 DIAGNOSIS — H25011 Cortical age-related cataract, right eye: Secondary | ICD-10-CM | POA: Diagnosis not present

## 2021-01-09 DIAGNOSIS — H43823 Vitreomacular adhesion, bilateral: Secondary | ICD-10-CM | POA: Diagnosis not present

## 2021-01-09 DIAGNOSIS — Z9889 Other specified postprocedural states: Secondary | ICD-10-CM | POA: Diagnosis not present

## 2021-01-09 DIAGNOSIS — Z961 Presence of intraocular lens: Secondary | ICD-10-CM | POA: Diagnosis not present

## 2021-01-09 DIAGNOSIS — Z794 Long term (current) use of insulin: Secondary | ICD-10-CM | POA: Diagnosis not present

## 2021-01-14 ENCOUNTER — Other Ambulatory Visit: Payer: Self-pay | Admitting: Family Medicine

## 2021-01-15 DIAGNOSIS — I129 Hypertensive chronic kidney disease with stage 1 through stage 4 chronic kidney disease, or unspecified chronic kidney disease: Secondary | ICD-10-CM | POA: Diagnosis not present

## 2021-01-15 DIAGNOSIS — M109 Gout, unspecified: Secondary | ICD-10-CM | POA: Diagnosis not present

## 2021-01-15 DIAGNOSIS — R609 Edema, unspecified: Secondary | ICD-10-CM | POA: Diagnosis not present

## 2021-01-15 DIAGNOSIS — N1831 Chronic kidney disease, stage 3a: Secondary | ICD-10-CM | POA: Diagnosis not present

## 2021-01-15 DIAGNOSIS — E559 Vitamin D deficiency, unspecified: Secondary | ICD-10-CM | POA: Diagnosis not present

## 2021-01-18 ENCOUNTER — Encounter: Payer: Self-pay | Admitting: Family Medicine

## 2021-01-28 DIAGNOSIS — H2511 Age-related nuclear cataract, right eye: Secondary | ICD-10-CM | POA: Diagnosis not present

## 2021-01-29 DIAGNOSIS — H25011 Cortical age-related cataract, right eye: Secondary | ICD-10-CM | POA: Diagnosis not present

## 2021-01-29 DIAGNOSIS — H268 Other specified cataract: Secondary | ICD-10-CM | POA: Diagnosis not present

## 2021-01-30 ENCOUNTER — Encounter (INDEPENDENT_AMBULATORY_CARE_PROVIDER_SITE_OTHER): Payer: Self-pay

## 2021-02-13 DIAGNOSIS — H43823 Vitreomacular adhesion, bilateral: Secondary | ICD-10-CM | POA: Diagnosis not present

## 2021-02-13 DIAGNOSIS — Z961 Presence of intraocular lens: Secondary | ICD-10-CM | POA: Diagnosis not present

## 2021-02-14 ENCOUNTER — Ambulatory Visit (INDEPENDENT_AMBULATORY_CARE_PROVIDER_SITE_OTHER)
Admission: RE | Admit: 2021-02-14 | Discharge: 2021-02-14 | Disposition: A | Discharge status: Home / Self Care | Payer: Medicare Other | Source: Ambulatory Visit | Attending: Physician Assistant | Admitting: Physician Assistant

## 2021-02-14 ENCOUNTER — Ambulatory Visit (INDEPENDENT_AMBULATORY_CARE_PROVIDER_SITE_OTHER): Payer: Medicare Other | Admitting: Physician Assistant

## 2021-02-14 ENCOUNTER — Encounter: Payer: Self-pay | Admitting: Physician Assistant

## 2021-02-14 ENCOUNTER — Other Ambulatory Visit: Payer: Self-pay

## 2021-02-14 ENCOUNTER — Ambulatory Visit (HOSPITAL_COMMUNITY)
Admission: RE | Admit: 2021-02-14 | Discharge: 2021-02-14 | Disposition: A | Discharge status: Home / Self Care | Payer: Medicare Other | Source: Ambulatory Visit | Attending: Physician Assistant | Admitting: Physician Assistant

## 2021-02-14 VITALS — BP 145/62 | HR 55 | Temp 97.9°F | Resp 20 | Ht 72.0 in | Wt 195.0 lb

## 2021-02-14 DIAGNOSIS — I739 Peripheral vascular disease, unspecified: Secondary | ICD-10-CM | POA: Diagnosis not present

## 2021-02-14 DIAGNOSIS — I2510 Atherosclerotic heart disease of native coronary artery without angina pectoris: Secondary | ICD-10-CM

## 2021-02-14 NOTE — Progress Notes (Signed)
Office Note     CC:  follow up Requesting Provider:  Marin Olp, MD  HPI: Matthew [name] is a 80 y.o. (27-Feb-1941) adult who presents presents for follow up of peripheral vascular disease. He has history of right femoral to above knee popliteal bypass by Dr. Oneida Alar in October of 2021. He also has undergone left femoral endarterectomy and left femoral to above knee popliteal vein bypass in August of 2021 by Dr. Oneida Alar. At his last visit in February he was doing very well. He was having some discomfort on elevation of his legs but this would subside once they were not elevated. He also had continued swelling and was wearing compression stockings to help. Bilateral bypass grafts were patent clinically and ABIs were stable.    Today he explains that he does have intermittent discomfort in both legs on ambulation. This usually occurs after prolonged distance of > 5-7 blocks. It will improve with rest. He describes it as "just a pain". He otherwise does not have any rest pain or tissue loss. He also has some swelling below the knee bilaterally, right greater than left. He does elevate in recliner and he will wear compression stockings now daily. He does not feel that the swelling is any worse than in the past. He also has neuropathy with tingling and pains in his feet. He also describes a "damp" or "wet" feeling of his toes. He says it is just the sensation that they are but when he checks they are dry. He has used topical powders etc to try to make it feel better but nothing really helps. He has been compliant with his Asprin and statin medications.   The pt is on a statin for cholesterol management.  The pt is on a daily aspirin.   Other AC: none The pt is on BB for hypertension.   The pt is diabetic.  Tobacco hx: Former, 1970  Past Medical History:  Diagnosis Date   CLAUDICATION 05/14/2007   CORONARY ARTERY DISEASE 12/28/2006   Myoview 8/21: EF 73, normal perfusion, low risk    Diabetes  mellitus type II, controlled ([place]) 12/28/2006   Actos '30mg'$ , insulin 70/30 20 units BID, amaryl '1mg'$  previously a1c <8. Worsened due to cold over 5 weeks around 05/2014 eating poorly and eating sugary syrups.   Stomach irritation on metformin.  Lab Results  Component Value Date   HGBA1C 8.7* 06/05/2014        DIABETES MELLITUS, TYPE II 12/28/2006   Duodenal ulcer    Erosive gastritis    GERD (gastroesophageal reflux disease)    GOUT 12/28/2006   Hemorrhoids    Hiatal hernia    HYPERLIPIDEMIA 12/28/2006   HYPERTENSION 12/28/2006   LATERAL EPICONDYLITIS, RIGHT 12/11/2009   Myocardial infarction ([place])    23 yrs ago per pt   Raynaud's disease    RENAL FAILURE, CHRONIC 09/15/2008   Tubular adenoma of colon 11/2010    Past Surgical History:  Procedure Laterality Date   ABDOMINAL AORTOGRAM W/LOWER EXTREMITY N/A 02/10/2020   Procedure: ABDOMINAL AORTOGRAM W/LOWER EXTREMITY;  Surgeon: Elam Dutch, MD;  Location: [facility];  Service: Cardiovascular;  Laterality: N/A;   CATARACT EXTRACTION Left 06/21/2020   COLONOSCOPY     CORONARY ARTERY BYPASS GRAFT     ENDARTERECTOMY FEMORAL Left 02/27/2020   Procedure: LEFT COMMON FEMORAL ENDARTERECTOMY;  Surgeon: Elam Dutch, MD;  Location: [facility];  Service: Vascular;  Laterality: Left;   FEMORAL-POPLITEAL BYPASS GRAFT Left 02/27/2020  Procedure: LEFT FEMORAL-ABOVE KNEE POPLITEAL BYPASS WITH NON REVERSED GREATER SAPHENOUS VEIN;  Surgeon: Elam Dutch, MD;  Location[facility] OR;  Service: Vascular;  Laterality: Left;   FEMORAL-POPLITEAL BYPASS GRAFT Right 04/23/2020   Procedure: BYPASS GRAFT FEMORAL- Above Knee POPLITEAL ARTERY RIGHT Using Right Greater Saphenous Vein;  Surgeon: Elam Dutch, MD;  Location: [facility] OR;  Service: Vascular;  Laterality: Right;   KNEE ARTHROSCOPY  2012   UPPER GASTROINTESTINAL ENDOSCOPY      Social History   Socioeconomic History   Marital status: Married    Spouse name: Not on file   Number of children: 2    Years of education: Not on file   Highest education level: Not on file  Occupational History   Occupation: retired  Tobacco Use   Smoking status: Former    Types: Cigarettes    Quit date: 07/09/1968    Years since quitting: 52.6   Smokeless tobacco: Former    Types: Chew    Quit date: 07/09/1988   Tobacco comments:    minimal use x 10 years   Vaping Use   Vaping Use: Never used  Substance and Sexual Activity   Alcohol use: No    Comment: last was 6 months   Drug use: No   Sexual activity: Not on file  Other Topics Concern   Not on file  Social History Narrative   Married 1983. 2 sons. No grandkids yet.       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       Hobbies: volunteer work, watch sports   Social Determinants of Radio broadcast assistant Strain: Not on file  Food Insecurity: Not on file  Transportation Needs: Not on file  Physical Activity: Not on file  Stress: Not on file  Social Connections: Not on file  Intimate Partner Violence: Not on file    Family History  Problem Relation Age of Onset   Diabetes Mother    Hypertension Mother    Stroke Father    Colon cancer Neg Hx    Esophageal cancer Neg Hx    Rectal cancer Neg Hx    Stomach cancer Neg Hx     Current Outpatient Medications  Medication Sig Dispense Refill   aspirin EC 81 MG tablet Take 81 mg by mouth daily. Swallow whole.     atorvastatin (LIPITOR) 40 MG tablet Take 1 tablet by mouth once daily 90 tablet 0   azelastine (ASTELIN) 0.1 % nasal spray Place 1 spray into both nostrils daily as needed for rhinitis. Use in each nostril as directed     chlorthalidone (HYGROTON) 25 MG tablet Take 1 tablet by mouth once daily 90 tablet 0   cholecalciferol (VITAMIN D3) 25 MCG (1000 UNIT) tablet Take 1,000 Units by mouth daily.     colchicine 0.6 MG tablet Take 0.6 mg by mouth daily as needed (for gout).     Continuous Blood Gluc Receiver  (FREESTYLE LIBRE 14 DAY READER) DEVI 2 each by Does not apply route daily. 2 each 11   Continuous Blood Gluc Sensor (FREESTYLE LIBRE 14 DAY SENSOR) MISC 2 each by Does not apply route daily. 2 each 11   cyclobenzaprine (FLEXERIL) 5 MG tablet TAKE ONE TABLET BY MOUTH TWICE DAILY AS NEEDED FOR MUSCLE SPASM (Patient taking differently: Take 5 mg by mouth 2 (two) times daily as needed for muscle spasms.) 30 tablet 0   fexofenadine (ALLEGRA) 180  MG tablet Take 180 mg by mouth daily.     fluticasone (FLONASE) 50 MCG/ACT nasal spray USE 2 SPRAY(S) IN EACH NOSTRIL ONCE DAILY AS NEEDED (FOR  ALLERGIES  OR  RHINITIS) 16 g 0   ketorolac (ACULAR) 0.4 % SOLN Place 1 drop into the left eye 4 (four) times daily.     metoprolol succinate (TOPROL-XL) 100 MG 24 hr tablet TAKE 1 TABLET EVERY DAY WITH OR IMMMEDIATELY FOLLOWING A MEAL 90 tablet 1   NIFEdipine (PROCARDIA-XL) 30 MG (OSM) 24 hr tablet Take 30 mg by mouth 2 (two) times daily.     NOVOLIN 70/30 RELION (70-30) 100 UNIT/ML injection INJECT 22 UNITS INTO THE SKIN IN THE MORNING AND 20 UNITS IN THE EVENING (Patient taking differently: Inject 20-22 Units into the skin See admin instructions. Inject 22 units subcutaneously in the morning & inject 20 units subcutaneously in the evening) 10 mL 0   oxymetazoline (AFRIN) 0.05 % nasal spray Place 1 spray into both nostrils 2 (two) times daily as needed for congestion.     pioglitazone (ACTOS) 30 MG tablet Take 1 tablet by mouth once daily 90 tablet 0   probenecid (BENEMID) 500 MG tablet Take 1 tablet (500 mg total) by mouth 2 (two) times daily. For first month take half tablet twice daily then full tablet twice daily ongoing 60 tablet 5   No current facility-administered medications for this visit.    Allergies  Allergen Reactions   Penicillins Hives   Tetracycline Hcl Hives   Allopurinol Itching     REVIEW OF SYSTEMS:  '[X]'$  denotes positive finding, '[ ]'$  denotes negative finding Cardiac  Comments:  Chest pain  or chest pressure:    Shortness of breath upon exertion:    Short of breath when lying flat:    Irregular heart rhythm:        Vascular    Pain in calf, thigh, or hip brought on by ambulation:    Pain in feet at night that wakes you up from your sleep:     Blood clot in your veins:    Leg swelling:  X       Pulmonary    Oxygen at home:    Productive cough:     Wheezing:         Neurologic    Sudden weakness in arms or legs:     Sudden numbness in arms or legs:     Sudden onset of difficulty speaking or slurred speech:    Temporary loss of vision in one eye:     Problems with dizziness:         Gastrointestinal    Blood in stool:     Vomited blood:         Genitourinary    Burning when urinating:     Blood in urine:        Psychiatric    Major depression:         Hematologic    Bleeding problems:    Problems with blood clotting too easily:        Skin    Rashes or ulcers:        Constitutional    Fever or chills:      PHYSICAL EXAMINATION:  Vitals:   02/14/21 1122  BP: (!) 145/62  Pulse: (!) 55  Resp: 20  Temp: 97.9 F (36.6 C)  SpO2: 95%  Weight: 195 lb (88.5 kg)  Height: 6' (1.829 m)    General:  WDWN in NAD; vital signs documented above Gait: Normal HENT: WNL, normocephalic Pulmonary: normal non-labored breathing , without Rales, rhonchi,  wheezing Cardiac: regular HR, without  Murmurs without carotid bruit Abdomen: soft, NT, no masses Vascular Exam/Pulses:  Right Left  Radial 2+ (normal) 2+ (normal)  Femoral 2+ (normal) 2+ (normal)  Popliteal Not palpable not palpable  DP 2+ (normal) 2+ (normal)  PT Not palpable Not palpable   Extremities: without ischemic changes, without Gangrene , without cellulitis; without open wounds; fullness in right groin. No pulsatility. No overlying skin changes. Non tender Musculoskeletal: no muscle wasting or atrophy  Neurologic: A&O X 3;  No focal weakness or paresthesias are detected Psychiatric:  The pt  has Normal affect.   Non-Invasive Vascular Imaging:   +-------+-----------+-----------+------------+------------+ ABI/TBIToday's ABIToday's TBIPrevious ABIPrevious TBI +-------+-----------+-----------+------------+------------+ Right  1.05       0.52       1.02        0.51         +-------+-----------+-----------+------------+------------+ Left   0.96       0.68       0.94        0.56         +-------+-----------+-----------+------------+------------+   Right Graft #1: Femoral to above knee popliteal bypass  +------------------+--------+-------------+--------+--------------+                    PSV cm/sStenosis     WaveformComments        +------------------+--------+-------------+--------+--------------+  Inflow            126                  biphasic                +------------------+--------+-------------+--------+--------------+  Prox Anastomosis  225                  biphasic                +------------------+--------+-------------+--------+--------------+  Proximal Graft    128                  biphasic                +------------------+--------+-------------+--------+--------------+  Mid Graft         343     >70% stenosisbiphasicarea of valves  +------------------+--------+-------------+--------+--------------+  Distal Graft      139                  biphasic                +------------------+--------+-------------+--------+--------------+  Distal Anastomosis148                  biphasic                +------------------+--------+-------------+--------+--------------+  Outflow           159                  biphasic                +------------------+--------+-------------+--------+--------------+    Left Graft #1: Femoral to above knee popliteal bypass  +--------------------+--------+---------------+--------+--------+                      PSV cm/sStenosis       WaveformComments   +--------------------+--------+---------------+--------+--------+  Inflow              147  biphasic          +--------------------+--------+---------------+--------+--------+  Proximal Anastomosis112                    biphasic          +--------------------+--------+---------------+--------+--------+  Proximal Graft      158                    biphasic          +--------------------+--------+---------------+--------+--------+  Mid Graft           191     50-70% stenosisbiphasic          +--------------------+--------+---------------+--------+--------+  Distal Graft        237     50-70% stenosisbiphasic          +--------------------+--------+---------------+--------+--------+  Distal Anastomosis  217     50-70% stenosisbiphasic          +--------------------+--------+---------------+--------+--------+  Outflow             132                    biphasic          +--------------------+--------+---------------+--------+--------+   Summary:  Right: Patent femoral to above knee popliteal bypass graft with increased velocity in the >70% range at a valve mid graft area. No plaque visualized   Left: Patent femoral to above knee popliteal bypass graft with increased velocity in the 50 - 79% range by velocity only. No plaque visualized.   ASSESSMENT/PLAN:: 80 y.o. adult here for follow up for peripheral artery disease. He is s/p right femoral to above knee popliteal  reversed saphenous vein bypass by Dr. Oneida Alar in October of 2021. He also has undergone left femoral endarterectomy and left femoral to above knee popliteal vein bypass in August of 2021 by Dr. Oneida Alar. I think he may have some claudication symptoms but these are not presently disabling. He also suffers from a lot of neuropathy symptoms which are more bothersome to him - BLE ABI's are essentially unchanged from prior study - Duplex today shows some mid graft stenosis  bilaterally. I do not feel that presently based on his symptoms and duplex findings that it warrants urgent intervention - Continue Aspirin and statin - He understands that if he has new or worsening symptoms he will for earlier follow up - Will have him return for closer interval follow up with repeat BLE bypass graft duplex and ABI's in 3 months   Karoline Caldwell, PA-C [facility] MD:   Scot Dock

## 2021-02-15 ENCOUNTER — Other Ambulatory Visit: Payer: Self-pay

## 2021-02-15 DIAGNOSIS — I739 Peripheral vascular disease, unspecified: Secondary | ICD-10-CM

## 2021-02-27 ENCOUNTER — Encounter: Payer: Self-pay | Admitting: Gastroenterology

## 2021-03-04 ENCOUNTER — Other Ambulatory Visit: Payer: Self-pay | Admitting: Family Medicine

## 2021-03-06 NOTE — Progress Notes (Signed)
 Phone [phone] In person visit   Subjective:   Matthew [name] is a 80 y.o. year old very pleasant adult patient who presents for/with See problem oriented charting Chief Complaint  Patient presents with   Diabetes   This visit occurred during the SARS-CoV-2 public health emergency.  Safety protocols were in place, including screening questions prior to the visit, additional usage of staff PPE, and extensive cleaning of exam room while observing appropriate contact time as indicated for disinfecting solutions.   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/2006    Priority: High    Coronary artery disease s/p CABG 1997 12/28/2006    Priority: High   Cervical arthritis (HCC) 10/25/2014    Priority: Medium   Erectile dysfunction 08/13/2010    Priority: Medium   CKD (chronic kidney disease), stage III (HCC) 09/15/2008    Priority: Medium   Hyperlipidemia associated with type 2 diabetes mellitus (HCC) 12/28/2006    Priority: Medium   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 Swallow whole.     atorvastatin (LIPITOR) 40 MG tablet Take 1 tablet by mouth once daily 90 tablet 0   azelastine  (ASTELIN) 0.1 % nasal spray Place 1 spray into both nostrils daily as needed for rhinitis. Use in each nostril as directed     chlorthalidone (HYGROTON) 25 MG tablet Take 1 tablet by mouth once daily 90 tablet 0   cholecalciferol (VITAMIN D3) 25 MCG (1000 UNIT) tablet Take 1,000 Units by mouth daily.     colchicine 0.6 MG tablet Take 0.6 mg by mouth daily as needed (for gout).     Continuous Blood Gluc Receiver (FREESTYLE LIBRE 14 DAY READER) DEVI 2 each by Does not apply route daily. 2 each 11   Continuous Blood Gluc Sensor (FREESTYLE LIBRE 14 DAY SENSOR) MISC 2 each by Does not apply route daily. 2 each 11   cyclobenzaprine (FLEXERIL) 5 MG tablet TAKE ONE TABLET BY MOUTH TWICE DAILY AS NEEDED FOR MUSCLE SPASM (Patient taking differently: Take 5 mg by mouth 2 (two) times daily as needed for muscle spasms.) 30 tablet 0   fexofenadine (ALLEGRA) 180 MG tablet Take 180 mg by mouth daily.     fluticasone (FLONASE) 50 MCG/ACT nasal spray USE 2 SPRAY(S) IN EACH NOSTRIL ONCE DAILY AS NEEDED (FOR  ALLERGIES  OR  RHINITIS) 16 g 0   ketorolac (ACULAR) 0.4 % SOLN Place 1 drop into the left eye 4 (four) times daily.     metoprolol succinate (TOPROL-XL) 100 MG 24 hr tablet TAKE 1 TABLET EVERY DAY WITH OR IMMMEDIATELY FOLLOWING A MEAL 90 tablet 1   NIFEdipine (PROCARDIA-XL) 30 MG (OSM) 24 hr tablet Take   30 mg by mouth 2 (two) times daily.     NOVOLIN 70/30 RELION (70-30) 100 UNIT/ML injection INJECT 22 UNITS INTO THE SKIN IN THE MORNING AND 20 UNITS IN THE EVENING (Patient taking differently: Inject 20-22 Units into the skin See admin instructions. Inject 22 units subcutaneously in the morning & inject 20 units subcutaneously in the evening) 10 mL 0   oxymetazoline (AFRIN) 0.05 % nasal spray Place 1 spray into both nostrils 2 (two) times daily as needed for congestion.     pioglitazone (ACTOS) 30 MG tablet Take 1 tablet by mouth once daily 90 tablet 0   No current facility-administered medications for this visit.      Objective:  BP 132/72 Comment: retake  Pulse (!) 57   Temp 98.3 F (36.8 C)   Resp 16   Wt 197 lb 9.6 oz (89.6 kg)   SpO2 99%   BMI 26.80 kg/m  Gen: NAD, resting comfortably CV: RRR no murmurs rubs or gallops Lungs: CTAB no crackles, wheeze, rhonchi Abdomen: soft/nontender/nondistended/normal bowel sounds. No rebound or guarding.  Ext: trace edema under compression Skin: warm, dry Neuro: grossly normal, moves all extremities     Assessment and Plan   #social update- wife with multiple myeloma diagnosed 2021- doing well despite prior diagnosis   #PAD #CAD- no chest pain or shortness of breath reported today #hyperlipidemia S: Medication: Atorvastatin 40Mg daily. Aspirin 59m daily  -For PAD- getting some claudication now- scheduled in November for follow up. Prior left femoral to above knee popliteal bypass August 30th, 2021 and then with right bypass graft femoral above knee popliteal artery that used right greater saphenous vein with Dr. FOneida Alar -neuropathy likely contributing ot pain which can confuse picture- not overly disabling at this tim Lab Results  Component Value Date   CHOL 98 04/24/2020   HDL 33 (L) 04/24/2020   LDLCALC 53 04/24/2020   LDLDIRECT 68.0 07/27/2019   TRIG 60 04/24/2020   CHOLHDL 3.0 04/24/2020   A/P: PAD slight worsening from my last visit but has seen vascular and they do nog suggest changes at this time-continue current medication. Discussed possible eliquis  CAD remains asymptomatic-continue current medication  For hyperlipidemia LDL has been at goal under 70-not quite due for full lipid panel today but we will check direct LDL  # Diabetes S: Medication:Actos 30 Mg daily, Novolin 70/30  22 units in AM and 20 units in PM, - Discontinued Glimipride 179mdaily  as was getting some lows last visit. No significant edema on actos- does use compression CBGs- no #s below 70- does get into the 70s at times (feels poorly in this range). Most of  his 7057save occurred in Am- occasionally in the AM Exercise and diet- exercise hard with gout. Working on [employer] Results  Component Value Date   HGBA1C 6.4 11/05/2020   HGBA1C 8.1 (H) 02/24/2020   HGBA1C 7.1 (A) 11/04/2019   A/P: I would still prefer to keep him out of 70s- peraps overcontrolled- reduce to 21 units of insulin in the morning and 19 units in the evening. If you have any more #s under 80 let meknow and I will adjust dose further. Continue actos for now -try for freestyle meter- with lows and longterm insulin would recommend this  #Gout S: on Colchicine 0.6 mg daily as needed -Trying Probenacid 250 Mg twice daily for a month then 500 Mg twice daily at last visit- patient reports had a bump on his chest with this  that resolved off of medicine. Still with some flare ups - itched on allopurinol now with PAD hold off on uloric  --aspirin may decreased efficacy of probenecid but still thought we should trial Lab Results  Component Value Date   LABURIC 7.9 (H) 11/05/2020  A/P:Poor control last visit-still poor control as didn't tolerate probenacid or allopurinol- not great candidate for uloric with PAD - hold off on uric acid today- focus on low uric acid diet  #hypertension S: medication: Chlorthalidone 25 Mg daily, Metoprolol 125m daily XL - - was taken off lisinopril by Dr. FOneida Alar(unclear why) Home readings #s: high 120s or low 130s/70s BP Readings from Last 3 Encounters:  03/11/21 132/72  02/14/21 (!) 145/62  11/05/20 (!) 134/54  A/P: Controlled. Continue current medications.   #Chronic kidney disease stage III S: GFR is typically in the 40s range -Patient knows to avoid NSAIDs   A/P: hopefully stable- update cmp today   #HM- flu shot today, consider omicron booster  Recommended follow up: Return in about 6 months (around 09/08/2021) for follow-up or sooner if needed.. Future Appointments  Date Time Provider [place] 03/20/2021  1:15 PM [place],  GClent Demark MD RDE-RDE None  05/13/2021 12:00 PM [facility] 3 - EM [facility]  05/13/2021  1:00 PM [facility] 3 - EM [facility]  05/13/2021  2:15 PM [facility] PA [facility]   Lab/Order associations:   ICD-10-CM   1. Hypertension associated with diabetes ([place]  E11.59 Comprehensive metabolic panel   IQ65.7Hemoglobin A1c    2. Hyperlipidemia associated with type 2 diabetes mellitus (HCC)  E11.69 Comprehensive metabolic panel   EQ46.9LDL cholesterol, direct    3. Type 2 diabetes mellitus with stage 3 chronic kidney disease, with long-term current use of insulin, unspecified whether stage 3a or 3b CKD (HCC)  E11.22 Comprehensive metabolic panel   NG29.52Hemoglobin A1c   Z79.4     4. Stage 3 chronic kidney disease, unspecified whether stage 3a or 3b CKD (HCC)  N18.30     5. Gout with tophi  [id number]     6. PAD (peripheral artery disease) (HCC)-with claudication  I73.9       No orders of the defined types were placed in this encounter.  I,Jada Bradford,acting as a scribe for SGarret Reddish MD.,have documented all relevant documentation on the behalf of SGarret Reddish MD,as directed by  SGarret Reddish MD while in the presence of SGarret Reddish MD.  I, SGarret Reddish MD, have reviewed all documentation for this visit. The documentation on 03/11/21 for the exam, diagnosis, procedures, and orders are all accurate and complete.   Return precautions advised.  SGarret Reddish MD

## 2021-03-11 ENCOUNTER — Encounter: Payer: Self-pay | Admitting: Family Medicine

## 2021-03-11 ENCOUNTER — Ambulatory Visit (INDEPENDENT_AMBULATORY_CARE_PROVIDER_SITE_OTHER): Payer: Medicare Other | Admitting: Family Medicine

## 2021-03-11 ENCOUNTER — Other Ambulatory Visit: Payer: Self-pay

## 2021-03-11 ENCOUNTER — Encounter (INDEPENDENT_AMBULATORY_CARE_PROVIDER_SITE_OTHER): Payer: Self-pay

## 2021-03-11 VITALS — BP 132/72 | HR 57 | Temp 98.3°F | Resp 16 | Wt 197.6 lb

## 2021-03-11 DIAGNOSIS — H0102A Squamous blepharitis right eye, upper and lower eyelids: Secondary | ICD-10-CM | POA: Diagnosis not present

## 2021-03-11 DIAGNOSIS — N183 Chronic kidney disease, stage 3 unspecified: Secondary | ICD-10-CM | POA: Diagnosis not present

## 2021-03-11 DIAGNOSIS — E1122 Type 2 diabetes mellitus with diabetic chronic kidney disease: Secondary | ICD-10-CM

## 2021-03-11 DIAGNOSIS — H43823 Vitreomacular adhesion, bilateral: Secondary | ICD-10-CM | POA: Diagnosis not present

## 2021-03-11 DIAGNOSIS — I152 Hypertension secondary to endocrine disorders: Secondary | ICD-10-CM | POA: Diagnosis not present

## 2021-03-11 DIAGNOSIS — Z961 Presence of intraocular lens: Secondary | ICD-10-CM | POA: Diagnosis not present

## 2021-03-11 DIAGNOSIS — H0102B Squamous blepharitis left eye, upper and lower eyelids: Secondary | ICD-10-CM | POA: Diagnosis not present

## 2021-03-11 DIAGNOSIS — E1169 Type 2 diabetes mellitus with other specified complication: Secondary | ICD-10-CM

## 2021-03-11 DIAGNOSIS — M1A9XX1 Chronic gout, unspecified, with tophus (tophi): Secondary | ICD-10-CM

## 2021-03-11 DIAGNOSIS — I739 Peripheral vascular disease, unspecified: Secondary | ICD-10-CM | POA: Diagnosis not present

## 2021-03-11 DIAGNOSIS — E785 Hyperlipidemia, unspecified: Secondary | ICD-10-CM

## 2021-03-11 DIAGNOSIS — Z23 Encounter for immunization: Secondary | ICD-10-CM | POA: Diagnosis not present

## 2021-03-11 DIAGNOSIS — Z794 Long term (current) use of insulin: Secondary | ICD-10-CM

## 2021-03-11 DIAGNOSIS — E1159 Type 2 diabetes mellitus with other circulatory complications: Secondary | ICD-10-CM | POA: Diagnosis not present

## 2021-03-11 DIAGNOSIS — I2510 Atherosclerotic heart disease of native coronary artery without angina pectoris: Secondary | ICD-10-CM

## 2021-03-11 LAB — COMPREHENSIVE METABOLIC PANEL
ALT: 11 U/L (ref 0–53)
AST: 17 U/L (ref 0–37)
Albumin: 3.9 g/dL (ref 3.5–5.2)
Alkaline Phosphatase: 55 U/L (ref 39–117)
BUN: 29 mg/dL — ABNORMAL HIGH (ref 6–23)
CO2: 30 mEq/L (ref 19–32)
Calcium: 9.6 mg/dL (ref 8.4–10.5)
Chloride: 104 mEq/L (ref 96–112)
Creatinine, Ser: 1.6 mg/dL — ABNORMAL HIGH (ref 0.40–1.50)
GFR: 41.01 mL/min — ABNORMAL LOW (ref >60.00)
Glucose, Bld: 69 mg/dL — ABNORMAL LOW (ref 70–99)
Potassium: 4 mEq/L (ref 3.5–5.1)
Sodium: 140 mEq/L (ref 135–145)
Total Bilirubin: 0.5 mg/dL (ref 0.2–1.2)
Total Protein: 8.1 g/dL (ref 6.0–8.3)

## 2021-03-11 LAB — HEMOGLOBIN A1C: Hgb A1c MFr Bld: 7.2 % — ABNORMAL HIGH (ref 4.6–6.5)

## 2021-03-11 LAB — LDL CHOLESTEROL, DIRECT: Direct LDL: 69 mg/dL

## 2021-03-11 MED ORDER — FREESTYLE LIBRE 14 DAY SENSOR MISC: 2.0000 | Freq: Every day | 11 refills | Status: DC

## 2021-03-11 MED ORDER — FREESTYLE LIBRE 14 DAY READER DEVI: 2.0000 | Freq: Every day | 11 refills | Status: DC

## 2021-03-11 NOTE — Patient Instructions (Addendum)
Health Maintenance Due  Topic Date Due   INFLUENZA VACCINE   -  today 01/28/2021   Please consider new Covid Omicron booster vaccination in October.  Please take 21 units of Insulin in the morning and 19 units in the evening.if you get any #s under 80 with this reduction please let me know.   Please stop by lab before you go If you have mychart- we will send your results within 3 business days of [place] receiving them.  If you do not have mychart- we will call you about results within 5 business days of [place] receiving them.  *please also note that you will see labs on mychart as soon as they post. I will later go in and write notes on them- will say "notes from Dr. Yong Channel"  Recommended follow up: Return in about 6 months (around 09/08/2021) for follow-up or sooner if needed.Marland Kitchen

## 2021-03-11 NOTE — Addendum Note (Signed)
Addended by: Randolm Idol A on: 03/11/2021 11:03 AM   Modules accepted: Orders

## 2021-03-13 ENCOUNTER — Telehealth: Payer: Self-pay | Admitting: Cardiovascular Disease

## 2021-03-13 NOTE — Telephone Encounter (Signed)
Patient called wanting to talk with Dr. Burt Knack or his nurse in regards to appt. Told patient he would have to be put on the waitlist due to Dr. Burt Knack not b Patient still insisted on being called back

## 2021-03-13 NOTE — Telephone Encounter (Signed)
Spoke to the patient and placed him on the waitlist for his yearly follow up.  Patient verbalized understanding.

## 2021-03-18 ENCOUNTER — Telehealth: Payer: Self-pay

## 2021-03-18 NOTE — Telephone Encounter (Signed)
Pt called stating that the Glucose Freestyle Libre Device.  He stated that the device does not go through the pahrmacy. He stated that it should come straight from the company without going through his pharmacy. He would like a call back to see what is going on with the device. Please Advise.

## 2021-03-19 NOTE — Telephone Encounter (Signed)
Have you filled out any of his paper work for this?

## 2021-03-20 ENCOUNTER — Other Ambulatory Visit: Payer: Self-pay

## 2021-03-20 ENCOUNTER — Other Ambulatory Visit: Payer: Self-pay | Admitting: Family Medicine

## 2021-03-20 ENCOUNTER — Ambulatory Visit (INDEPENDENT_AMBULATORY_CARE_PROVIDER_SITE_OTHER): Payer: Medicare Other | Admitting: Ophthalmology

## 2021-03-20 ENCOUNTER — Encounter (INDEPENDENT_AMBULATORY_CARE_PROVIDER_SITE_OTHER): Payer: Self-pay | Admitting: Ophthalmology

## 2021-03-20 DIAGNOSIS — H35072 Retinal telangiectasis, left eye: Secondary | ICD-10-CM | POA: Diagnosis not present

## 2021-03-20 DIAGNOSIS — H35352 Cystoid macular degeneration, left eye: Secondary | ICD-10-CM

## 2021-03-20 DIAGNOSIS — H43822 Vitreomacular adhesion, left eye: Secondary | ICD-10-CM

## 2021-03-20 DIAGNOSIS — I2510 Atherosclerotic heart disease of native coronary artery without angina pectoris: Secondary | ICD-10-CM | POA: Diagnosis not present

## 2021-03-20 HISTORY — DX: Cystoid macular degeneration, left eye: H35.352

## 2021-03-20 NOTE — Assessment & Plan Note (Signed)
CME OS has increased and continued on ketorolac and, restarted recently on 03-11-2021 after visit with Dr. Richardson Chiquito  The cluster of microaneurysms on the temporal aspect of the fovea not classic for typical CME related to pseudophakia or ongoing inflammation of the eye.  This is because microaneurysms usually form after only chronic the development of retinal vascular disease or chronic inflammation in the vitreous cavity.  Moreover the focal nature does speak that this is occurrence in the watershed area may in fact be a form of MAC-TEL.

## 2021-03-20 NOTE — Assessment & Plan Note (Signed)
New cluster of microaneurysms on the temporal aspect of the macula.  Patient does have a review of systems suggestive of sleep apnea with awakening with nocturia at least twice nightly and also napping on most days in the afternoon.  He however he has no reported symptoms of snoring from his wife.  I have asked the patient to have his wife report and update this issue.  If in fact that is going on we may suggest having a sleep study performed to look for nightly macular hypoxia that could arise from untreated or undiagnosed sleep apnea

## 2021-03-20 NOTE — Progress Notes (Signed)
03/20/2021     CHIEF COMPLAINT Patient presents for  Chief Complaint  Patient presents with   Retina Follow Up      HISTORY OF PRESENT ILLNESS: Matthew [name] is a 80 y.o. adult who presents to the clinic today for:   HPI     Retina Follow Up   Patient presents with  Other.  In both eyes.  This started 3 months ago.  Duration of 3 months.        Comments   3 month f/u OU with OCT  Pt states he has had cataract surgery in the right eye since he was here last. Pt states Dr. Midge Aver told him he still has inflammation in the left eye. Pt states his left eye continues to feel, "tight." Pt denies any new flashes or floaters. Pt denies any eye pain.   Type 2 diabetic. 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 A1c: 6.4  Eye Meds: Ketorolac QID OS per Dr. Katy Fitch      Last edited by Reather Littler, COA on 03/20/2021  1:32 PM.      Referring physician: Warden Fillers, MD [address] [place],  [place] [address]  HISTORICAL INFORMATION:   Selected notes from the MEDICAL RECORD NUMBER    Lab Results  Component Value Date   HGBA1C 7.2 (H) 03/11/2021     CURRENT MEDICATIONS: Current Outpatient Medications (Ophthalmic Drugs)  Medication Sig   ketorolac (ACULAR) 0.4 % SOLN Place 1 drop into the left eye 4 (four) times daily.   No current facility-administered medications for this visit. (Ophthalmic Drugs)   Current Outpatient Medications (Other)  Medication Sig   aspirin EC 81 MG tablet Take 81 mg by mouth daily. Swallow whole.   atorvastatin (LIPITOR) 40 MG tablet Take 1 tablet by mouth once daily   azelastine (ASTELIN) 0.1 % nasal spray Place 1 spray into both nostrils daily as needed for rhinitis. Use in each nostril as directed   chlorthalidone (HYGROTON) 25 MG tablet Take 1 tablet by mouth once daily   cholecalciferol (VITAMIN D3) 25 MCG (1000 UNIT) tablet Take 1,000 Units by mouth daily.   colchicine 0.6 MG tablet Take 0.6 mg by mouth daily as needed (for  gout).   Continuous Blood Gluc Receiver (FREESTYLE LIBRE 14 DAY READER) DEVI 2 each by Does not apply route daily.   Continuous Blood Gluc Sensor (FREESTYLE LIBRE 14 DAY SENSOR) MISC 2 each by Does not apply route daily.   cyclobenzaprine (FLEXERIL) 5 MG tablet TAKE ONE TABLET BY MOUTH TWICE DAILY AS NEEDED FOR MUSCLE SPASM (Patient taking differently: Take 5 mg by mouth 2 (two) times daily as needed for muscle spasms.)   fexofenadine (ALLEGRA) 180 MG tablet Take 180 mg by mouth daily.   fluticasone (FLONASE) 50 MCG/ACT nasal spray USE 2 SPRAY(S) IN EACH NOSTRIL ONCE DAILY AS NEEDED (FOR  ALLERGIES  OR  RHINITIS)   metoprolol succinate (TOPROL-XL) 100 MG 24 hr tablet TAKE 1 TABLET EVERY DAY WITH OR IMMMEDIATELY FOLLOWING A MEAL   NIFEdipine (PROCARDIA-XL) 30 MG (OSM) 24 hr tablet Take 30 mg by mouth 2 (two) times daily.   NOVOLIN 70/30 RELION (70-30) 100 UNIT/ML injection INJECT 22 UNITS INTO THE SKIN IN THE MORNING AND 20 UNITS IN THE EVENING (Patient taking differently: Inject 20-22 Units into the skin See admin instructions. Inject 22 units subcutaneously in the morning & inject 20 units subcutaneously in the evening)   oxymetazoline (AFRIN) 0.05 % nasal spray Place  1 spray into both nostrils 2 (two) times daily as needed for congestion.   pioglitazone (ACTOS) 30 MG tablet Take 1 tablet by mouth once daily   No current facility-administered medications for this visit. (Other)      REVIEW OF SYSTEMS:    ALLERGIES Allergies  Allergen Reactions   Penicillins Hives   Probenecid     Developed lump on chest- went away when he stopped the medicine   Tetracycline Hcl Hives   Allopurinol Itching    PAST MEDICAL HISTORY Past Medical History:  Diagnosis Date   CLAUDICATION 05/14/2007   CORONARY ARTERY DISEASE 12/28/2006   Myoview 8/21: EF 73, normal perfusion, low risk    Diabetes mellitus type II, controlled ([place]) 12/28/2006   Actos 71m, insulin 70/30 20 units BID, amaryl 121m previously a1c <8. Worsened due to cold over 5 weeks around 05/2014 eating poorly and eating sugary syrups.   Stomach irritation on metformin.  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   Vitrectomy, no membrane peel, 12-05-2020   Past Surgical History:  Procedure Laterality Date   ABDOMINAL AORTOGRAM W/LOWER EXTREMITY N/A 02/10/2020   Procedure: ABDOMINAL AORTOGRAM W/LOWER EXTREMITY;  Surgeon: FiElam DutchMD;  [facility];  Service: Cardiovascular;  Laterality: N/A;   CATARACT EXTRACTION Left 06/21/2020   COLONOSCOPY     CORONARY ARTERY BYPASS GRAFT     ENDARTERECTOMY FEMORAL Left 02/27/2020   Procedure: LEFT COMMON FEMORAL ENDARTERECTOMY;  Surgeon: FiElam DutchMD;  [facility];  Service: Vascular;  Laterality: Left;   FEMORAL-POPLITEAL BYPASS GRAFT Left 02/27/2020   Procedure: LEFT FEMORAL-ABOVE KNEE POPLITEAL BYPASS WITH NON REVERSED GREATER SAPHENOUS VEIN;  Surgeon: FiElam DutchMD;  [facility];  Laterality: Left;   FEMORAL-POPLITEAL BYPASS GRAFT Right 04/23/2020   Procedure: BYPASS GRAFT FEMORAL- Above Knee POPLITEAL ARTERY RIGHT Using Right Greater Saphenous Vein;  Surgeon: FiElam DutchMD;  [facility];  Service: Vascular;  Laterality: Right;   KNEE ARTHROSCOPY  2012   UPPER GASTROINTESTINAL ENDOSCOPY      FAMILY HISTORY Family History  Problem Relation Age of Onset   Diabetes Mother    Hypertension Mother    Stroke Father    Colon  cancer Neg Hx    Esophageal cancer Neg Hx    Rectal cancer Neg Hx    Stomach cancer Neg Hx     SOCIAL HISTORY Social History   Tobacco Use   Smoking status: Former    Types: Cigarettes    Quit date: 07/09/1968    Years since quitting: 52.7   Smokeless tobacco: Former    Types: Chew    Quit date: 07/09/1988   Tobacco comments:    minimal use x 10 years   Vaping Use   Vaping Use: Never used  Substance Use Topics   Alcohol use: No    Comment: last was 6  months   Drug use: No         OPHTHALMIC EXAM:  Base Eye Exam     Visual Acuity (ETDRS)       Right Left   Dist [place] 20/20 -1 20/20 -1         Tonometry (Tonopen, 1:39 PM)       Right Left   Pressure 11 12         Pupils       Pupils Dark Light Shape React APD   Right PERRL 5 4 Round Brisk None   Left PERRL 5 4 Round Brisk None         Visual Fields (Counting fingers)       Left Right    Full Full         Extraocular Movement       Right Left    Full, Ortho Full, Ortho         Neuro/Psych     Oriented x3: Yes   Mood/Affect: Normal         Dilation     Both eyes: 1.0% Mydriacyl, 2.5% Phenylephrine @ 1:39 PM           Slit Lamp and Fundus Exam     External Exam       Right Left   External Normal Normal         Slit Lamp Exam       Right Left   Lids/Lashes Normal Normal   Conjunctiva/Sclera White and quiet White and quiet   Cornea Clear Clear   Anterior Chamber Deep and quiet Deep and quiet   Iris Round and reactive Round and reactive   Lens 3+ Nuclear sclerosis Centered posterior chamber intraocular lens   Anterior Vitreous Normal Normal         Fundus Exam       Right Left   Posterior Vitreous Normal Normal   Disc Normal Normal   C/D Ratio 0.55 0.5   Macula Microaneurysms, Pseudohole with pseudo cystoid change likely a serous elevation noted on OCT, no MAs seen Cluster of  Microaneurysms temporal portion of faz, no macular hole, negative Watzke sign    Vessels Normal Normal, no sign of retinal vein occlusion   Periphery Normal Normal            IMAGING AND PROCEDURES  Imaging and Procedures for 03/20/21  OCT, Retina - OU - Both Eyes       Right Eye Quality was good. Scan locations included subfoveal. Central Foveal Thickness: 321. Progression has been stable. Findings include abnormal foveal contour, vitreous traction.   Left Eye Quality was good. Scan locations included subfoveal. Central Foveal Thickness: 305. Progression has improved. Findings include abnormal foveal contour, cystoid macular edema.   Notes Vitreal macular traction with small serous subfoveal detachment OD OS, vastly improved 3 months post vitrectomy for VMT with inner and outer foveal distortion and vision loss..  Outer photoreceptor layer now normal without serous fluid.  Small regional noncentral involved CME on temporal edge of FAZ newly present.             ASSESSMENT/PLAN:  Cystoid macular edema of left eye CME OS has increased and continued on ketorolac and, restarted recently on 03-11-2021 after visit with Dr. Richardson Chiquito  The cluster of microaneurysms on the temporal aspect of the fovea not classic for typical CME related to pseudophakia or ongoing inflammation of the eye.  This is because microaneurysms usually form  after only chronic the development of retinal vascular disease or chronic inflammation in the vitreous cavity.  Moreover the focal nature does speak that this is occurrence in the watershed area may in fact be a form of MAC-TEL.    Type 2 macular telangiectasis, left New cluster of microaneurysms on the temporal aspect of the macula.  Patient does have a review of systems suggestive of sleep apnea with awakening with nocturia at least twice nightly and also napping on most days in the afternoon.  He however he has no reported symptoms of snoring from his wife.  I have asked the patient to have his wife report and update this  issue.  If in fact that is going on we may suggest having a sleep study performed to look for nightly macular hypoxia that could arise from untreated or undiagnosed sleep apnea     ICD-10-CM   1. Vitreomacular adhesion of left eye  H43.822 OCT, Retina - OU - Both Eyes    2. Cystoid macular edema of left eye  H35.352     3. Type 2 macular telangiectasis, left  H35.072       1.  OS with new onset CME not related to previous vitreal macular traction.  This does suggest the possibility of primary pseudophakic CME.  Have asked the patient not to compress mash or rub the eye.  Moreover the patient could have MAC-TEL and thus we were asked the patient to more carefully pay attention to whether or not he might snore because of other signs of suggestive of positive ROS of sleep apnea.  2.  OS however shall continue with use of topical ketorolac 4 times daily follow-up in 4 weeks possible fluorescein angiography left and right eye next  3.  Ophthalmic Meds Ordered this visit:  No orders of the defined types were placed in this encounter.      Return in about 4 weeks (around 04/17/2021) for DILATE OU, OPTOS FFA L/R, COLOR FP, OCT.  There are no Patient Instructions on file for this visit.   Explained the diagnoses, plan, and follow up with the patient and they expressed understanding.  Patient expressed understanding of the importance of proper follow up care.   Clent Demark Latice Waitman M.D. Diseases & Surgery of the Retina and Vitreous [facility]ina [facility] Di[facility] [place] 03/20/21     Abbreviations: M myopia (nearsighted); A astigmatism; H hyperopia (farsighted); P presbyopia; Mrx spectacle prescription;  CTL contact lenses; OD right eye; OS left eye; OU both eyes  XT exotropia; ET esotropia; PEK punctate epithelial keratitis; PEE punctate epithelial erosions; DES dry eye syndrome; MGD meibomian gland dysfunction; ATs artificial tears; PFAT's preservative free artificial tears; [place] nuclear sclerotic  cataract; PSC posterior subcapsular cataract; ERM epi-retinal membrane; PVD posterior vitreous detachment; RD retinal detachment; DM diabetes mellitus; DR diabetic retinopathy; NPDR non-proliferative diabetic retinopathy; PDR proliferative diabetic retinopathy; CSME clinically significant macular edema; DME diabetic macular edema; dbh dot blot hemorrhages; CWS cotton wool spot; POAG primary open angle glaucoma; C/D cup-to-disc ratio; HVF humphrey visual field; GVF goldmann visual field; OCT optical coherence tomography; IOP intraocular pressure; BRVO Branch retinal vein occlusion; CRVO central retinal vein occlusion; CRAO central retinal artery occlusion; BRAO branch retinal artery occlusion; RT retinal tear; SB scleral buckle; PPV pars plana vitrectomy; VH Vitreous hemorrhage; PRP panretinal laser photocoagulation; IVK intravitreal kenalog; VMT vitreomacular traction; MH Macular hole;  NVD neovascularization of the disc; NVE neovascularization elsewhere; AREDS age related eye disease study; ARMD age related macular degeneration;  POAG primary open angle glaucoma; EBMD epithelial/anterior basement membrane dystrophy; ACIOL anterior chamber intraocular lens; IOL intraocular lens; PCIOL posterior chamber intraocular lens; Phaco/IOL phacoemulsification with intraocular lens placement; [place] photorefractive keratectomy; LASIK laser assisted in situ keratomileusis; HTN hypertension; DM diabetes mellitus; COPD chronic obstructive pulmonary disease

## 2021-03-26 ENCOUNTER — Ambulatory Visit: Payer: Self-pay | Admitting: Cardiovascular Disease

## 2021-03-27 DIAGNOSIS — M25511 Pain in right shoulder: Secondary | ICD-10-CM | POA: Diagnosis not present

## 2021-03-27 NOTE — Telephone Encounter (Signed)
Unable to reach the patient. [place]

## 2021-03-28 DIAGNOSIS — Z23 Encounter for immunization: Secondary | ICD-10-CM | POA: Diagnosis not present

## 2021-04-01 ENCOUNTER — Other Ambulatory Visit: Payer: Self-pay | Admitting: Family Medicine

## 2021-04-17 ENCOUNTER — Encounter (INDEPENDENT_AMBULATORY_CARE_PROVIDER_SITE_OTHER): Payer: Medicare Other | Admitting: Ophthalmology

## 2021-04-24 ENCOUNTER — Ambulatory Visit (INDEPENDENT_AMBULATORY_CARE_PROVIDER_SITE_OTHER): Payer: Medicare Other | Admitting: Ophthalmology

## 2021-04-24 ENCOUNTER — Other Ambulatory Visit: Payer: Self-pay

## 2021-04-24 ENCOUNTER — Encounter (INDEPENDENT_AMBULATORY_CARE_PROVIDER_SITE_OTHER): Payer: Self-pay | Admitting: Ophthalmology

## 2021-04-24 DIAGNOSIS — H43821 Vitreomacular adhesion, right eye: Secondary | ICD-10-CM

## 2021-04-24 DIAGNOSIS — H43822 Vitreomacular adhesion, left eye: Secondary | ICD-10-CM

## 2021-04-24 DIAGNOSIS — H35072 Retinal telangiectasis, left eye: Secondary | ICD-10-CM | POA: Diagnosis not present

## 2021-04-24 DIAGNOSIS — I2510 Atherosclerotic heart disease of native coronary artery without angina pectoris: Secondary | ICD-10-CM | POA: Diagnosis not present

## 2021-04-24 DIAGNOSIS — H35071 Retinal telangiectasis, right eye: Secondary | ICD-10-CM | POA: Diagnosis not present

## 2021-04-24 DIAGNOSIS — H35352 Cystoid macular degeneration, left eye: Secondary | ICD-10-CM | POA: Diagnosis not present

## 2021-04-24 DIAGNOSIS — E113293 Type 2 diabetes mellitus with mild nonproliferative diabetic retinopathy without macular edema, bilateral: Secondary | ICD-10-CM | POA: Diagnosis not present

## 2021-04-24 MED ORDER — FLUORESCEIN SODIUM 10 % IV SOLN
500.0000 mg | INTRAVENOUS | Status: AC | PRN
  Administered 2021-04-24: 500 mg via INTRAVENOUS

## 2021-04-24 NOTE — Progress Notes (Signed)
04/24/2021     CHIEF COMPLAINT Patient presents for  Chief Complaint  Patient presents with   Retina Follow Up      HISTORY OF PRESENT ILLNESS: Matthew [name] is a 80 y.o. adult who presents to the clinic today for:   HPI     Retina Follow Up   Patient presents with  Other.  In left eye.  This started 5 weeks ago.  Severity is mild.  Duration of 5 weeks.  Since onset it is gradually improving.        Comments   5 week fu fp ffa l/r oct Pt states, "I feel like overall my vision is better. I have noticed that in my left eye I am noticing this white thing in the corner of my vision that is there all the time and I see it no matter where I look." A1C: 6.4 LBS: 104 Pt reports using Ketorolac QID OS       Last edited by Kendra Opitz, COA on 04/24/2021 10:51 AM.      Referring physician: Warden Fillers, MD [address] [place],  [place] [address]  HISTORICAL INFORMATION:   Selected notes from the MEDICAL RECORD NUMBER    Lab Results  Component Value Date   HGBA1C 7.2 (H) 03/11/2021     CURRENT MEDICATIONS: Current Outpatient Medications (Ophthalmic Drugs)  Medication Sig   ketorolac (ACULAR) 0.4 % SOLN Place 1 drop into the left eye 4 (four) times daily.   No current facility-administered medications for this visit. (Ophthalmic Drugs)   Current Outpatient Medications (Other)  Medication Sig   aspirin EC 81 MG tablet Take 81 mg by mouth daily. Swallow whole.   atorvastatin (LIPITOR) 40 MG tablet Take 1 tablet by mouth once daily   azelastine (ASTELIN) 0.1 % nasal spray Place 1 spray into both nostrils daily as needed for rhinitis. Use in each nostril as directed   chlorthalidone (HYGROTON) 25 MG tablet Take 1 tablet by mouth once daily   cholecalciferol (VITAMIN D3) 25 MCG (1000 UNIT) tablet Take 1,000 Units by mouth daily.   colchicine 0.6 MG tablet Take 0.6 mg by mouth daily as needed (for gout).   Continuous Blood Gluc Receiver  (FREESTYLE LIBRE 14 DAY READER) DEVI 2 each by Does not apply route daily.   Continuous Blood Gluc Sensor (FREESTYLE LIBRE 14 DAY SENSOR) MISC 2 each by Does not apply route daily.   cyclobenzaprine (FLEXERIL) 5 MG tablet TAKE ONE TABLET BY MOUTH TWICE DAILY AS NEEDED FOR MUSCLE SPASM (Patient taking differently: Take 5 mg by mouth 2 (two) times daily as needed for muscle spasms.)   fexofenadine (ALLEGRA) 180 MG tablet Take 180 mg by mouth daily.   fluticasone (FLONASE) 50 MCG/ACT nasal spray USE 2 SPRAY(S) IN EACH NOSTRIL ONCE DAILY AS NEEDED (FOR  ALLERGIES  OR  RHINITIS)   metoprolol succinate (TOPROL-XL) 100 MG 24 hr tablet TAKE 1 TABLET EVERY DAY WITH OR IMMMEDIATELY FOLLOWING A MEAL   NIFEdipine (PROCARDIA-XL) 30 MG (OSM) 24 hr tablet Take 30 mg by mouth 2 (two) times daily.   NOVOLIN 70/30 RELION (70-30) 100 UNIT/ML injection INJECT 22 UNITS INTO THE SKIN IN THE MORNING AND 20 UNITS IN THE EVENING (Patient taking differently: Inject 20-22 Units into the skin See admin instructions. Inject 22 units subcutaneously in the morning & inject 20 units subcutaneously in the evening)   oxymetazoline (AFRIN) 0.05 % nasal spray Place 1 spray into both nostrils 2 (  two) times daily as needed for congestion.   pioglitazone (ACTOS) 30 MG tablet Take 1 tablet by mouth once daily   No current facility-administered medications for this visit. (Other)      REVIEW OF SYSTEMS:    ALLERGIES Allergies  Allergen Reactions   Penicillins Hives   Probenecid     Developed lump on chest- went away when he stopped the medicine   Tetracycline Hcl Hives   Allopurinol Itching    PAST MEDICAL HISTORY Past Medical History:  Diagnosis Date   CLAUDICATION 05/14/2007   CORONARY ARTERY DISEASE 12/28/2006   Myoview 8/21: EF 73, normal perfusion, low risk    Diabetes mellitus type II, controlled ([place]) 12/28/2006   Actos 42m, insulin 70/30 20 units BID, amaryl 144mpreviously a1c <8. Worsened due to cold over 5  weeks around 05/2014 eating poorly and eating sugary syrups.   Stomach irritation on metformin.  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   Vitrectomy, no membrane peel, 12-05-2020   Past Surgical History:  Procedure Laterality Date   ABDOMINAL AORTOGRAM W/LOWER EXTREMITY N/A 02/10/2020   Procedure: ABDOMINAL AORTOGRAM W/LOWER EXTREMITY;  Surgeon: FiElam DutchMD;  [facility];  Service: Cardiovascular;  Laterality: N/A;   CATARACT EXTRACTION Left 06/21/2020   COLONOSCOPY     CORONARY ARTERY BYPASS GRAFT     ENDARTERECTOMY FEMORAL Left 02/27/2020   Procedure: LEFT COMMON FEMORAL ENDARTERECTOMY;  Surgeon: FiElam DutchMD;  [facility];  Service: Vascular;  Laterality: Left;   FEMORAL-POPLITEAL BYPASS GRAFT Left 02/27/2020   Procedure: LEFT FEMORAL-ABOVE KNEE POPLITEAL BYPASS WITH NON REVERSED GREATER SAPHENOUS VEIN;  Surgeon: FiElam DutchMD;  [facility] Service: Vascular;  Laterality: Left;   FEMORAL-POPLITEAL BYPASS GRAFT Right 04/23/2020   Procedure: BYPASS GRAFT FEMORAL- Above Knee POPLITEAL ARTERY RIGHT Using Right Greater Saphenous Vein;  Surgeon: FiElam DutchMD;  [facility];  Service: Vascular;  Laterality: Right;   KNEE ARTHROSCOPY  2012   UPPER GASTROINTESTINAL ENDOSCOPY      FAMILY HISTORY Family History  Problem Relation Age of Onset   Diabetes Mother    Hypertension Mother    Stroke Father    Colon cancer Neg Hx    Esophageal cancer Neg Hx     Rectal cancer Neg Hx    Stomach cancer Neg Hx     SOCIAL HISTORY Social History   Tobacco Use   Smoking status: Former    Types: Cigarettes    Quit date: 07/09/1968    Years since quitting: 52.8   Smokeless tobacco: Former    Types: Chew    Quit date: 07/09/1988   Tobacco comments:    minimal use x 10 years   Vaping Use   Vaping Use: Never used  Substance Use Topics   Alcohol use: No    Comment: last was 6 months   Drug use: No  OPHTHALMIC EXAM:  Base Eye Exam     Visual Acuity (ETDRS)       Right Left   Dist [place] 20/20 20/20         Tonometry (Tonopen, 10:55 AM)       Right Left   Pressure 13 17         Pupils       Pupils Dark Light Shape React APD   Right PERRL 4 3 Round Brisk None   Left PERRL 4 3 Round Brisk None         Visual Fields (Counting fingers)       Left Right    Full Full         Extraocular Movement       Right Left    Full Full         Neuro/Psych     Oriented x3: Yes   Mood/Affect: Normal         Dilation     Both eyes: 1.0% Mydriacyl, 2.5% Phenylephrine @ 10:55 AM           Slit Lamp and Fundus Exam     External Exam       Right Left   External Normal Normal         Slit Lamp Exam       Right Left   Lids/Lashes Normal Normal   Conjunctiva/Sclera White and quiet White and quiet   Cornea Clear Clear   Anterior Chamber Deep and quiet Deep and quiet   Iris Round and reactive Round and reactive   Lens 3+ Nuclear sclerosis Centered posterior chamber intraocular lens   Anterior Vitreous Normal Normal         Fundus Exam       Right Left   Posterior Vitreous Normal Normal   Disc Normal Normal   C/D Ratio 0.55 0.5   Macula Microaneurysms, Pseudohole with pseudo cystoid change likely a serous elevation noted on OCT, no MAs seen Cluster of  Microaneurysms temporal portion of faz, no macular hole, negative Watzke sign   Vessels Normal Normal, no sign of retinal vein occlusion    Periphery Normal Normal            IMAGING AND PROCEDURES  Imaging and Procedures for 04/24/21  OCT, Retina - OU - Both Eyes       Right Eye Quality was good. Scan locations included subfoveal. Central Foveal Thickness: 321. Progression has been stable. Findings include abnormal foveal contour, vitreous traction, vitreomacular adhesion .   Left Eye Quality was good. Scan locations included subfoveal. Central Foveal Thickness: 305. Progression has improved. Findings include abnormal foveal contour, cystoid macular edema.   Notes Vitreal macular traction with small serous subfoveal detachment OD, and no OCT evidence of CME   OS, vastly improved 3 months post vitrectomy for VMT with inner and outer foveal distortion and vision loss..  Outer photoreceptor layer now normal without serous fluid.  Small regional noncentral involved CME on temporal edge of FAZ newly present.  This is most likely MAC-TEL OS     Color Fundus Photography Optos - OU - Both Eyes       Right Eye Progression has no prior data. Disc findings include normal observations. Macula : microaneurysms. Periphery : normal observations.   Left Eye Progression has no prior data. Disc findings include normal observations. Macula : microaneurysms. Periphery : normal observations.   Notes Bilateral microaneurysm changes mostly in the para macular region.  Only mild and may be borderline moderate nonproliferative diabetic retinopathy left eye with mild NPDR right eye     Fluorescein Angiography Optos (Transit OS)       Injection: 500 mg Fluorescein Sodium 10 %   Route: Intravenous   NDC: [phone]   Right Eye   Progression has no prior data. Mid/Late phase findings include microaneurysm. Choroidal neovascularization is not present.   Left Eye   Progression has no prior data. Early phase findings include microaneurysm. Mid/Late phase findings include microaneurysm. Choroidal neovascularization is not  present.   Notes OD with careful peripheral retinal inspection with minimal peripheral NPDR however in the para macular region there are clusters of microaneurysms with angiographic leakage which are not readily apparent on OCT despite the presence of VMT centrally.  OS Paracentral and what appears to be type II MAC-TEL, temporal aspect of the fovea, which is coincident with CME noted on OCT.             ASSESSMENT/PLAN:  Type 2 macular telangiectasis, left OS with perifoveal CME in the temporal aspect of the fovea, classic watershed area for type II MAC-TEL.  I have asked the patient and he reports that his wife does not report him snoring however his wife trims herself does sleep heavily and may not be an adequate observer.  I explained to the patient that while he may not snore there are other types of sleep apnea and he does need to be checked for nightly hypoxic and/or hypertensive damage that could be present in undiagnosed and certainly untreated sleep apnea.  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  To me this is further mild evidence that he does have sleep apnea and undiagnosed and needs home sleep study     ICD-10-CM   1. Cystoid macular edema of left eye  H35.352 OCT, Retina - OU - Both Eyes    Fluorescein Angiography Optos (Transit OS)    Fluorescein Sodium 10 %  injection 500 mg    2. Vitreomacular adhesion of left eye  H43.822     3. Type 2 macular telangiectasis, left  H35.072 OCT, Retina - OU - Both Eyes    Color Fundus Photography Optos - OU - Both Eyes    Fluorescein Angiography Optos (Transit OS)    Fluorescein Sodium 10 % injection 500 mg    4. Type 2 macular telangiectasis, right  H35.071 OCT, Retina - OU - Both Eyes    Color Fundus Photography Optos - OU - Both Eyes    Fluorescein Angiography Optos (Transit OS)    Fluorescein Sodium 10 % injection 500 mg    5. Vitreomacular traction syndrome of right eye  H43.821     6. Mild nonproliferative diabetic retinopathy of both eyes associated with type 2 diabetes mellitus, macular edema presence unspecified ([place])  I68.0321       1.  OS status post successful vitrectomy  to prevent macular hole progression.  Outer retinal changes at the OD photoreceptor layer have dramatically improved post vitrectomy in the left eye.  However there are microaneurysm will change in the temporal watershed region of the foveal avascular zone classic for type II MAC-TEL.  Angiographic evidence in both eyes confirms that he has microvascular disease damage around the macula of each eye.  I do believe that this is potentially hypoxic damage that can be developed and accrue over a period of time of having sleep apnea.  Patient is encouraged and in fact urged to undergo home sleep study under the direction of his PCP Dr. Garret Reddish to look for and screen for even mild to moderate sleep apnea which could be present  2.  No specific therapy is warranted for his current findings in the macular each eye as there is a paucity of mild nonproliferative diabetic retinopathy peripherally from his diabetic condition  3.  Ophthalmic Meds Ordered this visit:  Meds ordered this encounter  Medications   Fluorescein Sodium 10 % injection 500 mg       Return in about 4 months (around 08/25/2021) for DILATE OU,  OCT.  There are no Patient Instructions on file for this visit.   Explained the diagnoses, plan, and follow up with the patient and they expressed understanding.  Patient expressed understanding of the importance of proper follow up care.   Clent Demark Markeita Alicia M.D. Diseases & Surgery of the Retina and Vitreous Retina [facility] Diabetic [place] 04/24/21     Abbreviations: M myopia (nearsighted); A astigmatism; H hyperopia (farsighted); P presbyopia; Mrx spectacle prescription;  CTL contact lenses; OD right eye; OS left eye; OU both eyes  XT exotropia; ET esotropia; PEK punctate epithelial keratitis; PEE punctate epithelial erosions; DES dry eye syndrome; MGD meibomian gland dysfunction; ATs artificial tears; PFAT's preservative free artificial tears; [place] nuclear sclerotic cataract; PSC posterior subcapsular cataract; ERM epi-retinal membrane; PVD posterior vitreous detachment; RD retinal detachment; DM diabetes mellitus; DR diabetic retinopathy; NPDR non-proliferative diabetic retinopathy; PDR proliferative diabetic retinopathy; CSME clinically significant macular edema; DME diabetic macular edema; dbh dot blot hemorrhages; CWS cotton wool spot; POAG primary open angle glaucoma; C/D cup-to-disc ratio; HVF humphrey visual field; GVF goldmann visual field; OCT optical coherence tomography; IOP intraocular pressure; BRVO Branch retinal vein occlusion; CRVO central retinal vein occlusion; CRAO central retinal artery occlusion; BRAO branch retinal artery occlusion; RT retinal tear; SB scleral buckle; PPV pars plana vitrectomy; VH Vitreous hemorrhage; PRP panretinal laser photocoagulation; IVK intravitreal kenalog; VMT vitreomacular traction; MH Macular hole;  NVD neovascularization of the disc; NVE neovascularization elsewhere; AREDS age related eye disease study; ARMD age related macular degeneration; POAG primary open angle glaucoma; EBMD epithelial/anterior basement membrane dystrophy; ACIOL anterior chamber  intraocular lens; IOL intraocular lens; PCIOL posterior chamber intraocular lens; Phaco/IOL phacoemulsification with intraocular lens placement; Kelly photorefractive keratectomy; LASIK laser assisted in situ keratomileusis; HTN hypertension; DM diabetes mellitus; COPD chronic obstructive pulmonary disease

## 2021-04-24 NOTE — Assessment & Plan Note (Signed)
OU with only mild nonproliferative diabetic retinopathy way out of proportion that he is not very much present on angiography of each eye as compared to the perifoveal macular changes with microaneurysm will change this does raise the specter of low oxygen levels potentially with from sleep apnea that causes this type of macular finding in the absence of significant peripheral diabetic retinopathy.  To me this is further mild evidence that he does have sleep apnea and undiagnosed and needs home sleep study

## 2021-04-24 NOTE — Assessment & Plan Note (Signed)
OD, minor

## 2021-04-24 NOTE — Assessment & Plan Note (Signed)
OS with perifoveal CME in the temporal aspect of the fovea, classic watershed area for type II MAC-TEL.  I have asked the patient and he reports that his wife does not report him snoring however his wife trims herself does sleep heavily and may not be an adequate observer.  I explained to the patient that while he may not snore there are other types of sleep apnea and he does need to be checked for nightly hypoxic and/or hypertensive damage that could be present in undiagnosed and certainly untreated sleep apnea.  I have urged the patient to consider home sleep study test under the direction of his PCP just to confirm he does not have sleep apnea

## 2021-04-25 ENCOUNTER — Other Ambulatory Visit: Payer: Self-pay | Admitting: Family Medicine

## 2021-04-26 DIAGNOSIS — M79671 Pain in right foot: Secondary | ICD-10-CM | POA: Diagnosis not present

## 2021-04-26 DIAGNOSIS — M2012 Hallux valgus (acquired), left foot: Secondary | ICD-10-CM | POA: Diagnosis not present

## 2021-04-26 DIAGNOSIS — E1151 Type 2 diabetes mellitus with diabetic peripheral angiopathy without gangrene: Secondary | ICD-10-CM | POA: Diagnosis not present

## 2021-04-26 DIAGNOSIS — I739 Peripheral vascular disease, unspecified: Secondary | ICD-10-CM | POA: Diagnosis not present

## 2021-04-26 DIAGNOSIS — L84 Corns and callosities: Secondary | ICD-10-CM | POA: Diagnosis not present

## 2021-04-26 DIAGNOSIS — M79672 Pain in left foot: Secondary | ICD-10-CM | POA: Diagnosis not present

## 2021-04-26 DIAGNOSIS — M2011 Hallux valgus (acquired), right foot: Secondary | ICD-10-CM | POA: Diagnosis not present

## 2021-05-13 ENCOUNTER — Ambulatory Visit (INDEPENDENT_AMBULATORY_CARE_PROVIDER_SITE_OTHER)
Admission: RE | Admit: 2021-05-13 | Discharge: 2021-05-13 | Disposition: A | Discharge status: Home / Self Care | Payer: Medicare Other | Source: Ambulatory Visit | Attending: Surgery | Admitting: Surgery

## 2021-05-13 ENCOUNTER — Ambulatory Visit (INDEPENDENT_AMBULATORY_CARE_PROVIDER_SITE_OTHER): Payer: Medicare Other | Admitting: Physician Assistant

## 2021-05-13 ENCOUNTER — Other Ambulatory Visit: Payer: Self-pay

## 2021-05-13 ENCOUNTER — Ambulatory Visit (HOSPITAL_COMMUNITY)
Admission: RE | Admit: 2021-05-13 | Discharge: 2021-05-13 | Disposition: A | Discharge status: Home / Self Care | Payer: Medicare Other | Source: Ambulatory Visit | Attending: Surgery | Admitting: Surgery

## 2021-05-13 VITALS — BP 127/63 | HR 57 | Temp 98.1°F | Resp 16 | Ht 72.0 in | Wt 200.3 lb

## 2021-05-13 DIAGNOSIS — I739 Peripheral vascular disease, unspecified: Secondary | ICD-10-CM

## 2021-05-13 DIAGNOSIS — I2510 Atherosclerotic heart disease of native coronary artery without angina pectoris: Secondary | ICD-10-CM

## 2021-05-13 NOTE — Progress Notes (Signed)
Office Note     CC:  follow up Requesting Provider:  Marin Olp, MD  HPI: Matthew [name] is a 80 y.o. (1941/06/25) adult who presents for follow up of peripheral artery disease. He has extensive surgical history including right femoral to above knee popliteal bypass by Dr. Oneida Alar in October of 2021. He also has undergone left femoral endarterectomy and left femoral to above knee popliteal vein bypass in August of 2021 by Dr. Oneida Alar.  At the time of his last visit in August he was having some claudication symptoms. This occurred at 5-7 blocks distance and improved with rest. He was not having any rest pain or tissue loss. He remained compliant with Aspirin and statin. His duplex's bilaterally showed some stenosis in his bypass grafts, but his ABI's were stable and his symptoms were not disabling so he given closer interval follow up.  He presents today for that follow up with repeat non invasive studies. He reports some continued claudication like symptoms. He says he is able to ambulate same distance before needing to rest. He explains that he has not been walking as much recently so has not noticed it as much. He does stay active doing a lot of yard work. He more so has concerns of numbness on the anterior right leg. This occurs usually after he has been up on his legs for some time. He describes it as a sort of numb, falling asleep, weak ness right along front of his right leg. This does not occur with the left leg. It resolves with rest. He does continue to have swelling in both legs, right greater than left. This is unchanged. He wears compression stockings almost daily. He also elevates regularly. He denies any pain in his legs at rest or pain that wakes him up from sleep. He is compliant with his aspirin and statin.   The pt is on a statin for cholesterol management.  The pt is on a daily aspirin.   Other AC: none The pt is on BB for hypertension.   The pt is diabetic.  Tobacco hx:  Former, 1970 Past Medical History:  Diagnosis Date   CLAUDICATION 05/14/2007   CORONARY ARTERY DISEASE 12/28/2006   Myoview 8/21: EF 73, normal perfusion, low risk    Diabetes mellitus type II, controlled ([place]) 12/28/2006   Actos 30mg , insulin 70/30 20 units BID, amaryl 1mg  previously a1c <8. Worsened due to cold over 5 weeks around 05/2014 eating poorly and eating sugary syrups.   Stomach irritation on metformin.  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   Vitrectomy, no membrane peel, 12-05-2020    Past Surgical History:  Procedure Laterality Date   ABDOMINAL AORTOGRAM W/LOWER EXTREMITY N/A 02/10/2020   Procedure: ABDOMINAL AORTOGRAM W/LOWER EXTREMITY;  Surgeon: Elam Dutch, MD;  Location: [facility];  Service: Cardiovascular;  Laterality: N/A;   CATARACT EXTRACTION Left 06/21/2020   CATARACT  EXTRACTION Right    COLONOSCOPY     CORONARY ARTERY BYPASS GRAFT     ENDARTERECTOMY FEMORAL Left 02/27/2020   Procedure: LEFT COMMON FEMORAL ENDARTERECTOMY;  Surgeon: Elam Dutch, MD;  Location: [facility] OR;  Service: Vascular;  Laterality: Left;   FEMORAL-POPLITEAL BYPASS GRAFT Left 02/27/2020   Procedure: LEFT FEMORAL-ABOVE KNEE POPLITEAL BYPASS WITH NON REVERSED GREATER SAPHENOUS VEIN;  Surgeon: Elam Dutch, MD;  Location: [facility] OR;  Service: Vascular;  Laterality: Left;   FEMORAL-POPLITEAL BYPASS GRAFT Right 04/23/2020   Procedure: BYPASS GRAFT FEMORAL- Above Knee POPLITEAL ARTERY RIGHT Using Right  Greater Saphenous Vein;  Surgeon: Elam Dutch, MD;  Location: [facility] OR;  Service: Vascular;  Laterality: Right;   KNEE ARTHROSCOPY  2012   UPPER GASTROINTESTINAL ENDOSCOPY      Social History   Socioeconomic History   Marital status: Married    Spouse name: Not on file   Number of children: 2   Years of education: Not on file   Highest education level: Not on file  Occupational History   Occupation: retired  Tobacco Use   Smoking status: Former    Types: Cigarettes    Quit date: 07/09/1968    Years since quitting: 52.8   Smokeless tobacco: Former    Types: Chew    Quit date: 07/09/1988   Tobacco comments:    minimal use x 10 years   Vaping Use   Vaping Use: Never used  Substance and Sexual Activity   Alcohol use: No    Comment: last was 6 months   Drug use: No   Sexual activity: Not on file  Other Topics Concern   Not on file  Social History Narrative   Married 1983. 2 sons. No grandkids yet.       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       Hobbies: volunteer work, watch sports   Social Determinants of Radio broadcast assistant Strain: Not on file  Food Insecurity: Not on file  Transportation Needs: Not on file  Physical Activity: Not on file  Stress: Not on file  Social Connections: Not on file  Intimate Partner Violence: Not on file    Family History  Problem Relation Age of Onset   Diabetes Mother    Hypertension Mother    Stroke Father    Colon cancer Neg Hx    Esophageal cancer Neg Hx    Rectal cancer Neg Hx    Stomach cancer Neg Hx     Current Outpatient Medications  Medication Sig Dispense Refill   aspirin EC 81 MG tablet Take 81 mg by mouth daily. Swallow whole.     atorvastatin (LIPITOR) 40 MG tablet Take 1 tablet by mouth once daily 90 tablet 0   azelastine (ASTELIN) 0.1 % nasal spray Place 1 spray into both nostrils daily as needed for rhinitis. Use in each nostril as  directed     chlorthalidone (HYGROTON) 25 MG tablet Take 1 tablet by mouth once daily 90 tablet 0   cholecalciferol (VITAMIN D3) 25 MCG (1000 UNIT) tablet Take 1,000 Units by mouth daily.     colchicine 0.6 MG tablet Take 0.6 mg by mouth daily as needed (for gout).     Continuous Blood Gluc Receiver (FREESTYLE LIBRE 14 DAY READER) DEVI 2 each by Does not apply route daily. 2 each 11   Continuous Blood Gluc Sensor (FREESTYLE LIBRE 14 DAY SENSOR) MISC 2  each by Does not apply route daily. 2 each 11   cyclobenzaprine (FLEXERIL) 5 MG tablet TAKE ONE TABLET BY MOUTH TWICE DAILY AS NEEDED FOR MUSCLE SPASM (Patient taking differently: Take 5 mg by mouth 2 (two) times daily as needed for muscle spasms.) 30 tablet 0   fexofenadine (ALLEGRA) 180 MG tablet Take 180 mg by mouth daily.     fluticasone (FLONASE) 50 MCG/ACT nasal spray USE 2 SPRAY(S) IN EACH NOSTRIL ONCE DAILY AS NEEDED (FOR  ALLERGIES  OR  RHINITIS) 16 g 0   ketorolac (ACULAR) 0.4 % SOLN Place 1 drop into the left eye 4 (four) times daily.     metoprolol succinate (TOPROL-XL) 100 MG 24 hr tablet TAKE 1 TABLET EVERY DAY WITH OR IMMMEDIATELY FOLLOWING A MEAL 90 tablet 1   NIFEdipine (PROCARDIA-XL) 30 MG (OSM) 24 hr tablet Take 30 mg by mouth 2 (two) times daily.     NOVOLIN 70/30 RELION (70-30) 100 UNIT/ML injection INJECT 22 UNITS INTO THE SKIN IN THE MORNING AND 20 UNITS IN THE EVENING (Patient taking differently: Inject 20-22 Units into the skin See admin instructions. Inject 22 units subcutaneously in the morning & inject 20 units subcutaneously in the evening) 10 mL 0   oxymetazoline (AFRIN) 0.05 % nasal spray Place 1 spray into both nostrils 2 (two) times daily as needed for congestion.     pioglitazone (ACTOS) 30 MG tablet Take 1 tablet by mouth once daily 90 tablet 0   No current facility-administered medications for this visit.    Allergies  Allergen Reactions   Penicillins Hives   Probenecid     Developed lump on chest- went away  when he stopped the medicine   Tetracycline Hcl Hives   Allopurinol Itching     REVIEW OF SYSTEMS:  C[X]  denotes positive finding, [ ]  denotes negative finding Cardiac  Comments:  Chest pain or chest pressure:    Shortness of breath upon exertion:    Short of breath when lying flat:    Irregular heart rhythm:        Vascular    Pain in calf, thigh, or hip brought on by ambulation:    Pain in feet at night that wakes you up from your sleep:     Blood clot in your veins:    Leg swelling:  X Right > left. Since CABG 25+ years ago      Pulmonary    Oxygen at home:    Productive cough:     Wheezing:         Neurologic    Sudden weakness in arms or legs:     Sudden numbness in arms or legs:     Sudden onset of difficulty speaking or slurred speech:    Temporary loss of vision in one eye:     Problems with dizziness:         Gastrointestinal    Blood in stool:     Vomited blood:         Genitourinary    Burning when urinating:     Blood in urine:        Psychiatric    Major depression:         Hematologic    Bleeding problems:    Problems with blood clotting too easily:        Skin    Rashes or ulcers:        Constitutional    Fever or chills:  PHYSICAL EXAMINATION:  Vitals:   05/13/21 1310  BP: 127/63  Pulse: (!) 57  Resp: 16  Temp: 98.1 F (36.7 C)  TempSrc: Temporal  SpO2: 97%  Weight: 200 lb 4.8 oz (90.9 kg)  Height: 6' (1.829 m)    General:  WDWN in NAD; vital signs documented above Gait: Normal HENT: WNL, normocephalic Pulmonary: normal non-labored breathing , without  wheezing Cardiac:  HR, without  Murmurs, without carotid bruit Abdomen: soft, NT, no masses Vascular Exam/Pulses:  Right Left  Radial 2+ (normal) 2+ (normal)  Femoral 2+ (normal) 2+ (normal)  Popliteal Not palpable Not palpable  DP 2+ (normal) 2+ (normal)  PT absent absent   Extremities: without ischemic changes, without Gangrene , without cellulitis; without open  wounds; mild edema bilateral lower extremities Musculoskeletal: no muscle wasting or atrophy  Neurologic: A&O X 3;  No focal weakness or paresthesias are detected Psychiatric:  The pt has Normal affect.   Non-Invasive Vascular Imaging:  05/13/21  +-------+-----------+-----------+------------+------------+  ABI/TBIToday's ABIToday's TBIPrevious ABIPrevious TBI  +-------+-----------+-----------+------------+------------+  Right  0.96       0.62       1.05        0.52          +-------+-----------+-----------+------------+------------+  Left   0.94       0.74       0.96        0.68          +-------+-----------+-----------+------------+------------+   Right ABIs appear decreased. Left ABIs appear essentially unchanged compared to prior study on 02/14/2021.   Right Graft #1: femoral to AK popliteal  +------------------+--------+---------------+--------+-------------+                    PSV cm/sStenosis       WaveformComments       +------------------+--------+---------------+--------+-------------+  Inflow            125                    biphasic               +------------------+--------+---------------+--------+-------------+  Prox Anastomosis  204                    biphasic               +------------------+--------+---------------+--------+-------------+  Proximal Graft    123                    biphasic               +------------------+--------+---------------+--------+-------------+  Mid Graft         282     50-70% stenosisbiphasicarea of valve  +------------------+--------+---------------+--------+-------------+  Distal Graft      139                    biphasic               +------------------+--------+---------------+--------+-------------+  Distal Anastomosis126                    biphasic               +------------------+--------+---------------+--------+-------------+  Outflow           123                     biphasic               +------------------+--------+---------------+--------+-------------+  Left Graft #1: femoral to AK popliteal  +--------------------+--------+---------------+--------+-------------------  ----+                      PSV cm/sStenosis       WaveformComments                  +--------------------+--------+---------------+--------+-------------------  ----+  Inflow              127                    biphasic                          +--------------------+--------+---------------+--------+-------------------  ----+  Proximal Anastomosis147                    biphasic                          +--------------------+--------+---------------+--------+-------------------  ----+  Proximal Graft      150                    biphasic                          +--------------------+--------+---------------+--------+-------------------  ----+  Mid Graft           224     50-70% stenosisbiphasicdisease not  appreciated  +--------------------+--------+---------------+--------+-------------------  ----+  Distal Graft        176                    biphasic                          +--------------------+--------+---------------+--------+-------------------  ----+  Distal Anastomosis  165                    biphasic                          +--------------------+--------+---------------+--------+-------------------  ----+  Outflow             110                    biphasic                          +--------------------+--------+---------------+--------+-------------------  ----+   Summary:  Right: Patent bypass graft with elevated velocities observed in the mid segment without the presence of disease. This appears to be a dilatation of the vein where a valve would have been pre-operatively.   Left: Patent bypass graft with elevated velocities observed in the mid segment without the presence of disease.      ASSESSMENT/PLAN:: 80 y.o. adult here for follow up for peripheral artery disease. He is s/p right femoral to above knee popliteal reversed saphenous vein bypass by Dr. Oneida Alar in October of 2021. He also has undergone left femoral endarterectomy and left femoral to above knee popliteal vein bypass in August of 2021 by Dr. Oneida Alar. he continues to have non lifestyle limiting claudication symptoms. He also suffers from a lot of neuropathy symptoms which are more bothersome to him - BLE ABI's are essentially unchanged from prior study. Slight decrease in right ABI from last visit - Duplex today shows stable mid graft stenosis  bilaterally - Continue Aspirin and statin - He understands that if he has new or worsening symptoms he will for earlier follow up - Since his studies and symptoms are overall stable I will have him return in 6 months with repeat ABI and BLE bypass graft duplex   Karoline Caldwell, PA-C [facility] MD:   Trula Slade

## 2021-05-16 ENCOUNTER — Other Ambulatory Visit: Payer: Self-pay

## 2021-05-16 DIAGNOSIS — I739 Peripheral vascular disease, unspecified: Secondary | ICD-10-CM

## 2021-07-10 ENCOUNTER — Other Ambulatory Visit: Payer: Self-pay | Admitting: Family Medicine

## 2021-07-15 DIAGNOSIS — N1831 Chronic kidney disease, stage 3a: Secondary | ICD-10-CM | POA: Diagnosis not present

## 2021-07-24 DIAGNOSIS — E1122 Type 2 diabetes mellitus with diabetic chronic kidney disease: Secondary | ICD-10-CM | POA: Diagnosis not present

## 2021-07-24 DIAGNOSIS — I129 Hypertensive chronic kidney disease with stage 1 through stage 4 chronic kidney disease, or unspecified chronic kidney disease: Secondary | ICD-10-CM | POA: Diagnosis not present

## 2021-07-24 DIAGNOSIS — M109 Gout, unspecified: Secondary | ICD-10-CM | POA: Diagnosis not present

## 2021-07-24 DIAGNOSIS — R609 Edema, unspecified: Secondary | ICD-10-CM | POA: Diagnosis not present

## 2021-07-24 DIAGNOSIS — N1831 Chronic kidney disease, stage 3a: Secondary | ICD-10-CM | POA: Diagnosis not present

## 2021-07-26 DIAGNOSIS — E1151 Type 2 diabetes mellitus with diabetic peripheral angiopathy without gangrene: Secondary | ICD-10-CM | POA: Diagnosis not present

## 2021-07-26 DIAGNOSIS — L84 Corns and callosities: Secondary | ICD-10-CM | POA: Diagnosis not present

## 2021-07-26 DIAGNOSIS — L603 Nail dystrophy: Secondary | ICD-10-CM | POA: Diagnosis not present

## 2021-07-26 DIAGNOSIS — I739 Peripheral vascular disease, unspecified: Secondary | ICD-10-CM | POA: Diagnosis not present

## 2021-07-29 ENCOUNTER — Other Ambulatory Visit: Payer: Self-pay | Admitting: Family Medicine

## 2021-08-14 ENCOUNTER — Other Ambulatory Visit: Payer: Self-pay | Admitting: Family Medicine

## 2021-08-26 ENCOUNTER — Other Ambulatory Visit: Payer: Self-pay | Admitting: Family Medicine

## 2021-08-27 ENCOUNTER — Encounter (INDEPENDENT_AMBULATORY_CARE_PROVIDER_SITE_OTHER): Payer: Medicare Other | Admitting: Ophthalmology

## 2021-08-29 ENCOUNTER — Other Ambulatory Visit: Payer: Self-pay

## 2021-08-29 ENCOUNTER — Encounter: Payer: Self-pay | Admitting: Cardiovascular Disease

## 2021-08-29 ENCOUNTER — Ambulatory Visit (INDEPENDENT_AMBULATORY_CARE_PROVIDER_SITE_OTHER): Payer: Medicare Other | Admitting: Cardiovascular Disease

## 2021-08-29 VITALS — BP 132/64 | HR 56 | Ht 72.0 in | Wt 203.2 lb

## 2021-08-29 DIAGNOSIS — I739 Peripheral vascular disease, unspecified: Secondary | ICD-10-CM | POA: Diagnosis not present

## 2021-08-29 DIAGNOSIS — E782 Mixed hyperlipidemia: Secondary | ICD-10-CM

## 2021-08-29 DIAGNOSIS — I10 Essential (primary) hypertension: Secondary | ICD-10-CM | POA: Diagnosis not present

## 2021-08-29 DIAGNOSIS — I2510 Atherosclerotic heart disease of native coronary artery without angina pectoris: Secondary | ICD-10-CM | POA: Diagnosis not present

## 2021-08-29 NOTE — Patient Instructions (Signed)
Medication Instructions:  ?Your physician recommends that you continue on your current medications as directed. Please refer to the Current Medication list given to you today. ? ?*If you need a refill on your cardiac medications before your next appointment, please call your pharmacy* ? ? ?Lab Work: ?NONE ?If you have labs (blood work) drawn today and your tests are completely normal, you will receive your results only by: ?MyChart Message (if you have MyChart) OR ?A paper copy in the mail ?If you have any lab test that is abnormal or we need to change your treatment, we will call you to review the results. ? ? ?Testing/Procedures: ?NONE ? ? ?Follow-Up: ?At [facility], you and your health needs are our priority.  As part of our continuing mission to provide you with exceptional heart care, we have created designated Provider Care Teams.  These Care Teams include your primary Cardiologist (physician) and Advanced Practice Providers (APPs -  Physician Assistants and Nurse Practitioners) who all work together to provide you with the care you need, when you need it. ? ?Your next appointment:   ?1 year(s) ? ?The format for your next appointment:   ?In Person ? ?Provider:   ?Sherren Mocha, MD   ? ? ?  ?

## 2021-08-29 NOTE — Progress Notes (Signed)
Cardiology Office Note:    Date:  08/29/2021   ID:  Matthew [name], DOB 1940/10/03, MRN [id number]  PCP:  Marin Olp, MD   [facility] Providers Cardiologist:  Sherren Mocha, MD     Referring MD: Marin Olp, MD   Chief Complaint  Patient presents with   Coronary Artery Disease    History of Present Illness:    Matthew [name] is a 81 y.o. adult with a hx of: Coronary artery disease S/p CABG in 1997 Myoview 5/13: no ischemia  Echocardiogram 7/19: EF 55-60, Gr 1 DD Peripheral arterial disease  Hx of bilat SFA occlusions managed medically since 2003 Staged fem-pop bypasses in 2021 Diabetes mellitus  Chronic kidney disease  GERD; PUD Hypertension  Hyperlipidemia   The patient is here alone today.  He is doing very well from a cardiac standpoint.  He denies chest pain, chest pressure, shortness of breath, orthopnea, PND, or heart palpitations.  He does have some neuropathy symptoms but states that his legs overall have felt better ever since he had lower extremity bypass surgery.  The patient is compliant with his medications.  Past Medical History:  Diagnosis Date   CLAUDICATION 05/14/2007   CORONARY ARTERY DISEASE 12/28/2006   Myoview 8/21: EF 73, normal perfusion, low risk    Diabetes mellitus type II, controlled ([place]) 12/28/2006   Actos 30mg , insulin 70/30 20 units BID, amaryl 1mg  previously a1c <8. Worsened due to cold over 5 weeks around 05/2014 eating poorly and eating sugary syrups.   Stomach irritation on metformin.  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/20/2020   Vitrectomy, no membrane peel, 12-05-2020    Past Surgical History:  Procedure Laterality Date   ABDOMINAL AORTOGRAM W/LOWER EXTREMITY N/A 02/10/2020   Procedure: ABDOMINAL AORTOGRAM W/LOWER EXTREMITY;  Surgeon: Elam Dutch, MD;  Location: [place] [facility];  Service: Cardiovascular;  Laterality: N/A;   CATARACT EXTRACTION Left 06/21/2020   CATARACT EXTRACTION Right    COLONOSCOPY     CORONARY ARTERY BYPASS GRAFT     ENDARTERECTOMY FEMORAL Left 02/27/2020   Procedure: LEFT COMMON FEMORAL ENDARTERECTOMY;  Surgeon: Elam Dutch, MD;  Location: [place];  Service: Vascular;  Laterality: Left;   FEMORAL-POPLITEAL BYPASS GRAFT Left 02/27/2020   Procedure: LEFT FEMORAL-ABOVE KNEE POPLITEAL BYPASS WITH NON REVERSED GREATER SAPHENOUS VEIN;  Surgeon: Elam Dutch, MD;  Location: [place];  Service: Vascular;  Laterality: Left;   FEMORAL-POPLITEAL BYPASS GRAFT Right 04/23/2020   Procedure: BYPASS GRAFT FEMORAL- Above Knee POPLITEAL ARTERY RIGHT Using Right Greater Saphenous Vein;  Surgeon: Elam Dutch, MD;  Location: [place];  Service: Vascular;  Laterality: Right;   KNEE ARTHROSCOPY  2012   UPPER GASTROINTESTINAL ENDOSCOPY      Current Medications: Current Meds  Medication Sig   aspirin EC 81 MG tablet Take 81 mg by mouth daily. Swallow whole.   atorvastatin (LIPITOR) 40 MG  tablet Take 1 tablet by mouth once daily   Azelastine HCl 137 MCG/SPRAY SOLN USE 1 SPRAY(S) IN EACH NOSTRIL TWICE DAILY AS DIRECTED   chlorthalidone (HYGROTON) 25 MG tablet Take 1 tablet by mouth once daily   cholecalciferol (VITAMIN D3) 25 MCG (1000 UNIT) tablet Take 1,000 Units by mouth daily.   colchicine 0.6 MG tablet Take 0.6 mg by mouth daily as needed (for gout).   Continuous Blood Gluc Receiver (FREESTYLE LIBRE 14 DAY READER) DEVI 2 each by Does not apply route daily.   Continuous Blood Gluc Sensor (FREESTYLE  LIBRE 14 DAY SENSOR) MISC 2 each by Does not apply route daily.   cyclobenzaprine (FLEXERIL) 5 MG tablet TAKE ONE TABLET BY MOUTH TWICE DAILY AS NEEDED FOR MUSCLE SPASM (Patient taking differently: Take 5 mg by mouth 2 (two) times daily as needed for muscle spasms.)   fexofenadine (ALLEGRA) 180 MG tablet Take 180 mg by mouth daily.   fluticasone (FLONASE) 50 MCG/ACT nasal spray USE 2 SPRAY(S) IN EACH NOSTRIL ONCE DAILY AS NEEDED   metoprolol succinate (TOPROL-XL) 100 MG 24 hr tablet TAKE 1 TABLET EVERY DAY WITH OR IMMMEDIATELY FOLLOWING A MEAL   NIFEdipine (PROCARDIA-XL) 30 MG (OSM) 24 hr tablet Take 30 mg by mouth 2 (two) times daily.   NOVOLIN 70/30 RELION (70-30) 100 UNIT/ML injection INJECT 22 UNITS INTO THE SKIN IN THE MORNING AND 20 UNITS IN THE EVENING (Patient taking differently: Inject 20-22 Units into the skin See admin instructions. Inject 22 units subcutaneously in the morning & inject 20 units subcutaneously in the evening)   oxymetazoline (AFRIN) 0.05 % nasal spray Place 1 spray into both nostrils 2 (two) times daily as needed for congestion.   pioglitazone (ACTOS) 30 MG tablet Take 1 tablet by mouth once daily     Allergies:   Penicillins, Probenecid, Tetracycline hcl, and Allopurinol   Social History   Socioeconomic History   Marital status: Married    Spouse name: Not on file   Number of children: 2   Years of education: Not on file   Highest education level: Not on file  Occupational History   Occupation: retired  Tobacco Use   Smoking status: Former    Types: Cigarettes    Quit date: 07/09/1968    Years since quitting: 53.1   Smokeless tobacco: Former    Types: Chew    Quit date: 07/09/1988   Tobacco comments:    minimal use x 10 years   Vaping Use   Vaping Use: Never used  Substance and Sexual Activity   Alcohol use: No    Comment: last was 6 months   Drug use: No   Sexual activity: Not on file  Other Topics Concern   Not on file  Social History Narrative    Married 1983. 2 sons. No grandkids yet.       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       Hobbies: volunteer work, watch sports   Social Determinants of Radio broadcast assistant Strain: Not on file  Food Insecurity: Not on file  Transportation Needs: Not on file  Physical Activity: Not on file  Stress: Not on file  Social Connections: Not on file     Family History: The patient's family history includes Diabetes in [name] "Joe"'s mother; Hypertension in [name] "Joe"'s mother; Stroke in [place] "Joe"'s father. There is no history of Colon cancer,  Esophageal cancer, Rectal cancer, or Stomach cancer.  ROS:   Please see the history of present illness.    All other systems reviewed and are negative.  EKGs/Labs/Other Studies Reviewed:    EKG:  EKG is ordered today.  The ekg ordered today demonstrates sinus bradycardia 56 bpm, ST-T changes consider inferior ischemia  Recent Labs: 01/07/2021: Hemoglobin 14.0; Platelets 194 03/11/2021: ALT 11; BUN 29; Creatinine, Ser 1.60; Potassium 4.0; Sodium 140  Recent Lipid Panel    Component Value Date/Time   CHOL 98 04/24/2020 0059   TRIG 60 04/24/2020 0059   HDL 33 (L) 04/24/2020 0059   CHOLHDL 3.0 04/24/2020 0059   VLDL 12 04/24/2020 0059   LDLCALC 53 04/24/2020 0059   LDLDIRECT 69.0 03/11/2021 1116     Risk Assessment/Calculations:           Physical Exam:    VS:  BP 132/64    Pulse (!) 56    Ht 6' (1.829 m)    Wt 203 lb 3.2 oz (92.2 kg)    SpO2 95%    BMI 27.56 kg/m     Wt Readings from Last 3 Encounters:  08/29/21 203 lb 3.2 oz (92.2 kg)  05/13/21 200 lb 4.8 oz (90.9 kg)  03/11/21 197 lb 9.6 oz (89.6 kg)     GEN:  Well nourished, well developed in no acute distress HEENT: Normal NECK: No JVD; No carotid bruits LYMPHATICS: No lymphadenopathy CARDIAC: RRR, no murmurs, rubs, gallops RESPIRATORY:  Clear to auscultation  without rales, wheezing or rhonchi  ABDOMEN: Soft, non-tender, non-distended MUSCULOSKELETAL: Trace bilateral pretibial edema with compression stockings in place; No deformity  SKIN: Warm and dry NEUROLOGIC:  Alert and oriented x 3 PSYCHIATRIC:  Normal affect   ASSESSMENT:    1. Coronary artery disease involving native coronary artery of native heart without angina pectoris   2. Essential hypertension   3. Mixed hyperlipidemia   4. PAD (peripheral artery disease) (HCC)    PLAN:    In order of problems listed above:  The patient is doing well on aspirin, beta-blocker, and high intensity statin drug.  He has no cardiac-related functional limitation.  Current medicines will be continued.  I will see him back in 1 year. Blood pressure is well controlled on a combination of metoprolol succinate and Procardia XL.  He also takes chlorthalidone.  Most recent labs are reviewed demonstrating a creatinine of 1.65 and a potassium of 4.0. Treated with a high intensity statin drug.  Most recent direct LDL is 69 mg/dL.  Transaminases are normal. On appropriate medical therapy.  Followed by vascular surgery.           Medication Adjustments/Labs and Tests Ordered: Current medicines are reviewed at length with the patient today.  Concerns regarding medicines are outlined above.  Orders Placed This Encounter  Procedures   EKG 12-Lead   No orders of the defined types were placed in this encounter.   Patient Instructions  Medication Instructions:  Your physician recommends that you continue on your current medications as directed. Please refer to the Current Medication list given to you today.  *If you need a refill on your cardiac medications before your next appointment, please call your pharmacy*   Lab Work: NONE If you have labs (blood work) drawn today and your tests are completely normal, you will receive your results only by: [place] (if you have MyChart) OR A paper copy in  the mail If you have any lab test that is  abnormal or we need to change your treatment, we will call you to review the results.   Testing/Procedures: NONE   Follow-Up: At [facility], you and your health needs are our priority.  As part of our continuing mission to provide you with exceptional heart care, we have created designated Provider Care Teams.  These Care Teams include your primary Cardiologist (physician) and Advanced Practice Providers (APPs -  Physician Assistants and Nurse Practitioners) who all work together to provide you with the care you need, when you need it.  Your next appointment:   1 year(s)  The format for your next appointment:   In Person  Provider:   Sherren Mocha, MD         Signed, Sherren Mocha, MD  08/29/2021 7:36 PM    [place]

## 2021-09-09 DIAGNOSIS — Z961 Presence of intraocular lens: Secondary | ICD-10-CM | POA: Diagnosis not present

## 2021-09-09 DIAGNOSIS — H0102B Squamous blepharitis left eye, upper and lower eyelids: Secondary | ICD-10-CM | POA: Diagnosis not present

## 2021-09-09 DIAGNOSIS — E119 Type 2 diabetes mellitus without complications: Secondary | ICD-10-CM | POA: Diagnosis not present

## 2021-09-09 DIAGNOSIS — Z794 Long term (current) use of insulin: Secondary | ICD-10-CM | POA: Diagnosis not present

## 2021-09-09 DIAGNOSIS — H43823 Vitreomacular adhesion, bilateral: Secondary | ICD-10-CM | POA: Diagnosis not present

## 2021-09-09 DIAGNOSIS — H0102A Squamous blepharitis right eye, upper and lower eyelids: Secondary | ICD-10-CM | POA: Diagnosis not present

## 2021-09-16 ENCOUNTER — Ambulatory Visit (INDEPENDENT_AMBULATORY_CARE_PROVIDER_SITE_OTHER): Payer: Medicare Other | Admitting: Ophthalmology

## 2021-09-16 ENCOUNTER — Other Ambulatory Visit: Payer: Self-pay

## 2021-09-16 ENCOUNTER — Encounter (INDEPENDENT_AMBULATORY_CARE_PROVIDER_SITE_OTHER): Payer: Self-pay | Admitting: Ophthalmology

## 2021-09-16 DIAGNOSIS — I2510 Atherosclerotic heart disease of native coronary artery without angina pectoris: Secondary | ICD-10-CM | POA: Diagnosis not present

## 2021-09-16 DIAGNOSIS — Z961 Presence of intraocular lens: Secondary | ICD-10-CM

## 2021-09-16 DIAGNOSIS — H43822 Vitreomacular adhesion, left eye: Secondary | ICD-10-CM

## 2021-09-16 DIAGNOSIS — H35352 Cystoid macular degeneration, left eye: Secondary | ICD-10-CM

## 2021-09-16 DIAGNOSIS — H35072 Retinal telangiectasis, left eye: Secondary | ICD-10-CM | POA: Diagnosis not present

## 2021-09-16 DIAGNOSIS — E113213 Type 2 diabetes mellitus with mild nonproliferative diabetic retinopathy with macular edema, bilateral: Secondary | ICD-10-CM | POA: Diagnosis not present

## 2021-09-16 DIAGNOSIS — H43821 Vitreomacular adhesion, right eye: Secondary | ICD-10-CM | POA: Diagnosis not present

## 2021-09-16 LAB — HM DIABETES EYE EXAM

## 2021-09-16 NOTE — Assessment & Plan Note (Signed)
OD minor VMT with no impact on acuity will continue to monitor and observe ?

## 2021-09-16 NOTE — Assessment & Plan Note (Signed)
No detectable diabetic retinopathy OU 

## 2021-09-16 NOTE — Assessment & Plan Note (Signed)
OS, slight increase in CME temporal aspect of the FAZ, will continue to monitor, PECHR ? ?Patient denies having sleep apnea or snoring in the past ?

## 2021-09-16 NOTE — Assessment & Plan Note (Signed)
Stable OU 

## 2021-09-16 NOTE — Progress Notes (Signed)
? ? ?09/16/2021 ? ?  ? ?CHIEF COMPLAINT ?Patient presents for  ?Chief Complaint  ?Patient presents with  ? Cystoid Macular Edema  ? ? ?Follow-up OS, minor center involved CME persisting, but no impact on acuity ? ?History of VMA, VMT OS with center involved serous elevation of the fovea visual acuity impact, now some 9 months post surgery OS, vitrectomy ? ?HISTORY OF PRESENT ILLNESS: ?Matthew [name] is a 81 y.o. adult who presents to the clinic today for:  ? ?HPI   ?History of CME OS, no interval change, still using topical medications ?Last edited by Hurman Horn, MD on 09/16/2021 10:45 AM.  ?  ? ? ?Referring physician: ?Warden Fillers, MD ?[address] ?[address] ?[place],  [place] [address] ? ?HISTORICAL INFORMATION:  ? ?Selected notes from the [facility] ?  ? ?Lab Results  ?Component Value Date  ? HGBA1C 7.2 (H) 03/11/2021  ?  ? ?CURRENT MEDICATIONS: ?No current outpatient medications on file. (Ophthalmic Drugs)  ? ?No current facility-administered medications for this visit. (Ophthalmic Drugs)  ? ?Current Outpatient Medications (Other)  ?Medication Sig  ? aspirin EC 81 MG tablet Take 81 mg by mouth daily. Swallow whole.  ? atorvastatin (LIPITOR) 40 MG tablet Take 1 tablet by mouth once daily  ? Azelastine HCl 137 MCG/SPRAY SOLN USE 1 SPRAY(S) IN EACH NOSTRIL TWICE DAILY AS DIRECTED  ? chlorthalidone (HYGROTON) 25 MG tablet Take 1 tablet by mouth once daily  ? cholecalciferol (VITAMIN D3) 25 MCG (1000 UNIT) tablet Take 1,000 Units by mouth daily.  ? colchicine 0.6 MG tablet Take 0.6 mg by mouth daily as needed (for gout).  ? Continuous Blood Gluc Receiver (FREESTYLE LIBRE 14 DAY READER) DEVI 2 each by Does not apply route daily.  ? Continuous Blood Gluc Sensor (FREESTYLE LIBRE 14 DAY SENSOR) MISC 2 each by Does not apply route daily.  ? cyclobenzaprine (FLEXERIL) 5 MG tablet TAKE ONE TABLET BY MOUTH TWICE DAILY AS NEEDED FOR MUSCLE SPASM (Patient taking differently: Take 5 mg by mouth 2 (two) times  daily as needed for muscle spasms.)  ? fexofenadine (ALLEGRA) 180 MG tablet Take 180 mg by mouth daily.  ? fluticasone (FLONASE) 50 MCG/ACT nasal spray USE 2 SPRAY(S) IN EACH NOSTRIL ONCE DAILY AS NEEDED  ? metoprolol succinate (TOPROL-XL) 100 MG 24 hr tablet TAKE 1 TABLET EVERY DAY WITH OR IMMMEDIATELY FOLLOWING A MEAL  ? NIFEdipine (PROCARDIA-XL) 30 MG (OSM) 24 hr tablet Take 30 mg by mouth 2 (two) times daily.  ? NOVOLIN 70/30 RELION (70-30) 100 UNIT/ML injection INJECT 22 UNITS INTO THE SKIN IN THE MORNING AND 20 UNITS IN THE EVENING (Patient taking differently: Inject 20-22 Units into the skin See admin instructions. Inject 22 units subcutaneously in the morning & inject 20 units subcutaneously in the evening)  ? oxymetazoline (AFRIN) 0.05 % nasal spray Place 1 spray into both nostrils 2 (two) times daily as needed for congestion.  ? pioglitazone (ACTOS) 30 MG tablet Take 1 tablet by mouth once daily  ? ?No current facility-administered medications for this visit. (Other)  ? ? ? ? ?REVIEW OF SYSTEMS: ?ROS   ?Negative for: Constitutional, Gastrointestinal, Neurological, Skin, Genitourinary, Musculoskeletal, HENT, Endocrine, Cardiovascular, Eyes, Respiratory, Psychiatric, Allergic/Imm, Heme/Lymph ?Last edited by Hurman Horn, MD on 09/16/2021 10:41 AM.  ?  ? ? ? ?ALLERGIES ?Allergies  ?Allergen Reactions  ? Penicillins Hives  ? Probenecid   ?  Developed lump on chest- went away when he stopped the medicine  ?  Tetracycline Hcl Hives  ? Allopurinol Itching  ? ? ?PAST MEDICAL HISTORY ?Past Medical History:  ?Diagnosis Date  ? Cataract, nuclear sclerotic, right eye 11/20/2020  ? CLAUDICATION 05/14/2007  ? CORONARY ARTERY DISEASE 12/28/2006  ? Myoview 8/21: EF 73, normal perfusion, low risk   ? 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 Diabetes mellitus type II, controlled ([place]) 12/28/2006  ? Actos '30mg'$ , insulin 70/30 20 units BID, amaryl '1mg'$  previously a1c <8. Worsened due to cold over 5 weeks around 05/2014  eating poorly and eating sugary syrups.   Stomach irritation on metformin.  Lab Results  Component Value Date   HGBA1C 8.7* 06/05/2014       ? DIABETES MELLITUS, TYPE II 12/28/2006  ? Duodenal ulcer   ? Erosive gastritis   ? GERD (gastroesophageal reflux disease)   ? GOUT 12/28/2006  ? Hemorrhoids   ? Hiatal hernia   ? HYPERLIPIDEMIA 12/28/2006  ? HYPERTENSION 12/28/2006  ? LATERAL EPICONDYLITIS, RIGHT 12/11/2009  ? Myocardial infarction [facility])   ? 23 yrs ago per pt  ? Raynaud's disease   ? RENAL FAILURE, CHRONIC 09/15/2008  ? Tubular adenoma of colon 11/2010  ? 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 Vitrectomy, no membrane peel, 12-05-2020  ? ?Past Surgical History:  ?Procedure Laterality Date  ? ABDOMINAL AORTOGRAM W/LOWER EXTREMITY N/A 02/10/2020  ? Procedure: ABDOMINAL AORTOGRAM W/LOWER EXTREMITY;  Surgeon: Elam Dutch, MD;  Location: [place] [facility];  Service: Cardiovascular;  Laterality: N/A;  ? CATARACT EXTRACTION Left 06/21/2020  ? CATARACT EXTRACTION Right   ? COLONOSCOPY    ? CORONARY ARTERY BYPASS GRAFT    ? ENDARTERECTOMY FEMORAL Left 02/27/2020  ? Procedure: LEFT COMMON FEMORAL ENDARTERECTOMY;  Surgeon: Elam Dutch, MD;  [facility];  Service: Vascular;  Laterality: Left;  ? FEMORAL-POPLITEAL BYPASS GRAFT Left 02/27/2020  ? Procedure: LEFT FEMORAL-ABOVE KNEE POPLITEAL BYPASS WITH NON REVERSED GREATER SAPHENOUS VEIN;  Surgeon: Elam Dutch, MD;  Location: [place];  Service: Vascular;  Laterality: Left;  ? FEMORAL-POPLITEAL BYPASS GRAFT Right 04/23/2020  ? Procedure: BYPASS GRAFT FEMORAL- Above Knee POPLITEAL ARTERY RIGHT Using Right Greater Saphenous Vein;  Surgeon: Elam Dutch, MD;  Location: [place];  Service: Vascular;  Laterality: Right;  ? KNEE ARTHROSCOPY  2012  ? UPPER GASTROINTESTINAL ENDOSCOPY    ? ? ?FAMILY HISTORY ?Family History  ?Problem Relation Age of Onset  ? Diabetes Mother   ? Hypertension Mother   ? Stroke Father   ? Colon cancer Neg Hx   ? Esophageal cancer  Neg Hx   ? Rectal cancer Neg Hx   ? Stomach cancer Neg Hx   ? ? ?SOCIAL HISTORY ?Social History  ? ?Tobacco Use  ? Smoking status: Former  ?  Types: Cigarettes  ?  Quit date: 07/09/1968  ?  Years since quitting: 53.2  ? Smokeless tobacco: Former  ?  Types: Chew  ?  Quit date: 07/09/1988  ? Tobacco comments:  ?  minimal use x 10 years   ?Vaping Use  ? Vaping Use: Never used  ?Substance Use Topics  ? Alcohol use: No  ?  Comment: last was 6 months  ? Drug use: No  ? ?  ? ?  ? ?OPHTHALMIC EXAM: ? ?Base Eye Exam   ? ? Visual Acuity (ETDRS)   ? ?   Right Left  ? Dist [place] 20/20 -2 20/20 -2  ? ?  ?  ? ? Pupils   ? ?  Pupils APD  ? Right PERRL None  ? Left PERRL   ? ?  ?  ? ? Visual Fields   ? ?   Left Right  ?  Full Full  ? ?  ?  ? ? Extraocular Movement   ? ?   Right Left  ?  Full, Ortho Full, Ortho  ? ?  ?  ? ? Neuro/Psych   ? ? Oriented x3: Yes  ? Mood/Affect: Normal  ? ?  ?  ? ? Dilation   ? ? Both eyes: 1.0% Mydriacyl, 2.5% Phenylephrine @ 10:44 AM  ? ?  ?  ? ?  ? ?Slit Lamp and Fundus Exam   ? ? External Exam   ? ?   Right Left  ? External Normal Normal  ? ?  ?  ? ? Slit Lamp Exam   ? ?   Right Left  ? Lids/Lashes Normal Normal  ? Conjunctiva/Sclera White and quiet White and quiet  ? Cornea Clear Clear  ? Anterior Chamber Deep and quiet Deep and quiet  ? Iris Round and reactive Round and reactive  ? Lens 3+ Nuclear sclerosis Centered posterior chamber intraocular lens  ? Anterior Vitreous Normal Normal  ? ?  ?  ? ? Fundus Exam   ? ?   Right Left  ? Posterior Vitreous Normal, no Pvd clear, avitric  ? Disc Normal Normal  ? C/D Ratio 0.55 0.5  ? Macula Microaneurysms, Pseudohole with pseudo cystoid change likely a serous elevation noted on OCT, no MAs seen Cluster of  Microaneurysms temporal portion of faz, no macular hole, negative Watzke sign  ? Vessels Normal, no DR Normal, no sign of retinal vein occlusion, no DR  ? Periphery Normal Normal  ? ?  ?  ? ?  ? ? ?IMAGING AND PROCEDURES  ?Imaging and Procedures for  09/16/21 ? ?OCT, Retina - OU - Both Eyes   ? ?   ?Right Eye ?Quality was good. Scan locations included subfoveal. Central Foveal Thickness: 332. Progression has been stable. Findings include abnormal foveal contour, vi

## 2021-09-23 NOTE — Progress Notes (Signed)
? ?Phone [phone] ?In person visit ?  ?Subjective:  ? ?Matthew [name] is a 81 y.o. year old very pleasant adult patient who presents for/with See problem oriented charting ?Chief Complaint  ?Patient presents with  ? Follow-up  ? Hyperlipidemia  ? Diabetes  ? Gout  ? Hypertension  ? Tingling  ?  Pt c/o tingling from thumb that radiates up to his forearm that started from a fall.   ? buttock pain  ?  Pt c/o right side buttock pain that aggravates after sitting.   ? ?This visit occurred during the SARS-CoV-2 public health emergency.  Safety protocols were in place, including screening questions prior to the visit, additional usage of staff PPE, and extensive cleaning of exam room while observing appropriate contact time as indicated for disinfecting solutions.  ? ?Past Medical History-  ?Patient Active Problem List  ? Diagnosis Date Noted  ? 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  Priority: High  ? PAD (peripheral artery disease) (HCC)-with claudication 05/14/2007  ?  Priority: High  ? Mild nonproliferative diabetic retinopathy of both eyes ([place]) 12/28/2006  ?  Priority: High  ?  Coronary artery disease s/p CABG 1997 12/28/2006  ?  Priority: High  ? Cervical arthritis ([place]) 10/25/2014  ?  Priority: Medium   ? Erectile dysfunction 08/13/2010  ?  Priority: Medium   ? CKD (chronic kidney disease), stage III ([place]) 09/15/2008  ?  Priority: Medium   ? Hyperlipidemia associated with type 2 diabetes mellitus ([place]) 12/28/2006  ?  Priority: Medium   ? 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  ?  Priority: Medium   ? Hypertension associated with diabetes ([place]) 12/28/2006  ?  Priority: Medium   ? Allergic rhinitis 11/18/2016  ?  Priority: Low  ? Pulmonary nodule seen on imaging study 05/17/2012  ?  Priority: Low  ? Type 2 macular telangiectasis, 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  ? Pseudophakia of left eye 12/06/2020  ? Vitreomacular traction syndrome of right eye 11/20/2020  ? ? ?Medications- reviewed and updated ?Current Outpatient Medications   ?Medication Sig Dispense Refill  ? aspirin EC 81 MG tablet Take 81 mg by mouth daily. Swallow whole.    ? atorvastatin (LIPITOR) 40 MG tablet Take 1 tablet by mouth once daily 90 tablet 0  ? Azelastine HCl 137 MCG/SPRAY SOLN USE 1 SPRAY(S) IN EACH NOSTRIL TWICE DAILY AS DIRECTED 30 mL 0  ? chlorthalidone (HYGROTON) 25 MG tablet Take 1 tablet by mouth once daily 90 tablet 0  ? cholecalciferol (VITAMIN D3) 25 MCG (1000 UNIT) tablet Take 1,000 Units by mouth daily.    ? colchicine 0.6 MG tablet Take 0.6 mg by mouth daily as needed (for gout).    ? Continuous Blood Gluc Receiver (FREESTYLE LIBRE 14 DAY READER) DEVI 2 each by Does not apply route daily. 2 each 11  ? Continuous Blood Gluc Sensor (FREESTYLE LIBRE 14 DAY SENSOR) MISC 2 each by Does not apply route daily. 2 each 11  ? cyclobenzaprine (FLEXERIL) 5 MG tablet TAKE ONE TABLET BY MOUTH TWICE DAILY AS NEEDED FOR MUSCLE SPASM (Patient taking differently: Take 5 mg by mouth 2 (two) times daily as needed for muscle spasms.) 30 tablet 0  ? fexofenadine (ALLEGRA) 180 MG tablet Take 180 mg by mouth daily.    ? fluticasone (FLONASE) 50 MCG/ACT nasal spray USE 2 SPRAY(S) IN EACH NOSTRIL ONCE DAILY AS NEEDED 16 g 0  ? metoprolol succinate (TOPROL-XL) 100 MG 24 hr tablet TAKE 1 TABLET EVERY DAY WITH  OR IMMMEDIATELY FOLLOWING A MEAL 90 tablet 1  ? NIFEdipine (PROCARDIA-XL) 30 MG (OSM) 24 hr tablet Take 30 mg by mouth 2 (two) times daily.    ? NOVOLIN 70/30 RELION (70-30) 100 UNIT/ML injection INJECT 22 UNITS INTO THE SKIN IN THE MORNING AND 20 UNITS IN THE EVENING (Patient taking differently: Inject 20-22 Units into the skin See admin instructions. Inject 22 units subcutaneously in the morning & inject 20 units subcutaneously in the evening) 10 mL 0  ? oxymetazoline (AFRIN) 0.05 % nasal spray Place 1 spray into both nostrils 2 (two) times daily as needed for congestion.    ? pioglitazone (ACTOS) 30 MG tablet Take 1 tablet by mouth once daily 90 tablet 0  ? ?No current  facility-administered medications for this visit.  ? ?  ?Objective:  ?BP (!) 150/62   Pulse (!) 54   Temp 98.1 ?F (36.7 ?C)   Ht 6' (1.829 m)   Wt 202 lb (91.6 kg)   SpO2 95%   BMI 27.40 kg/m?  ?Gen: NAD, resting comfortably ?CV: RRR no murmurs rubs or gallops ?Lungs: CTAB no crackles, wheeze, rhonchi ?Ext: trace to 1+  edema under compression stockings ?Skin: warm, dry ? ?  ? ?Assessment and Plan  ? ?#social update- wife with multiple myeloma diagnosed 2021- last set of bloodwork didn't look as good for her. Working with oncologist and remaining optimistic ?  ?#PAD ?#CAD ?#hyperlipidemia ?S: Medication: Atorvastatin 40Mg daily. Aspirin 34m daily ?  ?-For PAD-seen by vascular surgery in November-overall stable on imaging-in regards to pain-neuropathy likely contributed to pain which can confuse picture- not overly disabling at this time ?- Seen by cardiology Dr. CBurt Knackon 08/29/2021.  Asymptomatic and plan was to continue current medication. No chest pain or shortness of breath ?Lab Results  ?Component Value Date  ? CHOL 98 04/24/2020  ? HDL 33 (L) 04/24/2020  ? L[place]53 04/24/2020  ? LDLDIRECT 69.0 03/11/2021  ? TRIG 60 04/24/2020  ? CHOLHDL 3.0 04/24/2020  ? A/P: PAD and CAD appears stable-continue current medications ? ?For hyperlipidemia-want LDL under 70-due for full lipid panel at this time and ordered today ? ?# Diabetes ?S: Medication:Actos 30 Mg daily, Novolin 70/30  21 down from 22  units in AM and 19 down from 20 units in PM ?- Discontinued Glimipride 170mdaily  as was getting some lows  in past.  ?- No significant edema on actos- does use compression ?CBGs- lowest he has seen has been 6052sMostly during the day.  ?Exercise and diet- neuropathy limits his exercise- if he exercises feels set backs ?Lab Results  ?Component Value Date  ? HGBA1C 7.2 (H) 03/11/2021  ? HGBA1C 6.4 11/05/2020  ? HGBA1C 8.1 (H) 02/24/2020  ?A/P: I think an A1c goal of 8 or even 8.5 at his age is reasonable especially to  avoid lows-update A1c today-for now continue current medication except reduce novolin to 20 units in the morning and 18 units before bed to help avoid lows ?-eye exam with Dr. RaZadie Rhine/20/23 ? ?#MSK concerns ?tingling in left thumb  up to mid forearm intermittently- started 6-7 weeks ago- better with repositioning.  ?Also with some right buttocks pain started 3-4 months ago ?Fell on right shoulder late last year and having issues lifting arm over head ? ?- concern for me that these may have orthopedic origin including nerve irritation in spine and possible frozen shoulder as he has noted some limited range of motion- will refer to [facility]  who he has sene in past ? ?#Gout ?S: on Colchicine 0.6 mg daily as needed-has had to use only once since last visit  ?-Did not tolerate probenecid or allopurinol.  Uloric not a great choice with CAD and PAD ?Lab Results  ?Component Value Date  ? LABURIC 7.9 (H) 11/05/2020  ?A/P:does not have the best options available to lower uric acid so we are going to continue colchicine as needed- luckily has done well overlal with only 1 flare in 6 months  ? ?#hypertension ?S: medication: Chlorthalidone 25 Mg daily,  procardia 30m BIDMetoprolol 1052mdaily XL - - was taken off lisinopril by Dr. FiOneida Alarunclear why) ?Home readings #s: 130-135/70s but hasnt recently checked ?BP Readings from Last 3 Encounters:  ?10/01/21 (!) 150/62  ?08/29/21 132/64  ?05/13/21 127/63  ?A/P: Blood pressure slightly high in office- continue current meds for now and check at home at least 3-4x a week for 2 weeks and then send me home readings- goal <135/85 at home ?  ?#Chronic kidney disease stage III ?S: GFR is typically in the 40s  range ?-Patient knows to avoid NSAIDs   ?A/P: Hopefully stable-update CMP with labs today ? ?Recommended follow up: Return in about 4 months (around 01/31/2022) for followup or sooner if needed.Schedule b4 you leave. ?Future Appointments  ?Date Time Provider [place] ?12/17/2021 10:15 AM [place], GaClent DemarkMD RDE-RDE None  ? ?Lab/Order associations: not fasting- had creamer in coffee ?  ICD-10-CM   ?1. Hyperlipidemia associated with type 2 diabetes mellitus (HCTerral E11.69   ? E78.5   ?  ?

## 2021-10-01 ENCOUNTER — Encounter: Payer: Self-pay | Admitting: Family Medicine

## 2021-10-01 ENCOUNTER — Ambulatory Visit (INDEPENDENT_AMBULATORY_CARE_PROVIDER_SITE_OTHER): Payer: Medicare Other | Admitting: Family Medicine

## 2021-10-01 VITALS — BP 150/62 | HR 54 | Temp 98.1°F | Ht 72.0 in | Wt 202.0 lb

## 2021-10-01 DIAGNOSIS — E785 Hyperlipidemia, unspecified: Secondary | ICD-10-CM

## 2021-10-01 DIAGNOSIS — R200 Anesthesia of skin: Secondary | ICD-10-CM

## 2021-10-01 DIAGNOSIS — E1169 Type 2 diabetes mellitus with other specified complication: Secondary | ICD-10-CM | POA: Diagnosis not present

## 2021-10-01 DIAGNOSIS — I10 Essential (primary) hypertension: Secondary | ICD-10-CM | POA: Diagnosis not present

## 2021-10-01 DIAGNOSIS — M25511 Pain in right shoulder: Secondary | ICD-10-CM

## 2021-10-01 DIAGNOSIS — M1A9XX1 Chronic gout, unspecified, with tophus (tophi): Secondary | ICD-10-CM | POA: Diagnosis not present

## 2021-10-01 DIAGNOSIS — N183 Chronic kidney disease, stage 3 unspecified: Secondary | ICD-10-CM | POA: Diagnosis not present

## 2021-10-01 DIAGNOSIS — I2510 Atherosclerotic heart disease of native coronary artery without angina pectoris: Secondary | ICD-10-CM

## 2021-10-01 DIAGNOSIS — I739 Peripheral vascular disease, unspecified: Secondary | ICD-10-CM | POA: Diagnosis not present

## 2021-10-01 DIAGNOSIS — E1122 Type 2 diabetes mellitus with diabetic chronic kidney disease: Secondary | ICD-10-CM | POA: Diagnosis not present

## 2021-10-01 DIAGNOSIS — Z794 Long term (current) use of insulin: Secondary | ICD-10-CM

## 2021-10-01 DIAGNOSIS — G8929 Other chronic pain: Secondary | ICD-10-CM | POA: Diagnosis not present

## 2021-10-01 DIAGNOSIS — R202 Paresthesia of skin: Secondary | ICD-10-CM

## 2021-10-01 DIAGNOSIS — M7918 Myalgia, other site: Secondary | ICD-10-CM | POA: Diagnosis not present

## 2021-10-01 LAB — CBC WITH DIFFERENTIAL/PLATELET
Basophils Absolute: 0 10*3/uL (ref 0.0–0.1)
Basophils Relative: 0.3 % (ref 0.0–3.0)
Eosinophils Absolute: 0.3 10*3/uL (ref 0.0–0.7)
Eosinophils Relative: 5.1 % — ABNORMAL HIGH (ref 0.0–5.0)
HCT: 43.1 % (ref 39.0–52.0)
Hemoglobin: 14.1 g/dL (ref 13.0–17.0)
Lymphocytes Relative: 15.8 % (ref 12.0–46.0)
Lymphs Abs: 0.9 10*3/uL (ref 0.7–4.0)
MCHC: 32.7 g/dL (ref 30.0–36.0)
MCV: 89.4 fl (ref 78.0–100.0)
Monocytes Absolute: 0.4 10*3/uL (ref 0.1–1.0)
Monocytes Relative: 7.7 % (ref 3.0–12.0)
Neutro Abs: 4 10*3/uL (ref 1.4–7.7)
Neutrophils Relative %: 71.1 % (ref 43.0–77.0)
Platelets: 193 10*3/uL (ref 150.0–400.0)
RBC: 4.82 Mil/uL (ref 4.22–5.81)
RDW: 13.5 % (ref 11.5–15.5)
WBC: 5.6 10*3/uL (ref 4.0–10.5)

## 2021-10-01 LAB — LIPID PANEL
Cholesterol: 119 mg/dL (ref 0–200)
HDL: 43.4 mg/dL (ref >39.00)
LDL Cholesterol: 65 mg/dL (ref 0–99)
NonHDL: 75.61
Total CHOL/HDL Ratio: 3
Triglycerides: 53 mg/dL (ref 0.0–149.0)
VLDL: 10.6 mg/dL (ref 0.0–40.0)

## 2021-10-01 LAB — COMPREHENSIVE METABOLIC PANEL
ALT: 11 U/L (ref 0–53)
AST: 21 U/L (ref 0–37)
Albumin: 4.2 g/dL (ref 3.5–5.2)
Alkaline Phosphatase: 63 U/L (ref 39–117)
BUN: 31 mg/dL — ABNORMAL HIGH (ref 6–23)
CO2: 32 mEq/L (ref 19–32)
Calcium: 9.9 mg/dL (ref 8.4–10.5)
Chloride: 103 mEq/L (ref 96–112)
Creatinine, Ser: 1.62 mg/dL — ABNORMAL HIGH (ref 0.40–1.50)
GFR: 40.24 mL/min — ABNORMAL LOW (ref >60.00)
Glucose, Bld: 119 mg/dL — ABNORMAL HIGH (ref 70–99)
Potassium: 4.6 mEq/L (ref 3.5–5.1)
Sodium: 140 mEq/L (ref 135–145)
Total Bilirubin: 0.5 mg/dL (ref 0.2–1.2)
Total Protein: 8.4 g/dL — ABNORMAL HIGH (ref 6.0–8.3)

## 2021-10-01 LAB — MICROALBUMIN / CREATININE URINE RATIO
Creatinine,U: 58.1 mg/dL
Microalb Creat Ratio: 28 mg/g (ref 0.0–30.0)
Microalb, Ur: 16.3 mg/dL — ABNORMAL HIGH (ref 0.0–1.9)

## 2021-10-01 LAB — HEMOGLOBIN A1C: Hgb A1c MFr Bld: 8.6 % — ABNORMAL HIGH (ref 4.6–6.5)

## 2021-10-01 NOTE — Patient Instructions (Addendum)
We will call you within two weeks about your referral to [facility]- thumb issue, shoulder, buttocks issues. If you do not hear within 2 weeks, give [place] a call.   ? ?for now continue current medication except reduce novolin to 20 units in the morning and 18 units before bed.  ? ?Please stop by lab before you go ?If you have mychart- we will send your results within 3 business days of [place] receiving them.  ?If you do not have mychart- we will call you about results within 5 business days of [place] receiving them.  ?*please also note that you will see labs on mychart as soon as they post. I will later go in and write notes on them- will say "notes from Dr. Yong Channel"  ? ?Blood pressure slightly high in office- continue current meds for now and check at home at least 3-4x a week for 2 weeks and then send me home readings- goal <135/85 at home ? ?Recommended follow up: Return in about 4 months (around 01/31/2022) for followup or sooner if needed.Schedule b4 you leave.  ?

## 2021-10-07 ENCOUNTER — Other Ambulatory Visit: Payer: Self-pay

## 2021-10-07 DIAGNOSIS — E1122 Type 2 diabetes mellitus with diabetic chronic kidney disease: Secondary | ICD-10-CM

## 2021-10-14 ENCOUNTER — Telehealth: Payer: Self-pay

## 2021-10-14 NOTE — Telephone Encounter (Signed)
Patient states he has a bad flare up of gout in his hand.  Refuses an OV with [facility] or any other provider.  States he was just in with Dr. Yong Channel for his CPE and spoke about his gout.  Is requesting script for colchicine to be sent to [facility] on [address]    Patient was informed that Dr. Yong Channel was out of the office and on turn around time.

## 2021-10-14 NOTE — Telephone Encounter (Signed)
Can medication be sent in w/o appt?  ?

## 2021-10-15 MED ORDER — COLCHICINE 0.6 MG PO TABS: 0.6000 mg | ORAL_TABLET | Freq: Every day | ORAL | 2 refills | Status: DC

## 2021-10-15 NOTE — Addendum Note (Signed)
Addended by: Marin Olp on: 10/15/2021 12:55 PM ? ? Modules accepted: Orders ? ?

## 2021-10-15 NOTE — Telephone Encounter (Signed)
Patient called back about medication refill. Let patient know today was Dr. Yong Channel first day back and was with patients. Tried to get the patient to scheduled appt but declined.  ?

## 2021-10-15 NOTE — Telephone Encounter (Signed)
I sent this in for him

## 2021-10-16 ENCOUNTER — Encounter: Payer: Self-pay | Admitting: *Deleted

## 2021-10-16 NOTE — Telephone Encounter (Signed)
Tried to contact pt, voicemail box is full unable to leave message. Will send patient My chart message. ?

## 2021-10-25 DIAGNOSIS — E1151 Type 2 diabetes mellitus with diabetic peripheral angiopathy without gangrene: Secondary | ICD-10-CM | POA: Diagnosis not present

## 2021-10-25 DIAGNOSIS — L603 Nail dystrophy: Secondary | ICD-10-CM | POA: Diagnosis not present

## 2021-10-25 DIAGNOSIS — I739 Peripheral vascular disease, unspecified: Secondary | ICD-10-CM | POA: Diagnosis not present

## 2021-10-25 DIAGNOSIS — L84 Corns and callosities: Secondary | ICD-10-CM | POA: Diagnosis not present

## 2021-11-06 DIAGNOSIS — M25511 Pain in right shoulder: Secondary | ICD-10-CM | POA: Diagnosis not present

## 2021-11-07 ENCOUNTER — Telehealth: Payer: Self-pay | Admitting: Family Medicine

## 2021-11-07 NOTE — Telephone Encounter (Signed)
Copied from [place]. Topic: Medicare AWV ?>> Nov 07, 2021 11:33 AM Harris-Coley, Hannah Beat wrote: ?Reason for CRM: Left message for patient to schedule Annual Wellness Visit.  Please schedule with Nurse Health Advisor Charlott Rakes, RN at [facility].  Please call [phone] ask for Juliann Pulse ?

## 2021-11-12 ENCOUNTER — Encounter (INDEPENDENT_AMBULATORY_CARE_PROVIDER_SITE_OTHER): Payer: Self-pay

## 2021-11-12 DIAGNOSIS — H0102B Squamous blepharitis left eye, upper and lower eyelids: Secondary | ICD-10-CM | POA: Diagnosis not present

## 2021-11-12 DIAGNOSIS — H43823 Vitreomacular adhesion, bilateral: Secondary | ICD-10-CM | POA: Diagnosis not present

## 2021-11-12 DIAGNOSIS — E119 Type 2 diabetes mellitus without complications: Secondary | ICD-10-CM | POA: Diagnosis not present

## 2021-11-12 DIAGNOSIS — Z961 Presence of intraocular lens: Secondary | ICD-10-CM | POA: Diagnosis not present

## 2021-11-12 DIAGNOSIS — Z794 Long term (current) use of insulin: Secondary | ICD-10-CM | POA: Diagnosis not present

## 2021-11-12 DIAGNOSIS — H0102A Squamous blepharitis right eye, upper and lower eyelids: Secondary | ICD-10-CM | POA: Diagnosis not present

## 2021-11-13 ENCOUNTER — Ambulatory Visit (HOSPITAL_COMMUNITY)
Admission: RE | Admit: 2021-11-13 | Discharge: 2021-11-13 | Disposition: A | Discharge status: Home / Self Care | Payer: Medicare Other | Source: Ambulatory Visit | Attending: Vascular Surgery | Admitting: Vascular Surgery

## 2021-11-13 ENCOUNTER — Ambulatory Visit (INDEPENDENT_AMBULATORY_CARE_PROVIDER_SITE_OTHER)
Admission: RE | Admit: 2021-11-13 | Discharge: 2021-11-13 | Disposition: A | Discharge status: Home / Self Care | Payer: Medicare Other | Source: Ambulatory Visit | Attending: Vascular Surgery | Admitting: Vascular Surgery

## 2021-11-13 ENCOUNTER — Encounter (INDEPENDENT_AMBULATORY_CARE_PROVIDER_SITE_OTHER): Payer: Self-pay | Admitting: Ophthalmology

## 2021-11-13 ENCOUNTER — Ambulatory Visit (INDEPENDENT_AMBULATORY_CARE_PROVIDER_SITE_OTHER): Payer: Medicare Other | Admitting: Ophthalmology

## 2021-11-13 ENCOUNTER — Ambulatory Visit (INDEPENDENT_AMBULATORY_CARE_PROVIDER_SITE_OTHER): Payer: Medicare Other | Admitting: Physician Assistant

## 2021-11-13 VITALS — BP 149/65 | HR 51 | Temp 98.2°F | Resp 20 | Ht 72.0 in | Wt 198.0 lb

## 2021-11-13 DIAGNOSIS — I739 Peripheral vascular disease, unspecified: Secondary | ICD-10-CM

## 2021-11-13 DIAGNOSIS — H35341 Macular cyst, hole, or pseudohole, right eye: Secondary | ICD-10-CM

## 2021-11-13 DIAGNOSIS — H43822 Vitreomacular adhesion, left eye: Secondary | ICD-10-CM | POA: Diagnosis not present

## 2021-11-13 DIAGNOSIS — H35352 Cystoid macular degeneration, left eye: Secondary | ICD-10-CM

## 2021-11-13 DIAGNOSIS — H43821 Vitreomacular adhesion, right eye: Secondary | ICD-10-CM | POA: Diagnosis not present

## 2021-11-13 DIAGNOSIS — I2510 Atherosclerotic heart disease of native coronary artery without angina pectoris: Secondary | ICD-10-CM

## 2021-11-13 MED ORDER — OFLOXACIN 0.3 % OP SOLN: 1.0000 [drp] | OPHTHALMIC | 0 refills | Status: AC

## 2021-11-13 MED ORDER — PREDNISOLONE ACETATE 1 % OP SUSP: 1.0000 [drp] | Freq: Four times a day (QID) | OPHTHALMIC | 0 refills | Status: DC

## 2021-11-13 NOTE — Patient Instructions (Signed)
Preoperative medications reviewed with the patient. ? ?Patient to have ofloxacin and Pred forte 1 drop each 4 times daily 2 days prior to surgery that is Monday, 11-18-2021 ? ?Patient to halt use of aspirin Sunday, 5-21-20231.  GIVE PREOPERATIVE EYE MEDICATION TO PATIENT TO TAKE HOME, AND DO NOT USE UNTIL POSTOPERATIVE VISIT THE NEXT DAY IN DR [facility] OFFICE UNLESS INSTRUCTED BY PHYSICIAN TO START THE NEXT DAY.  ? ?POSITIONS:  (if gas bubble placed into the eye) ? ?A.  Do not sleep or rest on back ? ?B.  Do not travel by airplane, to the mountains, or elevation above 2000 feet ? ?The patient understands the risks of anesthesia including but not limited to the rare occurrence of death. The patient also understands risks and benefits of the planned surgical procedure include but are not limited to hemorrhage, infection, scarring, loss of vision, progression of disease despite intervention, and need for another procedure.  ? ?The patient understands the risks of anesthesia including but not limited to the rare occurrence of death. The patient also understands risks and benefits of the planned surgical procedure include but are not limited to hemorrhage, infection, scarring, loss of vision, progression of disease despite intervention, and need for another procedure.  ? ? ?

## 2021-11-13 NOTE — Progress Notes (Signed)
? ? ?11/13/2021 ? ?  ? ?CHIEF COMPLAINT ?Patient presents for  ?Chief Complaint  ?Patient presents with  ? Retina Evaluation  ? ?Onset vision loss right eye over the last 3 weeks ? ? ?HISTORY OF PRESENT ILLNESS: ?Matthew [name] is a 81 y.o. adult who presents to the clinic today for:  ? ?HPI   ?VMA OD w/ ME, referred by Dr. Midge Aver, last seen 09/16/2021. ?"My vision is distorted in my right eye, it tends to scramble. I have a halo that develops periodically. It seems like there is a dark spot in the center of my right eye. It depends what I am looking at, the spot I can't see through changes colors. This has been going on for 3 weeks." ?Patient is not on any prescription eye drops.  ?Patient denies surgeries or hospitalizations since last visit. ?Last edited by Laurin Coder on 11/13/2021  8:25 AM.  ?  ? ? ?Referring physician: ?Warden Fillers, MD ?[address] ?[address] ?[place],  [place] [phone] ? ?HISTORICAL INFORMATION:  ? ?Selected notes from the [place] ?  ? ?Lab Results  ?Component Value Date  ? HGBA1C 8.6 (H) 10/01/2021  ?  ? ?CURRENT MEDICATIONS: ?Current Outpatient Medications (Ophthalmic Drugs)  ?Medication Sig  ? ofloxacin (OCUFLOX) 0.3 % ophthalmic solution Place 1 drop into the right eye every 4 (four) hours for 10 days.  ? prednisoLONE acetate (PRED FORTE) 1 % ophthalmic suspension Place 1 drop into the right eye 4 (four) times daily.  ? ?No current facility-administered medications for this visit. (Ophthalmic Drugs)  ? ?Current Outpatient Medications (Other)  ?Medication Sig  ? aspirin EC 81 MG tablet Take 81 mg by mouth daily. Swallow whole.  ? atorvastatin (LIPITOR) 40 MG tablet Take 1 tablet by mouth once daily  ? Azelastine HCl 137 MCG/SPRAY SOLN USE 1 SPRAY(S) IN EACH NOSTRIL TWICE DAILY AS DIRECTED  ? chlorthalidone (HYGROTON) 25 MG tablet Take 1 tablet by mouth once daily  ? cholecalciferol (VITAMIN D3) 25 MCG (1000 UNIT) tablet Take 1,000 Units by mouth daily.  ?  colchicine (COLCRYS) 0.6 MG tablet Take 1 tablet (0.6 mg total) by mouth daily.  ? Continuous Blood Gluc Receiver (FREESTYLE LIBRE 14 DAY READER) DEVI 2 each by Does not apply route daily.  ? Continuous Blood Gluc Sensor (FREESTYLE LIBRE 14 DAY SENSOR) MISC 2 each by Does not apply route daily.  ? cyclobenzaprine (FLEXERIL) 5 MG tablet TAKE ONE TABLET BY MOUTH TWICE DAILY AS NEEDED FOR MUSCLE SPASM (Patient taking differently: Take 5 mg by mouth 2 (two) times daily as needed for muscle spasms.)  ? fexofenadine (ALLEGRA) 180 MG tablet Take 180 mg by mouth daily.  ? fluticasone (FLONASE) 50 MCG/ACT nasal spray USE 2 SPRAY(S) IN EACH NOSTRIL ONCE DAILY AS NEEDED  ? metoprolol succinate (TOPROL-XL) 100 MG 24 hr tablet TAKE 1 TABLET EVERY DAY WITH OR IMMMEDIATELY FOLLOWING A MEAL  ? NIFEdipine (PROCARDIA-XL) 30 MG (OSM) 24 hr tablet Take 30 mg by mouth 2 (two) times daily.  ? NOVOLIN 70/30 RELION (70-30) 100 UNIT/ML injection INJECT 22 UNITS INTO THE SKIN IN THE MORNING AND 20 UNITS IN THE EVENING (Patient taking differently: Inject 20-22 Units into the skin See admin instructions. Inject 22 units subcutaneously in the morning & inject 20 units subcutaneously in the evening)  ? oxymetazoline (AFRIN) 0.05 % nasal spray Place 1 spray into both nostrils 2 (two) times daily as needed for congestion.  ? pioglitazone (ACTOS) 30  MG tablet Take 1 tablet by mouth once daily  ? ?No current facility-administered medications for this visit. (Other)  ? ? ? ? ?REVIEW OF SYSTEMS: ?ROS   ?Negative for: Constitutional, Gastrointestinal, Neurological, Skin, Genitourinary, Musculoskeletal, HENT, Endocrine, Cardiovascular, Eyes, Respiratory, Psychiatric, Allergic/Imm, Heme/Lymph ?Last edited by Hurman Horn, MD on 11/13/2021  9:24 AM.  ?  ? ? ? ?ALLERGIES ?Allergies  ?Allergen Reactions  ? Penicillins Hives  ? Probenecid   ?  Developed lump on chest- went away when he stopped the medicine  ? Tetracycline Hcl Hives  ? Allopurinol Itching   ? ? ?PAST MEDICAL HISTORY ?Past Medical History:  ?Diagnosis Date  ? Cataract, nuclear sclerotic, right eye 11/20/2020  ? CLAUDICATION 05/14/2007  ? CORONARY ARTERY DISEASE 12/28/2006  ? Myoview 8/21: EF 73, normal perfusion, low risk   ? 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 Diabetes mellitus type II, controlled ([place]) 12/28/2006  ? Actos '30mg'$ , insulin 70/30 20 units BID, amaryl '1mg'$  previously a1c <8. Worsened due to cold over 5 weeks around 05/2014 eating poorly and eating sugary syrups.   Stomach irritation on metformin.  Lab Results  Component Value Date   HGBA1C 8.7* 06/05/2014       ? DIABETES MELLITUS, TYPE II 12/28/2006  ? Duodenal ulcer   ? Erosive gastritis   ? GERD (gastroesophageal reflux disease)   ? GOUT 12/28/2006  ? Hemorrhoids   ? Hiatal hernia   ? HYPERLIPIDEMIA 12/28/2006  ? HYPERTENSION 12/28/2006  ? LATERAL EPICONDYLITIS, RIGHT 12/11/2009  ? Myocardial infarction [facility])   ? 23 yrs ago per pt  ? Raynaud's disease   ? RENAL FAILURE, CHRONIC 09/15/2008  ? Tubular adenoma of colon 11/2010  ? 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 Vitrectomy, no membrane peel, 12-05-2020  ? ?Past Surgical History:  ?Procedure Laterality Date  ? ABDOMINAL AORTOGRAM W/LOWER EXTREMITY N/A 02/10/2020  ? Procedure: ABDOMINAL AORTOGRAM W/LOWER EXTREMITY;  Surgeon: Elam Dutch, MD;  Location: [facility];  Service: Cardiovascular;  Laterality: N/A;  ? CATARACT EXTRACTION Left 06/21/2020  ? CATARACT EXTRACTION Right   ? COLONOSCOPY    ? CORONARY ARTERY BYPASS GRAFT    ? ENDARTERECTOMY FEMORAL Left 02/27/2020  ? Procedure: LEFT COMMON FEMORAL ENDARTERECTOMY;  Surgeon: Elam Dutch, MD;  Location: [facility] OR;  Service: Vascular;  Laterality: Left;  ? FEMORAL-POPLITEAL BYPASS GRAFT Left 02/27/2020  ? Procedure: LEFT FEMORAL-ABOVE KNEE POPLITEAL BYPASS WITH NON REVERSED GREATER SAPHENOUS VEIN;  Surgeon: Elam Dutch, MD;  Location: [facility];  Service: Vascular;  Laterality: Left;  ? FEMORAL-POPLITEAL BYPASS  GRAFT Right 04/23/2020  ? Procedure: BYPASS GRAFT FEMORAL- Above Knee POPLITEAL ARTERY RIGHT Using Right Greater Saphenous Vein;  Surgeon: Elam Dutch, MD;  Location: [facility];  Service: Vascular;  Laterality: Right;  ? KNEE ARTHROSCOPY  2012  ? UPPER GASTROINTESTINAL ENDOSCOPY    ? ? ?FAMILY HISTORY ?Family History  ?Problem Relation Age of Onset  ? Diabetes Mother   ? Hypertension Mother   ? Stroke Father   ? Colon cancer Neg Hx   ? Esophageal cancer Neg Hx   ? Rectal cancer Neg Hx   ? Stomach cancer Neg Hx   ? ? ?SOCIAL HISTORY ?Social History  ? ?Tobacco Use  ? Smoking status: Former  ?  Types: Cigarettes  ?  Quit date: 07/09/1968  ?  Years since quitting: 53.3  ? Smokeless tobacco: Former  ?  Types: Chew  ?  Quit date:  07/09/1988  ? Tobacco comments:  ?  minimal use x 10 years   ?Vaping Use  ? Vaping Use: Never used  ?Substance Use Topics  ? Alcohol use: No  ?  Comment: last was 6 months  ? Drug use: No  ? ?  ? ?  ? ?OPHTHALMIC EXAM: ? ?Base Eye Exam   ? ? Visual Acuity (ETDRS)   ? ?   Right Left  ? Dist [place] 20/125 -2 20/20 -1  ? Dist ph Mercedes NI   ? ?  ?  ? ? Tonometry (Tonopen, 8:33 AM)   ? ?   Right Left  ? Pressure 12 19  ? ?  ?  ? ? Pupils   ? ?   Pupils APD  ? Right PERRL None  ? Left PERRL None  ? ?  ?  ? ? Visual Fields (Counting fingers)   ? ?   Left Right  ?  Full Full  ? ?  ?  ? ? Extraocular Movement   ? ?   Right Left  ?  Full Full  ? ?  ?  ? ? Neuro/Psych   ? ? Oriented x3: Yes  ? Mood/Affect: Normal  ? ?  ?  ? ? Dilation   ? ? Both eyes: 1.0% Mydriacyl, 2.5% Phenylephrine @ 8:33 AM  ? ?  ?  ? ?  ? ?Slit Lamp and Fundus Exam   ? ? External Exam   ? ?   Right Left  ? External Normal Normal  ? ?  ?  ? ? Slit Lamp Exam   ? ?   Right Left  ? Lids/Lashes Normal Normal  ? Conjunctiva/Sclera White and quiet White and quiet  ? Cornea Clear Clear  ? Anterior Chamber Deep and quiet Deep and quiet  ? Iris Round and reactive Round and reactive  ? Lens Centered posterior chamber intraocular lens Centered  posterior chamber intraocular lens  ? Anterior Vitreous Normal Normal  ? ?  ?  ? ? Fundus Exam   ? ?   Right Left  ? Posterior Vitreous partial pvd, vm traction clear avitric  ? Disc Normal Normal  ? C/D Ratio 0.5 0.5

## 2021-11-13 NOTE — Assessment & Plan Note (Signed)
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

## 2021-11-13 NOTE — Assessment & Plan Note (Signed)
The nature of macular hole was discussed with the patient as well as possibility of second eye involvement at a later date.  The risks of no treatment were reviewed as well, as the possibility of surgical repair and the risks with that modality.  The requirement for postoperative face downward in a reading position was discussed.  The usual period of 3-5 days, positioning while awake, was disclosed.  The patient must not sleep on back.  The patient may sleep on either side after surgery  for approximately  2 weeks.  The alternative surgical repair with vitrectomy and using silicone oil was discussed.   Typical results with oil for this use to repair macular hole are not as good as gas utilization.   With oil use, the need for a second surgery reviewed.   If the patient's operative eye is [place], cataract surgery is very often recommended by referring to cataract surgeon, prior to Vitrectomy. This will allow a clear view of the macular hole  for the repair,  and best visual acuity results ultimately for the patient.  All the patient's questions were answered.  An informational note was given. ? ?Patient understands that vitrectomy and membrane peel the right eye is the beginning of the healing process.  He will be expected for the 3 to 4 days postoperatively while awake to follow the positioning of what is termed the reading or prayer position while he is during his waking hours.  This is a full-time exercise for the first 3 to 4 days post surgery.  Is the most critical part of his surgical procedure and its healing process to allow the macular hole to close.  This will be demonstrated in the office today. ? ? ?

## 2021-11-14 ENCOUNTER — Encounter: Payer: Self-pay | Admitting: Physician Assistant

## 2021-11-14 NOTE — Progress Notes (Signed)
Office Note     CC:  follow up Requesting Provider:  Marin Olp, MD  HPI: Matthew [name] is a 81 y.o. (1940/08/20) adult who presents for bypass graft surveillance of bilateral lower extremities.  Surgical history significant for right femoral to above-the-knee popliteal bypass by Dr. Oneida Alar in October 2021.  He also underwent left femoral endarterectomy and left femoral to above-the-knee popliteal bypass and August 2021 by Dr. Oneida Alar.  He does have bilateral calf claudication after walking 2-3 city blocks.  He is able to rest and repeat the same distance after his symptoms resolved.  He denies any rest pain or tissue loss of bilateral lower extremities.  These symptoms do not impact his quality of life.  He is on aspirin and statin daily.  He is a former smoker.  Past medical history also significant for insulin-dependent diabetes mellitus.   Past Medical History:  Diagnosis Date   Cataract, nuclear sclerotic, right eye 11/20/2020   CLAUDICATION 05/14/2007   CORONARY ARTERY DISEASE 12/28/2006   Myoview 8/21: EF 73, normal perfusion, low risk    Cystoid macular edema of left eye 03/20/2021   Diabetes mellitus type II, controlled ([place]) 12/28/2006   Actos '30mg'$ , insulin 70/30 20 units BID, amaryl '1mg'$  previously K5L <8. Worsened due to cold over 5 weeks around 05/2014 eating poorly and eating sugary syrups.   Stomach irritation on metformin.  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   Vitrectomy, no membrane peel,  12-05-2020    Past Surgical History:  Procedure Laterality Date   ABDOMINAL AORTOGRAM W/LOWER EXTREMITY N/A 02/10/2020   Procedure: ABDOMINAL AORTOGRAM W/LOWER EXTREMITY;  Surgeon: Elam Dutch, MD;  Location: [facility];  Service: Cardiovascular;  Laterality: N/A;   CATARACT EXTRACTION Left 06/21/2020   CATARACT EXTRACTION Right    COLONOSCOPY     CORONARY ARTERY BYPASS GRAFT     ENDARTERECTOMY FEMORAL Left 02/27/2020   Procedure: LEFT COMMON FEMORAL ENDARTERECTOMY;  Surgeon: Elam Dutch, MD;  Location: [facility] OR;  Service: Vascular;  Laterality: Left;   FEMORAL-POPLITEAL BYPASS GRAFT Left 02/27/2020   Procedure: LEFT FEMORAL-ABOVE KNEE POPLITEAL BYPASS WITH NON REVERSED GREATER SAPHENOUS VEIN;  Surgeon: Elam Dutch, MD;  Location: [facility] OR;  Service: Vascular;  Laterality: Left;   FEMORAL-POPLITEAL BYPASS GRAFT Right 04/23/2020   Procedure: BYPASS GRAFT FEMORAL- Above Knee POPLITEAL ARTERY RIGHT Using Right Greater Saphenous Vein;  Surgeon: Elam Dutch, MD;  Location: [facility] OR;  Service: Vascular;  Laterality: Right;   KNEE ARTHROSCOPY  2012   UPPER GASTROINTESTINAL ENDOSCOPY      Social History   Socioeconomic History   Marital status: Married    Spouse name: Not on file   Number of children: 2   Years of education: Not on file   Highest education level: Not on file  Occupational History   Occupation: retired  Tobacco Use   Smoking status: Former  Types: Cigarettes    Quit date: 07/09/1968    Years since quitting: 53.3    Passive exposure: Never   Smokeless tobacco: Former    Types: Chew    Quit date: 07/09/1988   Tobacco comments:    minimal use x 10 years   Vaping Use   Vaping Use: Never used  Substance and Sexual Activity   Alcohol use: No    Comment: last was 6 months   Drug use: No   Sexual activity: Not on file  Other Topics Concern   Not on file  Social History Narrative   Married 1983. 2 sons. No grandkids yet.       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       Hobbies: volunteer work, watch sports   Social Determinants of Radio broadcast assistant Strain: Not on file  Food Insecurity: Not on file  Transportation Needs: Not on file  Physical Activity: Not on file  Stress: Not on file  Social Connections: Not on file  Intimate Partner Violence: Not on file    Family History  Problem Relation Age of Onset   Diabetes Mother    Hypertension Mother    Stroke Father    Colon cancer Neg Hx    Esophageal cancer Neg Hx    Rectal cancer Neg Hx    Stomach cancer Neg Hx     Current Outpatient Medications  Medication Sig Dispense Refill   aspirin EC 81 MG tablet Take 81 mg by mouth daily. Swallow whole.     atorvastatin (LIPITOR) 40 MG tablet Take 1 tablet by mouth once daily 90 tablet 0   Azelastine HCl 137 MCG/SPRAY SOLN USE 1 SPRAY(S) IN EACH NOSTRIL TWICE DAILY AS DIRECTED 30 mL 0   chlorthalidone (HYGROTON) 25 MG tablet Take 1 tablet by mouth once daily 90 tablet 0   cholecalciferol (VITAMIN D3) 25 MCG (1000 UNIT) tablet Take 1,000 Units by mouth daily.     colchicine (COLCRYS) 0.6 MG tablet Take 1 tablet (0.6 mg total) by mouth daily. 30 tablet 2   Continuous Blood Gluc Receiver (FREESTYLE LIBRE 14 DAY READER) DEVI 2 each by Does not apply route daily. 2 each 11   Continuous Blood Gluc Sensor (FREESTYLE LIBRE 14 DAY SENSOR) MISC 2 each by Does not apply route daily. 2 each 11   cyclobenzaprine (FLEXERIL) 5 MG tablet TAKE ONE TABLET BY MOUTH TWICE DAILY AS NEEDED FOR MUSCLE SPASM (Patient taking differently: Take 5 mg by mouth 2 (two) times daily as needed for muscle spasms.) 30 tablet 0   fexofenadine (ALLEGRA) 180 MG tablet Take 180 mg by mouth daily.     fluticasone (FLONASE) 50 MCG/ACT nasal spray USE 2 SPRAY(S) IN EACH NOSTRIL ONCE DAILY AS NEEDED 16 g 0   metoprolol succinate (TOPROL-XL) 100 MG 24 hr tablet TAKE 1 TABLET EVERY DAY WITH  OR IMMMEDIATELY FOLLOWING A MEAL 90 tablet 1   NIFEdipine (PROCARDIA-XL) 30 MG (OSM) 24 hr tablet Take 30 mg by mouth 2 (two) times daily.     NOVOLIN 70/30 RELION (70-30) 100 UNIT/ML injection INJECT 22 UNITS INTO THE SKIN IN THE MORNING AND 20 UNITS IN THE EVENING (Patient taking differently: Inject 20-22 Units into the skin See admin instructions. Inject 22 units subcutaneously in the morning & inject 20 units subcutaneously in the evening) 10 mL 0   ofloxacin (OCUFLOX) 0.3 % ophthalmic solution Place 1 drop into  the right eye every 4 (four) hours for 10 days. 5 mL 0   oxymetazoline (AFRIN) 0.05 % nasal spray Place 1 spray into both nostrils 2 (two) times daily as needed for congestion.     pioglitazone (ACTOS) 30 MG tablet Take 1 tablet by mouth once daily 90 tablet 0   prednisoLONE acetate (PRED FORTE) 1 % ophthalmic suspension Place 1 drop into the right eye 4 (four) times daily. 10 mL 0   No current facility-administered medications for this visit.    Allergies  Allergen Reactions   Penicillins Hives   Probenecid     Developed lump on chest- went away when he stopped the medicine   Tetracycline Hcl Hives   Allopurinol Itching     REVIEW OF SYSTEMS:   '[X]'$  denotes positive finding, '[ ]'$  denotes negative finding Cardiac  Comments:  Chest pain or chest pressure:    Shortness of breath upon exertion:    Short of breath when lying flat:    Irregular heart rhythm:        Vascular    Pain in calf, thigh, or hip brought on by ambulation:    Pain in feet at night that wakes you up from your sleep:     Blood clot in your veins:    Leg swelling:         Pulmonary    Oxygen at home:    Productive cough:     Wheezing:         Neurologic    Sudden weakness in arms or legs:     Sudden numbness in arms or legs:     Sudden onset of difficulty speaking or slurred speech:    Temporary loss of vision in one eye:     Problems with dizziness:         Gastrointestinal    Blood in  stool:     Vomited blood:         Genitourinary    Burning when urinating:     Blood in urine:        Psychiatric    Major depression:         Hematologic    Bleeding problems:    Problems with blood clotting too easily:        Skin    Rashes or ulcers:        Constitutional    Fever or chills:      PHYSICAL EXAMINATION:  Vitals:   11/13/21 1515  BP: (!) 149/65  Pulse: (!) 51  Resp: 20  Temp: 98.2 F (36.8 C)  TempSrc: Temporal  SpO2: 99%  Weight: 198 lb (89.8 kg)  Height: 6' (1.829 m)    General:  WDWN in NAD; vital signs documented above Gait: Not observed HENT: WNL, normocephalic Pulmonary: normal non-labored breathing , without Rales, rhonchi,  wheezing Cardiac: regular HR Abdomen: soft, NT, no masses Skin: without rashes Vascular Exam/Pulses:  Right Left  Radial 2+ (normal) 2+ (normal)  DP 2+ (normal) 2+ (normal)   Extremities: without ischemic changes, without Gangrene , without cellulitis; without open wounds;  Musculoskeletal: no muscle wasting or atrophy  Neurologic: A&O X 3;  No focal weakness or paresthesias are detected Psychiatric:  The pt has Normal affect.   Non-Invasive Vascular Imaging:   Bilateral lower extremity bypass grafts are patent Right leg bypass graft with 240 cm/s in the mid graft  Left leg bypass with 241 cm/s in the distal anastomosis  ABI/TBIToday's ABIToday's TBIPrevious ABIPrevious  TBI  +-------+-----------+-----------+------------+------------+  Right  0.99       0.75       0.96        0.62          +-------+-----------+-----------+------------+------------+  Left   1.01       0.69       0.94        0.74         ASSESSMENT/PLAN:: 81 y.o. adult here for follow up for surveillance of bypass grafts of bilateral lower extremities  -Patient continues to have bilateral calf claudication after walking about 2-3 city blocks.  He states that the symptoms are tolerable and do not affect his quality of  life. -Duplex demonstrates patent bypass grafts.  There are mild velocity elevations however nothing of hemodynamic significance at this time.  Plan will be for repeat surveillance in 1 year.  Patient knows to call/return office sooner with any questions or concerns.  It should also be noted that he was encouraged to walk daily to improve distance prior to claudication symptom onset.   Dagoberto Ligas, PA-C [facility] [phone]  Clinic MD:   Donzetta Matters

## 2021-11-16 DIAGNOSIS — M25511 Pain in right shoulder: Secondary | ICD-10-CM | POA: Diagnosis not present

## 2021-11-18 DIAGNOSIS — M25511 Pain in right shoulder: Secondary | ICD-10-CM | POA: Diagnosis not present

## 2021-11-20 ENCOUNTER — Encounter (AMBULATORY_SURGERY_CENTER): Payer: Medicare Other | Admitting: Ophthalmology

## 2021-11-20 DIAGNOSIS — H35341 Macular cyst, hole, or pseudohole, right eye: Secondary | ICD-10-CM | POA: Diagnosis not present

## 2021-11-20 DIAGNOSIS — H43821 Vitreomacular adhesion, right eye: Secondary | ICD-10-CM | POA: Diagnosis not present

## 2021-11-21 ENCOUNTER — Ambulatory Visit (INDEPENDENT_AMBULATORY_CARE_PROVIDER_SITE_OTHER): Payer: Medicare Other | Admitting: Ophthalmology

## 2021-11-21 ENCOUNTER — Encounter (INDEPENDENT_AMBULATORY_CARE_PROVIDER_SITE_OTHER): Payer: Self-pay | Admitting: Ophthalmology

## 2021-11-21 DIAGNOSIS — H35341 Macular cyst, hole, or pseudohole, right eye: Secondary | ICD-10-CM

## 2021-11-21 DIAGNOSIS — H43821 Vitreomacular adhesion, right eye: Secondary | ICD-10-CM

## 2021-11-21 NOTE — Assessment & Plan Note (Signed)
Vitrectomy membrane peel postop day #1

## 2021-11-21 NOTE — Progress Notes (Signed)
11/21/2021     CHIEF COMPLAINT Patient presents for  Chief Complaint  Patient presents with   Post-op Follow-up      HISTORY OF PRESENT ILLNESS: Matthew [name] is a 81 y.o. adult who presents to the clinic today for:   HPI     Post-op Follow-up           Laterality: right eye   Discomfort: itching.  Negative for pain, foreign body sensation, tearing, discharge and floaters         Comments   1 day post op, OD, SX 11/20/2021, vit ilm peel gas inj for VMT and MACULAR HOLE  Pt stated, "if I close my left eye, I see grayness and some squiggly lines. I see the grayness all around my vision." Pt reports it is too early to tell how vision is. Pt denies floaters and FOL but reports seeing a"halo" like figure in operative eye.       Last edited by Hurman Horn, MD on 11/21/2021 10:57 AM.      Referring physician: Marin Olp, MD [place],  [place] [address]  HISTORICAL INFORMATION:   Selected notes from the MEDICAL RECORD NUMBER    Lab Results  Component Value Date   HGBA1C 8.6 (H) 10/01/2021     CURRENT MEDICATIONS: Current Outpatient Medications (Ophthalmic Drugs)  Medication Sig   ofloxacin (OCUFLOX) 0.3 % ophthalmic solution Place 1 drop into the right eye every 4 (four) hours for 10 days.   prednisoLONE acetate (PRED FORTE) 1 % ophthalmic suspension Place 1 drop into the right eye 4 (four) times daily.   No current facility-administered medications for this visit. (Ophthalmic Drugs)   Current Outpatient Medications (Other)  Medication Sig   aspirin EC 81 MG tablet Take 81 mg by mouth daily. Swallow whole.   atorvastatin (LIPITOR) 40 MG tablet Take 1 tablet by mouth once daily   Azelastine HCl 137 MCG/SPRAY SOLN USE 1 SPRAY(S) IN EACH NOSTRIL TWICE DAILY AS DIRECTED   chlorthalidone (HYGROTON) 25 MG tablet Take 1 tablet by mouth once daily   cholecalciferol (VITAMIN D3) 25 MCG (1000 UNIT) tablet Take 1,000 Units by mouth daily.    colchicine (COLCRYS) 0.6 MG tablet Take 1 tablet (0.6 mg total) by mouth daily.   Continuous Blood Gluc Receiver (FREESTYLE LIBRE 14 DAY READER) DEVI 2 each by Does not apply route daily.   Continuous Blood Gluc Sensor (FREESTYLE LIBRE 14 DAY SENSOR) MISC 2 each by Does not apply route daily.   cyclobenzaprine (FLEXERIL) 5 MG tablet TAKE ONE TABLET BY MOUTH TWICE DAILY AS NEEDED FOR MUSCLE SPASM (Patient taking differently: Take 5 mg by mouth 2 (two) times daily as needed for muscle spasms.)   fexofenadine (ALLEGRA) 180 MG tablet Take 180 mg by mouth daily.   fluticasone (FLONASE) 50 MCG/ACT nasal spray USE 2 SPRAY(S) IN EACH NOSTRIL ONCE DAILY AS NEEDED   metoprolol succinate (TOPROL-XL) 100 MG 24 hr tablet TAKE 1 TABLET EVERY DAY WITH OR IMMMEDIATELY FOLLOWING A MEAL   NIFEdipine (PROCARDIA-XL) 30 MG (OSM) 24 hr tablet Take 30 mg by mouth 2 (two) times daily.   NOVOLIN 70/30 RELION (70-30) 100 UNIT/ML injection INJECT 22 UNITS INTO THE SKIN IN THE MORNING AND 20 UNITS IN THE EVENING (Patient taking differently: Inject 20-22 Units into the skin See admin instructions. Inject 22 units subcutaneously in the morning & inject 20 units subcutaneously in the evening)   oxymetazoline (AFRIN) 0.05 % nasal spray  Place 1 spray into both nostrils 2 (two) times daily as needed for congestion.   pioglitazone (ACTOS) 30 MG tablet Take 1 tablet by mouth once daily   No current facility-administered medications for this visit. (Other)      REVIEW OF SYSTEMS: ROS   Negative for: Constitutional, Gastrointestinal, Neurological, Skin, Genitourinary, Musculoskeletal, HENT, Endocrine, Cardiovascular, Eyes, Respiratory, Psychiatric, Allergic/Imm, Heme/Lymph Last edited by Silvestre Moment on 11/21/2021 10:27 AM.       ALLERGIES Allergies  Allergen Reactions   Penicillins Hives   Probenecid     Developed lump on chest- went away when he stopped the medicine   Tetracycline Hcl Hives   Allopurinol Itching    PAST  MEDICAL HISTORY Past Medical History:  Diagnosis Date   Cataract, nuclear sclerotic, right eye 11/20/2020   CLAUDICATION 05/14/2007   CORONARY ARTERY DISEASE 12/28/2006   Myoview 8/21: EF 73, normal perfusion, low risk    Cystoid macular edema of left eye 03/20/2021   Diabetes mellitus type II, controlled ([place]) 12/28/2006   Actos '30mg'$ , insulin 70/30 20 units BID, amaryl '1mg'$  previously a1c <8. Worsened due to cold over 5 weeks around 05/2014 eating poorly and eating sugary syrups.   Stomach irritation on metformin.  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   Vitrectomy, no membrane peel, 12-05-2020   Past Surgical History:  Procedure Laterality Date   ABDOMINAL AORTOGRAM W/LOWER EXTREMITY N/A 02/10/2020   Procedure: ABDOMINAL AORTOGRAM W/LOWER EXTREMITY;  Surgeon: Elam Dutch, MD;  Location: [facility];  Service: Cardiovascular;  Laterality: N/A;   CATARACT EXTRACTION Left 06/21/2020   CATARACT EXTRACTION Right    COLONOSCOPY     CORONARY ARTERY BYPASS GRAFT     ENDARTERECTOMY FEMORAL Left 02/27/2020   Procedure: LEFT COMMON FEMORAL ENDARTERECTOMY;  Surgeon: Elam Dutch, MD;  Location: [facility];  Service: Vascular;  Laterality: Left;   FEMORAL-POPLITEAL BYPASS GRAFT Left 02/27/2020   Procedure: LEFT FEMORAL-ABOVE KNEE POPLITEAL BYPASS WITH NON REVERSED GREATER SAPHENOUS VEIN;  Surgeon: Elam Dutch, MD;  Location: [facility];  Service: Vascular;  Laterality: Left;   FEMORAL-POPLITEAL BYPASS GRAFT  Right 04/23/2020   Procedure: BYPASS GRAFT FEMORAL- Above Knee POPLITEAL ARTERY RIGHT Using Right Greater Saphenous Vein;  Surgeon: Elam Dutch, MD;  Location: [facility] OR;  Service: Vascular;  Laterality: Right;   KNEE ARTHROSCOPY  2012   UPPER GASTROINTESTINAL ENDOSCOPY      FAMILY HISTORY Family History  Problem Relation Age of Onset   Diabetes Mother    Hypertension Mother    Stroke Father    Colon cancer Neg Hx    Esophageal cancer Neg Hx    Rectal cancer Neg Hx    Stomach cancer Neg Hx     SOCIAL HISTORY Social History   Tobacco Use   Smoking status: Former    Types: Cigarettes    Quit date: 07/09/1968    Years since quitting:  53.4    Passive exposure: Never   Smokeless tobacco: Former    Types: Chew    Quit date: 07/09/1988   Tobacco comments:    minimal use x 10 years   Vaping Use   Vaping Use: Never used  Substance Use Topics   Alcohol use: No    Comment: last was 6 months   Drug use: No         OPHTHALMIC EXAM:  Base Eye Exam     Visual Acuity (ETDRS)       Right Left   Dist [place] HM 20/20         Tonometry (Tonopen, 10:33 AM)       Right Left   Pressure 12 10         Pupils       Pupils APD   Right PERRL None   Left PERRL None         Visual Fields       Left Right    Full          Extraocular Movement       Right Left    Full Full         Neuro/Psych     Oriented x3: Yes   Mood/Affect: Normal         Dilation     Right eye: 2.5% Phenylephrine, 1.0% Mydriacyl @ 10:32 AM           Slit Lamp and Fundus Exam     External Exam       Right Left   External Normal Normal         Slit Lamp Exam       Right Left   Lids/Lashes Normal Normal   Conjunctiva/Sclera White and quiet White and quiet   Cornea Clear Clear   Anterior Chamber Deep and quiet Deep and quiet   Iris Round and reactive Round and reactive   Lens Centered posterior chamber intraocular lens Centered posterior chamber intraocular lens    Anterior Vitreous Normal Normal         Fundus Exam       Right Left   Posterior Vitreous partial pvd, vm traction clear avitric   Disc Normal Normal   C/D Ratio 0.5 0.5   Macula Macular hole, Normal   Vessels Normal Normal   Periphery Normal Normal            IMAGING AND PROCEDURES  Imaging and Procedures for 11/21/21           ASSESSMENT/PLAN:  Full thickness macular hole, right Vitrectomy membrane peel postop day #1     ICD-10-CM   1. 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 Full thickness macular hole, right  H35.341       1.  OD looks great postoperative day #1.  2.  Discussion of the typical progression of the gas dissolving and seeing a shimmering in his line of vision initially above the center of the vision finally in the center of the vision and finally below the center of the vision.  This is classic and typical.  3.  Ophthalmic Meds Ordered this visit:  No orders of the defined types were placed in this encounter.      Return in about 11 days (around 12/02/2021) for OD, POST OP, OCT.  Patient Instructions  Macular hole surgery was explained with the need for a gas injection. The patient is aware of  proper face down position (like looking downward naturally while  reading a book in a lap) for 3-5 days. Patient was advised to not lay on their back while sleeping or resting, usually for 2 weeks. Do not travel to places of elevation while gas bubble is in the eye, usually for 2 weeks  Patient instructed to maintain use of the green bracelet for as long as the gas bubble is in the eye usually 2 weeks in this case   Patient instructed in reading position during his waking hours and to avoid sleeping or resting on his back for prolonged periods of time  May look up to apply as drops  Medications   ofloxacin 1 drop right eye 4 times daily, tan color bottle top  Prednisolone acetate 1 drop right eye 4 times daily, pink color  bottle top   Explained the diagnoses, plan, and follow up with the patient and they expressed understanding.  Patient expressed understanding of the importance of proper follow up care.   Clent Demark Calieb Lichtman M.D. Diseases & Surgery of the Retina and Vitreous [facility] [place] 11/21/21     Abbreviations: M myopia (nearsighted); A astigmatism; H hyperopia (farsighted); P presbyopia; Mrx spectacle prescription;  CTL contact lenses; OD right eye; OS left eye; OU both eyes  XT exotropia; ET esotropia; PEK punctate epithelial keratitis; PEE punctate epithelial erosions; DES dry eye syndrome; MGD meibomian gland dysfunction; ATs artificial tears; PFAT's preservative free artificial tears; [place] nuclear sclerotic cataract; PSC posterior subcapsular cataract; ERM epi-retinal membrane; PVD posterior vitreous detachment; RD retinal detachment; DM diabetes mellitus; DR diabetic retinopathy; NPDR non-proliferative diabetic retinopathy; PDR proliferative diabetic retinopathy; CSME clinically significant macular edema; DME diabetic macular edema; dbh dot blot hemorrhages; CWS cotton wool spot; POAG primary open angle glaucoma; C/D cup-to-disc ratio; HVF humphrey visual field; GVF goldmann visual field; OCT optical coherence tomography; IOP intraocular pressure; BRVO Branch retinal vein occlusion; CRVO central retinal vein occlusion; CRAO central retinal artery occlusion; BRAO branch retinal artery occlusion; RT retinal tear; SB scleral buckle; PPV pars plana vitrectomy; VH Vitreous hemorrhage; PRP panretinal laser photocoagulation; IVK intravitreal kenalog; VMT vitreomacular traction; MH Macular hole;  NVD neovascularization of the disc; NVE neovascularization elsewhere; AREDS age related eye disease study; ARMD age related macular degeneration; POAG primary open angle glaucoma; EBMD epithelial/anterior basement membrane dystrophy; ACIOL anterior chamber intraocular lens; IOL intraocular lens; PCIOL posterior  chamber intraocular lens; Phaco/IOL phacoemulsification with intraocular lens placement; [place] photorefractive keratectomy; LASIK laser assisted in situ keratomileusis; HTN hypertension; DM diabetes mellitus; COPD chronic obstructive pulmonary disease

## 2021-11-21 NOTE — Patient Instructions (Signed)
Macular hole surgery was explained with the need for a gas injection. The patient is aware of proper face down position (like looking downward naturally while  reading a book in a lap) for 3-5 days. Patient was advised to not lay on their back while sleeping or resting, usually for 2 weeks. Do not travel to places of elevation while gas bubble is in the eye, usually for 2 weeks  Patient instructed to maintain use of the green bracelet for as long as the gas bubble is in the eye usually 2 weeks in this case   Patient instructed in reading position during his waking hours and to avoid sleeping or resting on his back for prolonged periods of time  May look up to apply as drops  Medications   ofloxacin 1 drop right eye 4 times daily, tan color bottle top  Prednisolone acetate 1 drop right eye 4 times daily, pink color bottle top  

## 2021-11-25 ENCOUNTER — Other Ambulatory Visit: Payer: Self-pay | Admitting: Family Medicine

## 2021-11-30 ENCOUNTER — Other Ambulatory Visit: Payer: Self-pay | Admitting: Family Medicine

## 2021-12-02 ENCOUNTER — Encounter (INDEPENDENT_AMBULATORY_CARE_PROVIDER_SITE_OTHER): Payer: Medicare Other | Admitting: Ophthalmology

## 2021-12-03 ENCOUNTER — Ambulatory Visit (INDEPENDENT_AMBULATORY_CARE_PROVIDER_SITE_OTHER): Payer: Medicare Other | Admitting: Ophthalmology

## 2021-12-03 ENCOUNTER — Encounter (INDEPENDENT_AMBULATORY_CARE_PROVIDER_SITE_OTHER): Payer: Self-pay | Admitting: Ophthalmology

## 2021-12-03 DIAGNOSIS — H35341 Macular cyst, hole, or pseudohole, right eye: Secondary | ICD-10-CM

## 2021-12-03 DIAGNOSIS — H43821 Vitreomacular adhesion, right eye: Secondary | ICD-10-CM | POA: Diagnosis not present

## 2021-12-03 NOTE — Progress Notes (Signed)
12/03/2021     CHIEF COMPLAINT Patient presents for  Chief Complaint  Patient presents with   Post-op Follow-up      HISTORY OF PRESENT ILLNESS: Matthew [name] is a 81 y.o. adult who presents to the clinic today for:   HPI     Post-op Follow-up           Laterality: right eye   Discomfort: pain.  Negative for itching, foreign body sensation, tearing, discharge and floaters   Vision: is stable         Comments   11 days for OD, POST OP, OCT. SX. 11/20/21 Pt stated, "I am experiencing sharp pain when I make any sudden movements. From a vision standpoint, I can see. Form the top to bottom. I can see well on the chart with both eyes. But if I close my left eye and look through my right eye, it looks like scrambled eggs. When I look down, I noticed a flutter as if Im at the beach and the words become wavy." Pt states not having any depth perception. Pt denies FOL but states floaters started about 2-3 days ago.          Last edited by Silvestre Moment on 12/03/2021  2:04 PM.      Referring physician: Marin Olp, MD [place],  [place] [address]  HISTORICAL INFORMATION:   Selected notes from the MEDICAL RECORD NUMBER    Lab Results  Component Value Date   HGBA1C 8.6 (H) 10/01/2021     CURRENT MEDICATIONS: Current Outpatient Medications (Ophthalmic Drugs)  Medication Sig   prednisoLONE acetate (PRED FORTE) 1 % ophthalmic suspension Place 1 drop into the right eye 4 (four) times daily.   No current facility-administered medications for this visit. (Ophthalmic Drugs)   Current Outpatient Medications (Other)  Medication Sig   aspirin EC 81 MG tablet Take 81 mg by mouth daily. Swallow whole.   atorvastatin (LIPITOR) 40 MG tablet Take 1 tablet by mouth once daily   Azelastine HCl 137 MCG/SPRAY SOLN USE 1 SPRAY(S) IN EACH NOSTRIL TWICE DAILY AS DIRECTED   chlorthalidone (HYGROTON) 25 MG tablet Take 1 tablet by mouth once daily   cholecalciferol  (VITAMIN D3) 25 MCG (1000 UNIT) tablet Take 1,000 Units by mouth daily.   colchicine (COLCRYS) 0.6 MG tablet Take 1 tablet (0.6 mg total) by mouth daily.   Continuous Blood Gluc Receiver (FREESTYLE LIBRE 14 DAY READER) DEVI 2 each by Does not apply route daily.   Continuous Blood Gluc Sensor (FREESTYLE LIBRE 14 DAY SENSOR) MISC 2 each by Does not apply route daily.   cyclobenzaprine (FLEXERIL) 5 MG tablet TAKE ONE TABLET BY MOUTH TWICE DAILY AS NEEDED FOR MUSCLE SPASM (Patient taking differently: Take 5 mg by mouth 2 (two) times daily as needed for muscle spasms.)   fexofenadine (ALLEGRA) 180 MG tablet Take 180 mg by mouth daily.   fluticasone (FLONASE) 50 MCG/ACT nasal spray USE 2 SPRAY(S) IN EACH NOSTRIL ONCE DAILY AS NEEDED   metoprolol succinate (TOPROL-XL) 100 MG 24 hr tablet TAKE 1 TABLET EVERY DAY WITH OR IMMMEDIATELY FOLLOWING A MEAL   NIFEdipine (PROCARDIA-XL) 30 MG (OSM) 24 hr tablet Take 30 mg by mouth 2 (two) times daily.   NOVOLIN 70/30 RELION (70-30) 100 UNIT/ML injection INJECT 22 UNITS INTO THE SKIN IN THE MORNING AND 20 UNITS IN THE EVENING (Patient taking differently: Inject 20-22 Units into the skin See admin instructions. Inject 22 units subcutaneously in  the morning & inject 20 units subcutaneously in the evening)   oxymetazoline (AFRIN) 0.05 % nasal spray Place 1 spray into both nostrils 2 (two) times daily as needed for congestion.   pioglitazone (ACTOS) 30 MG tablet Take 1 tablet by mouth once daily   No current facility-administered medications for this visit. (Other)      REVIEW OF SYSTEMS: ROS   Negative for: Constitutional, Gastrointestinal, Neurological, Skin, Genitourinary, Musculoskeletal, HENT, Endocrine, Cardiovascular, Eyes, Respiratory, Psychiatric, Allergic/Imm, Heme/Lymph Last edited by Silvestre Moment on 12/03/2021  1:52 PM.       ALLERGIES Allergies  Allergen Reactions   Penicillins Hives   Probenecid     Developed lump on chest- went away when he stopped  the medicine   Tetracycline Hcl Hives   Allopurinol Itching    PAST MEDICAL HISTORY Past Medical History:  Diagnosis Date   Cataract, nuclear sclerotic, right eye 11/20/2020   CLAUDICATION 05/14/2007   CORONARY ARTERY DISEASE 12/28/2006   Myoview 8/21: EF 73, normal perfusion, low risk    Cystoid macular edema of left eye 03/20/2021   Diabetes mellitus type II, controlled ([place]) 12/28/2006   Actos 79m, insulin 70/30 20 units BID, amaryl 160mpreviously a1c <8. Worsened due to cold over 5 weeks around 05/2014 eating poorly and eating sugary syrups.   Stomach irritation on metformin.  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   Vitrectomy 11/21/2021   Past Surgical History:  Procedure Laterality Date   ABDOMINAL AORTOGRAM W/LOWER EXTREMITY N/A 02/10/2020   Procedure: ABDOMINAL AORTOGRAM W/LOWER EXTREMITY;  Surgeon: FiElam DutchMD;  [facility];  Service: Cardiovascular;  Laterality: N/A;   CATARACT EXTRACTION Left 06/21/2020   CATARACT EXTRACTION Right    COLONOSCOPY     CORONARY ARTERY BYPASS GRAFT     ENDARTERECTOMY FEMORAL Left 02/27/2020   Procedure: LEFT COMMON FEMORAL ENDARTERECTOMY;  Surgeon: FiElam DutchMD;  [facility];  Service: Vascular;  Laterality: Left;   FEMORAL-POPLITEAL BYPASS GRAFT Left 02/27/2020   Procedure: LEFT FEMORAL-ABOVE KNEE POPLITEAL BYPASS WITH NON  REVERSED GREATER SAPHENOUS VEIN;  Surgeon: FiElam DutchMD;  [facility];  Service: Vascular;  Laterality: Left;   FEMORAL-POPLITEAL BYPASS GRAFT Right 04/23/2020   Procedure: BYPASS GRAFT FEMORAL- Above Knee POPLITEAL ARTERY RIGHT Using Right Greater Saphenous Vein;  Surgeon: FiElam DutchMD;  [facility];  Service: Vascular;  Laterality: Right;   KNEE ARTHROSCOPY  2012   UPPER GASTROINTESTINAL ENDOSCOPY      FAMILY HISTORY Family History  Problem Relation Age of Onset   Diabetes Mother    Hypertension Mother    Stroke Father    Colon cancer Neg Hx    Esophageal cancer Neg Hx    Rectal cancer Neg Hx    Stomach cancer Neg Hx  SOCIAL HISTORY Social History   Tobacco Use   Smoking status: Former    Types: Cigarettes    Quit date: 07/09/1968    Years since quitting: 53.4    Passive exposure: Never   Smokeless tobacco: Former    Types: Chew    Quit date: 07/09/1988   Tobacco comments:    minimal use x 10 years   Vaping Use   Vaping Use: Never used  Substance Use Topics   Alcohol use: No    Comment: last was 6 months   Drug use: No         OPHTHALMIC EXAM:  Base Eye Exam     Visual Acuity (ETDRS)       Right Left   Dist [place] 20/80 20/20 -1   Dist ph [place] 20/50          Tonometry (Tonopen, 1:59 PM)       Right Left   Pressure 21 13         Pupils       Pupils APD   Right PERRL None   Left PERRL          Visual Fields       Left Right    Full Full         Extraocular Movement       Right Left    Full Full         Neuro/Psych     Oriented x3: Yes   Mood/Affect: Normal         Dilation     Right eye: 2.5% Phenylephrine, 1.0% Mydriacyl @ 1:59 PM           Slit Lamp and Fundus Exam     External Exam       Right Left   External Normal Normal         Slit Lamp Exam       Right Left   Lids/Lashes Normal Normal   Conjunctiva/Sclera White and quiet White and quiet   Cornea Clear Clear    Anterior Chamber Deep and quiet Deep and quiet   Iris Round and reactive Round and reactive   Lens Centered posterior chamber intraocular lens Centered posterior chamber intraocular lens   Anterior Vitreous Normal Normal         Fundus Exam       Right Left   Posterior Vitreous Clear, avitric, 20% gas clear avitric   Disc Normal Normal   C/D Ratio 0.5 0.5   Macula Macular hole is closed 20 D exam, neg Watzke sign Normal   Vessels Normal Normal   Periphery Normal Normal            IMAGING AND PROCEDURES  Imaging and Procedures for 12/03/21  OCT, Retina - OU - Both Eyes       Right Eye Quality was good. Scan locations included subfoveal. Central Foveal Thickness: 337. Progression has been stable. Findings include abnormal foveal contour, vitreous traction, vitreomacular adhesion .   Left Eye Quality was good. Scan locations included subfoveal. Central Foveal Thickness: 308. Progression has improved. Findings include abnormal foveal contour, cystoid macular edema.   Notes OD, macular hole now closed.  Some 11 days postsurgical repair via vitrectomy membrane peel gas injection   OS, vastly improved 6 months post vitrectomy for VMT with inner and outer foveal distortion and vision loss..  Outer photoreceptor layer now normal without serous fluid.  Small regional noncentral involved CME on temporal edge of  FAZ newly present.  This is most likely MAC-TEL OS, pt denies having sleep apnea             ASSESSMENT/PLAN:  Full thickness macular hole, right Vastly improved macular findings, OCT and improving acuity some 11 days post vitrectomy membrane peel gas injection for macular hole full-thickness  Vitreomacular traction syndrome of right eye Condition resolved post vitrectomy     ICD-10-CM   1. Vitreomacular traction syndrome of right eye  H43.821 OCT, Retina - OU - Both Eyes    2. Full thickness macular hole, right  H35.341       1.  OD looks great, macular  hole now closed.  Outer subretinal fluid remains as this was not drained at the time of surgical  In order to prevent damage directly to the RPE.  Likely to improve slowly over time  2.  3.  Ophthalmic Meds Ordered this visit:  No orders of the defined types were placed in this encounter.      Return in about 3 months (around 03/05/2022) for DILATE OU, OCT, COLOR FP.  Patient Instructions  Ofloxacin  4 times daily to the operative eye  Prednisolone acetate 1 drop to the operative eye 4 times daily  Patient instructed not to refill the medications and use them for maximum of 3 weeks.  Patient instructed do not rub the eye.  Patient has the option to use the patch at night.  Patient to discontinue topical medications in the right eye in 9 days or if the medications are complete prior to that time.  Do not refill the medications  Patient to avoid mountain climbing or elevations for the next 6-day   Explained the diagnoses, plan, and follow up with the patient and they expressed understanding.  Patient expressed understanding of the importance of proper follow up care.   Matthew [name] M.D. Diseases & Surgery of the Retina and Vitreous [facility]ina [facility] 12/03/21     Abbreviations: M myopia (nearsighted); A astigmatism; H hyperopia (farsighted); P presbyopia; Mrx spectacle prescription;  CTL contact lenses; OD right eye; OS left eye; OU both eyes  XT exotropia; ET esotropia; PEK punctate epithelial keratitis; PEE punctate epithelial erosions; DES dry eye syndrome; MGD meibomian gland dysfunction; ATs artificial tears; PFAT's preservative free artificial tears; [place] nuclear sclerotic cataract; PSC posterior subcapsular cataract; ERM epi-retinal membrane; PVD posterior vitreous detachment; RD retinal detachment; DM diabetes mellitus; DR diabetic retinopathy; NPDR non-proliferative diabetic retinopathy; PDR proliferative diabetic retinopathy; CSME clinically significant  macular edema; DME diabetic macular edema; dbh dot blot hemorrhages; CWS cotton wool spot; POAG primary open angle glaucoma; C/D cup-to-disc ratio; HVF humphrey visual field; GVF goldmann visual field; OCT optical coherence tomography; IOP intraocular pressure; BRVO Branch retinal vein occlusion; CRVO central retinal vein occlusion; CRAO central retinal artery occlusion; BRAO branch retinal artery occlusion; RT retinal tear; SB scleral buckle; PPV pars plana vitrectomy; VH Vitreous hemorrhage; PRP panretinal laser photocoagulation; IVK intravitreal kenalog; VMT vitreomacular traction; MH Macular hole;  NVD neovascularization of the disc; NVE neovascularization elsewhere; AREDS age related eye disease study; ARMD age related macular degeneration; POAG primary open angle glaucoma; EBMD epithelial/anterior basement membrane dystrophy; ACIOL anterior chamber intraocular lens; IOL intraocular lens; PCIOL posterior chamber intraocular lens; Phaco/IOL phacoemulsification with intraocular lens placement; [place] photorefractive keratectomy; LASIK laser assisted in situ keratomileusis; HTN hypertension; DM diabetes mellitus; COPD chronic obstructive pulmonary disease

## 2021-12-03 NOTE — Patient Instructions (Signed)
Ofloxacin  4 times daily to the operative eye  Prednisolone acetate 1 drop to the operative eye 4 times daily  Patient instructed not to refill the medications and use them for maximum of 3 weeks.  Patient instructed do not rub the eye.  Patient has the option to use the patch at night.  Patient to discontinue topical medications in the right eye in 9 days or if the medications are complete prior to that time.  Do not refill the medications  Patient to avoid mountain climbing or elevations for the next 6-day

## 2021-12-03 NOTE — Assessment & Plan Note (Signed)
Vastly improved macular findings, OCT and improving acuity some 11 days post vitrectomy membrane peel gas injection for macular hole full-thickness

## 2021-12-03 NOTE — Assessment & Plan Note (Signed)
Condition resolved post vitrectomy 

## 2021-12-17 ENCOUNTER — Encounter (INDEPENDENT_AMBULATORY_CARE_PROVIDER_SITE_OTHER): Payer: Medicare Other | Admitting: Ophthalmology

## 2022-01-07 DIAGNOSIS — N1831 Chronic kidney disease, stage 3a: Secondary | ICD-10-CM | POA: Diagnosis not present

## 2022-01-15 DIAGNOSIS — M109 Gout, unspecified: Secondary | ICD-10-CM | POA: Diagnosis not present

## 2022-01-15 DIAGNOSIS — R609 Edema, unspecified: Secondary | ICD-10-CM | POA: Diagnosis not present

## 2022-01-15 DIAGNOSIS — I129 Hypertensive chronic kidney disease with stage 1 through stage 4 chronic kidney disease, or unspecified chronic kidney disease: Secondary | ICD-10-CM | POA: Diagnosis not present

## 2022-01-15 DIAGNOSIS — E1122 Type 2 diabetes mellitus with diabetic chronic kidney disease: Secondary | ICD-10-CM | POA: Diagnosis not present

## 2022-01-15 DIAGNOSIS — N1831 Chronic kidney disease, stage 3a: Secondary | ICD-10-CM | POA: Diagnosis not present

## 2022-01-16 ENCOUNTER — Telehealth: Payer: Self-pay | Admitting: Family Medicine

## 2022-01-16 NOTE — Telephone Encounter (Signed)
Copied from [place] [id number]. Topic: Medicare AWV >> Jan 16, 2022  2:27 PM Devoria Glassing wrote: Reason for CRM: Left message for patient to schedule Annual Wellness Visit.  Please schedule with Nurse Health Advisor Charlott Rakes, RN at [facility]. This appt can be telephone or office visit. Please call [phone] ask for [facility]

## 2022-01-27 DIAGNOSIS — L603 Nail dystrophy: Secondary | ICD-10-CM | POA: Diagnosis not present

## 2022-01-27 DIAGNOSIS — E1151 Type 2 diabetes mellitus with diabetic peripheral angiopathy without gangrene: Secondary | ICD-10-CM | POA: Diagnosis not present

## 2022-01-27 DIAGNOSIS — I739 Peripheral vascular disease, unspecified: Secondary | ICD-10-CM | POA: Diagnosis not present

## 2022-01-27 DIAGNOSIS — L84 Corns and callosities: Secondary | ICD-10-CM | POA: Diagnosis not present

## 2022-01-30 ENCOUNTER — Encounter: Payer: Self-pay | Admitting: Family Medicine

## 2022-01-30 ENCOUNTER — Ambulatory Visit (INDEPENDENT_AMBULATORY_CARE_PROVIDER_SITE_OTHER): Payer: Medicare Other | Admitting: Family Medicine

## 2022-01-30 ENCOUNTER — Telehealth: Payer: Self-pay

## 2022-01-30 VITALS — BP 110/50 | HR 85 | Temp 97.5°F | Ht 72.0 in | Wt 201.8 lb

## 2022-01-30 DIAGNOSIS — Z794 Long term (current) use of insulin: Secondary | ICD-10-CM

## 2022-01-30 DIAGNOSIS — I2510 Atherosclerotic heart disease of native coronary artery without angina pectoris: Secondary | ICD-10-CM

## 2022-01-30 DIAGNOSIS — R109 Unspecified abdominal pain: Secondary | ICD-10-CM | POA: Diagnosis not present

## 2022-01-30 DIAGNOSIS — R1032 Left lower quadrant pain: Secondary | ICD-10-CM | POA: Diagnosis not present

## 2022-01-30 DIAGNOSIS — E785 Hyperlipidemia, unspecified: Secondary | ICD-10-CM | POA: Diagnosis not present

## 2022-01-30 DIAGNOSIS — I10 Essential (primary) hypertension: Secondary | ICD-10-CM

## 2022-01-30 DIAGNOSIS — Z79899 Other long term (current) drug therapy: Secondary | ICD-10-CM | POA: Diagnosis not present

## 2022-01-30 DIAGNOSIS — E1169 Type 2 diabetes mellitus with other specified complication: Secondary | ICD-10-CM

## 2022-01-30 DIAGNOSIS — E11319 Type 2 diabetes mellitus with unspecified diabetic retinopathy without macular edema: Secondary | ICD-10-CM | POA: Diagnosis not present

## 2022-01-30 LAB — CBC WITH DIFFERENTIAL/PLATELET
Basophils Absolute: 0 10*3/uL (ref 0.0–0.1)
Basophils Relative: 0.5 % (ref 0.0–3.0)
Eosinophils Absolute: 0.2 10*3/uL (ref 0.0–0.7)
Eosinophils Relative: 3.3 % (ref 0.0–5.0)
HCT: 42.3 % (ref 39.0–52.0)
Hemoglobin: 13.6 g/dL (ref 13.0–17.0)
Lymphocytes Relative: 14.7 % (ref 12.0–46.0)
Lymphs Abs: 0.9 10*3/uL (ref 0.7–4.0)
MCHC: 32.2 g/dL (ref 30.0–36.0)
MCV: 89.3 fl (ref 78.0–100.0)
Monocytes Absolute: 0.5 10*3/uL (ref 0.1–1.0)
Monocytes Relative: 7.8 % (ref 3.0–12.0)
Neutro Abs: 4.7 10*3/uL (ref 1.4–7.7)
Neutrophils Relative %: 73.7 % (ref 43.0–77.0)
Platelets: 186 10*3/uL (ref 150.0–400.0)
RBC: 4.74 Mil/uL (ref 4.22–5.81)
RDW: 14.2 % (ref 11.5–15.5)
WBC: 6.3 10*3/uL (ref 4.0–10.5)

## 2022-01-30 LAB — HEMOGLOBIN A1C: Hgb A1c MFr Bld: 8.2 % — ABNORMAL HIGH (ref 4.6–6.5)

## 2022-01-30 LAB — COMPREHENSIVE METABOLIC PANEL
ALT: 12 U/L (ref 0–53)
AST: 23 U/L (ref 0–37)
Albumin: 4.2 g/dL (ref 3.5–5.2)
Alkaline Phosphatase: 59 U/L (ref 39–117)
BUN: 40 mg/dL — ABNORMAL HIGH (ref 6–23)
CO2: 31 mEq/L (ref 19–32)
Calcium: 9.4 mg/dL (ref 8.4–10.5)
Chloride: 101 mEq/L (ref 96–112)
Creatinine, Ser: 1.85 mg/dL — ABNORMAL HIGH (ref 0.40–1.50)
GFR: 34.24 mL/min — ABNORMAL LOW (ref >60.00)
Glucose, Bld: 48 mg/dL — CL (ref 70–99)
Potassium: 4 mEq/L (ref 3.5–5.1)
Sodium: 138 mEq/L (ref 135–145)
Total Bilirubin: 0.6 mg/dL (ref 0.2–1.2)
Total Protein: 8.1 g/dL (ref 6.0–8.3)

## 2022-01-30 MED ORDER — LISINOPRIL 5 MG PO TABS: 5.0000 mg | ORAL_TABLET | Freq: Every day | ORAL | 3 refills | Status: DC

## 2022-01-30 NOTE — Telephone Encounter (Signed)
Sugar was 79 in office on continuous monitor. He did not want to have the snacks we had available (he preferred to get juice in his car) which he got as soon as he went outside after labs. He feels fine this evening and sugar up to 181 (no insulin yet). We did reduce his nighttime insulin by 2 units to 16 units- tonight can cut in half to 8 units  Hasna- I was not alerted at time of listed notification- I need to be physically told about critical's to state that MD is notified- messaging me is not adequate (has happened with our phlebotomist as well)- thanks for working on process

## 2022-01-30 NOTE — Progress Notes (Signed)
Phone [phone] In person visit   Subjective:   Matthew [name] is a 81 y.o. year old very pleasant adult patient who presents for/with See problem oriented charting Chief Complaint  Patient presents with   Follow-up    Pt states he has been experiencing stabbing pain on the left abdominal and it radiates to his back, usually happens at night. When he breathe deeply, he gets the stabbing pain on the left side.    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    Priority: High   PAD (peripheral artery disease) (HCC)-with claudication 05/14/2007    Priority: High   Mild nonproliferative diabetic retinopathy of both eyes ([place]) 12/28/2006    Priority: High    Coronary artery disease s/p CABG 1997 12/28/2006    Priority: High   Diabetes mellitus type II, controlled ([place]) 12/28/2006    Priority: High   Cervical arthritis ([place]) 10/25/2014    Priority: Medium    Erectile dysfunction 08/13/2010    Priority: Medium    CKD (chronic kidney disease), stage III ([place]) 09/15/2008    Priority: Medium    Hyperlipidemia associated with type 2 diabetes mellitus ([place]) 12/28/2006    Priority: Medium    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 Swallow whole.     atorvastatin (LIPITOR) 40 MG tablet Take 1 tablet by mouth once daily 90 tablet 0   Azelastine HCl 137 MCG/SPRAY  SOLN USE 1 SPRAY(S) IN EACH NOSTRIL TWICE DAILY AS DIRECTED 30 mL 0   chlorthalidone (HYGROTON) 25 MG tablet Take 1 tablet by mouth once daily 90 tablet 0   cholecalciferol (VITAMIN D3) 25 MCG (1000 UNIT) tablet Take 1,000 Units by mouth daily.     colchicine (COLCRYS) 0.6 MG tablet Take 1 tablet (0.6 mg total) by mouth daily. 30 tablet 2   Continuous Blood Gluc Receiver (FREESTYLE LIBRE 14 DAY READER) DEVI 2 each by Does not apply route daily. 2 each 11   Continuous Blood Gluc Sensor (FREESTYLE LIBRE 14 DAY SENSOR) MISC 2 each by Does not apply route daily. 2 each 11   cyclobenzaprine (FLEXERIL) 5 MG tablet TAKE ONE TABLET BY MOUTH TWICE DAILY AS NEEDED FOR MUSCLE SPASM (Patient taking differently: Take 5 mg by mouth 2 (two) times daily as needed for muscle spasms.) 30 tablet 0   fexofenadine (ALLEGRA) 180 MG tablet Take 180 mg by mouth daily.     fluticasone (FLONASE) 50 MCG/ACT nasal spray USE 2 SPRAY(S) IN EACH NOSTRIL ONCE DAILY AS NEEDED 16 g 0   lisinopril (ZESTRIL) 5 MG tablet Take 1 tablet (5 mg total) by mouth daily. 90 tablet 3   metoprolol succinate (TOPROL-XL) 100 MG 24 hr tablet TAKE 1 TABLET EVERY DAY WITH OR IMMMEDIATELY FOLLOWING A MEAL 90 tablet 1   NIFEdipine (PROCARDIA-XL) 30 MG (OSM)  24 hr tablet Take 30 mg by mouth 2 (two) times daily.     NOVOLIN 70/30 RELION (70-30) 100 UNIT/ML injection INJECT 22 UNITS INTO THE SKIN IN THE MORNING AND 20 UNITS IN THE EVENING (Patient taking differently: Inject 20-22 Units into the skin See admin instructions. Inject 22 units subcutaneously in the morning & inject 20 units subcutaneously in the evening) 10 mL 0   oxymetazoline (AFRIN) 0.05 % nasal spray Place 1 spray into both nostrils 2 (two) times daily as needed for congestion.     pioglitazone (ACTOS) 30 MG tablet Take 1 tablet by mouth once daily 90 tablet 0   No current facility-administered medications for this visit.     Objective:  BP (!) 110/50   Pulse 85   Temp (!) 97.5 F  (36.4 C)   Ht 6' (1.829 m)   Wt 201 lb 12.8 oz (91.5 kg)   SpO2 (!) 87%   BMI 27.37 kg/m  Gen: NAD, resting comfortably CV: RRR no murmurs rubs or gallops Lungs: CTAB no crackles, wheeze, rhonchi Abdomen: soft/significant tenderness in LLQ with some guarding- mild tenderness in LUQ on exam- no groin pain with palpation- no CVA tenderness/nondistended/normal bowel sounds. No rebound.  Ext: trace edema Skin: warm, dry  Diabetic Foot Exam - Simple   Simple Foot Form Diabetic Foot exam was performed with the following findings: Yes 01/30/2022 11:13 AM  Visual Inspection No deformities, no ulcerations, no other skin breakdown bilaterally: Yes Sensation Testing Intact to touch and monofilament testing bilaterally: Yes Pulse Check Posterior Tibialis and Dorsalis pulse intact bilaterally: Yes Comments       Assessment and Plan   #Social update-wife with multiple myeloma (diagnosis 2021)-he reports she has been doing better lately    #Left-sided abdominal pain radiating to the back S: Patient reports experiencing stabbing pain on the left side of his low back that radiates around to his abdomen- starting to feel some in the groin- starting about 2 weeks ago.  More frequently occurs at night.  Worse with deep breaths or rotating in bed at times.  - no fall or injury -hits him suddenly and is severe/breathtaking- can last 20-30 seconds before settling some. Ok at the moment without pain. Occurs at least several times a day. Has not tried medication for this.   Also has had right sided glute pain for several months- or perhaps a year- no recent imaging. Repositioning helps this pain.  A/P: left sided lower abdominal pain- need to rule out diverticulitis- we will get CT scan of the abdomen- I doubt kidney stones with pain with palpation but this will also rule this out  - also update labs- hope to get scan by tomorrow - can adjust contrast for CKD III  Suspect pain in right buttocks is  musculoskeletal related but some of this will be imaged on CT- if ongoing issues after we address the CT scan may consider sports medicine consult  #PAD-follows with vascular surgery #CAD-follows with cardiology Dr. Burt Knack #hyperlipidemia S: Medication: Aspirin 81 mg, atorvastatin 40 mg -No chest pain or shortness of breath -Does get some pain into his legs but has history of neuropathy as well- stable claudication  Lab Results  Component Value Date   CHOL 119 10/01/2021   HDL 43.40 10/01/2021   LDLCALC 65 10/01/2021   LDLDIRECT 69.0 03/11/2021   TRIG 53.0 10/01/2021   CHOLHDL 3 10/01/2021   A/P: CAD-asymptomatic-continue current medication PAD-continue current medication, stable claudication For hyperlipidemia-LDL has been well  controlled-continue current medication with atorvastatin 40 mg   # Diabetes S: Medication: Actos 30 mg daily, Novolin 70/30-reduce Novolin to 20 units in the morning and 18 units before bed last visit-I also previously discontinued glimepiride due to some lows -No significant worsening edema on Actos-does wear compression stockings most of the time  -Neuropathy/claudication limits his exercise  CBGs- sugar 79 today during visit, sparing lows with reduction - lowest he has been is in 60 range but usually able to correct quickly- usually after dinner- does a snack and does well- no lows overnight Lab Results  Component Value Date   HGBA1C 8.6 (H) 10/01/2021   HGBA1C 7.2 (H) 03/11/2021   HGBA1C 6.4 11/05/2020   A/P: hopefully improved- prefer a1c under 8.5 but want to avoid lows- update a1c today. Continue current meds for now -prior endo referral but with fewer lows and if a1c improved under 8.5 we may continue care- I also wonder about starting jardiance in the future depending on findings oday  #hypertension #CKD stage III S: medication: Chlorthalidone 25 mg daily, Procardia 30 mg twice daily,  metoprolol 100 mg sXR - Lisinopril was stopped by Dr. Oneida Alar  for unclear reason -GFR typically in the 40s Home readings #s: good reports at home BP Readings from Last 3 Encounters:  01/30/22 (!) 110/50. He got 124/73 when he checked today  11/13/21 (!) 149/65  10/01/21 (!) 150/62  A/P: Hypertension well-controlled-continue current medication CKD stage III-hopefully stable-update CMP -hed prefer to restart lisinopril- try lower dose at 5 mg to make sure BP doesn't go too low - especially with microalbumin/cr ratio trending up -Start lisinopril back but lets wait until things settle down with the abdomen  Recommended follow up: Return in about 4 months (around 06/01/2022) for followup or sooner if needed.Schedule b4 you leave. Future Appointments  Date Time Provider [place]  03/10/2022  1:00 PM Rankin, Clent Demark, MD RDE-RDE None   Lab/Order associations:   ICD-10-CM   1. Controlled type 2 diabetes mellitus with retinopathy, with long-term current use of insulin, macular edema presence unspecified, unspecified laterality, unspecified retinopathy severity (HCC)  E11.319 CBC with Differential/Platelet   Z79.4 Comprehensive metabolic panel    Hemoglobin A1c    2. Left sided abdominal pain  R10.9 POCT Urinalysis Dipstick (Automated)    CT Abdomen Pelvis W Contrast    3. Essential hypertension  I10     4. Hyperlipidemia associated with type 2 diabetes mellitus ([place])  E11.69    E78.5     5. High risk medication use  Z79.899 POCT Urinalysis Dipstick (Automated)    6. LLQ pain  R10.32 CT Abdomen Pelvis W Contrast     Meds ordered this encounter  Medications   lisinopril (ZESTRIL) 5 MG tablet    Sig: Take 1 tablet (5 mg total) by mouth daily.    Dispense:  90 tablet    Refill:  3   Time Spent: 41 minutes of total time (10:48- 11:29 AM) was spent on the date of the encounter performing the following actions: chart review prior to seeing the patient, obtaining history, performing a medically necessary exam, counseling on the treatment plan  and reasons for CT scan and potential causes, placing orders, and documenting in our EHR.    Return precautions advised.  Garret Reddish, MD

## 2022-01-30 NOTE — Assessment & Plan Note (Signed)
#  hypertension #CKD stage III S: medication: Chlorthalidone 25 mg daily, Procardia 30 mg twice daily,  metoprolol 100 mg sXR - Lisinopril was stopped by Dr. Oneida Alar for unclear reason -GFR typically in the 40s Home readings #s: good reports at home BP Readings from Last 3 Encounters:  01/30/22 (!) 110/50. He got 124/73 when he checked today  11/13/21 (!) 149/65  10/01/21 (!) 150/62  A/P: Hypertension well-controlled-continue current medication CKD stage III-hopefully stable-update CMP -hed prefer to restart lisinopril- try lower dose at 5 mg to make sure BP doesn't go too low - especially with microalbumin/cr ratio trending up

## 2022-01-30 NOTE — Patient Instructions (Addendum)
Flu shot- we should have these available within a month or two but please let us know if you get at outside pharmacy  Start lisinopril back but lets wait until things settle down with the abdomen  Stat CT scan- likely tomorrow- team please give him info on this before he leaves- we will get the labs done today- they may not be able to use contrast depending on your kidney function but I gave radiology flexibility to adjust if they think it can be used  Suspect pain in right buttocks is musculoskeletal related but some of this will be imaged on CT- if ongoing issues after we address the CT scan may consider sports medicine consult  Please stop by lab before you go If you have mychart- we will send your results within 3 business days of [place] receiving them.  If you do not have mychart- we will call you about results within 5 business days of [place] receiving them.  *please also note that you will see labs on mychart as soon as they post. I will later go in and write notes on them- will say "notes from Dr. Yong Channel"   Recommended follow up: Return in about 4 months (around 06/01/2022) for followup or sooner if needed.Schedule b4 you leave.  -with the exception that we need to work up abdominal pain- if you have new or worsening symptoms in particular fever or severe pain in the abdomen seek care

## 2022-01-30 NOTE — Telephone Encounter (Signed)
CRITICAL VALUE STICKER  CRITICAL VALUE: Glucose 48  RECEIVER (on-site recipient of call):Matthew [name]  DATE & TIME NOTIFIED: 73  MESSENGER (representative from lab):Hope  MD NOTIFIED:[place]  TIME OF NOTIFICATION: [id number]  RESPONSE:

## 2022-01-31 ENCOUNTER — Encounter: Payer: Self-pay | Admitting: Family Medicine

## 2022-01-31 ENCOUNTER — Ambulatory Visit (HOSPITAL_COMMUNITY)
Admission: RE | Admit: 2022-01-31 | Discharge: 2022-01-31 | Disposition: A | Discharge status: Home / Self Care | Payer: Medicare Other | Source: Ambulatory Visit | Attending: Family Medicine | Admitting: Family Medicine

## 2022-01-31 DIAGNOSIS — I700 Atherosclerosis of aorta: Secondary | ICD-10-CM | POA: Insufficient documentation

## 2022-01-31 DIAGNOSIS — R109 Unspecified abdominal pain: Secondary | ICD-10-CM | POA: Diagnosis not present

## 2022-01-31 DIAGNOSIS — N281 Cyst of kidney, acquired: Secondary | ICD-10-CM | POA: Diagnosis not present

## 2022-01-31 DIAGNOSIS — R1032 Left lower quadrant pain: Secondary | ICD-10-CM | POA: Diagnosis not present

## 2022-01-31 DIAGNOSIS — N3289 Other specified disorders of bladder: Secondary | ICD-10-CM | POA: Diagnosis not present

## 2022-01-31 MED ORDER — IOHEXOL 300 MG/ML  SOLN
80.0000 mL | Freq: Once | INTRAMUSCULAR | Status: AC | PRN
  Administered 2022-01-31: 80 mL via INTRAVENOUS

## 2022-02-03 ENCOUNTER — Telehealth (INDEPENDENT_AMBULATORY_CARE_PROVIDER_SITE_OTHER): Payer: Medicare Other | Admitting: Family Medicine

## 2022-02-03 ENCOUNTER — Encounter: Payer: Self-pay | Admitting: Family Medicine

## 2022-02-03 DIAGNOSIS — R109 Unspecified abdominal pain: Secondary | ICD-10-CM | POA: Diagnosis not present

## 2022-02-03 DIAGNOSIS — Z79899 Other long term (current) drug therapy: Secondary | ICD-10-CM | POA: Diagnosis not present

## 2022-02-03 NOTE — Telephone Encounter (Signed)
FYI

## 2022-02-03 NOTE — Telephone Encounter (Signed)
Patient states: -On 08/03 he dropped off a urine sample as instructed to lab.  - He handed his sample to a lab tech from [facility] that was assisting that day - He was confused as to why his sample wasn't received by PCP, which was indicated in PCP notes on CT imaging.  - He is in pain and confused why he was suspected to have a UTI, but nothing was called in  Patient requests:  - A medication be called in for his UTI  - Clarification on what the next steps are to treat this issue

## 2022-02-03 NOTE — Telephone Encounter (Signed)
Keep but I need a urine culture and urinalysis-the reason I cannot start an antibiotic yet is because I need to have the culture submitted before we send in antibiotics-once we have the initial test let me know and I can send an antibiotic

## 2022-02-04 ENCOUNTER — Other Ambulatory Visit: Payer: Medicare Other

## 2022-02-04 ENCOUNTER — Other Ambulatory Visit: Payer: Self-pay | Admitting: Family Medicine

## 2022-02-04 DIAGNOSIS — R109 Unspecified abdominal pain: Secondary | ICD-10-CM

## 2022-02-04 DIAGNOSIS — Z79899 Other long term (current) drug therapy: Secondary | ICD-10-CM | POA: Diagnosis not present

## 2022-02-04 LAB — POC URINALSYSI DIPSTICK (AUTOMATED)
Bilirubin, UA: NEGATIVE
Blood, UA: NEGATIVE
Glucose, UA: NEGATIVE
Ketones, UA: NEGATIVE
Leukocytes, UA: NEGATIVE
Nitrite, UA: NEGATIVE
Protein, UA: POSITIVE — AB
Spec Grav, UA: 1.025 (ref 1.010–1.025)
Urobilinogen, UA: 0.2 E.U./dL
pH, UA: 6 (ref 5.0–8.0)

## 2022-02-04 MED ORDER — SULFAMETHOXAZOLE-TRIMETHOPRIM 800-160 MG PO TABS: 1.0000 | ORAL_TABLET | Freq: Two times a day (BID) | ORAL | 0 refills | Status: DC

## 2022-02-04 NOTE — Telephone Encounter (Signed)
Yes, please schedule lab visit for pt to leave sample.

## 2022-02-05 LAB — URINE CULTURE
MICRO NUMBER:: 13750574
SPECIMEN QUALITY:: ADEQUATE

## 2022-02-12 ENCOUNTER — Encounter: Payer: Self-pay | Admitting: Family Medicine

## 2022-02-12 DIAGNOSIS — N3289 Other specified disorders of bladder: Secondary | ICD-10-CM

## 2022-02-12 DIAGNOSIS — R109 Unspecified abdominal pain: Secondary | ICD-10-CM

## 2022-02-17 ENCOUNTER — Encounter: Payer: Self-pay | Admitting: Family Medicine

## 2022-02-17 ENCOUNTER — Telehealth: Payer: Self-pay | Admitting: Family Medicine

## 2022-02-17 NOTE — Telephone Encounter (Signed)
Caller States: -fax requesting notes from PCP team was sent previous. -will refax.  Awaiting fax to come through.

## 2022-02-18 NOTE — Telephone Encounter (Signed)
Office notes faxed to  [facility].

## 2022-02-25 ENCOUNTER — Encounter: Payer: Self-pay | Admitting: Family Medicine

## 2022-02-25 DIAGNOSIS — R3915 Urgency of urination: Secondary | ICD-10-CM | POA: Diagnosis not present

## 2022-02-25 DIAGNOSIS — N281 Cyst of kidney, acquired: Secondary | ICD-10-CM | POA: Diagnosis not present

## 2022-02-25 DIAGNOSIS — N401 Enlarged prostate with lower urinary tract symptoms: Secondary | ICD-10-CM | POA: Diagnosis not present

## 2022-02-25 NOTE — Telephone Encounter (Signed)
LVM to schedule an office visit

## 2022-03-01 ENCOUNTER — Other Ambulatory Visit: Payer: Self-pay | Admitting: Family Medicine

## 2022-03-10 ENCOUNTER — Ambulatory Visit (INDEPENDENT_AMBULATORY_CARE_PROVIDER_SITE_OTHER): Payer: Medicare Other | Admitting: Ophthalmology

## 2022-03-10 ENCOUNTER — Encounter (INDEPENDENT_AMBULATORY_CARE_PROVIDER_SITE_OTHER): Payer: Self-pay | Admitting: Ophthalmology

## 2022-03-10 ENCOUNTER — Other Ambulatory Visit: Payer: Self-pay | Admitting: Family Medicine

## 2022-03-10 DIAGNOSIS — H43821 Vitreomacular adhesion, right eye: Secondary | ICD-10-CM

## 2022-03-10 DIAGNOSIS — E113213 Type 2 diabetes mellitus with mild nonproliferative diabetic retinopathy with macular edema, bilateral: Secondary | ICD-10-CM

## 2022-03-10 DIAGNOSIS — H35341 Macular cyst, hole, or pseudohole, right eye: Secondary | ICD-10-CM

## 2022-03-10 DIAGNOSIS — H35352 Cystoid macular degeneration, left eye: Secondary | ICD-10-CM | POA: Diagnosis not present

## 2022-03-10 DIAGNOSIS — I2510 Atherosclerotic heart disease of native coronary artery without angina pectoris: Secondary | ICD-10-CM

## 2022-03-10 NOTE — Assessment & Plan Note (Signed)
4 months postsurgical repair looks great I attached macular flap excellent acuity

## 2022-03-10 NOTE — Assessment & Plan Note (Signed)
Minor good acuity

## 2022-03-10 NOTE — Progress Notes (Signed)
03/10/2022     CHIEF COMPLAINT Patient presents for  Chief Complaint  Patient presents with   Retina Evaluation      HISTORY OF PRESENT ILLNESS: Matthew [name] is a 81 y.o. adult who presents to the clinic today for:   HPI    Visual acuity continues to improve post vitrectomy repair macular hole with membrane peel gas injection.  Looks great 3 mths dilate ou oct color fp Pt states his vision has been stable Pt denies any new floaters or FOL  Last edited by Hurman Horn, MD on 03/10/2022  1:52 PM.      Referring physician: Marin Olp, MD [place],  [place] [address]  HISTORICAL INFORMATION:   Selected notes from the MEDICAL RECORD NUMBER    Lab Results  Component Value Date   HGBA1C 8.2 (H) 01/30/2022     CURRENT MEDICATIONS: No current outpatient medications on file. (Ophthalmic Drugs)   No current facility-administered medications for this visit. (Ophthalmic Drugs)   Current Outpatient Medications (Other)  Medication Sig   aspirin EC 81 MG tablet Take 81 mg by mouth daily. Swallow whole.   atorvastatin (LIPITOR) 40 MG tablet Take 1 tablet by mouth once daily   Azelastine HCl 137 MCG/SPRAY SOLN USE 1 SPRAY(S) IN EACH NOSTRIL TWICE DAILY AS DIRECTED   chlorthalidone (HYGROTON) 25 MG tablet Take 1 tablet by mouth once daily   cholecalciferol (VITAMIN D3) 25 MCG (1000 UNIT) tablet Take 1,000 Units by mouth daily.   colchicine (COLCRYS) 0.6 MG tablet Take 1 tablet (0.6 mg total) by mouth daily.   Continuous Blood Gluc Receiver (FREESTYLE LIBRE 14 DAY READER) DEVI 2 each by Does not apply route daily.   Continuous Blood Gluc Sensor (FREESTYLE LIBRE 14 DAY SENSOR) MISC 2 each by Does not apply route daily.   cyclobenzaprine (FLEXERIL) 5 MG tablet TAKE ONE TABLET BY MOUTH TWICE DAILY AS NEEDED FOR MUSCLE SPASM (Patient taking differently: Take 5 mg by mouth 2 (two) times daily as needed for muscle spasms.)   fexofenadine (ALLEGRA) 180 MG  tablet Take 180 mg by mouth daily.   fluticasone (FLONASE) 50 MCG/ACT nasal spray USE 2 SPRAY(S) IN EACH NOSTRIL ONCE DAILY AS NEEDED   lisinopril (ZESTRIL) 5 MG tablet Take 1 tablet (5 mg total) by mouth daily.   metoprolol succinate (TOPROL-XL) 100 MG 24 hr tablet TAKE 1 TABLET EVERY DAY WITH OR IMMMEDIATELY FOLLOWING A MEAL   NIFEdipine (PROCARDIA-XL) 30 MG (OSM) 24 hr tablet Take 30 mg by mouth 2 (two) times daily.   NOVOLIN 70/30 RELION (70-30) 100 UNIT/ML injection INJECT 22 UNITS INTO THE SKIN IN THE MORNING AND 20 UNITS IN THE EVENING (Patient taking differently: Inject 20-22 Units into the skin See admin instructions. Inject 22 units subcutaneously in the morning & inject 20 units subcutaneously in the evening)   oxymetazoline (AFRIN) 0.05 % nasal spray Place 1 spray into both nostrils 2 (two) times daily as needed for congestion.   pioglitazone (ACTOS) 30 MG tablet Take 1 tablet by mouth once daily   sulfamethoxazole-trimethoprim (BACTRIM DS) 800-160 MG tablet Take 1 tablet by mouth 2 (two) times daily.   No current facility-administered medications for this visit. (Other)      REVIEW OF SYSTEMS: ROS   Negative for: Constitutional, Gastrointestinal, Neurological, Skin, Genitourinary, Musculoskeletal, HENT, Endocrine, Cardiovascular, Eyes, Respiratory, Psychiatric, Allergic/Imm, Heme/Lymph Last edited by Morene Rankins, CMA on 03/10/2022  1:03 PM.  ALLERGIES Allergies  Allergen Reactions   Penicillins Hives   Probenecid     Developed lump on chest- went away when he stopped the medicine   Tetracycline Hcl Hives   Allopurinol Itching    PAST MEDICAL HISTORY Past Medical History:  Diagnosis Date   Cataract, nuclear sclerotic, right eye 11/20/2020   CLAUDICATION 05/14/2007   CORONARY ARTERY DISEASE 12/28/2006   Myoview 8/21: EF 73, normal perfusion, low risk    Cystoid macular edema of left eye 03/20/2021   Diabetes mellitus type II, controlled ([place]) 12/28/2006    Actos 60m, insulin 70/30 20 units BID, amaryl 118mpreviously a1c <8. Worsened due to cold over 5 weeks around 05/2014 eating poorly and eating sugary syrups.   Stomach irritation on metformin.  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/20/2020   Vitrectomy 11/21/2021   Past Surgical History:  Procedure Laterality Date   ABDOMINAL AORTOGRAM W/LOWER EXTREMITY N/A 02/10/2020   Procedure: ABDOMINAL AORTOGRAM W/LOWER EXTREMITY;  Surgeon: FiElam DutchMD;  [facility];  Service: Cardiovascular;  Laterality: N/A;   CATARACT EXTRACTION Left 06/21/2020   CATARACT EXTRACTION Right    COLONOSCOPY     CORONARY ARTERY BYPASS GRAFT     ENDARTERECTOMY FEMORAL Left 02/27/2020   Procedure: LEFT COMMON FEMORAL ENDARTERECTOMY;  Surgeon: FiElam DutchMD;  [facility] Service: [facility];  Laterality: Left;   FEMORAL-POPLITEAL BYPASS GRAFT Left 02/27/2020   Procedure: LEFT FEMORAL-ABOVE KNEE POPLITEAL BYPASS WITH NON REVERSED GREATER SAPHENOUS VEIN;  Surgeon: FiElam DutchMD;  [facility] Service: V[facility];  Laterality: Left;   FEMORAL-POPLITEAL BYPASS GRAFT Right 04/23/2020   Procedure: BYPASS GRAFT FEMORAL- Above Knee POPLITEAL ARTERY RIGHT Using Right Greater Saphenous Vein;  Surgeon: FiElam DutchMD;  [facility];  Service: Vascular;  Laterality: Right;   KNEE ARTHROSCOPY  2012   UPPER  GASTROINTESTINAL ENDOSCOPY      FAMILY HISTORY Family History  Problem Relation Age of Onset   Diabetes Mother    Hypertension Mother    Stroke Father    Colon cancer Neg Hx    Esophageal cancer Neg Hx    Rectal cancer Neg Hx    Stomach cancer Neg Hx     SOCIAL HISTORY Social History   Tobacco Use   Smoking status: Former    Types: Cigarettes    Quit date: 07/09/1968    Years since quitting: 53.7    Passive exposure: Never   Smokeless tobacco: Former    Types: Chew    Quit date: 07/09/1988   Tobacco comments:    minimal use x 10 years   Vaping Use   Vaping Use: Never used  Substance Use Topics   Alcohol use: No    Comment: last was 6 months   Drug use: No  OPHTHALMIC EXAM:  Base Eye Exam     Visual Acuity (ETDRS)       Right Left   Dist [place] 20/25 -1 20/20 -1         Tonometry (Tonopen, 1:09 PM)       Right Left   Pressure 7 5         Pupils       Pupils   Right PERRL   Left PERRL         Visual Fields       Left Right    Full Full         Extraocular Movement       Right Left    Ortho Ortho    -- -- --  --  --  -- -- --   -- -- --  --  --  -- -- --           Neuro/Psych     Oriented x3: Yes   Mood/Affect: Normal         Dilation     Both eyes: 1.0% Mydriacyl, 2.5% Phenylephrine @ 1:04 PM           Slit Lamp and Fundus Exam     External Exam       Right Left   External Normal Normal         Slit Lamp Exam       Right Left   Lids/Lashes Normal Normal   Conjunctiva/Sclera White and quiet White and quiet   Cornea Clear Clear   Anterior Chamber Deep and quiet Deep and quiet   Iris Round and reactive Round and reactive   Lens Centered posterior chamber intraocular lens Centered posterior chamber intraocular lens   Anterior Vitreous Normal Normal         Fundus Exam       Right Left   Posterior Vitreous Clear, avitric clear avitric   Disc Normal Normal   C/D Ratio 0.5 0.5   Macula  Macular hole is closed 20 D exam, neg Watzke sign Normal   Vessels Normal Normal   Periphery Normal Normal            IMAGING AND PROCEDURES  Imaging and Procedures for 03/10/22  OCT, Retina - OU - Both Eyes       Right Eye Quality was good. Scan locations included subfoveal. Central Foveal Thickness: 326. Progression has been stable. Findings include abnormal foveal contour, vitreous traction, vitreomacular adhesion .   Left Eye Quality was good. Scan locations included subfoveal. Central Foveal Thickness: 344. Progression has improved. Findings include abnormal foveal contour, cystoid macular edema.   Notes OD, macular hole now closed.  Some 4 months postsurgical repair via vitrectomy membrane peel gas injection with improved acuity   OS, vastly improved 6 months post vitrectomy for VMT with inner and outer foveal distortion and vision loss..  Outer photoreceptor layer now normal without serous fluid.  Small regional noncentral involved CME on temporal edge of FAZ newly present.  This is most likely MAC-TEL OS, pt denies having sleep apnea      Color Fundus Photography Optos - OU - Both Eyes       Right Eye Progression has improved. Disc findings include normal observations. Periphery : normal observations.   Left Eye Progression has been stable. Disc findings include normal observations. Macula : microaneurysms. Periphery : normal observations.   Notes Bilateral microaneurysm changes mostly in the para macular region.  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 Vitreomacular  traction syndrome of right eye  H43.821 OCT, Retina - OU - Both Eyes    Color Fundus Photography Optos - OU - Both Eyes    2. Full thickness macular hole, right  H35.341     3. Both eyes affected by mild nonproliferative diabetic retinopathy with macular edema, associated with type 2 diabetes mellitus ([place])  J47.8295     4. Cystoid macular edema of left eye  H35.352       1.  OD, resolved VMT, and resolved macular hole per surgical repair May 2023.  2.  Mild NPDR no progression.  Patient to continue with excellent blood sugar control monitoring  3.  Ophthalmic Meds Ordered this visit:  No orders of the defined types were placed in this encounter.      Return in about 4 months (around 07/10/2022) for DILATE OU, OCT.  There are no Patient Instructions on file for this visit.   Explained the diagnoses, plan, and follow up with the patient and they expressed understanding.  Patient expressed understanding of the importance of proper follow up care.   Clent Demark Diana Davenport M.D. Diseases & Surgery of the Retina and Vitreous Retina [facility] Diabetic [place] 03/10/22     Abbreviations: M myopia (nearsighted); A astigmatism; H hyperopia (farsighted); P presbyopia; Mrx spectacle prescription;  CTL contact lenses; OD right eye; OS left eye; OU both eyes  XT exotropia; ET esotropia; PEK punctate epithelial keratitis; PEE punctate epithelial erosions; DES dry eye syndrome; MGD meibomian gland dysfunction; ATs artificial tears; PFAT's preservative free artificial tears; [place] nuclear sclerotic cataract; PSC posterior subcapsular cataract; ERM epi-retinal membrane; PVD posterior vitreous detachment; RD retinal detachment; DM diabetes mellitus; DR diabetic retinopathy; NPDR non-proliferative diabetic retinopathy; PDR proliferative diabetic retinopathy; CSME clinically significant macular edema; DME diabetic macular edema; dbh dot blot hemorrhages; CWS cotton wool spot; POAG primary open angle glaucoma; C/D  cup-to-disc ratio; HVF humphrey visual field; GVF goldmann visual field; OCT optical coherence tomography; IOP intraocular pressure; BRVO Branch retinal vein occlusion; CRVO central retinal vein occlusion; CRAO central retinal artery occlusion; BRAO branch retinal artery occlusion; RT retinal tear; SB scleral buckle; PPV pars plana vitrectomy; VH Vitreous hemorrhage; PRP panretinal laser photocoagulation; IVK intravitreal kenalog; VMT vitreomacular traction; MH Macular hole;  NVD neovascularization of the disc; NVE neovascularization elsewhere; AREDS age related eye disease study; ARMD age related macular degeneration; POAG primary open angle glaucoma; EBMD epithelial/anterior basement membrane dystrophy; ACIOL anterior chamber intraocular lens; IOL intraocular lens; PCIOL posterior chamber intraocular lens; Phaco/IOL phacoemulsification with intraocular lens placement; [place] photorefractive keratectomy; LASIK laser assisted in situ keratomileusis; HTN hypertension; DM diabetes mellitus; COPD chronic obstructive pulmonary disease

## 2022-03-10 NOTE — Assessment & Plan Note (Signed)
Mild OU

## 2022-03-24 ENCOUNTER — Encounter: Payer: Self-pay | Admitting: *Deleted

## 2022-04-11 ENCOUNTER — Telehealth: Payer: Self-pay | Admitting: Family Medicine

## 2022-04-11 NOTE — Telephone Encounter (Signed)
Copied from [place] [phone]. Topic: Medicare AWV >> Apr 11, 2022 10:06 AM Devoria Glassing wrote: Reason for CRM: Left message for patient to schedule Annual Wellness Visit.  Please schedule with Nurse Health Advisor Charlott Rakes, RN at [facility]. This appt can be telephone or office visit. Please call [phone] ask for [facility]

## 2022-04-29 ENCOUNTER — Telehealth: Payer: Self-pay | Admitting: Family Medicine

## 2022-04-29 NOTE — Telephone Encounter (Signed)
Copied from [place] [phone]. Topic: Medicare AWV >> Apr 29, 2022  1:08 PM Gillis Santa wrote: Reason for CRM: LVM FOR PATIENT TO CALL [phone] TO SCHEDULE AWVI WITH HEALTH COACH

## 2022-05-07 DIAGNOSIS — M7742 Metatarsalgia, left foot: Secondary | ICD-10-CM | POA: Diagnosis not present

## 2022-05-07 DIAGNOSIS — L84 Corns and callosities: Secondary | ICD-10-CM | POA: Diagnosis not present

## 2022-05-07 DIAGNOSIS — M7741 Metatarsalgia, right foot: Secondary | ICD-10-CM | POA: Diagnosis not present

## 2022-05-07 DIAGNOSIS — M2042 Other hammer toe(s) (acquired), left foot: Secondary | ICD-10-CM | POA: Diagnosis not present

## 2022-05-07 DIAGNOSIS — M2041 Other hammer toe(s) (acquired), right foot: Secondary | ICD-10-CM | POA: Diagnosis not present

## 2022-05-20 ENCOUNTER — Telehealth: Payer: Self-pay | Admitting: Family Medicine

## 2022-05-20 NOTE — Telephone Encounter (Signed)
Caller States: -They received recent 01/30/22 notes. -Only missing prescription from PCP team -Faxed on 05/03/22 and will refax today the forms with information about continuous glucose monitor: FreeStyle libre. -Will call back to follow up on receipt of fax on 05/27/22  Caller requests: -follow up via information on fax sent today 05/20/22   Awaiting fax.

## 2022-06-02 ENCOUNTER — Ambulatory Visit (INDEPENDENT_AMBULATORY_CARE_PROVIDER_SITE_OTHER): Payer: Medicare Other | Admitting: Family Medicine

## 2022-06-02 ENCOUNTER — Encounter: Payer: Self-pay | Admitting: Family Medicine

## 2022-06-02 VITALS — BP 128/68 | HR 56 | Temp 97.7°F | Ht 72.0 in | Wt 207.0 lb

## 2022-06-02 DIAGNOSIS — I2510 Atherosclerotic heart disease of native coronary artery without angina pectoris: Secondary | ICD-10-CM

## 2022-06-02 DIAGNOSIS — E1169 Type 2 diabetes mellitus with other specified complication: Secondary | ICD-10-CM | POA: Diagnosis not present

## 2022-06-02 DIAGNOSIS — N183 Chronic kidney disease, stage 3 unspecified: Secondary | ICD-10-CM

## 2022-06-02 DIAGNOSIS — I10 Essential (primary) hypertension: Secondary | ICD-10-CM | POA: Diagnosis not present

## 2022-06-02 DIAGNOSIS — Z794 Long term (current) use of insulin: Secondary | ICD-10-CM | POA: Diagnosis not present

## 2022-06-02 DIAGNOSIS — E785 Hyperlipidemia, unspecified: Secondary | ICD-10-CM | POA: Diagnosis not present

## 2022-06-02 DIAGNOSIS — E11319 Type 2 diabetes mellitus with unspecified diabetic retinopathy without macular edema: Secondary | ICD-10-CM | POA: Diagnosis not present

## 2022-06-02 LAB — COMPREHENSIVE METABOLIC PANEL
ALT: 12 U/L (ref 0–53)
AST: 19 U/L (ref 0–37)
Albumin: 4.1 g/dL (ref 3.5–5.2)
Alkaline Phosphatase: 59 U/L (ref 39–117)
BUN: 39 mg/dL — ABNORMAL HIGH (ref 6–23)
CO2: 30 mEq/L (ref 19–32)
Calcium: 9.6 mg/dL (ref 8.4–10.5)
Chloride: 105 mEq/L (ref 96–112)
Creatinine, Ser: 1.7 mg/dL — ABNORMAL HIGH (ref 0.40–1.50)
GFR: 37.8 mL/min — ABNORMAL LOW (ref >60.00)
Glucose, Bld: 73 mg/dL (ref 70–99)
Potassium: 4.8 mEq/L (ref 3.5–5.1)
Sodium: 141 mEq/L (ref 135–145)
Total Bilirubin: 0.5 mg/dL (ref 0.2–1.2)
Total Protein: 7.8 g/dL (ref 6.0–8.3)

## 2022-06-02 LAB — CBC WITH DIFFERENTIAL/PLATELET
Basophils Absolute: 0 10*3/uL (ref 0.0–0.1)
Basophils Relative: 0.3 % (ref 0.0–3.0)
Eosinophils Absolute: 0.3 10*3/uL (ref 0.0–0.7)
Eosinophils Relative: 5 % (ref 0.0–5.0)
HCT: 43.7 % (ref 39.0–52.0)
Hemoglobin: 14.3 g/dL (ref 13.0–17.0)
Lymphocytes Relative: 15.9 % (ref 12.0–46.0)
Lymphs Abs: 0.9 10*3/uL (ref 0.7–4.0)
MCHC: 32.8 g/dL (ref 30.0–36.0)
MCV: 89.4 fl (ref 78.0–100.0)
Monocytes Absolute: 0.5 10*3/uL (ref 0.1–1.0)
Monocytes Relative: 8.8 % (ref 3.0–12.0)
Neutro Abs: 4.2 10*3/uL (ref 1.4–7.7)
Neutrophils Relative %: 70 % (ref 43.0–77.0)
Platelets: 183 10*3/uL (ref 150.0–400.0)
RBC: 4.89 Mil/uL (ref 4.22–5.81)
RDW: 13.8 % (ref 11.5–15.5)
WBC: 6 10*3/uL (ref 4.0–10.5)

## 2022-06-02 LAB — HEMOGLOBIN A1C: Hgb A1c MFr Bld: 7.3 % — ABNORMAL HIGH (ref 4.6–6.5)

## 2022-06-02 MED ORDER — EMPAGLIFLOZIN 10 MG PO TABS: 10.0000 mg | ORAL_TABLET | Freq: Every day | ORAL | 5 refills | Status: DC

## 2022-06-02 NOTE — Patient Instructions (Addendum)
If jardiance is covered and reasonable -reduce morning insulin to 18 units and evening to 16 units  If jardiance not covered -only reduce evening insulin to 16 units.  Regardless update me in 2-3 weeks with home readings. Really want to avoid lows  Please stop by lab before you go If you have mychart- we will send your results within 3 business days of [place] receiving them.  If you do not have mychart- we will call you about results within 5 business days of [place] receiving them.  *please also note that you will see labs on mychart as soon as they post. I will later go in and write notes on them- will say "notes from Dr. Yong Channel"   Recommended follow up: Return in about 4 months (around 10/02/2022) for followup or sooner if needed.Schedule b4 you leave.

## 2022-06-02 NOTE — Progress Notes (Signed)
Phone [phone] In person visit   Subjective:   Matthew [name] is a 81 y.o. 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    Priority: High   PAD (peripheral artery disease) (HCC)-with claudication 05/14/2007    Priority: High   Mild nonproliferative diabetic retinopathy of both eyes ([place]) 12/28/2006    Priority: High    Coronary artery disease s/p CABG 1997 12/28/2006    Priority: High   Diabetes mellitus type II, controlled ([place]) 12/28/2006    Priority: High   Aortic atherosclerosis ([place]) 01/31/2022    Priority: Medium    Cervical arthritis ([place]) 10/25/2014    Priority: Medium    Erectile dysfunction 08/13/2010    Priority: Medium    CKD (chronic kidney disease), stage III ([place]) 09/15/2008    Priority: Medium    Hyperlipidemia associated with type 2 diabetes mellitus ([place]) 12/28/2006    Priority: Medium    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 Swallow whole.     atorvastatin (LIPITOR) 40 MG tablet Take 1 tablet by mouth once daily 90 tablet 0   Azelastine HCl 137 MCG/SPRAY SOLN USE 1 SPRAY(S) IN EACH NOSTRIL TWICE DAILY AS DIRECTED 30 mL 0   chlorthalidone (HYGROTON) 25 MG tablet Take 1 tablet by mouth once  daily 90 tablet 0   cholecalciferol (VITAMIN D3) 25 MCG (1000 UNIT) tablet Take 1,000 Units by mouth daily.     colchicine (COLCRYS) 0.6 MG tablet Take 1 tablet (0.6 mg total) by mouth daily. 30 tablet 2   Continuous Blood Gluc Receiver (FREESTYLE LIBRE 14 DAY READER) DEVI 2 each by Does not apply route daily. 2 each 11   Continuous Blood Gluc Sensor (FREESTYLE LIBRE 14 DAY SENSOR) MISC 2 each by Does not apply route daily. 2 each 11   cyclobenzaprine (FLEXERIL) 5 MG tablet TAKE ONE TABLET BY MOUTH TWICE DAILY AS NEEDED FOR MUSCLE SPASM (Patient taking differently: Take 5 mg by mouth 2 (two) times daily as needed for muscle spasms.) 30 tablet 0   empagliflozin (JARDIANCE) 10 MG TABS tablet Take 1 tablet (10 mg total) by mouth daily before breakfast. 30 tablet 5   fexofenadine (ALLEGRA) 180 MG tablet Take 180 mg by mouth daily.     fluticasone (FLONASE) 50 MCG/ACT nasal spray USE 2 SPRAY(S) IN EACH NOSTRIL ONCE DAILY AS NEEDED 16 g 0   lisinopril (ZESTRIL) 5 MG tablet Take 1 tablet (5 mg total) by mouth daily. 90 tablet 3   metoprolol succinate (TOPROL-XL) 100 MG 24 hr tablet TAKE 1 TABLET EVERY DAY WITH OR IMMMEDIATELY FOLLOWING A MEAL 90 tablet 1   NIFEdipine (PROCARDIA-XL) 30 MG (OSM) 24 hr tablet Take 30  mg by mouth 2 (two) times daily.     NOVOLIN 70/30 RELION (70-30) 100 UNIT/ML injection INJECT 22 UNITS INTO THE SKIN IN THE MORNING AND 20 UNITS IN THE EVENING (Patient taking differently: Inject 20-22 Units into the skin See admin instructions. Inject 22 units subcutaneously in the morning & inject 20 units subcutaneously in the evening) 10 mL 0   oxymetazoline (AFRIN) 0.05 % nasal spray Place 1 spray into both nostrils 2 (two) times daily as needed for congestion.     pioglitazone (ACTOS) 30 MG tablet Take 1 tablet by mouth once daily 90 tablet 0   sulfamethoxazole-trimethoprim (BACTRIM DS) 800-160 MG tablet Take 1 tablet by mouth 2 (two) times daily. 14 tablet 0   No current  facility-administered medications for this visit.     Objective:  BP 128/68   Pulse (!) 56   Temp 97.7 F (36.5 C) (Temporal)   Ht 6' (1.829 m)   Wt 207 lb (93.9 kg)   SpO2 99%   BMI 28.07 kg/m  Gen: NAD, resting comfortably CV: RRR no murmurs rubs or gallops Lungs: CTAB no crackles, wheeze, rhonchi Abdomen: soft/nontender/nondistended/normal bowel sounds. No rebound or guarding.  Ext: trace edema Skin: warm, dry     Assessment and Plan   # PAD-follows with vascular surgery # CAD-follows with cardiology Dr. Burt Knack #hyperlipidemia S: Medication:Aspirin 81 mg, atorvastatin 40 mg Symptoms: some pain in legs when walking but has neuropathy- usually in calves though after couple blocks . No chest pain or shortness of breath  Lab Results  Component Value Date   CHOL 119 10/01/2021   HDL 43.40 10/01/2021   LDLCALC 65 10/01/2021   LDLDIRECT 69.0 03/11/2021   TRIG 53.0 10/01/2021   CHOLHDL 3 10/01/2021  A/P: CAD-asympstomatic- continue current meds PAD-possible claudication- he reports vascular visit in a few months- for now continue current medications   Hyperlipidemia-cholesterol at goal- continue current medications    # Diabetes-tolerate A1c up to 8.5 to avoid lows S: Medication:Actos 30 mg daily, Novolin 70/30 20 units in the morning and 18 units in the evening -Prior glimepiride stopped due to low sugars (also had to reduce insulin level and for similar reasons) -GI side effects on metformin CBGs- uses freestyle libre. 90 day average of 136 but has had 88 low sugars in 90 days with 34 of those being before 6 am- usually right around 70- high 60s it appears Exercise and diet- exercise hard with foot discomfort/callous issues  A/P: mild poor control- not so much from a1c but from low sugars From avs  "If jardiance is covered and reasonable -reduce morning insulin to 18 units and evening to 16 units  If jardiance not covered -only reduce evening insulin to 16  units.  Regardless update me in 2-3 weeks with home readings. Really want to avoid lows"  #hypertension # CKD stage III-GFR typically low 40s - slightly worse last check S: medication: Chlorthalidone 25 mg, Procardia 30 mg twice daily, metoprolol 100 mg extended release -Lisinopril stopped in the past by Dr. Oneida Alar for unclear reasons-we restarted 5 mg and 2023 Home readings #s: 120s or low 130s BP Readings from Last 3 Encounters:  06/02/22 128/68  01/30/22 (!) 110/50  11/13/21 (!) 149/65  A/P: HTN- Controlled. Continue current medications.  CKD III- hopefullys table- update cmp today   # Prior left-sided abdominal pain radiating to the back-had thickened bladder but urine cultures show no UTI-he finished antibiotic course and reports never improved- also plans to ask  GI about this when sees next month. See prior CT report.  Reports as mild low level pain- less constant at lesat now  #Abnormal bowel movement- had a bowel movement and when wiped felt a firm substance at rectum- wiped it out- but was like a firm ball- remained a firm ball but broke open (asks about gallstones- had none on last CT).  Has visit with Dr. Fuller Plan -last colonoscopy was 2021  # Podiatrist- helps him with callouses on his feet   Recommended follow up: Return in about 4 months (around 10/02/2022) for followup or sooner if needed.Schedule b4 you leave. Future Appointments  Date Time Provider [place]  07/09/2022  9:50 AM Ladene Artist, MD [facility]  07/15/2022  1:00 PM Rankin, Clent Demark, MD [facility] None   Lab/Order associations:   ICD-10-CM   1. Essential hypertension  I10     2. Controlled type 2 diabetes mellitus with retinopathy, with long-term current use of insulin, macular edema presence unspecified, unspecified laterality, unspecified retinopathy severity (HCC)  E11.319 Comprehensive metabolic panel   B34.3 Hemoglobin A1c    CBC with Differential/Platelet    3. Hyperlipidemia associated  with type 2 diabetes mellitus ([place])  E11.69    E78.5     4. Stage 3 chronic kidney disease, unspecified whether stage 3a or 3b CKD ([place])  N18.30      Meds ordered this encounter  Medications   empagliflozin (JARDIANCE) 10 MG TABS tablet    Sig: Take 1 tablet (10 mg total) by mouth daily before breakfast.    Dispense:  30 tablet    Refill:  5   Return precautions advised.  Garret Reddish, MD

## 2022-06-16 ENCOUNTER — Other Ambulatory Visit: Payer: Self-pay | Admitting: Family Medicine

## 2022-07-09 ENCOUNTER — Ambulatory Visit (INDEPENDENT_AMBULATORY_CARE_PROVIDER_SITE_OTHER): Payer: Medicare Other | Admitting: Gastroenterology

## 2022-07-09 ENCOUNTER — Encounter: Payer: Self-pay | Admitting: Gastroenterology

## 2022-07-09 VITALS — BP 160/72 | HR 55 | Ht 72.0 in | Wt 208.0 lb

## 2022-07-09 DIAGNOSIS — R10A2 Flank pain, left side: Secondary | ICD-10-CM

## 2022-07-09 DIAGNOSIS — R109 Unspecified abdominal pain: Secondary | ICD-10-CM

## 2022-07-09 DIAGNOSIS — R1032 Left lower quadrant pain: Secondary | ICD-10-CM | POA: Diagnosis not present

## 2022-07-09 DIAGNOSIS — K5900 Constipation, unspecified: Secondary | ICD-10-CM

## 2022-07-09 NOTE — Progress Notes (Signed)
    Assessment     Left flank, LLQ abdominal and lower back pain - musculoskeletal pain Mild constipation   Recommendations    Return to Dr. Yong Channel to consider spine, back, other musculoskeletal etiologies Increase daily water and fiber intake. Colace 1-2 daily  No additional GI evaluation needed at this time   HPI    This is a 82 year old male with left sided abdominal pain, left flank pain, lower back.  Pain worsens with movement, sitting, supine position.  His symptoms do not change with meals or bowel movements.  Colonoscopy, CTAP and blood work outlined below.  He relates mild constipation and sometimes straining with stools but this does not relate to his pain.  Colonoscopy Jan 2021 - Mild diverticulosis in the ascending colon. - Internal hemorrhoids. - The examination was otherwise normal on direct and retroflexion views. - No specimens collected.   Labs / Imaging       Latest Ref Rng & Units 06/02/2022   11:37 AM 01/30/2022   11:34 AM 10/01/2021   11:03 AM  Hepatic Function  Total Protein 6.0 - 8.3 g/dL 7.8  8.1  8.4   Albumin 3.5 - 5.2 g/dL 4.1  4.2  4.2   AST 0 - 37 U/L '19  23  21   '$ ALT 0 - 53 U/L '12  12  11   '$ Alk Phosphatase 39 - 117 U/L 59  59  63   Total Bilirubin 0.2 - 1.2 mg/dL 0.5  0.6  0.5        Latest Ref Rng & Units 06/02/2022   11:37 AM 01/30/2022   11:34 AM 10/01/2021   11:03 AM  CBC  WBC 4.0 - 10.5 K/uL 6.0  6.3  5.6   Hemoglobin 13.0 - 17.0 g/dL 14.3  13.6  14.1   Hematocrit 39.0 - 52.0 % 43.7  42.3  43.1   Platelets 150.0 - 400.0 K/uL 183.0  186.0  193.0    CT AP 01/31/2022 1. Mild diffuse urinary bladder wall thickening which may represent cystitis. Correlation with urinalysis is recommended. 2. Sigmoid diverticulosis. 3. 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 4. Aortic atherosclerosis.  Current Medications, Allergies, Past Medical History, Past Surgical History, Family History and Social History were reviewed in [employer] record.   Physical Exam: General: Well developed, well nourished, no acute distress Head: Normocephalic and atraumatic Eyes: Sclerae anicteric, EOMI Ears: Normal auditory acuity Mouth: No deformities or lesions noted Lungs: Clear throughout to auscultation Heart: Regular rate and rhythm; No murmurs, rubs or bruits Abdomen: Soft, non tender and non distended. No masses, hepatosplenomegaly or hernias noted. Normal Bowel sounds Rectal: not done Musculoskeletal: Symmetrical with no gross deformities  Pulses:  Normal pulses noted Extremities: No edema or deformities noted Neurological: Alert oriented x 4, grossly nonfocal Psychological:  Alert and cooperative. Normal mood and affect   Jeshua Ransford T. Fuller Plan, MD 07/09/2022, 10:08 AM

## 2022-07-09 NOTE — Patient Instructions (Signed)
You can take over the counter Colace 1-2 capsules daily.  Increase your water intake to 6 glasses daily.   Follow up with your primary care physician.   High-Fiber Eating Plan Fiber, also called dietary fiber, is a type of carbohydrate. It is found foods such as fruits, vegetables, whole grains, and beans. A high-fiber diet can have many health benefits. Your health care provider may recommend a high-fiber diet to help: Prevent constipation. Fiber can make your bowel movements more regular. Lower your cholesterol. Relieve the following conditions: Inflammation of veins in the anus (hemorrhoids). Inflammation of specific areas of the digestive tract (uncomplicated diverticulosis). A problem of the large intestine, also called the colon, that sometimes causes pain and diarrhea (irritable bowel syndrome, or IBS). Prevent overeating as part of a weight-loss plan. Prevent heart disease, type 2 diabetes, and certain cancers. What are tips for following this plan? Reading food labels  Check the nutrition facts label on food products for the amount of dietary fiber. Choose foods that have 5 grams of fiber or more per serving. The goals for recommended daily fiber intake include: Men (age 10 or younger): 34-38 g. Men (over age 60): 28-34 g. Women (age 62 or younger): 25-28 g. Women (over age 43): 22-25 g. Your daily fiber goal is _____________ g. Shopping Choose whole fruits and vegetables instead of processed forms, such as apple juice or applesauce. Choose a wide variety of high-fiber foods such as avocados, lentils, oats, and kidney beans. Read the nutrition facts label of the foods you choose. Be aware of foods with added fiber. These foods often have high sugar and sodium amounts per serving. Cooking Use whole-grain flour for baking and cooking. Cook with brown rice instead of white rice. Meal planning Start the day with a breakfast that is high in fiber, such as a cereal that contains 5  g of fiber or more per serving. Eat breads and cereals that are made with whole-grain flour instead of refined flour or white flour. Eat brown rice, bulgur wheat, or millet instead of white rice. Use beans in place of meat in soups, salads, and pasta dishes. Be sure that half of the grains you eat each day are whole grains. General information You can get the recommended daily intake of dietary fiber by: Eating a variety of fruits, vegetables, grains, nuts, and beans. Taking a fiber supplement if you are not able to take in enough fiber in your diet. It is better to get fiber through food than from a supplement. Gradually increase how much fiber you consume. If you increase your intake of dietary fiber too quickly, you may have bloating, cramping, or gas. Drink plenty of water to help you digest fiber. Choose high-fiber snacks, such as berries, raw vegetables, nuts, and popcorn. What foods should I eat? Fruits Berries. Pears. Apples. Oranges. Avocado. Prunes and raisins. Dried figs. Vegetables Sweet potatoes. Spinach. Kale. Artichokes. Cabbage. Broccoli. Cauliflower. Green peas. Carrots. Squash. Grains Whole-grain breads. Multigrain cereal. Oats and oatmeal. Brown rice. Barley. Bulgur wheat. [place]. Quinoa. Bran muffins. Popcorn. Rye wafer crackers. Meats and other proteins Navy beans, kidney beans, and pinto beans. Soybeans. Split peas. Lentils. Nuts and seeds. Dairy Fiber-fortified yogurt. Beverages Fiber-fortified soy milk. Fiber-fortified orange juice. Other foods Fiber bars. The items listed above may not be a complete list of recommended foods and beverages. Contact a dietitian for more information. What foods should I avoid? Fruits Fruit juice. Cooked, strained fruit. Vegetables Fried potatoes. Canned vegetables. Well-cooked vegetables. Grains White  bread. Pasta made with refined flour. White rice. Meats and other proteins Fatty cuts of meat. Fried chicken or fried  fish. Dairy Milk. Yogurt. Cream cheese. Sour cream. Fats and oils Butters. Beverages Soft drinks. Other foods Cakes and pastries. The items listed above may not be a complete list of foods and beverages to avoid. Talk with your dietitian about what choices are best for you. Summary Fiber is a type of carbohydrate. It is found in foods such as fruits, vegetables, whole grains, and beans. A high-fiber diet has many benefits. It can help to prevent constipation, lower blood cholesterol, aid weight loss, and reduce your risk of heart disease, diabetes, and certain cancers. Increase your intake of fiber gradually. Increasing fiber too quickly may cause cramping, bloating, and gas. Drink plenty of water while you increase the amount of fiber you consume. The best sources of fiber include whole fruits and vegetables, whole grains, nuts, seeds, and beans. This information is not intended to replace advice given to you by your health care provider. Make sure you discuss any questions you have with your health care provider. Document Revised: 10/20/2019 Document Reviewed: 10/20/2019 Elsevier Patient Education  [place] providers would like to encourage you to use [facility] to communicate with providers for non-urgent requests or questions.  Due to long hold times on the telephone, sending your provider a message by [facility] may be a faster and more efficient way to get a response.  Please allow 48 business hours for a response.  Please remember that this is for non-urgent requests.  Thank you for choosing me and [facility].  Pricilla Riffle. Dagoberto Ligas., MD., Marval Regal

## 2022-07-15 ENCOUNTER — Encounter (INDEPENDENT_AMBULATORY_CARE_PROVIDER_SITE_OTHER): Payer: Medicare Other | Admitting: Ophthalmology

## 2022-07-15 ENCOUNTER — Encounter (INDEPENDENT_AMBULATORY_CARE_PROVIDER_SITE_OTHER): Payer: Self-pay

## 2022-07-22 DIAGNOSIS — H35352 Cystoid macular degeneration, left eye: Secondary | ICD-10-CM | POA: Diagnosis not present

## 2022-07-22 DIAGNOSIS — E113291 Type 2 diabetes mellitus with mild nonproliferative diabetic retinopathy without macular edema, right eye: Secondary | ICD-10-CM | POA: Diagnosis not present

## 2022-07-22 DIAGNOSIS — E113212 Type 2 diabetes mellitus with mild nonproliferative diabetic retinopathy with macular edema, left eye: Secondary | ICD-10-CM | POA: Diagnosis not present

## 2022-07-22 DIAGNOSIS — H34832 Tributary (branch) retinal vein occlusion, left eye, with macular edema: Secondary | ICD-10-CM | POA: Diagnosis not present

## 2022-07-22 DIAGNOSIS — H35341 Macular cyst, hole, or pseudohole, right eye: Secondary | ICD-10-CM | POA: Diagnosis not present

## 2022-07-23 DIAGNOSIS — I739 Peripheral vascular disease, unspecified: Secondary | ICD-10-CM | POA: Diagnosis not present

## 2022-07-23 DIAGNOSIS — I7300 Raynaud's syndrome without gangrene: Secondary | ICD-10-CM | POA: Diagnosis not present

## 2022-07-23 DIAGNOSIS — E1122 Type 2 diabetes mellitus with diabetic chronic kidney disease: Secondary | ICD-10-CM | POA: Diagnosis not present

## 2022-07-23 DIAGNOSIS — I129 Hypertensive chronic kidney disease with stage 1 through stage 4 chronic kidney disease, or unspecified chronic kidney disease: Secondary | ICD-10-CM | POA: Diagnosis not present

## 2022-07-23 DIAGNOSIS — R609 Edema, unspecified: Secondary | ICD-10-CM | POA: Diagnosis not present

## 2022-07-23 DIAGNOSIS — E559 Vitamin D deficiency, unspecified: Secondary | ICD-10-CM | POA: Diagnosis not present

## 2022-07-23 DIAGNOSIS — N1831 Chronic kidney disease, stage 3a: Secondary | ICD-10-CM | POA: Diagnosis not present

## 2022-08-09 ENCOUNTER — Other Ambulatory Visit: Payer: Self-pay | Admitting: Family Medicine

## 2022-08-20 ENCOUNTER — Ambulatory Visit: Payer: Self-pay

## 2022-08-20 NOTE — Patient Instructions (Signed)
Visit Information  Thank you for taking time to visit with me today. Please don't hesitate to contact me if I can be of assistance to you.   Following are the goals we discussed today:   Goals Addressed             This Visit's Progress    COMPLETED: Care Coordination Activities       Care Coordination Interventions: Provided education on the role of the care coordination team Discussed patient is doing well at this time, does not wish to engage with care coordination team Reviewed upcomming provider appointments, encouraged patient to contact his provider as needed         If you are experiencing a Mental Health or [place] or need someone to talk to, please call 911  Patient verbalizes understanding of instructions and care plan provided today and agrees to view in [place]. Active MyChart status and patient understanding of how to access instructions and care plan via MyChart confirmed with patient.     No further follow up required: Please contact your provider as needed  Daneen Schick, Arita Miss, CDP Social Worker, Certified Dementia Practitioner [place] Coordination [phone]

## 2022-08-20 NOTE — Patient Outreach (Signed)
  Care Coordination   Initial Visit Note   08/20/2022 Name: Matthew [name] MRN: [id number] DOB: 10-17-1940  Matthew [name] is a 82 y.o. year old adult who sees Yong Channel, Brayton Mars, MD for primary care. I spoke with  [name] by phone today.  What matters to the patients health and wellness today?  No concerns, doing well at this time    Goals Addressed             This Visit's Progress    COMPLETED: Care Coordination Activities       Care Coordination Interventions: Provided education on the role of the care coordination team Discussed patient is doing well at this time, does not wish to engage with care coordination team Reviewed upcomming provider appointments, encouraged patient to contact his provider as needed         SDOH assessments and interventions completed:  Yes  SDOH Interventions Today    Flowsheet Row Most Recent Value  SDOH Interventions   Food Insecurity Interventions Intervention Not Indicated  Housing Interventions Intervention Not Indicated  Transportation Interventions Intervention Not Indicated  Utilities Interventions Intervention Not Indicated        Care Coordination Interventions:  Yes, provided   Interventions Today    Flowsheet Row Most Recent Value  Chronic Disease   Chronic disease during today's visit Diabetes, Hypertension (HTN)  General Interventions   General Interventions Discussed/Reviewed Doctor Visits  Doctor Visits Discussed/Reviewed Doctor Visits Reviewed  Education Interventions   Education Provided Provided Education  Provided Verbal Education On Other  [Care Coordination Services]        Follow up plan: No further intervention required.   Encounter Outcome:  Pt. Visit Completed   Daneen Schick, BSW, CDP Social Worker, Certified Dementia Practitioner [place] [facility]  Care Coordination [phone]

## 2022-08-24 DIAGNOSIS — Z23 Encounter for immunization: Secondary | ICD-10-CM | POA: Diagnosis not present

## 2022-08-26 NOTE — Progress Notes (Signed)
Cardiology Clinic Note   Patient Name: Matthew [name] Date of Encounter: 08/27/2022  Primary Care Provider:  Marin Olp, MD Primary Cardiologist:  Sherren Mocha, MD  Patient Profile    Matthew [name] is a 82 y.o. adult with a past medical history of CAD s/p CABG X 3 1997, PAD s/p fem-pop bypasses 2021, hypertension, hyperlipidemia, T2DM, CKD stage III, GERD/PUD who presents to the clinic today for 1 year follow-up.  Past Medical History    Past Medical History:  Diagnosis Date   Cataract, nuclear sclerotic, right eye 11/20/2020   CLAUDICATION 05/14/2007   CORONARY ARTERY DISEASE 12/28/2006   Myoview 8/21: EF 73, normal perfusion, low risk    Cystoid macular edema of left eye 03/20/2021   Diabetes mellitus type II, controlled ([place]) 12/28/2006   Actos '30mg'$ , insulin 70/30 20 units BID, amaryl '1mg'$  previously a1c <8. Worsened due to cold over 5 weeks around 05/2014 eating poorly and eating sugary syrups.   Stomach irritation on metformin.  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   Vitrectomy 11/21/2021   Past Surgical History:  Procedure Laterality Date   ABDOMINAL AORTOGRAM W/LOWER EXTREMITY N/A 02/10/2020   Procedure: ABDOMINAL AORTOGRAM W/LOWER EXTREMITY;  Surgeon: Elam Dutch, MD;  Location: [facility];  Service: Cardiovascular;  Laterality: N/A;   CATARACT EXTRACTION Left 06/21/2020    CATARACT EXTRACTION Right    COLONOSCOPY     CORONARY ARTERY BYPASS GRAFT     ENDARTERECTOMY FEMORAL Left 02/27/2020   Procedure: LEFT COMMON FEMORAL ENDARTERECTOMY;  Surgeon: Elam Dutch, MD;  Location: [facility];  Service: Vascular;  Laterality: Left;   FEMORAL-POPLITEAL BYPASS GRAFT Left 02/27/2020   Procedure: LEFT FEMORAL-ABOVE KNEE POPLITEAL BYPASS WITH NON REVERSED GREATER SAPHENOUS VEIN;  Surgeon: Elam Dutch, MD;  Location: [facility];  Service: Vascular;  Laterality: Left;   FEMORAL-POPLITEAL BYPASS GRAFT Right 04/23/2020   Procedure: BYPASS GRAFT FEMORAL- Above Knee POPLITEAL ARTERY RIGHT Using Right Greater Saphenous Vein;  Surgeon: Elam Dutch, MD;  Location: [facility];  Service: Vascular;  Laterality: Right;   KNEE ARTHROSCOPY  2012   UPPER GASTROINTESTINAL ENDOSCOPY      Allergies  Allergies  Allergen Reactions   Penicillins Hives   Probenecid     Developed lump on chest- went away when he stopped the medicine   Tetracycline Hcl Hives   Allopurinol Itching    History of Present Illness    Matthew [name] has a past medical history of: CAD. S/p CABG x 4 1997: Sequential LIMA to LAD and D2, SVG to D1, sequential SVG to posterior descending and posterolateral branches.  Echo 01/21/2018: EF 55 to 60%.  Grade I DD. Nuclear stress test 02/22/2020: Normal, low risk study. PAD. S/p left femoropopliteal bypass 02/27/2020. S/p right femoropopliteal bypass 04/23/2020. Vascular ultrasound  bilateral lower extremity bypass grafts 11/13/2021: Patent bypass grafts bilaterally. Hypertension. Hyperlipidemia. Lipid panel 10/01/2021: LDL 66, HDL 43, TG 53, total 119. T2DM. CKD stage III. GERD/PUD.  Matthew [name] is a long time patient of Dr. Burt Knack being followed for the above outlined history. He was last seen in the office by Dr. Burt Knack on 08/29/2021. He was doing well at that time and no medication changes were made.   Today, patient is alone today. He denies shortness of  breath or dyspnea on exertion. No chest pain, pressure, or tightness. Denies lower extremity edema, orthopnea, or PND. No palpitations. He stays active by doing yard work including some push mowing. He reports walking causes claudication in his lower legs that resolves with rest. This is not new for him and is not lifestyle limiting. He has no complaints today.    Home Medications    Current Meds  Medication Sig   aspirin EC 81 MG tablet Take 81 mg by mouth daily. Swallow whole.   atorvastatin (LIPITOR) 40 MG tablet Take 1 tablet by mouth once daily   Azelastine HCl 137 MCG/SPRAY SOLN USE 1 SPRAY(S) IN EACH NOSTRIL TWICE DAILY AS DIRECTED   chlorthalidone (HYGROTON) 25 MG tablet Take 1 tablet by mouth once daily   cholecalciferol (VITAMIN D3) 25 MCG (1000 UNIT) tablet Take 1,000 Units by mouth daily.   colchicine 0.6 MG tablet Take 1 tablet by mouth once daily   Continuous Blood Gluc Receiver (FREESTYLE LIBRE 14 DAY READER) DEVI 2 each by Does not apply route daily.   Continuous Blood Gluc Sensor (FREESTYLE LIBRE 14 DAY SENSOR) MISC 2 each by Does not apply route daily.   cyclobenzaprine (FLEXERIL) 5 MG tablet TAKE ONE TABLET BY MOUTH TWICE DAILY AS NEEDED FOR MUSCLE SPASM   fexofenadine (ALLEGRA) 180 MG tablet Take 180 mg by mouth daily.   fluticasone (FLONASE) 50 MCG/ACT nasal spray USE 2 SPRAY(S) IN EACH NOSTRIL ONCE DAILY AS NEEDED   lisinopril (ZESTRIL) 5 MG tablet Take 1 tablet (5 mg total) by mouth daily.   metoprolol succinate (TOPROL-XL) 100 MG 24 hr tablet TAKE 1 TABLET EVERY DAY WITH OR IMMMEDIATELY FOLLOWING A MEAL   NIFEdipine (PROCARDIA-XL) 30 MG (OSM) 24 hr tablet Take 30 mg by mouth 2 (two) times daily.   NOVOLIN 70/30 RELION (70-30) 100 UNIT/ML injection INJECT 22 UNITS INTO THE SKIN IN THE MORNING AND 20 UNITS IN THE EVENING   oxymetazoline (AFRIN) 0.05 % nasal spray Place 1 spray into both nostrils 2 (two) times daily as needed for congestion.   pioglitazone (ACTOS) 30 MG  tablet Take 1 tablet by mouth once daily   sulfamethoxazole-trimethoprim (BACTRIM DS) 800-160 MG tablet Take 1 tablet by mouth 2 (two) times daily.    Family History    Family History  Problem Relation Age of Onset   Diabetes Mother    Hypertension Mother    Stroke Father    Colon cancer Neg Hx    Esophageal cancer Neg Hx    Rectal cancer Neg Hx    Stomach cancer Neg Hx    Matthew Lora "Joe" indicated that Matthew Lora "Joe"'s mother is deceased. Matthew Lora "Joe" indicated that Matthew Lora "Joe"'s father is deceased. Matthew Lora "Joe" indicated that Matthew Lora "Joe"'s maternal grandmother is deceased. Matthew Lora "Joe" indicated that Matthew Lora "Joe"'s maternal grandfather is deceased. Matthew Lora "Joe" indicated that Matthew Lora "Joe"'s paternal grandmother is deceased. Matthew [name].  Matthew "Joe" indicated that Matthew Lora "Joe"'s paternal grandfather is deceased. Matthew Lora "Joe" indicated that the status of Seith Shinder "Joe"'s neg hx is unknown.   Social History    Social History   Socioeconomic History   Marital status: Married    Spouse name: Not on file   Number of children: 2   Years of education: Not on file   Highest education level: Not on file  Occupational History   Occupation: retired  Tobacco Use   Smoking status: Former    Types: Cigarettes    Quit date: 07/09/1968    Years since quitting: 54.1    Passive exposure: Never   Smokeless tobacco: Former    Types: Chew    Quit date: 07/09/1988   Tobacco comments:    minimal use x 10 years   Vaping Use   Vaping Use: Never used  Substance and Sexual Activity   Alcohol use: No    Comment: last was 6 months   Drug use: No   Sexual activity: Not on file  Other Topics Concern   Not on file  Social History Narrative   Married 1983. 2 sons. No grandkids yet.       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       Hobbies: volunteer work, watch sports   Social Determinants of [place] Strain: Not on file  Food Insecurity: No Food Insecurity (08/20/2022)   Hunger Vital Sign    Worried About Running Out of Food in the Last Year: Never true    [place] in the Last Year: Never true  Transportation Needs: No Transportation Needs (08/20/2022)   PRAPARE - Hydrologist (Medical): No    Lack of Transportation (Non-Medical): No  Physical Activity: Not on file  Stress: Not on file  Social Connections: Not on file  Intimate Partner Violence: Not on file     Review of Systems    General:  No chills, fever, night sweats or weight changes.  Cardiovascular:  No chest pain, dyspnea on exertion, edema, orthopnea, palpitations, paroxysmal nocturnal dyspnea. Dermatological: No rash, lesions/masses Respiratory: No cough, dyspnea Urologic: No hematuria, dysuria Abdominal:   No nausea, vomiting, diarrhea, bright red blood per rectum, melena, or hematemesis Neurologic:  No visual changes, weakness, changes in mental status. All other systems reviewed and are otherwise negative except as noted above.  Physical Exam    VS:  BP (!) 126/50   Pulse (!) 54   Ht '5\' 11"'$  (1.803 m)   Wt 204 lb 9.6 oz (92.8 kg)   SpO2 99%   BMI 28.54 kg/m  , BMI Body mass index is 28.54 kg/m. GEN: Well nourished, well developed, in no acute distress. HEENT: Normal. Neck: Supple, no JVD, carotid bruits, or masses. Cardiac: RRR, no murmurs, rubs, or gallops. No clubbing, cyanosis, edema.  Radials/DP/PT 2+ and equal bilaterally.  Respiratory:  Respirations regular and unlabored, clear to auscultation bilaterally. GI: Soft, nontender, nondistended. MS: No deformity or atrophy. Skin: Warm and dry, no rash. Neuro: Strength and sensation are intact. Psych: Normal affect.  Accessory Clinical Findings    Recent Labs: 06/02/2022: ALT 12; BUN  39; Creatinine, Ser 1.70; Hemoglobin 14.3; Platelets 183.0; Potassium 4.8; Sodium 141   Recent Lipid Panel    Component Value Date/Time   CHOL 119 10/01/2021 1103   TRIG 53.0 10/01/2021 1103  HDL 43.40 10/01/2021 1103   CHOLHDL 3 10/01/2021 1103   VLDL 10.6 10/01/2021 1103   LDLCALC 65 10/01/2021 1103   LDLDIRECT 69.0 03/11/2021 1116    ECG personally reviewed by me today: Sinus brady, 1st degree AV block, PACs, rate 54 bpm  No significant changes from 02/15/2020.    Assessment & Plan   CAD. S/p CABG x 4 1997. Echo July 2019 with EF 55-60%, grade I DD. Nuclear stress test August 2021 was a normal, low risk study. Patient denies chest pain, pressure or tightness. Continue aspirin, atorvastatin, metoprolol.  PAD/claudication. S/p fem-pop bypass bilaterally 2021. Vascular US May 2023 showed patent stents. Patient reports continued claudication with walking that resolves with rest. This is not a new issue for him and is not lifestyle limiting.  Continue aspirin and Lipitor. Patient is followed by vascular surgery.  Hypertension. BP today 126/50.Marland Kitchen Patient denies dizziness or headaches. Continue lisinopril, chlorthalidone, metoprolol, nifedipine.  Hyperlipidemia. LDL April 2023 66, at goal. Continue atorvastatin.      Disposition: Return in 1 year or sooner as needed.    Justice Britain. Shakiyla Kook, DNP, NP-C     08/27/2022, 4:24 PM [address] Office [phone] Fax [phone]

## 2022-08-27 ENCOUNTER — Ambulatory Visit: Payer: Medicare Other | Attending: Physician Assistant | Admitting: Student

## 2022-08-27 ENCOUNTER — Encounter: Payer: Self-pay | Admitting: Physician Assistant

## 2022-08-27 VITALS — BP 126/50 | HR 54 | Ht 71.0 in | Wt 204.6 lb

## 2022-08-27 DIAGNOSIS — E785 Hyperlipidemia, unspecified: Secondary | ICD-10-CM | POA: Diagnosis not present

## 2022-08-27 DIAGNOSIS — I10 Essential (primary) hypertension: Secondary | ICD-10-CM | POA: Diagnosis not present

## 2022-08-27 DIAGNOSIS — I2510 Atherosclerotic heart disease of native coronary artery without angina pectoris: Secondary | ICD-10-CM | POA: Diagnosis not present

## 2022-08-27 DIAGNOSIS — I739 Peripheral vascular disease, unspecified: Secondary | ICD-10-CM | POA: Diagnosis not present

## 2022-08-27 NOTE — Patient Instructions (Signed)
Medication Instructions:  Your physician recommends that you continue on your current medications as directed. Please refer to the Current Medication list given to you today.  *If you need a refill on your cardiac medications before your next appointment, please call your pharmacy*   Lab Work: None ordered  If you have labs (blood work) drawn today and your tests are completely normal, you will receive your results only by: [place] (if you have MyChart) OR A paper copy in the mail If you have any lab test that is abnormal or we need to change your treatment, we will call you to review the results.   Testing/Procedures: None ordered   Follow-Up: At [facility], you and your health needs are our priority.  As part of our continuing mission to provide you with exceptional heart care, we have created designated Provider Care Teams.  These Care Teams include your primary Cardiologist (physician) and Advanced Practice Providers (APPs -  Physician Assistants and Nurse Practitioners) who all work together to provide you with the care you need, when you need it.  We recommend signing up for the patient portal called "MyChart".  Sign up information is provided on this After Visit Summary.  MyChart is used to connect with patients for Virtual Visits (Telemedicine).  Patients are able to view lab/test results, encounter notes, upcoming appointments, etc.  Non-urgent messages can be sent to your provider as well.   To learn more about what you can do with MyChart, go to [email or web address].    Your next appointment:   1 year(s)  Provider:   Sherren Mocha, MD     Other Instructions

## 2022-09-02 DIAGNOSIS — H34832 Tributary (branch) retinal vein occlusion, left eye, with macular edema: Secondary | ICD-10-CM | POA: Diagnosis not present

## 2022-09-02 DIAGNOSIS — E113291 Type 2 diabetes mellitus with mild nonproliferative diabetic retinopathy without macular edema, right eye: Secondary | ICD-10-CM | POA: Diagnosis not present

## 2022-09-02 DIAGNOSIS — H35341 Macular cyst, hole, or pseudohole, right eye: Secondary | ICD-10-CM | POA: Diagnosis not present

## 2022-09-15 DIAGNOSIS — H0102B Squamous blepharitis left eye, upper and lower eyelids: Secondary | ICD-10-CM | POA: Diagnosis not present

## 2022-09-15 DIAGNOSIS — H0102A Squamous blepharitis right eye, upper and lower eyelids: Secondary | ICD-10-CM | POA: Diagnosis not present

## 2022-09-15 DIAGNOSIS — H43823 Vitreomacular adhesion, bilateral: Secondary | ICD-10-CM | POA: Diagnosis not present

## 2022-09-15 DIAGNOSIS — E119 Type 2 diabetes mellitus without complications: Secondary | ICD-10-CM | POA: Diagnosis not present

## 2022-09-15 DIAGNOSIS — Z794 Long term (current) use of insulin: Secondary | ICD-10-CM | POA: Diagnosis not present

## 2022-09-15 DIAGNOSIS — Z961 Presence of intraocular lens: Secondary | ICD-10-CM | POA: Diagnosis not present

## 2022-09-15 LAB — HM DIABETES EYE EXAM

## 2022-09-17 ENCOUNTER — Other Ambulatory Visit: Payer: Self-pay | Admitting: Family Medicine

## 2022-09-19 ENCOUNTER — Encounter: Payer: Self-pay | Admitting: Family Medicine

## 2022-09-19 ENCOUNTER — Ambulatory Visit (INDEPENDENT_AMBULATORY_CARE_PROVIDER_SITE_OTHER): Payer: Medicare Other | Admitting: Family Medicine

## 2022-09-19 VITALS — BP 128/62 | HR 58 | Temp 98.1°F | Resp 16 | Ht 72.0 in | Wt 206.0 lb

## 2022-09-19 DIAGNOSIS — E113213 Type 2 diabetes mellitus with mild nonproliferative diabetic retinopathy with macular edema, bilateral: Secondary | ICD-10-CM | POA: Diagnosis not present

## 2022-09-19 DIAGNOSIS — Z794 Long term (current) use of insulin: Secondary | ICD-10-CM | POA: Diagnosis not present

## 2022-09-19 DIAGNOSIS — E785 Hyperlipidemia, unspecified: Secondary | ICD-10-CM | POA: Diagnosis not present

## 2022-09-19 DIAGNOSIS — I2510 Atherosclerotic heart disease of native coronary artery without angina pectoris: Secondary | ICD-10-CM

## 2022-09-19 DIAGNOSIS — E1169 Type 2 diabetes mellitus with other specified complication: Secondary | ICD-10-CM | POA: Diagnosis not present

## 2022-09-19 DIAGNOSIS — I10 Essential (primary) hypertension: Secondary | ICD-10-CM | POA: Diagnosis not present

## 2022-09-19 DIAGNOSIS — N183 Chronic kidney disease, stage 3 unspecified: Secondary | ICD-10-CM | POA: Diagnosis not present

## 2022-09-19 DIAGNOSIS — E11319 Type 2 diabetes mellitus with unspecified diabetic retinopathy without macular edema: Secondary | ICD-10-CM | POA: Diagnosis not present

## 2022-09-19 LAB — CBC WITH DIFFERENTIAL/PLATELET
Basophils Absolute: 0 10*3/uL (ref 0.0–0.1)
Basophils Relative: 0.4 % (ref 0.0–3.0)
Eosinophils Absolute: 0.3 10*3/uL (ref 0.0–0.7)
Eosinophils Relative: 6.7 % — ABNORMAL HIGH (ref 0.0–5.0)
HCT: 43.9 % (ref 39.0–52.0)
Hemoglobin: 14.3 g/dL (ref 13.0–17.0)
Lymphocytes Relative: 20.5 % (ref 12.0–46.0)
Lymphs Abs: 1 10*3/uL (ref 0.7–4.0)
MCHC: 32.5 g/dL (ref 30.0–36.0)
MCV: 89.7 fl (ref 78.0–100.0)
Monocytes Absolute: 0.5 10*3/uL (ref 0.1–1.0)
Monocytes Relative: 10 % (ref 3.0–12.0)
Neutro Abs: 3.2 10*3/uL (ref 1.4–7.7)
Neutrophils Relative %: 62.4 % (ref 43.0–77.0)
Platelets: 186 10*3/uL (ref 150.0–400.0)
RBC: 4.89 Mil/uL (ref 4.22–5.81)
RDW: 14 % (ref 11.5–15.5)
WBC: 5.1 10*3/uL (ref 4.0–10.5)

## 2022-09-19 LAB — LIPID PANEL
Cholesterol: 116 mg/dL (ref 0–200)
HDL: 42.9 mg/dL (ref >39.00)
LDL Cholesterol: 59 mg/dL (ref 0–99)
NonHDL: 73.35
Total CHOL/HDL Ratio: 3
Triglycerides: 74 mg/dL (ref 0.0–149.0)
VLDL: 14.8 mg/dL (ref 0.0–40.0)

## 2022-09-19 LAB — COMPREHENSIVE METABOLIC PANEL
ALT: 11 U/L (ref 0–53)
AST: 16 U/L (ref 0–37)
Albumin: 4.1 g/dL (ref 3.5–5.2)
Alkaline Phosphatase: 58 U/L (ref 39–117)
BUN: 35 mg/dL — ABNORMAL HIGH (ref 6–23)
CO2: 30 mEq/L (ref 19–32)
Calcium: 9.8 mg/dL (ref 8.4–10.5)
Chloride: 107 mEq/L (ref 96–112)
Creatinine, Ser: 1.83 mg/dL — ABNORMAL HIGH (ref 0.40–1.50)
GFR: 34.53 mL/min — ABNORMAL LOW (ref >60.00)
Glucose, Bld: 68 mg/dL — ABNORMAL LOW (ref 70–99)
Potassium: 5 mEq/L (ref 3.5–5.1)
Sodium: 144 mEq/L (ref 135–145)
Total Bilirubin: 0.4 mg/dL (ref 0.2–1.2)
Total Protein: 8 g/dL (ref 6.0–8.3)

## 2022-09-19 LAB — POCT GLYCOSYLATED HEMOGLOBIN (HGB A1C): Hemoglobin A1C: 7 % — AB (ref 4.0–5.6)

## 2022-09-19 LAB — MICROALBUMIN / CREATININE URINE RATIO
Creatinine,U: 93.6 mg/dL
Microalb Creat Ratio: 11.9 mg/g (ref 0.0–30.0)
Microalb, Ur: 11.1 mg/dL — ABNORMAL HIGH (ref 0.0–1.9)

## 2022-09-19 LAB — HEMOGLOBIN A1C: Hgb A1c MFr Bld: 7.2 % — ABNORMAL HIGH (ref 4.6–6.5)

## 2022-09-19 NOTE — Patient Instructions (Addendum)
Team please update chart through [facility] with his last covid shot  Let us know when you got tetnaus shot at pharmacy - if you have not had this within 10 years please receive this and let us know   a1c is well controlled on poc testing today BUT he is often getting lows 2 30-4 am- we discussed cutting down evening insulin to 14-16 units and him updating me if still getting any lows in next 2-3 weeks. Continue actos  Please stop by lab before you go If you have mychart- we will send your results within 3 business days of [place] receiving them.  If you do not have mychart- we will call you about results within 5 business days of [place] receiving them.  *please also note that you will see labs on mychart as soon as they post. I will later go in and write notes on them- will say "notes from Dr. Yong Channel"   Recommended follow up: Return in about 4 months (around 01/19/2023) for followup or sooner if needed.Schedule b4 you leave.

## 2022-09-19 NOTE — Progress Notes (Addendum)
Phone [phone] In person visit   Subjective:   Matthew [name] is a 82 y.o. 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    Priority: High   PAD (peripheral artery disease) (HCC)-with claudication 05/14/2007    Priority: High   Mild nonproliferative diabetic retinopathy of both eyes ([place]) 12/28/2006    Priority: High    Coronary artery disease s/p CABG 1997 12/28/2006    Priority: High   Diabetes mellitus type II, controlled ([place]) 12/28/2006    Priority: High   Aortic atherosclerosis ([place]) 01/31/2022    Priority: Medium    Cervical arthritis ([place]) 10/25/2014    Priority: Medium    Erectile dysfunction 08/13/2010    Priority: Medium    CKD (chronic kidney disease), stage III ([place]) 09/15/2008    Priority: Medium    Hyperlipidemia associated with type 2 diabetes mellitus ([place]) 12/28/2006    Priority: Medium    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    Priority: Medium    Essential hypertension 12/28/2006    Priority: Medium    Allergic rhinitis 11/18/2016    Priority: Low   Pulmonary nodule seen on imaging study 05/17/2012    Priority: Low   Both eyes affected by mild nonproliferative diabetic retinopathy with macular edema, associated with type 2 diabetes mellitus ([place]) 09/19/2022   Full thickness macular hole, right 11/13/2021   Cystoid macular edema of left eye 03/20/2021   Type 2 macular telangiectasis, 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 Swallow whole.     atorvastatin (LIPITOR) 40 MG tablet Take 1 tablet by mouth once daily  90 tablet 0   Azelastine HCl 137 MCG/SPRAY SOLN USE 1 SPRAY(S) IN EACH NOSTRIL TWICE DAILY AS DIRECTED 30 mL 0   chlorthalidone (HYGROTON) 25 MG tablet Take 1 tablet by mouth once daily 90 tablet 0   cholecalciferol (VITAMIN D3) 25 MCG (1000 UNIT) tablet Take 1,000 Units by mouth daily.     colchicine 0.6 MG tablet Take 1 tablet by mouth once daily 30 tablet 0   Continuous Blood Gluc Receiver (FREESTYLE LIBRE 14 DAY READER) DEVI 2 each by Does not apply route daily. 2 each 11   Continuous Blood Gluc Sensor (FREESTYLE LIBRE 14 DAY SENSOR) MISC 2 each by Does not apply route daily. 2 each 11   cyclobenzaprine (FLEXERIL) 5 MG tablet TAKE ONE TABLET BY MOUTH TWICE DAILY AS NEEDED FOR MUSCLE SPASM 30 tablet 0   fexofenadine (ALLEGRA) 180 MG tablet Take 180 mg by mouth daily.     fluticasone (FLONASE) 50 MCG/ACT nasal spray USE 2 SPRAY(S) IN EACH NOSTRIL ONCE DAILY AS NEEDED 16 g 0   lisinopril (ZESTRIL) 5 MG tablet Take 1 tablet (5 mg total) by mouth daily. 90 tablet 3   metoprolol succinate (TOPROL-XL) 100 MG 24 hr tablet TAKE 1 TABLET EVERY DAY WITH OR IMMMEDIATELY FOLLOWING A MEAL 90 tablet 3   NIFEdipine (PROCARDIA-XL) 30 MG (OSM) 24 hr tablet Take 30 mg by mouth 2 (two) times daily.     NOVOLIN 70/30  RELION (70-30) 100 UNIT/ML injection INJECT 22 UNITS INTO THE SKIN IN THE MORNING AND 20 UNITS IN THE EVENING 10 mL 0   oxymetazoline (AFRIN) 0.05 % nasal spray Place 1 spray into both nostrils 2 (two) times daily as needed for congestion.     pioglitazone (ACTOS) 30 MG tablet Take 1 tablet by mouth once daily 90 tablet 0   sulfamethoxazole-trimethoprim (BACTRIM DS) 800-160 MG tablet Take 1 tablet by mouth 2 (two) times daily. 14 tablet 0   No current facility-administered medications for this visit.     Objective:  BP 128/62   Pulse (!) 58   Temp 98.1 F (36.7 C) (Temporal)   Resp 16   Ht 6' (1.829 m)   Wt 206 lb (93.4 kg)   SpO2 100%   BMI 27.94 kg/m  Gen: NAD, resting comfortably CV:  RRR no murmurs rubs or gallops- not bradycardic on my exam Lungs: CTAB no crackles, wheeze, rhonchi Ext: trace edema Skin: warm, dry     Assessment and Plan   # PAD-follows with vascular surgery # CAD-follows with cardiology Dr. Excell Seltzer #hyperlipidemia S: Medication:Aspirin 81 mg, atorvastatin 40 mg Symptoms: no chest pain or shortness of breath, still with claudication that improves with rest . Occasional tremulousness in left arm but sparing(no relation to sugars) Lab Results  Component Value Date   CHOL 119 10/01/2021   HDL 43.40 10/01/2021   LDLCALC 65 10/01/2021   LDLDIRECT 69.0 03/11/2021   TRIG 53.0 10/01/2021   CHOLHDL 3 10/01/2021  A/P: CAD-asymptomatic - continue current medications  PAD- stable claudication- continue current medications   Hyperlipidemia-hopefully at goal- update lipids   # Diabetes-tolerate A1c up to 8.5 to avoid lows S: Medication:Actos 30 mg daily, Novolin 70/30 with 22 units before a big meal, and at night 16-18 units  -has not started jardiance -Prior glimepiride stopped due to low sugars (also had to reduce insulin level and for similar reasons) -GI side effects on metformin CBGs- uses freestyle libre.  Addendum (this line only) on 11/09/22- pt uses CGM freestyle libre  Lab Results  Component Value Date   HGBA1C 7.0 (A) 09/19/2022   HGBA1C 7.3 (H) 06/02/2022   HGBA1C 8.2 (H) 01/30/2022   A/P: a1c is well controlled on poc testing today BUT he is often getting lows 2 30-4 am- we discussed cutting down evening insulin to 14-16 units and him updating me if still getting any lows in next 2-3 weeks. Continue actos- just checked urine in aug 2023 -consider jardiance if a1c goes back up -reports stable retinopathy as well- continue current medications   #hypertension # CKD stage III-GFR typically low 61s - sees [facility] Dr. Ronalee Belts S: medication: Chlorthalidone 25 mg, Procardia 30 mg twice daily, metoprolol 100 mg extended  release -Lisinopril stopped in the past by Dr. Darrick Penna for unclear reasons-we restarted 5 mg in 2023 -usually 120s/70s with highs up to 140-145 BP Readings from Last 3 Encounters:  09/19/22 128/62  08/27/22 (!) 126/50  07/09/22 (!) 160/72  A/P: hypertension - stable- continue current medicines  CKD III- update cmp with labs- microalbumin/cr ratio trending up previously- back on lisinopril hoping for at least stability  Recommended follow up: Return in about 4 months (around 01/19/2023) for followup or sooner if needed.Schedule b4 you leave.  Lab/Order associations: Not fasting   ICD-10-CM   1. Controlled type 2 diabetes mellitus with retinopathy, with long-term current use of insulin, macular edema presence unspecified, unspecified laterality, unspecified retinopathy severity (HCC)  E11.319  POCT HgB A1C   Z79.4 CBC with Differential/Platelet    Comprehensive metabolic panel    Lipid panel    Hemoglobin A1c    Microalbumin / creatinine urine ratio    2. Both eyes affected by mild nonproliferative diabetic retinopathy with macular edema, associated with type 2 diabetes mellitus (HCC)  Z61.0960     3. Stage 3 chronic kidney disease, unspecified whether stage 3a or 3b CKD (HCC) Chronic N18.30     4. Coronary artery disease involving native coronary artery of native heart without angina pectoris  I25.10     5. Essential hypertension  I10     6. Hyperlipidemia associated with type 2 diabetes mellitus (HCC)  E11.69    E78.5      No orders of the defined types were placed in this encounter.  Return precautions advised.  Tana Conch, MD

## 2022-10-07 DIAGNOSIS — H34832 Tributary (branch) retinal vein occlusion, left eye, with macular edema: Secondary | ICD-10-CM | POA: Diagnosis not present

## 2022-10-07 DIAGNOSIS — E113291 Type 2 diabetes mellitus with mild nonproliferative diabetic retinopathy without macular edema, right eye: Secondary | ICD-10-CM | POA: Diagnosis not present

## 2022-10-07 DIAGNOSIS — E113212 Type 2 diabetes mellitus with mild nonproliferative diabetic retinopathy with macular edema, left eye: Secondary | ICD-10-CM | POA: Diagnosis not present

## 2022-10-07 DIAGNOSIS — H35341 Macular cyst, hole, or pseudohole, right eye: Secondary | ICD-10-CM | POA: Diagnosis not present

## 2022-10-07 LAB — HM DIABETES EYE EXAM

## 2022-10-08 ENCOUNTER — Ambulatory Visit: Payer: Medicare Other | Admitting: Family Medicine

## 2022-10-24 ENCOUNTER — Other Ambulatory Visit: Payer: Self-pay | Admitting: Family Medicine

## 2022-10-29 ENCOUNTER — Encounter: Payer: Self-pay | Admitting: Family Medicine

## 2022-10-29 ENCOUNTER — Ambulatory Visit (INDEPENDENT_AMBULATORY_CARE_PROVIDER_SITE_OTHER): Payer: Medicare Other | Admitting: Family Medicine

## 2022-10-29 VITALS — BP 138/60 | HR 56 | Temp 98.3°F | Resp 16 | Ht 72.0 in | Wt 205.4 lb

## 2022-10-29 DIAGNOSIS — I10 Essential (primary) hypertension: Secondary | ICD-10-CM | POA: Diagnosis not present

## 2022-10-29 NOTE — Progress Notes (Signed)
Subjective:     Patient ID: Matthew [name], adult    DOB: 18-Jul-1940, 82 y.o.   MRN: [id number]  Chief Complaint  Patient presents with   Hypertension    190/60's to 130's/80's     HPI  HYPERTENSION-lisinopril 5 mg, chlorthalidone 25, metoprolol 100, nifedipine 30 twice daily.  Blood pressure "bouncing around"  was fine in Feb/March.  Started study at [facility] CONTINUOUS GLUCOSE MONITOR and ziopatch(atherolsclerosis study).   Blood pressure was 191/67 at study then 150, then 183/80.  Found old monitor and 130/70's.  When checked meter here, 20 points off.    Before study, doing yard work, and left arm and chest "felt a little funny"-but had lifted riding mower so thought pulled muscle.  Doesn't do a lot of exercise, but no dyspnea on exertion/chest pain w/ambulation(legs do-known peripheral arterial disease). Allergies-afrin at least 1 x/week(s).   Health Maintenance Due  Topic Date Due   DTaP/Tdap/Td (3 - Tdap) 03/03/2018   Medicare Annual Wellness (AWV)  11/05/2021   COVID-19 Vaccine (6 - 2023-24 season) 02/28/2022    Past Medical History:  Diagnosis Date   Cataract, nuclear sclerotic, right eye 11/20/2020   CLAUDICATION 05/14/2007   CORONARY ARTERY DISEASE 12/28/2006   Myoview 8/21: EF 73, normal perfusion, low risk    Cystoid macular edema of left eye 03/20/2021   Diabetes mellitus type II, controlled (HCC) 12/28/2006   Actos 30mg , insulin 70/30 20 units BID, amaryl 1mg  previously a1c <8. Worsened due to cold over 5 weeks around 05/2014 eating poorly and eating sugary syrups.   Stomach irritation on metformin.  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   Vitrectomy 11/21/2021    Past Surgical History:  Procedure Laterality Date   ABDOMINAL AORTOGRAM W/LOWER EXTREMITY N/A 02/10/2020   Procedure: ABDOMINAL AORTOGRAM W/LOWER EXTREMITY;  Surgeon: Sherren Kerns, MD;  Location: [facility];  Service: Cardiovascular;  Laterality: N/A;   CATARACT EXTRACTION Left 06/21/2020   CATARACT EXTRACTION Right    COLONOSCOPY     CORONARY ARTERY BYPASS GRAFT     ENDARTERECTOMY FEMORAL Left 02/27/2020   Procedure: LEFT COMMON FEMORAL ENDARTERECTOMY;  Surgeon: Sherren Kerns, MD;  Location: [facility] OR;  Service: Vascular;  Laterality: Left;   FEMORAL-POPLITEAL BYPASS GRAFT Left 02/27/2020   Procedure: LEFT FEMORAL-ABOVE KNEE POPLITEAL BYPASS WITH NON REVERSED GREATER SAPHENOUS VEIN;  Surgeon: Sherren Kerns, MD;  Location: [facility] OR;  Service: Vascular;  Laterality: Left;   FEMORAL-POPLITEAL BYPASS GRAFT Right 04/23/2020   Procedure: BYPASS GRAFT FEMORAL- Above Knee POPLITEAL ARTERY RIGHT Using Right Greater Saphenous Vein;  Surgeon: Sherren Kerns, MD;  Location: [facility] OR;  Service: Vascular;  Laterality: Right;   KNEE ARTHROSCOPY  2012   UPPER GASTROINTESTINAL ENDOSCOPY       Current Outpatient Medications:    aspirin  EC 81 MG tablet, Take 81 mg by mouth daily. Swallow whole., Disp: , Rfl:    atorvastatin (LIPITOR) 40 MG tablet, Take 1 tablet by mouth once daily, Disp: 90 tablet, Rfl: 0   Azelastine HCl 137 MCG/SPRAY SOLN, USE 1 SPRAY(S) IN EACH NOSTRIL TWICE DAILY AS DIRECTED, Disp: 30 mL, Rfl: 0   chlorthalidone (HYGROTON) 25 MG tablet, Take 1 tablet by mouth once daily, Disp: 90 tablet, Rfl: 0   cholecalciferol (VITAMIN D3) 25 MCG (1000 UNIT) tablet, Take 1,000 Units by mouth daily., Disp: ,  Rfl:    colchicine 0.6 MG tablet, Take 1 tablet by mouth once daily, Disp: 30 tablet, Rfl: 0   Continuous Blood Gluc Receiver (FREESTYLE LIBRE 14 DAY READER) DEVI, 2 each by Does not apply route daily., Disp: 2 each, Rfl: 11   Continuous Blood Gluc Sensor (FREESTYLE LIBRE 14 DAY SENSOR) MISC, 2 each by Does not apply route daily., Disp: 2 each, Rfl: 11   cyclobenzaprine (FLEXERIL) 5 MG tablet, TAKE ONE TABLET BY MOUTH TWICE DAILY AS NEEDED FOR MUSCLE SPASM, Disp: 30 tablet, Rfl: 0   fexofenadine (ALLEGRA) 180 MG tablet, Take 180 mg by mouth daily., Disp: , Rfl:    fluticasone (FLONASE) 50 MCG/ACT nasal spray, USE 2 SPRAY(S) IN EACH NOSTRIL ONCE DAILY AS NEEDED, Disp: 16 g, Rfl: 0   lisinopril (ZESTRIL) 5 MG tablet, Take 1 tablet (5 mg total) by mouth daily., Disp: 90 tablet, Rfl: 3   metoprolol succinate (TOPROL-XL) 100 MG 24 hr tablet, TAKE 1 TABLET EVERY DAY WITH OR IMMMEDIATELY FOLLOWING A MEAL, Disp: 90 tablet, Rfl: 3   NIFEdipine (PROCARDIA-XL) 30 MG (OSM) 24 hr tablet, Take 30 mg by mouth 2 (two) times daily., Disp: , Rfl:    NOVOLIN 70/30 RELION (70-30) 100 UNIT/ML injection, INJECT 22 UNITS INTO THE SKIN IN THE MORNING AND 20 UNITS IN THE EVENING, Disp: 10 mL, Rfl: 0   oxymetazoline (AFRIN) 0.05 % nasal spray, Place 1 spray into both nostrils 2 (two) times daily as needed for congestion., Disp: , Rfl:    pioglitazone (ACTOS) 30 MG tablet, Take 1 tablet by mouth once daily, Disp: 90 tablet, Rfl: 0   sulfamethoxazole-trimethoprim (BACTRIM DS) 800-160 MG tablet, Take 1 tablet by mouth 2 (two) times daily., Disp: 14 tablet, Rfl: 0  Allergies  Allergen Reactions   Penicillins Hives   Probenecid     Developed lump on chest- went away when he stopped the medicine   Tetracycline Hcl Hives   Allopurinol Itching   ROS neg/noncontributory except as noted HPI/below      Objective:     BP 138/60 (BP Location: Left Arm, Patient Position: Sitting, Cuff Size: Normal)   Pulse (!) 56   Temp  98.3 F (36.8 C) (Temporal)   Resp 16   Ht 6' (1.829 m)   Wt 205 lb 6.4 oz (93.2 kg)   SpO2 99%   BMI 27.86 kg/m  Wt Readings from Last 3 Encounters:  10/29/22 205 lb 6.4 oz (93.2 kg)  09/19/22 206 lb (93.4 kg)  08/27/22 204 lb 9.6 oz (92.8 kg)    Physical Exam   Gen: WDWN NAD   patient cuff 150/81   140/58 ours right,   patient left 157/81, ours 138/60 HEENT: NCAT, conjunctiva not injected, sclera nonicteric CARDIAC: brady RRR, S1S2+, no murmur. EXT:  + left lower extremity edema MSK: no gross abnormalities.  NEURO: A&O x3.  CN II-XII intact.  PSYCH: normal mood. Good eye contact  Assessment & Plan:  Essential hypertension   Hypertension-chronic.  Not controlled recently(high at [facility] study).  Home cuff reading high.  Has been controlled at previous visits here.  Don't want to over, nor under correct.  Advised to put new batteries in cuff and monitor and bring cuff back to appointment for check in 2 days.  Consider getting new cuff.  Not sure if the day of the study was just a fluke?    If symptoms, let us know.   Angelena Sole, MD

## 2022-10-29 NOTE — Patient Instructions (Signed)
It was very nice to see you today!  New batteries, come back Friday   PLEASE NOTE:  If you had any lab tests please let us know if you have not heard back within a few days. You may see your results on MyChart before we have a chance to review them but we will give you a call once they are reviewed by [place]. If we ordered any referrals today, please let us know if you have not heard from their office within the next week.   Please try these tips to maintain a healthy lifestyle:  Eat most of your calories during the day when you are active. Eliminate processed foods including packaged sweets (pies, cakes, cookies), reduce intake of potatoes, white bread, white pasta, and white rice. Look for whole grain options, oat flour or almond flour.  Each meal should contain half fruits/vegetables, one quarter protein, and one quarter carbs (no bigger than a computer mouse).  Cut down on sweet beverages. This includes juice, soda, and sweet tea. Also watch fruit intake, though this is a healthier sweet option, it still contains natural sugar! Limit to 3 servings daily.  Drink at least 1 glass of water with each meal and aim for at least 8 glasses per day  Exercise at least 150 minutes every week.

## 2022-10-31 ENCOUNTER — Encounter: Payer: Self-pay | Admitting: Family Medicine

## 2022-10-31 ENCOUNTER — Ambulatory Visit (INDEPENDENT_AMBULATORY_CARE_PROVIDER_SITE_OTHER): Payer: Medicare Other | Admitting: Family Medicine

## 2022-10-31 VITALS — BP 140/62 | HR 55 | Temp 98.2°F | Resp 18 | Ht 72.0 in | Wt 202.2 lb

## 2022-10-31 DIAGNOSIS — I10 Essential (primary) hypertension: Secondary | ICD-10-CM | POA: Diagnosis not present

## 2022-10-31 NOTE — Patient Instructions (Signed)
It was very nice to see you today!  Stay active.  Keep brain engaged  Up to you if want to get new cuff or not.    PLEASE NOTE:  If you had any lab tests please let us know if you have not heard back within a few days. You may see your results on MyChart before we have a chance to review them but we will give you a call once they are reviewed by [place]. If we ordered any referrals today, please let us know if you have not heard from their office within the next week.   Please try these tips to maintain a healthy lifestyle:  Eat most of your calories during the day when you are active. Eliminate processed foods including packaged sweets (pies, cakes, cookies), reduce intake of potatoes, white bread, white pasta, and white rice. Look for whole grain options, oat flour or almond flour.  Each meal should contain half fruits/vegetables, one quarter protein, and one quarter carbs (no bigger than a computer mouse).  Cut down on sweet beverages. This includes juice, soda, and sweet tea. Also watch fruit intake, though this is a healthier sweet option, it still contains natural sugar! Limit to 3 servings daily.  Drink at least 1 glass of water with each meal and aim for at least 8 glasses per day  Exercise at least 150 minutes every week.

## 2022-10-31 NOTE — Progress Notes (Signed)
Subjective:     Patient ID: Matthew [name], adult    DOB: 11/16/1940, 82 y.o.   MRN: [id number]  Chief Complaint  Patient presents with   Medical Management of Chronic Issues    Follow-up on HTN Home monitor reads 166/87    HPI HYPERTENSION-chnged batteries in monitor.  Taking medications.  No symptoms  Health Maintenance Due  Topic Date Due   DTaP/Tdap/Td (3 - Tdap) 03/03/2018   Medicare Annual Wellness (AWV)  11/05/2021    Past Medical History:  Diagnosis Date   Cataract, nuclear sclerotic, right eye 11/20/2020   CLAUDICATION 05/14/2007   CORONARY ARTERY DISEASE 12/28/2006   Myoview 8/21: EF 73, normal perfusion, low risk    Cystoid macular edema of left eye 03/20/2021   Diabetes mellitus type II, controlled (HCC) 12/28/2006   Actos 30mg , insulin 70/30 20 units BID, amaryl 1mg  previously a1c <8. Worsened due to cold over 5 weeks around 05/2014 eating poorly and eating sugary syrups.   Stomach irritation on metformin.  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   Vitrectomy 11/21/2021    Past Surgical History:  Procedure Laterality Date   ABDOMINAL AORTOGRAM W/LOWER EXTREMITY N/A 02/10/2020   Procedure: ABDOMINAL AORTOGRAM W/LOWER EXTREMITY;  Surgeon: Sherren Kerns, MD;  Location: [facility];  Service: Cardiovascular;  Laterality: N/A;   CATARACT EXTRACTION Left 06/21/2020   CATARACT  EXTRACTION Right    COLONOSCOPY     CORONARY ARTERY BYPASS GRAFT     ENDARTERECTOMY FEMORAL Left 02/27/2020   Procedure: LEFT COMMON FEMORAL ENDARTERECTOMY;  Surgeon: Sherren Kerns, MD;  Location: [facility] OR;  Service: Vascular;  Laterality: Left;   FEMORAL-POPLITEAL BYPASS GRAFT Left 02/27/2020   Procedure: LEFT FEMORAL-ABOVE KNEE POPLITEAL BYPASS WITH NON REVERSED GREATER SAPHENOUS VEIN;  Surgeon: Sherren Kerns, MD;  Location: [facility] OR;  Service: Vascular;  Laterality: Left;   FEMORAL-POPLITEAL BYPASS GRAFT Right 04/23/2020   Procedure: BYPASS GRAFT FEMORAL- Above Knee POPLITEAL ARTERY RIGHT Using Right Greater Saphenous Vein;  Surgeon: Sherren Kerns, MD;  Location: [facility] OR;  Service: Vascular;  Laterality: Right;   KNEE ARTHROSCOPY  2012   UPPER GASTROINTESTINAL ENDOSCOPY       Current Outpatient Medications:    aspirin EC 81 MG tablet, Take 81 mg by mouth daily. Swallow whole., Disp: , Rfl:    atorvastatin (LIPITOR) 40 MG tablet, Take 1 tablet by mouth once daily, Disp: 90 tablet, Rfl: 0   Azelastine HCl 137 MCG/SPRAY SOLN, USE 1 SPRAY(S) IN EACH NOSTRIL TWICE DAILY AS DIRECTED, Disp: 30 mL, Rfl: 0   chlorthalidone (HYGROTON) 25 MG tablet, Take 1 tablet by mouth once daily, Disp: 90 tablet, Rfl: 0   cholecalciferol (VITAMIN D3) 25 MCG (1000 UNIT) tablet, Take 1,000 Units by mouth daily., Disp: , Rfl:    colchicine 0.6  MG tablet, Take 1 tablet by mouth once daily, Disp: 30 tablet, Rfl: 0   Continuous Blood Gluc Receiver (FREESTYLE LIBRE 14 DAY READER) DEVI, 2 each by Does not apply route daily., Disp: 2 each, Rfl: 11   Continuous Blood Gluc Sensor (FREESTYLE LIBRE 14 DAY SENSOR) MISC, 2 each by Does not apply route daily., Disp: 2 each, Rfl: 11   cyclobenzaprine (FLEXERIL) 5 MG tablet, TAKE ONE TABLET BY MOUTH TWICE DAILY AS NEEDED FOR MUSCLE SPASM, Disp: 30 tablet, Rfl: 0   fexofenadine (ALLEGRA) 180 MG tablet, Take 180 mg by mouth daily., Disp: , Rfl:    fluticasone (FLONASE) 50  MCG/ACT nasal spray, USE 2 SPRAY(S) IN EACH NOSTRIL ONCE DAILY AS NEEDED, Disp: 16 g, Rfl: 0   lisinopril (ZESTRIL) 5 MG tablet, Take 1 tablet (5 mg total) by mouth daily., Disp: 90 tablet, Rfl: 3   metoprolol succinate (TOPROL-XL) 100 MG 24 hr tablet, TAKE 1 TABLET EVERY DAY WITH OR IMMMEDIATELY FOLLOWING A MEAL, Disp: 90 tablet, Rfl: 3   NIFEdipine (PROCARDIA-XL) 30 MG (OSM) 24 hr tablet, Take 30 mg by mouth 2 (two) times daily., Disp: , Rfl:    NOVOLIN 70/30 RELION (70-30) 100 UNIT/ML injection, INJECT 22 UNITS INTO THE SKIN IN THE MORNING AND 20 UNITS IN THE EVENING, Disp: 10 mL, Rfl: 0   oxymetazoline (AFRIN) 0.05 % nasal spray, Place 1 spray into both nostrils 2 (two) times daily as needed for congestion., Disp: , Rfl:    pioglitazone (ACTOS) 30 MG tablet, Take 1 tablet by mouth once daily, Disp: 90 tablet, Rfl: 0   sulfamethoxazole-trimethoprim (BACTRIM DS) 800-160 MG tablet, Take 1 tablet by mouth 2 (two) times daily., Disp: 14 tablet, Rfl: 0  Allergies  Allergen Reactions   Penicillins Hives   Probenecid     Developed lump on chest- went away when he stopped the medicine   Tetracycline Hcl Hives   Allopurinol Itching   ROS neg/noncontributory except as noted HPI/below      Objective:     BP (!) 140/62 (BP Location: Left Arm, Patient Position: Sitting, Cuff Size: Normal)   Pulse (!) 55   Temp 98.2 F (36.8 C) (Temporal)   Resp 18   Ht 6' (1.829 m)   Wt 202 lb 4 oz (91.7 kg)   SpO2 99%   BMI 27.43 kg/m  Wt Readings from Last 3 Encounters:  10/31/22 202 lb 4 oz (91.7 kg)  10/29/22 205 lb 6.4 oz (93.2 kg)  09/19/22 206 lb (93.4 kg)    Physical Exam   Gen: WDWN NAD HEENT: NCAT, conjunctiva not injected, sclera nonicteric CARDIAC: brady RRR, S1S2+, no murmur. Has zio on MSK: no gross abnormalities.  NEURO: A&O x3.  CN II-XII intact.  PSYCH: normal mood. Good eye contact     Assessment & Plan:  Essential hypertension   HYPERTENSION-chronic.  Well controlled.  No  explanation for the high reading at [facility].  Patient monitor is reading high and it is over 37 years old.  Can get new monitor or not.  Keep regular follow up w/Card/PCP.  If symptoms, elevates, let us know  Angelena Sole, MD

## 2022-11-03 ENCOUNTER — Encounter: Payer: Self-pay | Admitting: Family Medicine

## 2022-11-07 NOTE — Telephone Encounter (Signed)
Called and spoke with Mya at [employer] and she states your note from 09/19/22 needs to be addended stating that "pt uses CGM freestyle libre", she couldn't take me giving it to her verbally. Once addended I will fax back to the company along with the freestyle [place] [facility].

## 2022-11-07 NOTE — Telephone Encounter (Signed)
Matthew [name] with [employer] states she sent another fax with an addendum. Also needing documentation stating continuous glucose monitoring. Please call if any questions, [phone]

## 2022-11-11 ENCOUNTER — Other Ambulatory Visit: Payer: Self-pay

## 2022-11-11 MED ORDER — FREESTYLE LIBRE 14 DAY SENSOR MISC: 2.0000 | Freq: Every day | 11 refills | Status: AC

## 2022-11-11 MED ORDER — FREESTYLE LIBRE 14 DAY READER DEVI: 2.0000 | Freq: Every day | 11 refills | Status: AC

## 2022-11-11 NOTE — Telephone Encounter (Signed)
Addended note has been faxed back to company.

## 2022-11-26 DIAGNOSIS — H35341 Macular cyst, hole, or pseudohole, right eye: Secondary | ICD-10-CM | POA: Diagnosis not present

## 2022-11-26 DIAGNOSIS — E113291 Type 2 diabetes mellitus with mild nonproliferative diabetic retinopathy without macular edema, right eye: Secondary | ICD-10-CM | POA: Diagnosis not present

## 2022-11-26 DIAGNOSIS — E113212 Type 2 diabetes mellitus with mild nonproliferative diabetic retinopathy with macular edema, left eye: Secondary | ICD-10-CM | POA: Diagnosis not present

## 2022-11-26 DIAGNOSIS — H34832 Tributary (branch) retinal vein occlusion, left eye, with macular edema: Secondary | ICD-10-CM | POA: Diagnosis not present

## 2022-12-04 ENCOUNTER — Other Ambulatory Visit: Payer: Self-pay | Admitting: *Deleted

## 2022-12-04 DIAGNOSIS — I739 Peripheral vascular disease, unspecified: Secondary | ICD-10-CM

## 2022-12-10 ENCOUNTER — Ambulatory Visit (INDEPENDENT_AMBULATORY_CARE_PROVIDER_SITE_OTHER)
Admission: RE | Admit: 2022-12-10 | Discharge: 2022-12-10 | Disposition: A | Discharge status: Home / Self Care | Payer: Medicare Other | Source: Ambulatory Visit | Attending: Vascular Surgery | Admitting: Vascular Surgery

## 2022-12-10 ENCOUNTER — Ambulatory Visit (INDEPENDENT_AMBULATORY_CARE_PROVIDER_SITE_OTHER): Payer: Medicare Other | Admitting: Physician Assistant

## 2022-12-10 ENCOUNTER — Ambulatory Visit (HOSPITAL_COMMUNITY)
Admission: RE | Admit: 2022-12-10 | Discharge: 2022-12-10 | Disposition: A | Discharge status: Home / Self Care | Payer: Medicare Other | Source: Ambulatory Visit | Attending: Vascular Surgery | Admitting: Vascular Surgery

## 2022-12-10 VITALS — BP 164/65 | HR 52 | Temp 97.9°F | Resp 16 | Ht 72.0 in | Wt 205.0 lb

## 2022-12-10 DIAGNOSIS — I739 Peripheral vascular disease, unspecified: Secondary | ICD-10-CM | POA: Insufficient documentation

## 2022-12-10 DIAGNOSIS — I70213 Atherosclerosis of native arteries of extremities with intermittent claudication, bilateral legs: Secondary | ICD-10-CM

## 2022-12-10 NOTE — Progress Notes (Signed)
Office Note     CC:  follow up Requesting Provider:  Shelva Majestic, MD  HPI: Matthew [name] is a 82 y.o. ([birth year]) male who presents for routine follow up of peripheral artery disease. He has history of right femoral to above knee popliteal bypass by Dr. Darrick Penna in October of 2021. He also has previously undergone left femoral endarterectomy and left femoral to above knee popliteal artery bypass by Dr. Darrick Penna in August of 2021. Since his surgeries he has had non disabling claudication. No rest pain or tissue loss.   Today he reports no change in his symptoms on ambulation. He can still walk approximately 2-3 blocks before he has to stop and rest. He denies any pain at rest. No non healing wounds. 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 He says this makes it challenging for him to tolerate walking very far. He is medically managed on Aspirin and statin.   Past Medical History:  Diagnosis Date   Cataract, nuclear sclerotic, right eye 11/20/2020   CLAUDICATION 05/14/2007   CORONARY ARTERY DISEASE 12/28/2006   Myoview 8/21: EF 73, normal perfusion, low risk    Cystoid macular edema of left eye 03/20/2021   Diabetes mellitus type II, controlled (HCC) 12/28/2006   Actos 30mg , insulin 70/30 20 units BID, amaryl 1mg  previously a1c <8. Worsened due to cold over 5 weeks around 05/2014 eating poorly and eating sugary syrups.   Stomach irritation on metformin.  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   Vitrectomy 11/21/2021    Past Surgical History:  Procedure Laterality Date   ABDOMINAL AORTOGRAM W/LOWER EXTREMITY N/A 02/10/2020   Procedure: ABDOMINAL AORTOGRAM W/LOWER EXTREMITY;  Surgeon: Sherren Kerns, MD;  Location: [facility];  Service: Cardiovascular;  Laterality: N/A;   CATARACT EXTRACTION Left 06/21/2020   CATARACT EXTRACTION Right    COLONOSCOPY     CORONARY ARTERY BYPASS GRAFT     ENDARTERECTOMY FEMORAL Left 02/27/2020   Procedure: LEFT COMMON FEMORAL ENDARTERECTOMY;  Surgeon: Sherren Kerns, MD;  Location: [facility] OR;  Service: Vascular;  Laterality: Left;   FEMORAL-POPLITEAL BYPASS GRAFT Left 02/27/2020   Procedure: LEFT FEMORAL-ABOVE KNEE POPLITEAL BYPASS WITH NON REVERSED GREATER SAPHENOUS VEIN;  Surgeon: Sherren Kerns, MD;  Location: [facility] OR;  Service: Vascular;  Laterality: Left;   FEMORAL-POPLITEAL BYPASS GRAFT Right 04/23/2020   Procedure: BYPASS GRAFT FEMORAL- Above Knee POPLITEAL ARTERY RIGHT Using Right Greater Saphenous Vein;  Surgeon: Sherren Kerns, MD;  Location: [facility] OR;  Service: Vascular;  Laterality: Right;   KNEE ARTHROSCOPY  2012   UPPER GASTROINTESTINAL ENDOSCOPY      Social History   Socioeconomic History   Marital status: Married    Spouse name: Not on file   Number of children: 2   Years of education: Not on  file   Highest education level: Not on file  Occupational History   Occupation: retired  Tobacco Use   Smoking status: Former    Types: Cigarettes    Quit date: 07/09/1968    Years since quitting: 54.4    Passive exposure: Never   Smokeless tobacco: Former    Types: Chew    Quit date: 07/09/1988   Tobacco comments:    minimal use x 10 years   Vaping Use   Vaping Use: Never used  Substance and Sexual Activity   Alcohol use: No    Comment: last was 6 months   Drug use: No   Sexual activity: Not on file  Other Topics Concern   Not on  file  Social History Narrative   Married 1983. 2 sons. No grandkids yet.       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       Hobbies: volunteer work, watch sports   Social Determinants of Health   Financial Resource Strain: Not on file  Food Insecurity: No Food Insecurity (08/20/2022)   Hunger Vital Sign    Worried About Running Out of Food in the Last Year: Never true    Ran Out of Food in the Last Year: Never true  Transportation Needs: No Transportation Needs (08/20/2022)   PRAPARE - Administrator, Civil Service (Medical): No    Lack of Transportation (Non-Medical): No  Physical Activity: Not on file  Stress: Not on file  Social Connections: Not on file  Intimate Partner Violence: Not on file    Family History  Problem Relation Age of Onset   Diabetes Mother    Hypertension Mother    Stroke Father    Colon cancer Neg Hx    Esophageal cancer Neg Hx    Rectal cancer Neg Hx    Stomach cancer Neg Hx     Current Outpatient Medications  Medication Sig Dispense Refill   aspirin EC 81 MG tablet Take 81 mg by mouth daily. Swallow whole.     atorvastatin (LIPITOR) 40 MG tablet Take 1 tablet by mouth once daily 90 tablet 0   Azelastine HCl 137 MCG/SPRAY SOLN USE 1 SPRAY(S) IN EACH NOSTRIL TWICE DAILY AS DIRECTED 30 mL 0   chlorthalidone (HYGROTON) 25 MG tablet Take 1 tablet by mouth once daily 90 tablet 0   cholecalciferol (VITAMIN D3) 25 MCG (1000 UNIT) tablet Take 1,000 Units by mouth daily.     colchicine 0.6 MG tablet Take 1 tablet by mouth once daily 30 tablet 0   Continuous Glucose Receiver (FREESTYLE LIBRE 14 DAY READER) DEVI 2 each by Does not apply route daily. 2 each 11   Continuous Glucose Sensor (FREESTYLE LIBRE 14 DAY SENSOR) MISC 2 each by Does not apply route daily. 2 each 11   cyclobenzaprine (FLEXERIL) 5 MG tablet TAKE ONE TABLET BY MOUTH TWICE DAILY AS NEEDED FOR MUSCLE SPASM 30 tablet  0   fexofenadine (ALLEGRA) 180 MG tablet Take 180 mg by mouth daily.     fluticasone (FLONASE) 50 MCG/ACT nasal spray USE 2 SPRAY(S) IN EACH NOSTRIL ONCE DAILY AS NEEDED 16 g 0   lisinopril (ZESTRIL) 5 MG tablet Take 1 tablet (5 mg total) by mouth daily. 90 tablet 3   metoprolol succinate (TOPROL-XL) 100 MG 24 hr tablet TAKE 1 TABLET EVERY DAY WITH OR IMMMEDIATELY FOLLOWING A MEAL 90 tablet 3   NIFEdipine (PROCARDIA-XL) 30 MG (OSM)  24 hr tablet Take 30 mg by mouth 2 (two) times daily.     NOVOLIN 70/30 RELION (70-30) 100 UNIT/ML injection INJECT 22 UNITS INTO THE SKIN IN THE MORNING AND 20 UNITS IN THE EVENING 10 mL 0   oxymetazoline (AFRIN) 0.05 % nasal spray Place 1 spray into both nostrils 2 (two) times daily as needed for congestion.     pioglitazone (ACTOS) 30 MG tablet Take 1 tablet by mouth once daily 90 tablet 0   sulfamethoxazole-trimethoprim (BACTRIM DS) 800-160 MG tablet Take 1 tablet by mouth 2 (two) times daily. 14 tablet 0   No current facility-administered medications for this visit.    Allergies  Allergen Reactions   Penicillins Hives   Probenecid     Developed lump on chest- went away when he stopped the medicine   Tetracycline Hcl Hives   Allopurinol Itching     REVIEW OF SYSTEMS:   [X]  denotes positive finding, [ ]  denotes negative finding Cardiac  Comments:  Chest pain or chest pressure:    Shortness of breath upon exertion:    Short of breath when lying flat:    Irregular heart rhythm:        Vascular    Pain in calf, thigh, or hip brought on by ambulation:    Pain in feet at night that wakes you up from your sleep:     Blood clot in your veins:    Leg swelling:         Pulmonary    Oxygen at home:    Productive cough:     Wheezing:         Neurologic    Sudden weakness in arms or legs:     Sudden numbness in arms or legs:     Sudden onset of difficulty speaking or slurred speech:    Temporary loss of vision in one eye:     Problems with  dizziness:         Gastrointestinal    Blood in stool:     Vomited blood:         Genitourinary    Burning when urinating:     Blood in urine:        Psychiatric    Major depression:         Hematologic    Bleeding problems:    Problems with blood clotting too easily:        Skin    Rashes or ulcers:        Constitutional    Fever or chills:      PHYSICAL EXAMINATION:  Vitals:   12/10/22 1038  BP: (!) 164/65  Pulse: (!) 52  Resp: 16  Temp: 97.9 F (36.6 C)  TempSrc: Temporal  SpO2: 99%  Weight: 205 lb (93 kg)  Height: 6' (1.829 m)    General:  WDWN in NAD; vital signs documented above Gait: normal HENT: WNL, normocephalic Pulmonary: normal non-labored breathing , without  wheezing Cardiac: regular HR Abdomen: soft Vascular Exam/Pulses: 2+ radial, 2+ femoral, 2+ 2+ DP pulses bilaterally Extremities: without ischemic changes, without Gangrene , without cellulitis; without open wounds;  Musculoskeletal: no muscle wasting or atrophy  Neurologic: A&O X 3 Psychiatric:  The pt has normal affect.   Non-Invasive Vascular Imaging:   +-------+-----------+-----------+------------+------------+  ABI/TBIToday's ABIToday's TBIPrevious ABIPrevious TBI  +-------+-----------+-----------+------------+------------+  Right 0.96       0.61       0.99        0.75          +-------+-----------+-----------+------------+------------+  Left  1.01       0.87       1.01        0.69          +-------+-----------+-----------+------------+------------+   VAS [place] Lower extremity bypass graft duplex: Right Graft #1: Fem-pop  +------------------+--------+---------------+--------+--------+                   PSV cm/sStenosis       WaveformComments  +------------------+--------+---------------+--------+--------+  Inflow           123                    biphasic          +------------------+--------+---------------+--------+--------+  Prox Anastomosis  193                     biphasic          +------------------+--------+---------------+--------+--------+  Proximal Graft    109                    biphasic          +------------------+--------+---------------+--------+--------+  Mid Graft         241     50-70% stenosisbiphasicvalve     +------------------+--------+---------------+--------+--------+  Distal Graft      121                    biphasic          +------------------+--------+---------------+--------+--------+  Distal Anastomosis118                    biphasic          +------------------+--------+---------------+--------+--------+  Outflow          78                     biphasic          +------------------+--------+---------------+--------+--------+   Left Graft #1: fem-pop  +--------------------+--------+---------------+--------+--------+                     PSV cm/sStenosis       WaveformComments  +--------------------+--------+---------------+--------+--------+  Inflow             153                    biphasic          +--------------------+--------+---------------+--------+--------+  Proximal Anastomosis125                    biphasic          +--------------------+--------+---------------+--------+--------+  Proximal Graft      161                    biphasic          +--------------------+--------+---------------+--------+--------+  Mid Graft           211     50-70% stenosisbiphasic          +--------------------+--------+---------------+--------+--------+  Distal Graft        166                    biphasic          +--------------------+--------+---------------+--------+--------+  Distal Anastomosis  221     50-70% stenosisbiphasic          +--------------------+--------+---------------+--------+--------+  Outflow            87  biphasic          +--------------------+--------+---------------+--------+--------+     Summary:  Right: Patent bypass graft with elevated velocity observed in the mid segment without presence of disease. This appears to be a dilitation of the vein where a valve would have been pre-operatively.    Left: Patent graft with elevated velocity observed in the mid segment with out presence of disease. Increase of velocity at area of distal anastomosis with no visualized plaque.     ASSESSMENT/PLAN:: 82 y.o. male here for follow up for peripheral artery disease. He has history of right femoral to above knee popliteal bypass by Dr. Darrick Penna in October of 2021. He also has previously undergone left femoral endarterectomy and left femoral to above knee popliteal artery bypass by Dr. Darrick Penna in August of 2021. He continues to have non disabling claudication in both legs. He has no rest pain or tissue loss. 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  - ABI and bypass graft duplex is essentially unchanged from last year -Continue Aspirin and statin - Encourage walking regimen - He will follow up  in 1 year with repeat BLE bypass graft duplex studies and ABI   Graceann Congress, PA-C [facility] [phone]  Clinic MD:   Randie Heinz

## 2022-12-11 LAB — VAS US ABI WITH/WO TBI
Left ABI: 1.01
Right ABI: 0.96

## 2022-12-22 ENCOUNTER — Other Ambulatory Visit: Payer: Self-pay

## 2022-12-22 DIAGNOSIS — I739 Peripheral vascular disease, unspecified: Secondary | ICD-10-CM

## 2023-01-22 ENCOUNTER — Ambulatory Visit (INDEPENDENT_AMBULATORY_CARE_PROVIDER_SITE_OTHER): Payer: Medicare Other | Admitting: Family Medicine

## 2023-01-22 ENCOUNTER — Encounter: Payer: Self-pay | Admitting: Family Medicine

## 2023-01-22 VITALS — BP 128/62 | HR 72 | Temp 98.2°F | Ht 72.0 in | Wt 207.0 lb

## 2023-01-22 DIAGNOSIS — M79645 Pain in left finger(s): Secondary | ICD-10-CM

## 2023-01-22 DIAGNOSIS — N183 Chronic kidney disease, stage 3 unspecified: Secondary | ICD-10-CM

## 2023-01-22 DIAGNOSIS — E11319 Type 2 diabetes mellitus with unspecified diabetic retinopathy without macular edema: Secondary | ICD-10-CM | POA: Diagnosis not present

## 2023-01-22 DIAGNOSIS — E1169 Type 2 diabetes mellitus with other specified complication: Secondary | ICD-10-CM | POA: Diagnosis not present

## 2023-01-22 DIAGNOSIS — M79644 Pain in right finger(s): Secondary | ICD-10-CM | POA: Diagnosis not present

## 2023-01-22 DIAGNOSIS — I10 Essential (primary) hypertension: Secondary | ICD-10-CM

## 2023-01-22 DIAGNOSIS — Z125 Encounter for screening for malignant neoplasm of prostate: Secondary | ICD-10-CM

## 2023-01-22 DIAGNOSIS — Z794 Long term (current) use of insulin: Secondary | ICD-10-CM

## 2023-01-22 DIAGNOSIS — E785 Hyperlipidemia, unspecified: Secondary | ICD-10-CM | POA: Diagnosis not present

## 2023-01-22 LAB — CBC WITH DIFFERENTIAL/PLATELET
Basophils Absolute: 0 10*3/uL (ref 0.0–0.1)
Basophils Relative: 0.5 % (ref 0.0–3.0)
Eosinophils Absolute: 0.3 10*3/uL (ref 0.0–0.7)
Eosinophils Relative: 7 % — ABNORMAL HIGH (ref 0.0–5.0)
HCT: 42.3 % (ref 39.0–52.0)
Hemoglobin: 13.4 g/dL (ref 13.0–17.0)
Lymphocytes Relative: 19.3 % (ref 12.0–46.0)
Lymphs Abs: 0.8 10*3/uL (ref 0.7–4.0)
MCHC: 31.6 g/dL (ref 30.0–36.0)
MCV: 89.8 fl (ref 78.0–100.0)
Monocytes Absolute: 0.3 10*3/uL (ref 0.1–1.0)
Monocytes Relative: 7.2 % (ref 3.0–12.0)
Neutro Abs: 2.8 10*3/uL (ref 1.4–7.7)
Neutrophils Relative %: 66 % (ref 43.0–77.0)
Platelets: 158 10*3/uL (ref 150.0–400.0)
RBC: 4.71 Mil/uL (ref 4.22–5.81)
RDW: 14.4 % (ref 11.5–15.5)
WBC: 4.3 10*3/uL (ref 4.0–10.5)

## 2023-01-22 LAB — COMPREHENSIVE METABOLIC PANEL
ALT: 13 U/L (ref 0–53)
AST: 18 U/L (ref 0–37)
Albumin: 4 g/dL (ref 3.5–5.2)
Alkaline Phosphatase: 58 U/L (ref 39–117)
BUN: 40 mg/dL — ABNORMAL HIGH (ref 6–23)
CO2: 30 mEq/L (ref 19–32)
Calcium: 9.6 mg/dL (ref 8.4–10.5)
Chloride: 105 mEq/L (ref 96–112)
Creatinine, Ser: 1.78 mg/dL — ABNORMAL HIGH (ref 0.40–1.50)
GFR: 35.61 mL/min — ABNORMAL LOW (ref >60.00)
Glucose, Bld: 94 mg/dL (ref 70–99)
Potassium: 4.9 mEq/L (ref 3.5–5.1)
Sodium: 139 mEq/L (ref 135–145)
Total Bilirubin: 0.4 mg/dL (ref 0.2–1.2)
Total Protein: 7.6 g/dL (ref 6.0–8.3)

## 2023-01-22 LAB — PSA, MEDICARE: PSA: 0.96 ng/ml (ref 0.10–4.00)

## 2023-01-22 LAB — HEMOGLOBIN A1C: Hgb A1c MFr Bld: 7.4 % — ABNORMAL HIGH (ref 4.6–6.5)

## 2023-01-22 MED ORDER — LISINOPRIL 10 MG PO TABS
10.0000 mg | ORAL_TABLET | Freq: Every day | ORAL | 3 refills | Status: DC
Start: 2023-01-22 — End: 2023-12-03

## 2023-01-22 NOTE — Progress Notes (Addendum)
Phone [phone] In person visit   Subjective:   Matthew [name] is a 82 y.o. year old very pleasant male patient who presents for/with See problem oriented charting Chief Complaint  Patient presents with   Medical Management of Chronic Issues   Diabetes   Hypertension    Pt states bps have been crazy running and he has seen Dr. Ruthine Dose for this.   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) 12/28/2006    Priority: High    Coronary artery disease s/p CABG 1997 12/28/2006    Priority: High   Diabetes mellitus type II, controlled (HCC) 12/28/2006    Priority: High   Aortic atherosclerosis (HCC) 01/31/2022    Priority: Medium    Cervical arthritis (HCC) 10/25/2014    Priority: Medium    Erectile dysfunction 08/13/2010    Priority: Medium    CKD (chronic kidney disease), stage III (HCC) 09/15/2008    Priority: Medium    Hyperlipidemia associated with type 2 diabetes mellitus (HCC) 12/28/2006    Priority: Medium    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    Priority: Medium    Essential hypertension 12/28/2006    Priority: Medium    Allergic rhinitis 11/18/2016    Priority: Low   Pulmonary nodule seen on imaging study 05/17/2012    Priority: Low   Both eyes affected by mild nonproliferative diabetic retinopathy with macular edema, associated with type 2 diabetes mellitus (HCC) 09/19/2022   Full thickness macular hole, right 11/13/2021   Cystoid macular edema of left eye 03/20/2021   Type 2 macular telangiectasis, 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  Swallow whole.     atorvastatin (LIPITOR) 40 MG tablet Take 1 tablet by mouth once daily 90 tablet 0   Azelastine HCl 137 MCG/SPRAY SOLN USE 1 SPRAY(S) IN EACH NOSTRIL TWICE DAILY AS DIRECTED 30 mL 0   chlorthalidone (HYGROTON) 25 MG tablet Take 1 tablet by mouth once daily 90 tablet 0   cholecalciferol (VITAMIN D3) 25 MCG (1000 UNIT) tablet Take 1,000 Units by mouth daily.     colchicine 0.6 MG tablet Take 1 tablet by mouth once daily 30 tablet 0   Continuous Glucose Receiver (FREESTYLE LIBRE 14 DAY READER) DEVI 2 each by Does not apply route daily. 2 each 11   Continuous Glucose Sensor (FREESTYLE LIBRE 14 DAY SENSOR) MISC 2 each by Does not apply route daily. 2 each 11   fexofenadine (ALLEGRA) 180 MG tablet Take 180 mg by mouth daily.     fluticasone (FLONASE) 50 MCG/ACT nasal spray USE 2 SPRAY(S) IN EACH NOSTRIL ONCE DAILY AS NEEDED 16 g 0   lisinopril (ZESTRIL) 10 MG tablet Take 1 tablet (10 mg total) by mouth daily. 90 tablet 3   metoprolol succinate (TOPROL-XL) 100 MG 24 hr tablet TAKE 1 TABLET EVERY DAY WITH OR IMMMEDIATELY FOLLOWING A MEAL 90 tablet 3   NIFEdipine (PROCARDIA-XL) 30 MG (OSM) 24 hr tablet Take 30 mg by mouth 2 (two) times daily.     NOVOLIN 70/30 RELION (70-30) 100 UNIT/ML injection INJECT  22 UNITS INTO THE SKIN IN THE MORNING AND 20 UNITS IN THE EVENING 10 mL 0   oxymetazoline (AFRIN) 0.05 % nasal spray Place 1 spray into both nostrils 2 (two) times daily as needed for congestion.     pioglitazone (ACTOS) 30 MG tablet Take 1 tablet by mouth once daily 90 tablet 0   cyclobenzaprine (FLEXERIL) 5 MG tablet TAKE ONE TABLET BY MOUTH TWICE DAILY AS NEEDED FOR MUSCLE SPASM (Patient not taking: Reported on 01/22/2023) 30 tablet 0   No current facility-administered medications for this visit.     Objective:  BP 128/62   Pulse 72   Temp 98.2 F (36.8 C)   Ht 6' (1.829 m)   Wt 207 lb (93.9 kg)   SpO2 99%   BMI 28.07 kg/m  Gen: NAD, resting comfortably CV: RRR no murmurs  rubs or gallops Lungs: CTAB no crackles, wheeze, rhonchi Abdomen: soft/nontender/nondistended/normal bowel sounds. No rebound or guarding.  Ext: trace edema under compressoin Skin: warm, dry    Assessment and Plan   # PAD-follows with vascular surgery- with right femoral to above knee popliteal bypass Dr. Darrick Penna October 2021 and left femoral endarteretomy and left femoral to above knee popliteal artery bypass in August 2021- has seen vascular in last few months # CAD-follows with cardiology Dr. Excell Seltzer #hyperlipidemia S: Medication:Aspirin 81 mg, atorvastatin 40 mg Symptoms:no chest pain or shortness of breath   Lab Results  Component Value Date   CHOL 116 09/19/2022   HDL 42.90 09/19/2022   LDLCALC 59 09/19/2022   LDLDIRECT 69.0 03/11/2021   TRIG 74.0 09/19/2022   CHOLHDL 3 09/19/2022  A/P: CAD-asymptomatic continue current medications  PAD-asymptomatic continue current medications   Hyperlipidemia-at goal on last check- continue current medications    # Diabetes-tolerate A1c up to 8.5 to avoid lows S: Medication:Actos 30 mg daily, Novolin 70/30 with  20 units in the morning and 18  in the evening. Has had a few lows if skips a meals- strongly encouraged to avoid llows -Prior glimepiride stopped due to low sugars (also had to reduce insulin level and for similar reasons) -GI side effects on metformin CBGs- uses freestyle libre.  -Patient requires use of CONTINUOUS GLUCOSE MONITOR to monitor blood sugars. He is actively using this continuously. History of lows at 09/19/22 and meter helpful Exercise and diet- hard to exercise with foot pain- encouraged biking or water walking but states biking caused swelling as well Lab Results  Component Value Date   HGBA1C 7.2 (H) 09/19/2022   HGBA1C 7.0 (A) 09/19/2022   HGBA1C 7.3 (H) 06/02/2022  A/P: hopefully stable or improved- update a1c today. Continue current meds for now but advised to never miss meals or snacks- really want to avoid  lows- having CONTINUOUS GLUCOSE MONITOR is helpful for this  #hypertension # CKD stage III-GFR typically low  30s- 41s - sees [facility] Dr. Ronalee Belts S: medication: Chlorthalidone 25 mg, Procardia 30 mg twice daily, metoprolol 100 mg extended release -Lisinopril stopped in the past by Dr. Darrick Penna for unclear reasons-we restarted 5 mg in 2023 Home readings #s: got a new machine as was having a lot of fluctuations despite no significant home changes. He reports has been high at home for last 3.5 months. #s often 150/s76-90. Has reduced salt and still has not helped BP Readings from Last 3 Encounters:  01/22/23 128/62  12/10/22 (!) 164/65  10/31/22 (!) 140/62  A/P: blood pressure looks great in the office bbut has been very high at  home- recommend bringing home cuff to next visit. Home readings are really worrying him so we opted to increase lisinopril to 10 mg as long as bloodwork is reassuring- I sent this in today but asked him to hold off on changing until we get results  #left 3rd distal interphalangeal\ joint became inflamed about a month ago- did finally com eback down but has some lingering induration just above distal interphalangeal\- wonder if he had an infection there that he had cleared - does have some pain with palpation and he would like to have hand surgery see him -also has some deviation of right 2nd finger- he wants to talk to hand surgeon about options for this  #prostate cancer screening- his last was in 2015- he requests to do 1 more PSA- discussed potential risks and fact this is outside of guidelines but he opts in anyway  Recommended follow up: Return in about 4 months (around 05/25/2023) for followup or sooner if needed.Schedule b4 you leave.  Lab/Order associations:   ICD-10-CM   1. Controlled type 2 diabetes mellitus with retinopathy, with long-term current use of insulin, macular edema presence unspecified, unspecified laterality, unspecified retinopathy severity  (HCC)  E11.319 Comprehensive metabolic panel   Z61.0 CBC with Differential/Platelet    Hemoglobin A1c    2. Essential hypertension  I10 Comprehensive metabolic panel    CBC with Differential/Platelet    3. Hyperlipidemia associated with type 2 diabetes mellitus (HCC)  E11.69 Comprehensive metabolic panel   R60.4 CBC with Differential/Platelet    4. Stage 3 chronic kidney disease, unspecified whether stage 3a or 3b CKD (HCC)  N18.30     5. Pain in finger of both hands  M79.645 Ambulatory referral to Hand Surgery   M79.644     6. Screening for prostate cancer  Z12.5 PSA, Medicare      Meds ordered this encounter  Medications   lisinopril (ZESTRIL) 10 MG tablet    Sig: Take 1 tablet (10 mg total) by mouth daily.    Dispense:  90 tablet    Refill:  3    Return precautions advised.  Tana Conch, MD

## 2023-01-22 NOTE — Patient Instructions (Addendum)
blood pressure looks great in the office but has been very high at home- recommend bringing home cuff to next visit. Home readings are really worrying him so we opted to increase lisinopril to 10 mg as long as bloodwork is reassuring- I sent this in today but asked him to hold off on changing until we get results  Please stop by lab before you go If you have mychart- we will send your results within 3 business days of [place] receiving them.  If you do not have mychart- we will call you about results within 5 business days of [place] receiving them.  *please also note that you will see labs on mychart as soon as they post. I will later go in and write notes on them- will say "notes from Dr. Durene Cal"   We have placed a referral for you today to hand surgery. In some cases you will see # listed below- you can call this if you have not heard within a week. If you do not see # listed- you should receive a mychart message or phone call within a week with the # to call. Reach out to [place] if you ar enot scheduled within 2 weeks   Recommended follow up: Return in about 4 months (around 05/25/2023) for followup or sooner if needed.Schedule b4 you leave.

## 2023-01-26 DIAGNOSIS — H35341 Macular cyst, hole, or pseudohole, right eye: Secondary | ICD-10-CM | POA: Diagnosis not present

## 2023-01-26 DIAGNOSIS — E113212 Type 2 diabetes mellitus with mild nonproliferative diabetic retinopathy with macular edema, left eye: Secondary | ICD-10-CM | POA: Diagnosis not present

## 2023-01-26 DIAGNOSIS — E113291 Type 2 diabetes mellitus with mild nonproliferative diabetic retinopathy without macular edema, right eye: Secondary | ICD-10-CM | POA: Diagnosis not present

## 2023-01-26 DIAGNOSIS — H35352 Cystoid macular degeneration, left eye: Secondary | ICD-10-CM | POA: Diagnosis not present

## 2023-01-26 DIAGNOSIS — H34832 Tributary (branch) retinal vein occlusion, left eye, with macular edema: Secondary | ICD-10-CM | POA: Diagnosis not present

## 2023-01-26 LAB — HM DIABETES EYE EXAM

## 2023-01-29 ENCOUNTER — Other Ambulatory Visit (INDEPENDENT_AMBULATORY_CARE_PROVIDER_SITE_OTHER): Payer: Medicare Other

## 2023-01-29 ENCOUNTER — Ambulatory Visit (INDEPENDENT_AMBULATORY_CARE_PROVIDER_SITE_OTHER): Payer: Medicare Other | Admitting: Orthopedic Surgery

## 2023-01-29 DIAGNOSIS — M79641 Pain in right hand: Secondary | ICD-10-CM

## 2023-01-29 DIAGNOSIS — M79642 Pain in left hand: Secondary | ICD-10-CM | POA: Diagnosis not present

## 2023-01-29 DIAGNOSIS — E1122 Type 2 diabetes mellitus with diabetic chronic kidney disease: Secondary | ICD-10-CM | POA: Diagnosis not present

## 2023-01-29 DIAGNOSIS — R609 Edema, unspecified: Secondary | ICD-10-CM | POA: Diagnosis not present

## 2023-01-29 DIAGNOSIS — M151 Heberden's nodes (with arthropathy): Secondary | ICD-10-CM | POA: Diagnosis not present

## 2023-01-29 DIAGNOSIS — E559 Vitamin D deficiency, unspecified: Secondary | ICD-10-CM | POA: Diagnosis not present

## 2023-01-29 DIAGNOSIS — N1831 Chronic kidney disease, stage 3a: Secondary | ICD-10-CM | POA: Diagnosis not present

## 2023-01-29 DIAGNOSIS — M1A9XX1 Chronic gout, unspecified, with tophus (tophi): Secondary | ICD-10-CM | POA: Diagnosis not present

## 2023-01-29 DIAGNOSIS — I129 Hypertensive chronic kidney disease with stage 1 through stage 4 chronic kidney disease, or unspecified chronic kidney disease: Secondary | ICD-10-CM | POA: Diagnosis not present

## 2023-01-29 NOTE — Progress Notes (Signed)
Matthew [name] - 82 y.o. male MRN [id number]  Date of birth: 10-Apr-1941  Office Visit Note: Visit Date: 01/29/2023 PCP: Shelva Majestic, MD Referred by: Shelva Majestic, MD  Subjective: Chief Complaint  Patient presents with   Right Hand - Pain   Left Hand - Pain   HPI: Matthew [name] is a pleasant 82 y.o. male who presents today for evaluation of ongoing right index finger pain and deformity.   He has not undergone any prior treatments to the right index finger.  He does describe an old crush injury to the index finger which did not undergo formalized treatment.    The left long finger has occasional swelling, which has resolved recently.  He does have a history of gout and has had prior flareups in the PIP of this digit.  He is currently on colchicine per his PCP.  Visit Reason:Right Index/ Left Middle Hand dominance: right Occupation: None Diabetic: Yes / 7.4  Prior Testing: none  Injections: none Treatments: none Prior Surgery: none  *crush injury to right index/ no injury to left*   Pertinent ROS were reviewed with the patient and found to be negative unless otherwise specified above in HPI.   Assessment & Plan: Visit Diagnoses:  1. 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 Pain in left hand   3. Pain in right hand   4. Gout with tophi     Plan: Extensive discussion was had with patient today regarding his right index finger.  He does have significant DIP joint arthritis with notable ulnar deviation at this joint.  This may be posttraumatic in nature from his prior injury.  At this juncture, his pain is relatively well-controlled, we discussed conservative measures in the form of Coban wrapping and topical Voltaren.  Should his pain become more severe, he would be reasonable candidate for discussion regarding DIP fusion in the future.  We discussed DIP fusion, risk and benefits as well as the postoperative  protocol.  As for the left long finger, this does appear to be related to his prior gout flare.  I am glad to see that he is being medically managed for this and the swelling has improved drastically.  We discussed the etiology of gout as well as reasons for potential flare.  He should continue with medical management for the time being.  Follow-up: No follow-ups on file.   Meds & Orders: No orders of the defined types were placed in this encounter.   Orders Placed This Encounter  Procedures   XR Hand Complete Right   XR Hand Complete Left     Procedures: No procedures performed      Clinical History: No specialty comments available.  He reports that he quit smoking about 54 years ago. His smoking use included cigarettes. He has never been exposed to tobacco smoke. He quit smokeless tobacco use about 34 years ago.  His smokeless tobacco use included chew.  Recent Labs    09/19/22 1058 09/19/22 1126 01/22/23 1101  HGBA1C 7.0* 7.2* 7.4*    Objective:   Vital Signs: There were no vitals taken for this visit.  Physical Exam  Gen: Well-appearing, in no acute distress; non-toxic CV: Regular Rate. Well-perfused. Warm.  Resp: Breathing unlabored on room air; no wheezing. Psych: Fluid speech in conversation; appropriate affect; normal thought process Neuro: Sensation intact throughout. No gross coordination deficits.   Ortho Exam Right index finger - notable  ulnar deviation at the DIP, mild pain with deep palpation - Range of motion of DIP 0-15  Left long finger - Mild diffuse swelling - Range of motion well-preserved, able to perform composite fist with left hand without significant restriction, mild pain throughout  Imaging: XR Hand Complete Left  Result Date: 01/29/2023 3 views of the left hand were completed No significant degenerative changes, bone mineralization appears appropriate.  XR Hand Complete Right  Result Date: 01/29/2023 3 views of the right hand were  completed today There is significant arthritic change with notable ulnar deviation of the right index finger DIP joint, joint space obliteration with significant osteophyte formation.  Remaining small joints are well-preserved, no other significant bony abnormalities appreciated.   Past Medical/Family/Surgical/Social History: Medications & Allergies reviewed per EMR, new medications updated. 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   Allergic rhinitis 11/18/2016   Cervical arthritis (HCC) 10/25/2014   Pulmonary nodule seen on imaging study 05/17/2012   Erectile dysfunction 08/13/2010   CKD (chronic kidney disease), stage III (HCC) 09/15/2008   PAD (peripheral artery disease) (HCC)-with claudication 05/14/2007   Mild nonproliferative diabetic retinopathy of both eyes (HCC) 12/28/2006   Hyperlipidemia associated with type 2 diabetes mellitus (HCC) 12/28/2006   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   Essential hypertension 12/28/2006    Coronary artery disease s/p CABG 1997 12/28/2006   Diabetes mellitus type II, controlled (HCC) 12/28/2006   Past Medical History:  Diagnosis Date   Cataract, nuclear sclerotic, right eye 11/20/2020   CLAUDICATION 05/14/2007   CORONARY ARTERY DISEASE 12/28/2006   Myoview 8/21: EF 73, normal perfusion, low risk    Cystoid macular edema of left eye 03/20/2021   Diabetes mellitus type II, controlled (HCC) 12/28/2006   Actos 30mg , insulin 70/30 20 units BID, amaryl 1mg  previously a1c <8. Worsened due to cold over 5 weeks around 05/2014 eating poorly and  eating sugary syrups.   Stomach irritation on metformin.  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   Vitrectomy 11/21/2021   Family History  Problem Relation Age of Onset   Diabetes Mother    Hypertension Mother    Stroke Father    Colon cancer Neg Hx    Esophageal cancer Neg Hx    Rectal cancer Neg Hx    Stomach cancer Neg Hx    Past Surgical History:  Procedure Laterality Date   ABDOMINAL AORTOGRAM W/LOWER EXTREMITY N/A 02/10/2020   Procedure:  ABDOMINAL AORTOGRAM W/LOWER EXTREMITY;  Surgeon: Sherren Kerns, MD;  Location: [facility] INVASIVE CV LAB;  Service: Cardiovascular;  Laterality: N/A;   CATARACT EXTRACTION Left 06/21/2020   CATARACT EXTRACTION Right    COLONOSCOPY     CORONARY ARTERY BYPASS GRAFT     ENDARTERECTOMY FEMORAL Left 02/27/2020   Procedure: LEFT COMMON FEMORAL ENDARTERECTOMY;  Surgeon: Sherren Kerns, MD;  Location: [facility] OR;  Service: Vascular;  Laterality: Left;   FEMORAL-POPLITEAL BYPASS GRAFT Left 02/27/2020   Procedure: LEFT FEMORAL-ABOVE KNEE POPLITEAL BYPASS WITH NON REVERSED GREATER SAPHENOUS VEIN;  Surgeon: Sherren Kerns, MD;  Location: [facility] OR;  Service: Vascular;  Laterality: Left;   FEMORAL-POPLITEAL BYPASS GRAFT Right 04/23/2020   Procedure: BYPASS GRAFT FEMORAL- Above Knee POPLITEAL ARTERY RIGHT Using Right Greater Saphenous Vein;  Surgeon: Sherren Kerns, MD;  Location: [facility] OR;   Service: Vascular;  Laterality: Right;   KNEE ARTHROSCOPY  2012   UPPER GASTROINTESTINAL ENDOSCOPY     Social History   Occupational History   Occupation: retired  Tobacco Use   Smoking status: Former    Current packs/day: 0.00    Types: Cigarettes    Quit date: 07/09/1968    Years since quitting: 54.5    Passive exposure: Never   Smokeless tobacco: Former    Types: Chew    Quit date: 07/09/1988   Tobacco comments:    minimal use x 10 years   Vaping Use   Vaping status: Never Used  Substance and Sexual Activity   Alcohol use: No    Comment: last was 6 months   Drug use: No   Sexual activity: Not on file    Crescent Gotham Trevor Mace, M.D. [facility] 4:15 PM

## 2023-02-02 ENCOUNTER — Other Ambulatory Visit: Payer: Self-pay | Admitting: Family Medicine

## 2023-02-06 ENCOUNTER — Telehealth: Payer: Self-pay | Admitting: Family Medicine

## 2023-02-06 NOTE — Telephone Encounter (Signed)
Caller states they received OV notes for patient from 7/25 but needed an addendum with PCP's signature and date on it and faxed back to [phone].

## 2023-02-09 NOTE — Telephone Encounter (Signed)
Papers stamped and faxed back.

## 2023-02-18 ENCOUNTER — Encounter (HOSPITAL_COMMUNITY): Payer: Self-pay

## 2023-02-18 ENCOUNTER — Ambulatory Visit (HOSPITAL_COMMUNITY)
Admission: EM | Admit: 2023-02-18 | Discharge: 2023-02-18 | Disposition: A | Discharge status: Home / Self Care | Payer: Medicare Other | Attending: Internal Medicine | Admitting: Internal Medicine

## 2023-02-18 DIAGNOSIS — L03012 Cellulitis of left finger: Secondary | ICD-10-CM

## 2023-02-18 DIAGNOSIS — E11628 Type 2 diabetes mellitus with other skin complications: Secondary | ICD-10-CM | POA: Diagnosis not present

## 2023-02-18 DIAGNOSIS — Z794 Long term (current) use of insulin: Secondary | ICD-10-CM

## 2023-02-18 MED ORDER — CLINDAMYCIN HCL 300 MG PO CAPS: 300.0000 mg | ORAL_CAPSULE | Freq: Two times a day (BID) | ORAL | 0 refills | Status: AC

## 2023-02-18 NOTE — ED Triage Notes (Signed)
Pt states dx'd with gout to lt middle finger 3wks ago and now has swelling with blisters to same finger x1wk and now draining.

## 2023-02-18 NOTE — ED Provider Notes (Signed)
[facility]    CSN: [id number] Arrival date & time: 02/18/23  1713      History   Chief Complaint Chief Complaint  Patient presents with   Hand Pain    HPI Matthew [name] is a 82 y.o. male.   Patient presents to urgent care for evaluation of pain and swelling to the distal left middle digit that started 3 weeks ago.  He went to see his hand specialist to felt pain and swelling to be gout.  He was treated for gout but started experiencing what he describes as blisters to the tip of the finger 1 week ago. A couple of days ago, he popped one of this blisters with a clean needle at home and noticed pus drainage from the site. States the distal part of the finger became warm a couple of days ago as well. No numbness/tingling distally to lesion, fever/chills, or redness spreading proximally. He is a type 2 diabetic, last HA1C was 7.2. No recent antibiotic use.     Past Medical History:  Diagnosis Date   Cataract, nuclear sclerotic, right eye 11/20/2020   CLAUDICATION 05/14/2007   CORONARY ARTERY DISEASE 12/28/2006   Myoview 8/21: EF 73, normal perfusion, low risk    Cystoid macular edema of left eye 03/20/2021   Diabetes mellitus type II, controlled (HCC) 12/28/2006   Actos 30mg , insulin 70/30 20 units BID, amaryl 1mg  previously a1c <8. Worsened due to cold over 5 weeks around 05/2014 eating poorly and eating sugary syrups.   Stomach irritation on metformin.  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/20/2020   Vitrectomy 11/21/2021    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   Allergic rhinitis 11/18/2016   Cervical arthritis (HCC) 10/25/2014   Pulmonary nodule seen on imaging study 05/17/2012   Erectile dysfunction 08/13/2010   CKD (chronic kidney disease), stage III (HCC) 09/15/2008   PAD (peripheral artery disease) (HCC)-with claudication 05/14/2007   Mild nonproliferative diabetic retinopathy of both eyes (HCC) 12/28/2006   Hyperlipidemia associated with type 2 diabetes mellitus (HCC) 12/28/2006   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   Essential hypertension 12/28/2006    Coronary artery disease s/p CABG 1997 12/28/2006   Diabetes mellitus type II, controlled (HCC) 12/28/2006    Past Surgical History:  Procedure Laterality Date   ABDOMINAL AORTOGRAM W/LOWER EXTREMITY N/A 02/10/2020   Procedure: ABDOMINAL AORTOGRAM W/LOWER EXTREMITY;  Surgeon: Sherren Kerns,  MD;  Location: [facility] INVASIVE CV LAB;  Service: Cardiovascular;  Laterality: N/A;   CATARACT EXTRACTION Left 06/21/2020   CATARACT EXTRACTION Right    COLONOSCOPY     CORONARY ARTERY BYPASS GRAFT     ENDARTERECTOMY FEMORAL Left 02/27/2020   Procedure: LEFT COMMON FEMORAL ENDARTERECTOMY;  Surgeon: Sherren Kerns, MD;  Location: [facility] OR;  Service: Vascular;  Laterality: Left;   FEMORAL-POPLITEAL BYPASS GRAFT Left 02/27/2020   Procedure: LEFT FEMORAL-ABOVE KNEE POPLITEAL BYPASS WITH NON REVERSED GREATER SAPHENOUS  VEIN;  Surgeon: Sherren Kerns, MD;  Location: [facility] OR;  Service: Vascular;  Laterality: Left;   FEMORAL-POPLITEAL BYPASS GRAFT Right 04/23/2020   Procedure: BYPASS GRAFT FEMORAL- Above Knee POPLITEAL ARTERY RIGHT Using Right Greater Saphenous Vein;  Surgeon: Sherren Kerns, MD;  Location[facility] OR;  Service: Vascular;  Laterality: Right;   KNEE ARTHROSCOPY  2012   UPPER GASTROINTESTINAL ENDOSCOPY         Home Medications    Prior to Admission medications   Medication Sig Start Date End Date Taking? Authorizing Provider  clindamycin (CLEOCIN) 300 MG capsule Take 1 capsule (300 mg total) by mouth in the morning and at bedtime for 7 days. 02/18/23 02/25/23 Yes Carlisle Beers, FNP  aspirin EC 81 MG tablet Take 81 mg by mouth daily. Swallow whole.    [provider]  atorvastatin (LIPITOR) 40 MG tablet Take 1 tablet by mouth once daily 02/02/23   Shelva Majestic, MD  Azelastine HCl 137 MCG/SPRAY SOLN USE 1 SPRAY(S) IN EACH NOSTRIL TWICE DAILY AS DIRECTED 08/11/22   Shelva Majestic, MD  chlorthalidone (HYGROTON) 25 MG tablet Take 1 tablet by mouth once daily 10/24/22   Shelva Majestic, MD  cholecalciferol (VITAMIN D3) 25 MCG (1000 UNIT) tablet Take 1,000 Units by mouth daily.    [provider]  colchicine 0.6 MG tablet Take 1 tablet by mouth once daily 10/24/22   Shelva Majestic, MD  Continuous Glucose Receiver (FREESTYLE LIBRE 14 DAY READER) DEVI 2 each by Does not apply route daily. 11/11/22   Shelva Majestic, MD  Continuous Glucose Sensor (FREESTYLE LIBRE 14 DAY SENSOR) MISC 2 each by Does not apply route daily. 11/11/22   Shelva Majestic, MD  cyclobenzaprine (FLEXERIL) 5 MG tablet TAKE ONE TABLET BY MOUTH TWICE DAILY AS NEEDED FOR MUSCLE SPASM Patient not taking: Reported on 01/22/2023 07/16/18   Shelva Majestic, MD  fexofenadine (ALLEGRA) 180 MG tablet Take 180 mg by mouth daily.    [provider]  fluticasone (FLONASE) 50 MCG/ACT nasal spray USE 2  SPRAY(S) IN EACH NOSTRIL ONCE DAILY AS NEEDED 08/11/22   Shelva Majestic, MD  lisinopril (ZESTRIL) 10 MG tablet Take 1 tablet (10 mg total) by mouth daily. 01/22/23   Shelva Majestic, MD  metoprolol succinate (TOPROL-XL) 100 MG 24 hr tablet TAKE 1 TABLET EVERY DAY WITH OR IMMMEDIATELY FOLLOWING A MEAL 09/18/22   Shelva Majestic, MD  NIFEdipine (PROCARDIA-XL) 30 MG (OSM) 24 hr tablet Take 30 mg by mouth 2 (two) times daily.    [provider]  NOVOLIN 70/30 RELION (70-30) 100 UNIT/ML injection INJECT 22 UNITS INTO THE SKIN IN THE MORNING AND 20 UNITS IN THE EVENING 10/05/19   Shelva Majestic, MD  oxymetazoline (AFRIN) 0.05 % nasal spray Place 1 spray into both nostrils 2 (two) times daily as needed for congestion.    [provider]  pioglitazone (ACTOS) 30 MG tablet Take 1 tablet by  mouth once daily 10/24/22   Shelva Majestic, MD    Family History Family History  Problem Relation Age of Onset   Diabetes Mother    Hypertension Mother    Stroke Father    Colon cancer Neg Hx    Esophageal cancer Neg Hx    Rectal cancer Neg Hx    Stomach cancer Neg Hx     Social History Social History   Tobacco Use   Smoking status: Former    Current packs/day: 0.00    Types: Cigarettes    Quit date: 07/09/1968    Years since quitting: 54.6    Passive exposure: Never   Smokeless tobacco: Former    Types: Chew    Quit date: 07/09/1988   Tobacco comments:    minimal use x 10 years   Vaping Use   Vaping status: Never Used  Substance Use Topics   Alcohol use: No    Comment: last was 6 months   Drug use: No     Allergies   Penicillins, Probenecid, Tetracycline hcl, and Allopurinol   Review of Systems Review of Systems Per HPI  Physical Exam Triage Vital Signs ED Triage Vitals  Encounter Vitals Group     BP 02/18/23 1744 (!) 180/53     Systolic BP Percentile --      Diastolic BP Percentile --      Pulse Rate 02/18/23 1744 61     Resp 02/18/23 1744 20     Temp  02/18/23 1744 97.9 F (36.6 C)     Temp Source 02/18/23 1744 Oral     SpO2 02/18/23 1744 96 %     Weight --      Height --      Head Circumference --      Peak Flow --      Pain Score 02/18/23 1746 0     Pain Loc --      Pain Education --      Exclude from Growth Chart --    No data found.  Updated Vital Signs BP (!) 180/53 (BP Location: Left Arm)   Pulse 61   Temp 97.9 F (36.6 C) (Oral)   Resp 20   SpO2 96%   Visual Acuity Right Eye Distance:   Left Eye Distance:   Bilateral Distance:    Right Eye Near:   Left Eye Near:    Bilateral Near:     Physical Exam Vitals and nursing note reviewed.  Constitutional:      Appearance: He is not ill-appearing or toxic-appearing.  HENT:     Head: Normocephalic and atraumatic.     Right Ear: Hearing and external ear normal.     Left Ear: Hearing and external ear normal.     Nose: Nose normal.     Mouth/Throat:     Lips: Pink.  Eyes:     General: Lids are normal. Vision grossly intact. Gaze aligned appropriately.     Extraocular Movements: Extraocular movements intact.     Conjunctiva/sclera: Conjunctivae normal.  Pulmonary:     Effort: Pulmonary effort is normal.  Musculoskeletal:     Right hand: Normal.     Left hand: Swelling and tenderness present. No deformity, lacerations or bony tenderness. Normal range of motion. Normal strength. Normal sensation. There is no disruption of two-point discrimination. Normal capillary refill. Normal pulse.     Cervical back: Neck supple.     Comments: Soft tissue swelling, erythema, and purulent drainage to the  distal left middle digit as seen in image below. Strength and sensation intact distally to affected digit. +2 bilateral radial pulses. Less than 2 cap refill to left middle digit. Full ROM of left middle digit. Non-fluctuant soft tissue abscess to the pad of the left middle digit with evidence of purulent drainage and underlying induration. No lymphangitis.  Skin:    General:  Skin is warm and dry.     Capillary Refill: Capillary refill takes less than 2 seconds.     Findings: No rash.  Neurological:     General: No focal deficit present.     Mental Status: He is alert and oriented to person, place, and time. Mental status is at baseline.     Cranial Nerves: No dysarthria or facial asymmetry.  Psychiatric:        Mood and Affect: Mood normal.        Speech: Speech normal.        Behavior: Behavior normal.        Thought Content: Thought content normal.        Judgment: Judgment normal.    Left middle digit   Pad of left middle digit   Pad of left middle digit    UC Treatments / Results  Labs (all labs ordered are listed, but only abnormal results are displayed) Labs Reviewed - No data to display  EKG   Radiology No results found.  Procedures Procedures (including critical care time)  Medications Ordered in UC Medications - No data to display  Initial Impression / Assessment and Plan / UC Course  I have reviewed the triage vital signs and the nursing notes.  Pertinent labs & imaging results that were available during my care of the patient were reviewed by me and considered in my medical decision making (see chart for details).   1. Felon of finger of left hand Reviewed note from hand specialist 3 weeks ago, imaging performed showing no acute finding of the left hand 3 weeks ago.  Today, presentation consistent with felon of the left middle digit. Neurovascularly intact distally.  Will treat with clindamycin 300mg  BID for 7 days with food. He is allergic to penicillins and tetracyclines.  Deferred use of bactrim due to ACE inhibitor use and risk for hyperkalemia.  If no improvement in symptoms, or if worsening swelling/drainage,signs of infection in 12-24 hours after using antibiotic, advised to go to ER. Otherwise, follow-up with hand specialist as needed.  Counseled patient on potential for adverse effects with medications  prescribed/recommended today, strict ER and return-to-clinic precautions discussed, patient verbalized understanding.    Final Clinical Impressions(s) / UC Diagnoses   Final diagnoses:  Felon of finger of left hand  Type 2 diabetes mellitus with other skin complication, with long-term current use of insulin (HCC)     Discharge Instructions      Take clindamycin as prescribed. Take with food to avoid stomach upset. Epsom salt soaks every 4-6 hours. New signs of infection such as redness, worsening swelling, or lack of improvement in symptoms, go to ER. Otherwise, follow-up with PCP as needed  Feel better!     ED Prescriptions     Medication Sig Dispense Auth. Provider   clindamycin (CLEOCIN) 300 MG capsule Take 1 capsule (300 mg total) by mouth in the morning and at bedtime for 7 days. 14 capsule Carlisle Beers, FNP      PDMP not reviewed this encounter.   Carlisle Beers, [place] 02/18/23 2144

## 2023-02-18 NOTE — Discharge Instructions (Addendum)
Take clindamycin as prescribed. Take with food to avoid stomach upset. Epsom salt soaks every 4-6 hours. New signs of infection such as redness, worsening swelling, or lack of improvement in symptoms, go to ER. Otherwise, follow-up with PCP as needed  Feel better!

## 2023-02-19 ENCOUNTER — Telehealth: Payer: Self-pay | Admitting: Family Medicine

## 2023-02-19 NOTE — Telephone Encounter (Signed)
Patient requests to be called asap re: Having difficulty with Company that supplies Diabetic Sensors [employer]). [employer] ph# [phone].

## 2023-02-20 NOTE — Telephone Encounter (Signed)
Aeronautical engineer and spoke to Rodel in the clinical review department. Told her calling to see why pt has not been able to get his [facility] [employer] sensors. Told her the person I spoke to before you said you did receive the office note from 01/22/2023. Rodel said it is not documented clearly that patient is using [employer] sensors need to update documentation. Told her okay, provider will be back Monday, what is the fax number? Rodel said [phone].

## 2023-02-20 NOTE — Telephone Encounter (Signed)
Called pt back told him I spoke to the company and they said last office note needs to be update to say you are using [employer]. Told him Dr. Durene Cal will be back on Monday and I will send him message to update note and fax back. Pt verbalized understanding and said this has been an ongoing problem. Told him I see that, have you checked with insurance to see if we can send a prescription to your pharmacy instead? Pt said no, but I am going to call them.

## 2023-02-20 NOTE — Telephone Encounter (Signed)
Dr. Durene Cal, please update patients chart note for 01/22/2023 to say he is using the [facility] sensors continuously. They said it is clearly not documented that he is actually using them. Then have Ardine Bjork re fax office note to [employer] at [phone]. Thanks

## 2023-02-20 NOTE — Telephone Encounter (Signed)
Spoke to pt asked him how can I help you? Pt said he is having a hard time getting his sensors for his [employer] filled. He said was told we need to call the place. Asked him if it is the number you gave? Pt said yes. Told him I will call now and get back to you. Pt verbalized understanding.

## 2023-02-21 NOTE — Telephone Encounter (Signed)
After: -Patient requires use of CONTINUOUS GLUCOSE MONITOR to monitor blood sugars. He is actively using this continuously. History of lows at 09/19/22 and meter helpful  Before: -Patient requires use of CONTINUOUS GLUCOSE MONITOR to monitor blood sugars. continuously. History of lows at 09/19/22 and meter helpful  Note: not sure how much more clear we can be- we already updated the first time with exactly what they asked for

## 2023-02-23 NOTE — Telephone Encounter (Signed)
Printed and refaxed.

## 2023-02-24 NOTE — Telephone Encounter (Signed)
See below, we have been putting what they request in the addendum. Is it ok to combine the below from your addended note and put it in letter form and send along with what I re-faxed yesterday?

## 2023-02-24 NOTE — Telephone Encounter (Signed)
Letter has been rewritten and re faxed for pt, if this is not enough I am not sure what else they are wanting from [place] as we have done what they have requested each time.

## 2023-02-24 NOTE — Telephone Encounter (Addendum)
Patient states he spoke with [facility] for an update. States they informed him that they got the notes from PCP but the notes didn't contain the information that they needed. Information needs to contain a statement stating patient is still using the freestyle libre 2 continuous glucose monitor and why he is using it. Can this be completed and re-faxed to [phone].

## 2023-02-26 ENCOUNTER — Ambulatory Visit (INDEPENDENT_AMBULATORY_CARE_PROVIDER_SITE_OTHER): Payer: Medicare Other | Admitting: Family

## 2023-02-26 VITALS — BP 138/75 | HR 62 | Temp 98.0°F | Ht 72.0 in | Wt 203.6 lb

## 2023-02-26 DIAGNOSIS — L089 Local infection of the skin and subcutaneous tissue, unspecified: Secondary | ICD-10-CM | POA: Diagnosis not present

## 2023-02-26 DIAGNOSIS — M1A9XX1 Chronic gout, unspecified, with tophus (tophi): Secondary | ICD-10-CM | POA: Diagnosis not present

## 2023-02-26 MED ORDER — DOXYCYCLINE HYCLATE 100 MG PO TABS
100.0000 mg | ORAL_TABLET | Freq: Two times a day (BID) | ORAL | 0 refills | Status: AC
Start: 2023-02-26 — End: 2023-03-05

## 2023-02-26 MED ORDER — FEBUXOSTAT 40 MG PO TABS: 40.0000 mg | ORAL_TABLET | Freq: Every day | ORAL | 2 refills | Status: DC

## 2023-02-26 NOTE — Assessment & Plan Note (Addendum)
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

## 2023-02-26 NOTE — Progress Notes (Signed)
Patient ID: Matthew [name], male    DOB: [birth year], 82 y.o.   MRN: [id number]  Chief Complaint  Patient presents with   Hand Pain    Pt c/o left hand middle finger pain, Seen in ED on 8/21. DX with an infection. Has tried Epson salt which does not help. Finger is getting worse.     HPI:      Finger pain:  left middle finger tip on ventral side; seen first by ortho for gout in finger, then developed cyst, popped it and pus came out, seen by ED on 8/21 and given Clindamycin. Today he reports he is on his last day of abt and his finger is better but still draining.   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  Dispense: 30 tablet; Refill: 2  Infected finger- finishing clindamycin today, advised to start Doxy tomorrow. Continue to wash finger with soap and water, ok to soak just for a 3-5 minutes in epsom water, cover with bandage. Do not use alcohol, peroxide or antibiotic ointment. Call back if finger still not improved next week.  -     Doxycycline Hyclate; Take 1 tablet (100 mg total) by mouth 2 (two) times daily for 7 days.  Dispense: 14 tablet;  Refill: 0  Subjective:    Outpatient Medications Prior to Visit  Medication Sig Dispense Refill   aspirin EC 81 MG tablet Take 81 mg by mouth daily. Swallow whole.     atorvastatin (LIPITOR) 40 MG tablet Take 1 tablet by mouth once daily 90 tablet 0   Azelastine HCl 137 MCG/SPRAY SOLN USE 1 SPRAY(S) IN EACH NOSTRIL TWICE DAILY AS DIRECTED 30 mL 0   chlorthalidone (HYGROTON) 25 MG tablet Take 1 tablet by mouth once daily 90 tablet 0   cholecalciferol (VITAMIN D3) 25 MCG (1000 UNIT) tablet Take 1,000 Units by mouth daily.     colchicine 0.6 MG tablet Take 1 tablet by mouth once daily 30 tablet 0   Continuous Glucose Receiver (FREESTYLE LIBRE 14 DAY READER) DEVI 2 each by Does not apply route daily. 2 each 11   Continuous Glucose Sensor (FREESTYLE LIBRE 14 DAY SENSOR) MISC 2 each by Does not apply route daily. 2 each 11   cyclobenzaprine (FLEXERIL) 5 MG tablet TAKE ONE TABLET BY MOUTH TWICE DAILY AS NEEDED FOR MUSCLE SPASM 30 tablet 0   fexofenadine (ALLEGRA) 180 MG tablet Take 180 mg by mouth daily.     fluticasone (FLONASE) 50 MCG/ACT nasal spray USE 2 SPRAY(S) IN EACH NOSTRIL ONCE DAILY AS NEEDED 16 g 0  lisinopril (ZESTRIL) 10 MG tablet Take 1 tablet (10 mg total) by mouth daily. 90 tablet 3   metoprolol succinate (TOPROL-XL) 100 MG 24 hr tablet TAKE 1 TABLET EVERY DAY WITH OR IMMMEDIATELY FOLLOWING A MEAL 90 tablet 3   NIFEdipine (PROCARDIA-XL) 30 MG (OSM) 24 hr tablet Take 30 mg by mouth 2 (two) times daily.     NOVOLIN 70/30 RELION (70-30) 100 UNIT/ML injection INJECT 22 UNITS INTO THE SKIN IN THE MORNING AND 20 UNITS IN THE EVENING 10 mL 0   oxymetazoline (AFRIN) 0.05 % nasal spray Place 1 spray into both nostrils 2 (two) times daily as needed for congestion.     pioglitazone (ACTOS) 30 MG tablet Take 1 tablet by mouth once daily 90 tablet 0   No facility-administered medications prior to visit.   Past Medical History:  Diagnosis Date   Cataract, nuclear sclerotic, right eye  11/20/2020   CLAUDICATION 05/14/2007   CORONARY ARTERY DISEASE 12/28/2006   Myoview 8/21: EF 73, normal perfusion, low risk    Cystoid macular edema of left eye 03/20/2021   Diabetes mellitus type II, controlled (HCC) 12/28/2006   Actos 30mg , insulin 70/30 20 units BID, amaryl 1mg  previously a1c <8. Worsened due to cold over 5 weeks around 05/2014 eating poorly and eating sugary syrups.   Stomach irritation on metformin.  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   Vitrectomy 11/21/2021   Past Surgical History:  Procedure Laterality Date   ABDOMINAL AORTOGRAM W/LOWER EXTREMITY N/A 02/10/2020   Procedure: ABDOMINAL AORTOGRAM W/LOWER EXTREMITY;  Surgeon: Sherren Kerns, MD;  Location: [facility];  Service: Cardiovascular;  Laterality: N/A;   CATARACT EXTRACTION Left 06/21/2020   CATARACT EXTRACTION Right    COLONOSCOPY     CORONARY ARTERY BYPASS GRAFT     ENDARTERECTOMY FEMORAL Left 02/27/2020   Procedure: LEFT COMMON FEMORAL ENDARTERECTOMY;  Surgeon: Sherren Kerns, MD;  Location: [facility] OR;  Service: Vascular;  Laterality: Left;   FEMORAL-POPLITEAL BYPASS GRAFT Left 02/27/2020   Procedure: LEFT FEMORAL-ABOVE KNEE POPLITEAL BYPASS WITH NON REVERSED GREATER SAPHENOUS VEIN;  Surgeon: Sherren Kerns, MD;  Location: [facility] OR;  Service: Vascular;  Laterality: Left;   FEMORAL-POPLITEAL BYPASS GRAFT Right 04/23/2020    Procedure: BYPASS GRAFT FEMORAL- Above Knee POPLITEAL ARTERY RIGHT Using Right Greater Saphenous Vein;  Surgeon: Sherren Kerns, MD;  Location: [facility] OR;  Service: Vascular;  Laterality: Right;   KNEE ARTHROSCOPY  2012   UPPER GASTROINTESTINAL ENDOSCOPY     Allergies  Allergen Reactions   Penicillins Hives   Probenecid     Developed lump on chest- went away when he stopped the medicine   Tetracycline Hcl Hives   Allopurinol Itching      Objective:    Physical Exam Vitals and nursing note reviewed.  Constitutional:  General: He is not in acute distress.    Appearance: Normal appearance.  HENT:     Head: Normocephalic.  Cardiovascular:     Rate and Rhythm: Normal rate and regular rhythm.  Pulmonary:     Effort: Pulmonary effort is normal.     Breath sounds: Normal breath sounds.  Musculoskeletal:        General: Normal range of motion.     Cervical back: Normal range of motion.  Skin:    General: Skin is warm and dry.     Findings: Abscess (left middle finger tip, oozing yellowish drainage, blistered skin around finger tip, no erythema noted, tip and distal to 1st metatarsal joint swollen, tender to palpation) present.  Neurological:     Mental Status: He is alert and oriented to person, place, and time.  Psychiatric:        Mood and Affect: Mood normal.    BP 138/75   Pulse 62   Temp 98 F (36.7 C) (Temporal)   Ht 6' (1.829 m)   Wt 203 lb 9.6 oz (92.4 kg)   SpO2 95%   BMI 27.61 kg/m  Wt Readings from Last 3 Encounters:  02/26/23 203 lb 9.6 oz (92.4 kg)  01/22/23 207 lb (93.9 kg)  12/10/22 205 lb (93 kg)      Dulce Sellar, NP

## 2023-02-27 ENCOUNTER — Telehealth: Payer: Self-pay

## 2023-02-27 ENCOUNTER — Other Ambulatory Visit (HOSPITAL_COMMUNITY): Payer: Self-pay

## 2023-02-27 ENCOUNTER — Telehealth: Payer: Self-pay | Admitting: Family Medicine

## 2023-02-27 NOTE — Telephone Encounter (Signed)
Pt states pharmacy let him know he needed a PA for the RX febuxostat (ULORIC) 40 MG tablet  Please advise.

## 2023-02-27 NOTE — Telephone Encounter (Signed)
Also, if there is a generic RX for the same meds so he can get the RX sooner, he does not want to wait for PA. Please advise.

## 2023-02-27 NOTE — Telephone Encounter (Signed)
Pharmacy Patient Advocate Encounter  Insurance verification completed.    The patient is insured through [employer] test claim for Febuxostat 40mg . Currently a quantity of 30 tabs is a 30 day supply and the co-pay is Product/drug not on formulary. Try Probenecid-colchicine, allopurinol 100mg , allpourinol 300mg .  Probenecid-colchicine - $38.10 Allopurino100mg  - $2.61 Allopurino300mg  - $4.80 Colchicine - $21.30  **Patient did not want to wait for a prior authorization for the medication**   This test claim was processed through [facility]- copay amounts may vary at other pharmacies due to pharmacy/plan contracts, or as the patient moves through the different stages of their insurance plan.

## 2023-03-03 DIAGNOSIS — N1831 Chronic kidney disease, stage 3a: Secondary | ICD-10-CM | POA: Diagnosis not present

## 2023-03-09 ENCOUNTER — Other Ambulatory Visit: Payer: Self-pay | Admitting: Family Medicine

## 2023-03-23 DIAGNOSIS — H35341 Macular cyst, hole, or pseudohole, right eye: Secondary | ICD-10-CM | POA: Diagnosis not present

## 2023-03-23 DIAGNOSIS — H35352 Cystoid macular degeneration, left eye: Secondary | ICD-10-CM | POA: Diagnosis not present

## 2023-03-23 DIAGNOSIS — H34832 Tributary (branch) retinal vein occlusion, left eye, with macular edema: Secondary | ICD-10-CM | POA: Diagnosis not present

## 2023-03-23 DIAGNOSIS — E113291 Type 2 diabetes mellitus with mild nonproliferative diabetic retinopathy without macular edema, right eye: Secondary | ICD-10-CM | POA: Diagnosis not present

## 2023-03-23 DIAGNOSIS — E113212 Type 2 diabetes mellitus with mild nonproliferative diabetic retinopathy with macular edema, left eye: Secondary | ICD-10-CM | POA: Diagnosis not present

## 2023-03-23 LAB — HM DIABETES EYE EXAM

## 2023-04-02 DIAGNOSIS — Z23 Encounter for immunization: Secondary | ICD-10-CM | POA: Diagnosis not present

## 2023-04-06 ENCOUNTER — Encounter: Payer: Self-pay | Admitting: Nurse Practitioner

## 2023-04-12 ENCOUNTER — Other Ambulatory Visit: Payer: Self-pay | Admitting: Family Medicine

## 2023-04-28 ENCOUNTER — Other Ambulatory Visit: Payer: Self-pay | Admitting: Family Medicine

## 2023-04-28 ENCOUNTER — Other Ambulatory Visit: Payer: Self-pay

## 2023-04-28 ENCOUNTER — Other Ambulatory Visit (HOSPITAL_COMMUNITY): Payer: Self-pay

## 2023-04-28 DIAGNOSIS — E11319 Type 2 diabetes mellitus with unspecified diabetic retinopathy without macular edema: Secondary | ICD-10-CM

## 2023-04-28 MED ORDER — NOVOLIN 70/30 RELION (70-30) 100 UNIT/ML ~~LOC~~ SUSP
SUBCUTANEOUS | 0 refills | Status: DC
Start: 2023-04-28 — End: 2023-04-28
  Filled 2023-04-28: qty 10, fill #0

## 2023-04-28 MED ORDER — NOVOLIN 70/30 RELION (70-30) 100 UNIT/ML ~~LOC~~ SUSP
SUBCUTANEOUS | 0 refills | Status: DC
  Filled 2023-04-28: qty 10, 23d supply, fill #0

## 2023-04-29 ENCOUNTER — Other Ambulatory Visit: Payer: Self-pay

## 2023-04-29 ENCOUNTER — Other Ambulatory Visit (HOSPITAL_COMMUNITY): Payer: Self-pay

## 2023-04-29 ENCOUNTER — Other Ambulatory Visit: Payer: Self-pay | Admitting: Family Medicine

## 2023-04-29 DIAGNOSIS — E11319 Type 2 diabetes mellitus with unspecified diabetic retinopathy without macular edema: Secondary | ICD-10-CM

## 2023-04-29 MED ORDER — NOVOLIN 70/30 RELION (70-30) 100 UNIT/ML ~~LOC~~ SUSP
SUBCUTANEOUS | 0 refills | Status: AC
  Filled 2023-04-29: qty 10, 23d supply, fill #0
  Filled 2023-04-29: qty 10, 24d supply, fill #0

## 2023-05-11 ENCOUNTER — Other Ambulatory Visit: Payer: Self-pay

## 2023-05-14 ENCOUNTER — Other Ambulatory Visit (HOSPITAL_COMMUNITY): Payer: Self-pay

## 2023-05-14 ENCOUNTER — Telehealth: Payer: Self-pay | Admitting: Pharmacy Technician

## 2023-05-14 NOTE — Telephone Encounter (Signed)
Pharmacy Patient Advocate Encounter   Received notification from CoverMyMeds that prior authorization for Febuxostat 40MG  tablets is required/requested.   Insurance verification completed.   The patient is insured through [employer] .   Per test claim: PA required; PA submitted to above mentioned insurance via CoverMyMeds Key/confirmation #/EOC [id number] Status is pending

## 2023-05-14 NOTE — Telephone Encounter (Signed)
I called pt and Lvm in regards to Approval.

## 2023-05-14 NOTE — Telephone Encounter (Signed)
Pharmacy Patient Advocate Encounter  Received notification from [facility] that Prior Authorization for Febuxostat 40MG  tablets  has been APPROVED from 06/30/22 to 06/29/24. Ran test claim, Copay is $7.17. This test claim was processed through [facility]- copay amounts may vary at other pharmacies due to pharmacy/plan contracts, or as the patient moves through the different stages of their insurance plan.   PA #/Case ID/Reference #: [id number]

## 2023-05-18 ENCOUNTER — Other Ambulatory Visit: Payer: Self-pay

## 2023-05-18 DIAGNOSIS — M1A9XX1 Chronic gout, unspecified, with tophus (tophi): Secondary | ICD-10-CM

## 2023-05-18 MED ORDER — FEBUXOSTAT 40 MG PO TABS: 40.0000 mg | ORAL_TABLET | Freq: Every day | ORAL | 2 refills | Status: DC

## 2023-05-18 NOTE — Telephone Encounter (Signed)
Rx sent to requested pharmacy

## 2023-05-18 NOTE — Telephone Encounter (Signed)
Patient called back and states new rx for medication has not been called in to his pharmacy. Patient requests febuxostat (ULORIC) 40 MG tablet  to [facility] on [address].

## 2023-06-06 ENCOUNTER — Other Ambulatory Visit: Payer: Self-pay | Admitting: Family Medicine

## 2023-06-12 ENCOUNTER — Other Ambulatory Visit: Payer: Self-pay | Admitting: Family Medicine

## 2023-06-12 DIAGNOSIS — H0102A Squamous blepharitis right eye, upper and lower eyelids: Secondary | ICD-10-CM | POA: Diagnosis not present

## 2023-06-12 DIAGNOSIS — E119 Type 2 diabetes mellitus without complications: Secondary | ICD-10-CM | POA: Diagnosis not present

## 2023-06-12 DIAGNOSIS — H43823 Vitreomacular adhesion, bilateral: Secondary | ICD-10-CM | POA: Diagnosis not present

## 2023-06-12 DIAGNOSIS — H0102B Squamous blepharitis left eye, upper and lower eyelids: Secondary | ICD-10-CM | POA: Diagnosis not present

## 2023-06-12 DIAGNOSIS — Z794 Long term (current) use of insulin: Secondary | ICD-10-CM | POA: Diagnosis not present

## 2023-06-12 DIAGNOSIS — Z961 Presence of intraocular lens: Secondary | ICD-10-CM | POA: Diagnosis not present

## 2023-06-12 DIAGNOSIS — H0014 Chalazion left upper eyelid: Secondary | ICD-10-CM | POA: Diagnosis not present

## 2023-06-12 DIAGNOSIS — Z9889 Other specified postprocedural states: Secondary | ICD-10-CM | POA: Diagnosis not present

## 2023-06-15 ENCOUNTER — Other Ambulatory Visit: Payer: Self-pay

## 2023-06-15 MED ORDER — PIOGLITAZONE HCL 30 MG PO TABS: 30.0000 mg | ORAL_TABLET | Freq: Every day | ORAL | 3 refills | Status: AC

## 2023-07-07 ENCOUNTER — Other Ambulatory Visit: Payer: Self-pay | Admitting: Family Medicine

## 2023-07-31 ENCOUNTER — Ambulatory Visit (INDEPENDENT_AMBULATORY_CARE_PROVIDER_SITE_OTHER): Payer: Medicare Other | Admitting: Family Medicine

## 2023-07-31 ENCOUNTER — Encounter: Payer: Self-pay | Admitting: Family Medicine

## 2023-07-31 VITALS — BP 128/60 | Temp 98.1°F | Ht 72.0 in | Wt 205.6 lb

## 2023-07-31 DIAGNOSIS — I10 Essential (primary) hypertension: Secondary | ICD-10-CM | POA: Diagnosis not present

## 2023-07-31 DIAGNOSIS — E1169 Type 2 diabetes mellitus with other specified complication: Secondary | ICD-10-CM | POA: Diagnosis not present

## 2023-07-31 DIAGNOSIS — M1A9XX1 Chronic gout, unspecified, with tophus (tophi): Secondary | ICD-10-CM | POA: Diagnosis not present

## 2023-07-31 DIAGNOSIS — I700 Atherosclerosis of aorta: Secondary | ICD-10-CM

## 2023-07-31 DIAGNOSIS — N183 Chronic kidney disease, stage 3 unspecified: Secondary | ICD-10-CM

## 2023-07-31 DIAGNOSIS — E785 Hyperlipidemia, unspecified: Secondary | ICD-10-CM

## 2023-07-31 DIAGNOSIS — E11319 Type 2 diabetes mellitus with unspecified diabetic retinopathy without macular edema: Secondary | ICD-10-CM

## 2023-07-31 DIAGNOSIS — Z794 Long term (current) use of insulin: Secondary | ICD-10-CM

## 2023-07-31 DIAGNOSIS — L989 Disorder of the skin and subcutaneous tissue, unspecified: Secondary | ICD-10-CM

## 2023-07-31 LAB — CBC WITH DIFFERENTIAL/PLATELET
Basophils Absolute: 0 10*3/uL (ref 0.0–0.1)
Basophils Relative: 0.3 % (ref 0.0–3.0)
Eosinophils Absolute: 0.3 10*3/uL (ref 0.0–0.7)
Eosinophils Relative: 5.4 % — ABNORMAL HIGH (ref 0.0–5.0)
HCT: 43.5 % (ref 39.0–52.0)
Hemoglobin: 14.1 g/dL (ref 13.0–17.0)
Lymphocytes Relative: 19.1 % (ref 12.0–46.0)
Lymphs Abs: 0.9 10*3/uL (ref 0.7–4.0)
MCHC: 32.3 g/dL (ref 30.0–36.0)
MCV: 90.2 fL (ref 78.0–100.0)
Monocytes Absolute: 0.4 10*3/uL (ref 0.1–1.0)
Monocytes Relative: 7.3 % (ref 3.0–12.0)
Neutro Abs: 3.4 10*3/uL (ref 1.4–7.7)
Neutrophils Relative %: 67.9 % (ref 43.0–77.0)
Platelets: 188 10*3/uL (ref 150.0–400.0)
RBC: 4.83 Mil/uL (ref 4.22–5.81)
RDW: 14.1 % (ref 11.5–15.5)
WBC: 5 10*3/uL (ref 4.0–10.5)

## 2023-07-31 LAB — COMPREHENSIVE METABOLIC PANEL
ALT: 11 U/L (ref 0–53)
AST: 20 U/L (ref 0–37)
Albumin: 4.1 g/dL (ref 3.5–5.2)
Alkaline Phosphatase: 61 U/L (ref 39–117)
BUN: 36 mg/dL — ABNORMAL HIGH (ref 6–23)
CO2: 31 meq/L (ref 19–32)
Calcium: 9.5 mg/dL (ref 8.4–10.5)
Chloride: 103 meq/L (ref 96–112)
Creatinine, Ser: 1.77 mg/dL — ABNORMAL HIGH (ref 0.40–1.50)
GFR: 35.72 mL/min — ABNORMAL LOW (ref >60.00)
Glucose, Bld: 94 mg/dL (ref 70–99)
Potassium: 4.8 meq/L (ref 3.5–5.1)
Sodium: 139 meq/L (ref 135–145)
Total Bilirubin: 0.6 mg/dL (ref 0.2–1.2)
Total Protein: 8 g/dL (ref 6.0–8.3)

## 2023-07-31 LAB — MICROALBUMIN / CREATININE URINE RATIO
Creatinine,U: 116.3 mg/dL
Microalb Creat Ratio: 6.8 mg/g (ref 0.0–30.0)
Microalb, Ur: 7.9 mg/dL — ABNORMAL HIGH (ref 0.0–1.9)

## 2023-07-31 LAB — HEMOGLOBIN A1C: Hgb A1c MFr Bld: 7 % — ABNORMAL HIGH (ref 4.6–6.5)

## 2023-07-31 LAB — URIC ACID: Uric Acid, Serum: 5.3 mg/dL (ref 4.0–7.8)

## 2023-07-31 NOTE — Progress Notes (Signed)
Phone [phone] In person visit   Subjective:   Matthew [name] is a 83 y.o. year old very pleasant male patient who presents for/with See problem oriented charting Chief Complaint  Patient presents with   scalp infection    Pt c/o white area on scalp that he noticed about 2 months ago.   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) 12/28/2006    Priority: High    Coronary artery disease s/p CABG 1997 12/28/2006    Priority: High   Diabetes mellitus type II, controlled (HCC) 12/28/2006    Priority: High   Aortic atherosclerosis (HCC) 01/31/2022    Priority: Medium    Cervical arthritis (HCC) 10/25/2014    Priority: Medium    Erectile dysfunction 08/13/2010    Priority: Medium    CKD (chronic kidney disease), stage III (HCC) 09/15/2008    Priority: Medium    Hyperlipidemia associated with type 2 diabetes mellitus (HCC) 12/28/2006    Priority: Medium    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    Priority: Medium    Essential hypertension 12/28/2006    Priority: Medium    Allergic rhinitis 11/18/2016    Priority: Low   Pulmonary nodule seen on imaging study 05/17/2012    Priority: Low   Both eyes affected by mild nonproliferative diabetic retinopathy with macular edema, associated with type 2 diabetes mellitus (HCC) 09/19/2022   Full thickness macular hole, right 11/13/2021   Cystoid macular edema of left eye 03/20/2021   Type 2 macular telangiectasis, 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 Swallow whole.     atorvastatin (LIPITOR) 40 MG tablet Take 1 tablet by mouth once  daily 90 tablet 0   Azelastine HCl 137 MCG/SPRAY SOLN USE 1 SPRAY(S) IN EACH NOSTRIL TWICE DAILY AS DIRECTED 30 mL 0   chlorthalidone (HYGROTON) 25 MG tablet Take 1 tablet by mouth once daily 90 tablet 0   cholecalciferol (VITAMIN D3) 25 MCG (1000 UNIT) tablet Take 1,000 Units by mouth daily.     colchicine 0.6 MG tablet Take 1 tablet by mouth once daily 30 tablet 0   Continuous Glucose Receiver (FREESTYLE LIBRE 14 DAY READER) DEVI 2 each by Does not apply route daily. 2 each 11   Continuous Glucose Sensor (FREESTYLE LIBRE 14 DAY SENSOR) MISC 2 each by Does not apply route daily. 2 each 11   cyclobenzaprine (FLEXERIL) 5 MG tablet TAKE ONE TABLET BY MOUTH TWICE DAILY AS NEEDED FOR MUSCLE SPASM 30 tablet 0   febuxostat (ULORIC) 40 MG tablet Take 1 tablet (40 mg total) by mouth daily. 30 tablet 2   fexofenadine (ALLEGRA) 180 MG tablet Take 180 mg by mouth daily.     fluticasone (FLONASE) 50 MCG/ACT nasal spray USE 2 SPRAY(S) IN EACH NOSTRIL ONCE DAILY AS NEEDED 16 g 0   insulin NPH-regular Human (NOVOLIN 70/30 RELION) (70-30) 100 UNIT/ML injection INJECT 22 UNITS INTO THE SKIN IN THE MORNING AND 20 UNITS IN THE EVENING 10 mL 0   lisinopril (ZESTRIL) 10 MG tablet Take 1 tablet (10 mg total) by mouth daily. 90 tablet  3   metoprolol succinate (TOPROL-XL) 100 MG 24 hr tablet TAKE 1 TABLET EVERY DAY WITH OR IMMMEDIATELY FOLLOWING A MEAL 90 tablet 3   NIFEdipine (PROCARDIA-XL) 30 MG (OSM) 24 hr tablet Take 30 mg by mouth 2 (two) times daily.     oxymetazoline (AFRIN) 0.05 % nasal spray Place 1 spray into both nostrils 2 (two) times daily as needed for congestion.     pioglitazone (ACTOS) 30 MG tablet Take 1 tablet (30 mg total) by mouth daily. 90 tablet 3   No current facility-administered medications for this visit.     Objective:  BP 128/60   Temp 98.1 F (36.7 C)   Ht 6' (1.829 m)   Wt 205 lb 9.6 oz (93.3 kg)   BMI 27.88 kg/m  Gen: NAD, resting comfortably CV: RRR no murmurs rubs or  gallops Lungs: CTAB no crackles, wheeze, rhonchi Ext: no edema Skin: warm, dry  Scalp lesion- just beyond instrument at least 1.5 x 1.5 cm darker brown discolration covered with whitish plaque and hair orients differently directly over it      Assessment and Plan   #social update- still supporting wife with multiple myeloma- had to be in [place] for 6 weeks   #Skin lesion S: Patient notes whitish discoloration in his scalp that he has noticed for about 2 months- feels growing slightly- notes a darker base to it but covered with whitish scale . Part of it will fall off sometimes with combing but comes back quickly ROS-not ill appearing, no fever/chills. No new medications. Not immunocompromised. No mucus membrane involvement.  A/P: right scalp lesion just behind the right ear present for 2 months and worsening- I suspect seborrheic keratosis but want expert opinion- referral to Dr. Onalee Hua with [facility] -He is honest that his wife's cancer situation makes him more anxious about this lesion which I completely understand and I tried to provide some comfort  # PAD-follows with vascular surgery- with right femoral to above knee popliteal bypass Dr. Darrick Penna October 2021 and left femoral endarteretomy and left femoral to above knee popliteal artery bypass in August 2021 # CAD-follows with cardiology Dr. Excell Seltzer #hyperlipidemia S: Medication:Aspirin 81 mg, atorvastatin 40 mg Symptoms:no chest pain or shortness of breath. No claudication  Lab Results  Component Value Date   CHOL 116 09/19/2022   HDL 42.90 09/19/2022   LDLCALC 59 09/19/2022   LDLDIRECT 69.0 03/11/2021   TRIG 74.0 09/19/2022   CHOLHDL 3 09/19/2022  A/P: CAD- asymptomatic continue current medications  PAD-asymptomatic continue current medications   Hyperlipidemia-too soon for repeat lipids- due next visit   # Diabetes-tolerate A1c up to 8.5 to avoid lows S: Medication:Actos 30 mg daily, Novolin 70/30 with 22  units in the morning and 20 units in the evening -GI side effects on metformin CBGs- uses freestyle libre.  -Patient requires use of CONTINUOUS GLUCOSE MONITOR to monitor blood sugars. History of lows at 09/19/22 and meter helpful- got as low as 40 once- doesn't have log book- missed a meal trying to care for wife- strongly discouraged missing meals Lab Results  Component Value Date   HGBA1C 7.4 (H) 01/22/2023   HGBA1C 7.2 (H) 09/19/2022   HGBA1C 7.0 (A) 09/19/2022  A/P: diabetes hopefully stable- update a1c today. Continue current meds for now . Discussed importance of not missing meals and reducing dose if any further lows  #hypertension # CKD stage III-GFR typically low 30s- 40's (last gfr with [place] [facility] at 86) - sees [facility] Dr. Ronalee Belts seen in august S: medication: Lisinopril 10 mg, chlorthalidone 25 mg, Procardia 30 mg twice daily, metoprolol 100 mg extended release BP Readings from Last 3 Encounters:  07/31/23 128/60  02/26/23 138/75  02/18/23 (!) 180/53   A/P: chronic kidney disease III stable- continue current medicines  Hypertension stable- continue current medicines   #Gout S: Medication: Uloric 40 mg- no recent flares As needed: Colchicine A/P:no flares- update uric acid and continue current medication for now  Recommended follow up: Return in about 4 months (around 11/28/2023) for followup or sooner if needed.Schedule b4 you leave. Future Appointments  Date Time Provider Department Center  09/07/2023  8:40 AM Tonny Bollman, MD [id number]    Lab/Order associations:   ICD-10-CM   1. Skin lesion  L98.9 Ambulatory referral to Dermatology    2. Lesion of skin of scalp  L98.9 Ambulatory referral to Dermatology    3. Controlled type 2 diabetes mellitus with retinopathy, with long-term current use of insulin, macular edema presence unspecified, unspecified laterality, unspecified retinopathy severity (HCC)  E11.319 Hemoglobin A1c   Z79.4 Urine  Microalbumin w/creat. ratio    4. Essential hypertension  I10 Comprehensive metabolic panel    CBC with Differential/Platelet    5. Stage 3 chronic kidney disease, unspecified whether stage 3a or 3b CKD (HCC)  N18.30     6. Aortic atherosclerosis (HCC)  I70.0     7. Gout with tophi  M1A.9XX1 Uric acid    8. Hyperlipidemia associated with type 2 diabetes mellitus (HCC)  E11.69 Comprehensive metabolic panel   Z61.0 CBC with Differential/Platelet      No orders of the defined types were placed in this encounter.   Return precautions advised.  Tana Conch, MD

## 2023-07-31 NOTE — Patient Instructions (Addendum)
Call Dr. Onalee Hua of [facility] if you don't hear within a week  Please stop by lab before you go If you have mychart- we will send your results within 3 business days of [place] receiving them.  If you do not have mychart- we will call you about results within 5 business days of [place] receiving them.  *please also note that you will see labs on mychart as soon as they post. I will later go in and write notes on them- will say "notes from Dr. Durene Cal"   Recommended follow up: Return in about 4 months (around 11/28/2023) for followup or sooner if needed.Schedule b4 you leave.

## 2023-07-31 NOTE — Addendum Note (Signed)
Addended by: Shelva Majestic on: 07/31/2023 05:05 PM   Modules accepted: Level of Service

## 2023-08-18 ENCOUNTER — Telehealth: Payer: Self-pay | Admitting: Family Medicine

## 2023-08-18 NOTE — Telephone Encounter (Signed)
 Awaiting faxed paperwork

## 2023-08-18 NOTE — Telephone Encounter (Unsigned)
Copied from CRM [id number]. Topic: Clinical - Home Health Verbal Orders >> Aug 18, 2023 11:15 AM Adele Barthel wrote: Caller/Agency: [employer], [employer] Callback Number: [phone] Service Requested: Sent chart note request form and was calling for update on status. Reviewed chart and unable to verify if received. Verified fax number with representative.   Patient is needing the form to state specifically that he is using the glucose monitor. The form also needs to include insulin details and the diagnosis and treatment plan for diabetes. Form will be re faxed to clinic today and company will call back on 02/21 for an update.  Frequency: N/a Any new concerns about the patient? No

## 2023-08-21 NOTE — Telephone Encounter (Signed)
Form and OV notes have been faxed.

## 2023-09-06 ENCOUNTER — Other Ambulatory Visit: Payer: Self-pay | Admitting: Family Medicine

## 2023-09-06 NOTE — Progress Notes (Unsigned)
 Cardiology Office Note:    Date:  09/06/2023   ID:  Matthew [name], DOB 04-19-1941, MRN [id number]  PCP:  Shelva Majestic, MD   [facility] Providers Cardiologist:  Tonny Bollman, MD     Referring MD: Shelva Majestic, MD   No chief complaint on file.   History of Present Illness:    Matthew [name] is a 83 y.o. male with a hx of:  CAD. S/p CABG x 4 1997: Sequential LIMA to LAD and D2, SVG to D1, sequential SVG to posterior descending and posterolateral branches.  Echo 01/21/2018: EF 55 to 60%.  Grade I DD. Nuclear stress test 02/22/2020: Normal, low risk study. PAD. S/p left femoropopliteal bypass 02/27/2020. S/p right femoropopliteal bypass 04/23/2020. Vascular ultrasound bilateral lower extremity bypass grafts 11/13/2021: Patent bypass grafts bilaterally. Hypertension. Hyperlipidemia. Lipid panel 10/01/2021: LDL 66, HDL 43, TG 53, total 119. T2DM. CKD stage III. GERD/PUD.   Current Medications: No outpatient medications have been marked as taking for the 09/07/23 encounter (Appointment) with Tonny Bollman, MD.     Allergies:   Penicillins, Probenecid, Tetracycline hcl, and Allopurinol   ROS:   Please see the history of present illness.    All other systems reviewed and are negative.  EKGs/Labs/Other Studies Reviewed:    The following studies were reviewed today: Cardiac Studies & Procedures   ______________________________________________________________________________________________   STRESS TESTS  MYOCARDIAL PERFUSION IMAGING 02/22/2020  Narrative  Nuclear stress EF: 73%.  There was no ST segment deviation noted during stress.  The study is normal.  This is a low risk study.  The left ventricular ejection fraction is hyperdynamic (>65%).   ECHOCARDIOGRAM  ECHOCARDIOGRAM COMPLETE 01/21/2018  Narrative *[facility]* [address] [name], [place]  [address] [phone]  ------------------------------------------------------------------- Transthoracic Echocardiography  Patient:    Matthew [name], Matthew [name] MR #:       [id number] Study Date: 01/21/2018 Gender:     M Age:        77 Height:     182.9 cm Weight:     88.2 kg BSA:        2.13 m^2 Pt. Status: Room:  ATTENDING    [name]     Tonny Bollman, MD REFERRING    Tonny Bollman, MD SONOGRAPHER  Aida Raider, RDCS REFERRING    Shelva Majestic PERFORMING   [facility], Outpatient  cc:  ------------------------------------------------------------------- LV EF: 55% -   60%  ------------------------------------------------------------------- Indications:      I25.10 Coronary artery disease.  ------------------------------------------------------------------- History:   PMH:  Acquired from the patient and from the patient&'s chart.  PMH:  Coronary artery disease.  Risk factors: Hypertension. Diabetes mellitus. Dyslipidemia.  ------------------------------------------------------------------- Study Conclusions  - Left ventricle: The cavity size was normal. Wall thickness was normal. Systolic function was normal. The estimated ejection fraction was in the range of 55% to 60%. Wall motion was normal; there were no regional wall motion abnormalities. Doppler parameters are consistent with abnormal left ventricular relaxation (grade 1 diastolic dysfunction).  Impressions:  - Normal LV systolic function; mild diastolic dysfunction.  ------------------------------------------------------------------- Labs, prior tests, procedures, and surgery: Coronary artery bypass grafting.  ------------------------------------------------------------------- Study data:   Study status:  Routine.  Procedure:  The patient reported no pain pre or post test. Transthoracic echocardiography for left ventricular function evaluation, for right ventricular function evaluation, and  for assessment of valvular function. Image quality was adequate.  Study completion:  There were no complications.  Transthoracic echocardiography.  M-mode, complete 2D, spectral Doppler, and color Doppler.  Birthdate: Patient birthdate: 1941-01-19.  Age:  Patient is 83 yr old.  Sex: Gender: male.    BMI: 26.4 kg/m^2.  Blood pressure:     140/60 Patient status:  Outpatient.  Study date:  Study date: 01/21/2018. Study time: 08:45 AM.  Location:  [facility]  -------------------------------------------------------------------  ------------------------------------------------------------------- Left ventricle:  The cavity size was normal. Wall thickness was normal. Systolic function was normal. The estimated ejection fraction was in the range of 55% to 60%. Wall motion was normal; there were no regional wall motion abnormalities. Doppler parameters are consistent with abnormal left ventricular relaxation (grade 1 diastolic dysfunction).  ------------------------------------------------------------------- Aortic valve:   Trileaflet; normal thickness leaflets. Mobility was not restricted.  Doppler:  Transvalvular velocity was within the normal range. There was no stenosis. There was no regurgitation.  ------------------------------------------------------------------- Aorta:  Aortic root: The aortic root was normal in size.  ------------------------------------------------------------------- Mitral valve:   Structurally normal valve.   Mobility was not restricted.  Doppler:  Transvalvular velocity was within the normal range. There was no evidence for stenosis. There was trivial regurgitation.  ------------------------------------------------------------------- Left atrium:  The atrium was normal in size.  ------------------------------------------------------------------- Right ventricle:  The cavity size was normal. Systolic function  was normal.  ------------------------------------------------------------------- Pulmonic valve:    Doppler:  Transvalvular velocity was within the normal range. There was no evidence for stenosis.  ------------------------------------------------------------------- Tricuspid valve:   Structurally normal valve.    Doppler: Transvalvular velocity was within the normal range. There was trivial regurgitation.  ------------------------------------------------------------------- Right atrium:  The atrium was normal in size.  ------------------------------------------------------------------- Pericardium:  There was no pericardial effusion.  ------------------------------------------------------------------- Measurements  Left ventricle                         Value        Reference LV ID, ED, PLAX chordal                43.7  mm     43 - 52 LV ID, ES, PLAX chordal                24.8  mm     23 - 38 LV fx shortening, PLAX chordal         43    %      >=29 LV PW thickness, ED                    9.99  mm     --------- IVS/LV PW ratio, ED                    1.06         <=1.3 Stroke volume, 2D                      60    ml     --------- Stroke volume/bsa, 2D                  28    ml/m^2 --------- LV e&', lateral                         8.33  cm/s   --------- LV E/e&', lateral                       7.59         ---------  LV e&', medial                          7.24  cm/s   --------- LV E/e&', medial                        8.73         --------- LV e&', average                         7.79  cm/s   --------- LV E/e&', average                       8.12         ---------  Ventricular septum                     Value        Reference IVS thickness, ED                      10.6  mm     ---------  LVOT                                   Value        Reference LVOT ID, S                             19    mm     --------- LVOT area                              2.84  cm^2   --------- LVOT ID                                 19    mm     --------- LVOT peak velocity, S                  91    cm/s   --------- LVOT mean velocity, S                  65.2  cm/s   --------- LVOT VTI, S                            21    cm     --------- LVOT peak gradient, S                  3     mm Hg  --------- Stroke volume (SV), LVOT DP            59.5  ml     --------- Stroke index (SV/bsa), LVOT DP         28    ml/m^2 ---------  Aorta                                  Value        Reference Aortic root ID, ED  33    mm     --------- Ascending aorta ID, A-P, S             35    mm     ---------  Left atrium                            Value        Reference LA ID, A-P, ES                         44    mm     --------- LA ID/bsa, A-P                         2.07  cm/m^2 <=2.2 LA volume, S                           39    ml     --------- LA volume/bsa, S                       18.3  ml/m^2 --------- LA volume, ES, 1-p A4C                 34    ml     --------- LA volume/bsa, ES, 1-p A4C             16    ml/m^2 --------- LA volume, ES, 1-p A2C                 44    ml     --------- LA volume/bsa, ES, 1-p A2C             20.7  ml/m^2 ---------  Mitral valve                           Value        Reference Mitral E-wave peak velocity            63.2  cm/s   --------- Mitral A-wave peak velocity            82.9  cm/s   --------- Mitral deceleration time       (H)     292   ms     150 - 230 Mitral E/A ratio, peak                 0.8          ---------  Tricuspid valve                        Value        Reference Tricuspid regurg peak velocity         250   cm/s   --------- Tricuspid peak RV-RA gradient          25    mm Hg  ---------  Right ventricle                        Value        Reference RV s&', lateral, S                      10.3  cm/s   ---------  Legend: (L)  and  (H)  mark values outside specified reference  range.  ------------------------------------------------------------------- Prepared and Electronically Authenticated by  [name] 2019-07-25T15:12:42          ______________________________________________________________________________________________      EKG:        Recent Labs: 07/31/2023: ALT 11; BUN 36; Creatinine, Ser 1.77; Hemoglobin 14.1; Platelets 188.0; Potassium 4.8; Sodium 139  Recent Lipid Panel    Component Value Date/Time   CHOL 116 09/19/2022 1126   TRIG 74.0 09/19/2022 1126   HDL 42.90 09/19/2022 1126   CHOLHDL 3 09/19/2022 1126   VLDL 14.8 09/19/2022 1126   LDLCALC 59 09/19/2022 1126   LDLDIRECT 69.0 03/11/2021 1116     Risk Assessment/Calculations:      No BP recorded.  {Refresh Note OR Click here to enter BP  :1}***         Physical Exam:    VS:  There were no vitals taken for this visit.    Wt Readings from Last 3 Encounters:  07/31/23 205 lb 9.6 oz (93.3 kg)  02/26/23 203 lb 9.6 oz (92.4 kg)  01/22/23 207 lb (93.9 kg)     GEN: *** Well nourished, well developed in no acute distress HEENT: Normal NECK: No JVD; No carotid bruits LYMPHATICS: No lymphadenopathy CARDIAC: ***RRR, no murmurs, rubs, gallops RESPIRATORY:  Clear to auscultation without rales, wheezing or rhonchi  ABDOMEN: Soft, non-tender, non-distended MUSCULOSKELETAL:  No edema; No deformity  SKIN: Warm and dry NEUROLOGIC:  Alert and oriented x 3 PSYCHIATRIC:  Normal affect   Assessment & Plan PAD (peripheral artery disease) (HCC)  Coronary artery disease involving native coronary artery of native heart without angina pectoris  Essential hypertension Creatinine recently checked is 1.77 which is stable from previous readings (1.831-year ago) Hyperlipidemia LDL goal <70 Lipids 2024 show a cholesterol 116, HDL 43, LDL 59.  ALT normal at 11.       {Are you ordering a CV Procedure (e.g. stress test, cath, DCCV, TEE, etc)?   Press F2        :[id number]}     Medication Adjustments/Labs and Tests Ordered: Current medicines are reviewed at length with the patient today.  Concerns regarding medicines are outlined above.  No orders of the defined types were placed in this encounter.  No orders of the defined types were placed in this encounter.   There are no Patient Instructions on file for this visit.   Signed, Tonny Bollman, MD  09/06/2023 6:10 PM    [facility]

## 2023-09-06 NOTE — Assessment & Plan Note (Signed)
 Creatinine recently checked is 1.77 which is stable from previous readings (1.831-year ago).  Blood pressure well-controlled on multidrug therapy with chlorthalidone, lisinopril, metoprolol succinate, and nifedipine.

## 2023-09-07 ENCOUNTER — Ambulatory Visit: Payer: Medicare Other | Attending: Cardiovascular Disease | Admitting: Cardiovascular Disease

## 2023-09-07 ENCOUNTER — Encounter: Payer: Self-pay | Admitting: Cardiovascular Disease

## 2023-09-07 VITALS — BP 120/60 | HR 57 | Ht 72.0 in | Wt 209.8 lb

## 2023-09-07 DIAGNOSIS — I10 Essential (primary) hypertension: Secondary | ICD-10-CM | POA: Diagnosis not present

## 2023-09-07 DIAGNOSIS — I739 Peripheral vascular disease, unspecified: Secondary | ICD-10-CM | POA: Diagnosis not present

## 2023-09-07 DIAGNOSIS — I2510 Atherosclerotic heart disease of native coronary artery without angina pectoris: Secondary | ICD-10-CM | POA: Insufficient documentation

## 2023-09-07 DIAGNOSIS — E785 Hyperlipidemia, unspecified: Secondary | ICD-10-CM | POA: Diagnosis present

## 2023-09-07 NOTE — Assessment & Plan Note (Signed)
 The patient has strong pulses in both feet.  He is having no claudication symptoms.  He is quite limited by his neuropathic symptoms.  He will continue to follow-up with vascular surgery.  The patient has undergone bilateral femoral-popliteal bypass.

## 2023-09-07 NOTE — Patient Instructions (Signed)
 Follow-Up: At [facility], you and your health needs are our priority.  As part of our continuing mission to provide you with exceptional heart care, we have created designated Provider Care Teams.  These Care Teams include your primary Cardiologist (physician) and Advanced Practice Providers (APPs -  Physician Assistants and Nurse Practitioners) who all work together to provide you with the care you need, when you need it.  Your next appointment:   1 year(s)  Provider:   Tonny Bollman, MD        1st Floor: - Lobby - Registration  - Pharmacy  - Lab - Cafe  2nd Floor: - PV Lab - Diagnostic Testing (echo, CT, nuclear med)  3rd Floor: - Vacant  4th Floor: - TCTS (cardiothoracic surgery) - AFib Clinic - Structural Heart Clinic - Vascular Surgery  - Vascular Ultrasound  5th Floor: - HeartCare Cardiology (general and EP) - Clinical Pharmacy for coumadin, hypertension, lipid, weight-loss medications, and med management appointments    Valet parking services will be available as well.

## 2023-09-07 NOTE — Assessment & Plan Note (Signed)
 Patient about 28 years out from multivessel CABG with no angina.  He continues on aspirin and a high intensity statin drug.  He continues on an ACE inhibitor in the setting of diabetes.  Overall doing well.

## 2023-09-18 DIAGNOSIS — R609 Edema, unspecified: Secondary | ICD-10-CM | POA: Diagnosis not present

## 2023-09-18 DIAGNOSIS — N1831 Chronic kidney disease, stage 3a: Secondary | ICD-10-CM | POA: Diagnosis not present

## 2023-09-18 DIAGNOSIS — I129 Hypertensive chronic kidney disease with stage 1 through stage 4 chronic kidney disease, or unspecified chronic kidney disease: Secondary | ICD-10-CM | POA: Diagnosis not present

## 2023-09-18 DIAGNOSIS — E559 Vitamin D deficiency, unspecified: Secondary | ICD-10-CM | POA: Diagnosis not present

## 2023-09-18 DIAGNOSIS — E1122 Type 2 diabetes mellitus with diabetic chronic kidney disease: Secondary | ICD-10-CM | POA: Diagnosis not present

## 2023-09-21 DIAGNOSIS — H0102A Squamous blepharitis right eye, upper and lower eyelids: Secondary | ICD-10-CM | POA: Diagnosis not present

## 2023-09-21 DIAGNOSIS — H43823 Vitreomacular adhesion, bilateral: Secondary | ICD-10-CM | POA: Diagnosis not present

## 2023-09-21 DIAGNOSIS — Z961 Presence of intraocular lens: Secondary | ICD-10-CM | POA: Diagnosis not present

## 2023-09-21 DIAGNOSIS — E119 Type 2 diabetes mellitus without complications: Secondary | ICD-10-CM | POA: Diagnosis not present

## 2023-09-21 DIAGNOSIS — H0102B Squamous blepharitis left eye, upper and lower eyelids: Secondary | ICD-10-CM | POA: Diagnosis not present

## 2023-09-21 DIAGNOSIS — Z794 Long term (current) use of insulin: Secondary | ICD-10-CM | POA: Diagnosis not present

## 2023-09-24 ENCOUNTER — Other Ambulatory Visit: Payer: Self-pay | Admitting: Family Medicine

## 2023-09-29 DIAGNOSIS — H35372 Puckering of macula, left eye: Secondary | ICD-10-CM | POA: Diagnosis not present

## 2023-09-29 DIAGNOSIS — H34832 Tributary (branch) retinal vein occlusion, left eye, with macular edema: Secondary | ICD-10-CM | POA: Diagnosis not present

## 2023-09-29 DIAGNOSIS — E113291 Type 2 diabetes mellitus with mild nonproliferative diabetic retinopathy without macular edema, right eye: Secondary | ICD-10-CM | POA: Diagnosis not present

## 2023-09-29 DIAGNOSIS — H35341 Macular cyst, hole, or pseudohole, right eye: Secondary | ICD-10-CM | POA: Diagnosis not present

## 2023-09-29 DIAGNOSIS — E113212 Type 2 diabetes mellitus with mild nonproliferative diabetic retinopathy with macular edema, left eye: Secondary | ICD-10-CM | POA: Diagnosis not present

## 2023-09-29 DIAGNOSIS — H35352 Cystoid macular degeneration, left eye: Secondary | ICD-10-CM | POA: Diagnosis not present

## 2023-09-29 LAB — HM DIABETES EYE EXAM

## 2023-10-01 DIAGNOSIS — Z23 Encounter for immunization: Secondary | ICD-10-CM | POA: Diagnosis not present

## 2023-10-06 DIAGNOSIS — S6991XA Unspecified injury of right wrist, hand and finger(s), initial encounter: Secondary | ICD-10-CM | POA: Diagnosis not present

## 2023-10-16 DIAGNOSIS — S62666A Nondisplaced fracture of distal phalanx of right little finger, initial encounter for closed fracture: Secondary | ICD-10-CM | POA: Diagnosis not present

## 2023-10-16 DIAGNOSIS — M7989 Other specified soft tissue disorders: Secondary | ICD-10-CM | POA: Diagnosis not present

## 2023-10-16 DIAGNOSIS — M10341 Gout due to renal impairment, right hand: Secondary | ICD-10-CM | POA: Diagnosis not present

## 2023-10-16 DIAGNOSIS — M79641 Pain in right hand: Secondary | ICD-10-CM | POA: Diagnosis not present

## 2023-10-22 ENCOUNTER — Encounter: Payer: Self-pay | Admitting: Family

## 2023-10-22 ENCOUNTER — Telehealth: Payer: Self-pay

## 2023-10-22 ENCOUNTER — Ambulatory Visit (INDEPENDENT_AMBULATORY_CARE_PROVIDER_SITE_OTHER): Admitting: Family

## 2023-10-22 VITALS — BP 153/72 | HR 57 | Temp 97.9°F | Ht 72.0 in | Wt 206.0 lb

## 2023-10-22 DIAGNOSIS — L03113 Cellulitis of right upper limb: Secondary | ICD-10-CM

## 2023-10-22 DIAGNOSIS — S62654A Nondisplaced fracture of medial phalanx of right ring finger, initial encounter for closed fracture: Secondary | ICD-10-CM

## 2023-10-22 DIAGNOSIS — M1A9XX1 Chronic gout, unspecified, with tophus (tophi): Secondary | ICD-10-CM | POA: Diagnosis not present

## 2023-10-22 DIAGNOSIS — S62666A Nondisplaced fracture of distal phalanx of right little finger, initial encounter for closed fracture: Secondary | ICD-10-CM

## 2023-10-22 MED ORDER — COLCHICINE 0.6 MG PO TABS: 0.6000 mg | ORAL_TABLET | Freq: Every day | ORAL | 0 refills | Status: AC | PRN

## 2023-10-22 MED ORDER — CEPHALEXIN 500 MG PO CAPS: 500.0000 mg | ORAL_CAPSULE | Freq: Three times a day (TID) | ORAL | 0 refills | Status: AC

## 2023-10-22 NOTE — Progress Notes (Signed)
 Patient ID: Matthew [name], male    DOB: [birth year], 83 y.o.   MRN: [id number]  Chief Complaint  Patient presents with   Fall    Pt had on fall on April 4th, Pt tried to break fall and landed on his right hand. Pt saw sports medicine on Friday, has tried prednisone .   HPI: Right swelling and pain:    Pt is present today with his wife who is helping with medical hx. He had on fall on April 4th, Pt tried to break the fall and landed on his right hand. Pt saw [facility] 4 days later on 4/8 and xrays were done but he was told due to swelling they were unable to determine if there was a fracture in his pinky, but there was a fx found in his 4th finger middle joint, he was given a splint to wear, given Clindamycin  to prevent cellulitis in right palm due to a laceration from the fall that was showing signs of infection, and advised to f/u with [facility] office, which he did on 4/18, new xrays indicate possible fx in the 5th pinky, labs done for ESR, CRP and uric acid, all elevated, pt told he also had gout in the right hand, given a slow Prednisone  taper for 15 days. Due to discomfort with using the splint he was advised to stop using and to use a shoulder sling instead to keep hand from hanging dependently and advised to keep hand higher than heart as much as possible to reduce swelling. He also reports having an esophagitis reaction to the Clindamycin  and stopped taking after 1-2 days.  The swelling has improved in his right hand, but he is unable to bend his fingers. Pt and wife are concerned since about the healing of his fractures since it has been 20 days. They have an appointment with the [facility] office again next Friday, but unsure if this is with a hand specialist. They are requesting we try to send a referral to a hand specialist.  Assessment & Plan  Right hand swelling/pain due to fracture of right 4th & 5th fingers - Significant swelling and erythema noted in right hand and  all 5 fingers, hand and fingers tender to palpation. Right index finger, distal PIP, ventral side with pinpoint white pus noted under the skin which appears as tophi. Pt reports having in past with gout in his left hand/finger. - Advised to continue the prednisone  taper as directed - Continue to elevate hand above heart as much as possible - Apply ice for up to 3x/day, using a soft cloth between ice and hand. - Sending Cephalexin  500mg  tid for 7days for probable cellulitis vs gout - Sending urgent referral to [facility] hand specialists Ortmann or [facility]  Gout - Right hand, notable 2-3+ edema in whole hand and all 5 fingers; mild erythema & warmth noted in palm and ventral finger. Elevated uric acid on 4/18. Taking Prednisone  taper. - Advised to continue the prednisone  taper as directed - Continue to elevate hand above heart as much as possible - Sending refill of Colchicine  0.6mg , 1 tab daily for 4 days - Avoid purine rich foods: red meat, organ meat, shellfish, alcohol, eat just one serving of non red meat daily. Can eat cherries, drink small amount of cherry juice or eating yogurt can help lower uric acid. - Advised to follow up if not resolving in another week    Subjective:    Outpatient Medications Prior  to Visit  Medication Sig Dispense Refill   aspirin  EC 81 MG tablet Take 81 mg by mouth daily. Swallow whole.     atorvastatin  (LIPITOR) 40 MG tablet Take 1 tablet by mouth once daily 90 tablet 0   Azelastine  HCl 137 MCG/SPRAY SOLN USE 1 SPRAY(S) IN EACH NOSTRIL TWICE DAILY AS DIRECTED 30 mL 0   chlorthalidone  (HYGROTON ) 25 MG tablet Take 1 tablet by mouth once daily 90 tablet 0   cholecalciferol  (VITAMIN D3) 25 MCG (1000 UNIT) tablet Take 1,000 Units by mouth daily.     colchicine  0.6 MG tablet Take 1 tablet by mouth once daily 30 tablet 0   Continuous Glucose Receiver (FREESTYLE LIBRE 14 DAY READER) DEVI 2 each by Does not apply route daily. 2 each 11   Continuous  Glucose Sensor (FREESTYLE LIBRE 14 DAY SENSOR) MISC 2 each by Does not apply route daily. 2 each 11   cyclobenzaprine  (FLEXERIL ) 5 MG tablet TAKE ONE TABLET BY MOUTH TWICE DAILY AS NEEDED FOR MUSCLE SPASM 30 tablet 0   febuxostat  (ULORIC ) 40 MG tablet Take 1 tablet (40 mg total) by mouth daily. 30 tablet 2   fexofenadine (ALLEGRA) 180 MG tablet Take 180 mg by mouth daily.     fluticasone  (FLONASE ) 50 MCG/ACT nasal spray USE 2 SPRAY(S) IN EACH NOSTRIL ONCE DAILY AS NEEDED 16 g 0   insulin  NPH-regular Human (NOVOLIN  70/30 RELION) (70-30) 100 UNIT/ML injection INJECT 22 UNITS INTO THE SKIN IN THE MORNING AND 20 UNITS IN THE EVENING 10 mL 0   lisinopril  (ZESTRIL ) 10 MG tablet Take 1 tablet (10 mg total) by mouth daily. 90 tablet 3   metoprolol  succinate (TOPROL -XL) 100 MG 24 hr tablet TAKE 1 TABLET EVERY DAY WITH OR IMMMEDIATELY FOLLOWING A MEAL 90 tablet 3   NIFEdipine  (PROCARDIA -XL) 30 MG (OSM) 24 hr tablet Take 30 mg by mouth 2 (two) times daily.     oxymetazoline  (AFRIN) 0.05 % nasal spray Place 1 spray into both nostrils 2 (two) times daily as needed for congestion.     pioglitazone  (ACTOS ) 30 MG tablet Take 1 tablet (30 mg total) by mouth daily. 90 tablet 3   predniSONE  (DELTASONE ) 5 MG tablet Take 6 tablets (30mg ) once daily x 3 days, then take 4 tablets (20mg ) once daily x 3 days, then take 3 tablets (15mg ) once daily x 3 days, then take 2 tablets (10mg ) once daily x 3 days, then take 1 tablet (5mg ) once daily x 3 days     No facility-administered medications prior to visit.   Past Medical History:  Diagnosis Date   Cataract, nuclear sclerotic, right eye 11/20/2020   CLAUDICATION 05/14/2007   CORONARY ARTERY DISEASE 12/28/2006   Myoview  8/21: EF 73, normal perfusion, low risk    Cystoid macular edema of left eye 03/20/2021   Diabetes mellitus type II, controlled (HCC) 12/28/2006   Actos  30mg , insulin  70/30 20 units BID, amaryl  1mg  previously a1c <8. Worsened due to cold over 5 weeks around  05/2014 eating poorly and eating sugary syrups.   Stomach irritation on metformin.  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   Vitrectomy 11/21/2021   Past Surgical History:  Procedure Laterality Date   ABDOMINAL AORTOGRAM W/LOWER EXTREMITY N/A 02/10/2020   Procedure: ABDOMINAL AORTOGRAM W/LOWER EXTREMITY;  Surgeon: Richrd Char, MD;  Location: [facility];  Service: Cardiovascular;  Laterality: N/A;   CATARACT EXTRACTION Left 06/21/2020   CATARACT EXTRACTION Right    COLONOSCOPY     CORONARY ARTERY BYPASS GRAFT     ENDARTERECTOMY FEMORAL Left 02/27/2020   Procedure: LEFT COMMON FEMORAL ENDARTERECTOMY;  Surgeon: Richrd Char, MD;  Location: [facility] OR;  Service: Vascular;  Laterality: Left;   FEMORAL-POPLITEAL BYPASS GRAFT Left 02/27/2020   Procedure: LEFT FEMORAL-ABOVE KNEE POPLITEAL BYPASS WITH NON REVERSED GREATER SAPHENOUS VEIN;  Surgeon: Richrd Char, MD;  Location: [facility] OR;  Service: Vascular;  Laterality: Left;   FEMORAL-POPLITEAL BYPASS GRAFT Right 04/23/2020   Procedure: BYPASS GRAFT FEMORAL- Above Knee POPLITEAL ARTERY RIGHT Using Right Greater Saphenous Vein;  Surgeon: Richrd Char, MD;  Location: [facility] OR;  Service: Vascular;  Laterality: Right;   KNEE ARTHROSCOPY  2012   UPPER GASTROINTESTINAL ENDOSCOPY     Allergies  Allergen Reactions   Penicillins Hives   Probenecid      Developed  lump on chest- went away when he stopped the medicine   Tetracycline Hcl Hives   Allopurinol  Itching      Objective:    Physical Exam Vitals and nursing note reviewed.  Constitutional:      General: He is not in acute distress.    Appearance: Normal appearance.  HENT:     Head: Normocephalic.  Cardiovascular:     Rate and Rhythm: Normal rate and regular rhythm.  Pulmonary:     Effort: Pulmonary effort is normal.     Breath sounds: Normal breath sounds.  Musculoskeletal:     Right hand: Swelling (2-3+ in whole hand and all 5 fingers; mild erythema & warmth noted in palm and ventral fingers), tenderness and bony tenderness present. Decreased range of motion. Decreased strength of finger abduction, thumb/finger opposition and wrist extension.     Cervical back: Normal range of motion.  Skin:    General: Skin is warm and dry.  Neurological:     Mental Status: He is alert and oriented to person, place, and time.  Psychiatric:        Mood and Affect: Mood normal.    BP (!) 153/72 (BP Location: Left Arm, Patient Position: Sitting, Cuff Size: Large)   Pulse (!) 57   Temp 97.9 F (36.6 C) (Temporal)   Ht 6' (1.829 m)   Wt 206 lb (93.4 kg)   SpO2 96%   BMI 27.94 kg/m  Wt Readings from Last 3 Encounters:  10/22/23 206 lb (93.4 kg)  09/07/23 209 lb 12.8 oz (95.2 kg)  07/31/23 205 lb 9.6 oz (93.3 kg)       Versa Gore, NP

## 2023-10-22 NOTE — Telephone Encounter (Signed)
 I called pt. Pt verbalized understanding to recommendations, no questions or concerns at this time.

## 2023-10-22 NOTE — Telephone Encounter (Signed)
-----   Message from [place] sent at 10/22/2023  2:21 PM EDT ----- Regarding: Call patient Call Joe and let him know I sent a different antibiotic (Keflex ) because the Doxycycline  I was going to send is in the same family as Tetracycline that he is allergic to and not advised to give.   Also regarding his gout a reminder to Avoid purine rich foods: red meat, organ meat, shellfish, alcohol, eat just one serving of non red meat daily. Can eat cherries, drink small amount of cherry juice or eating yogurt can help lower uric acid.  Our referral nurse should be calling today or tomorrow morning about the Orthopedic/Hand specialist referral.   Thanks

## 2023-11-03 ENCOUNTER — Telehealth: Payer: Self-pay | Admitting: Family Medicine

## 2023-11-03 DIAGNOSIS — M79641 Pain in right hand: Secondary | ICD-10-CM | POA: Diagnosis not present

## 2023-11-03 NOTE — Telephone Encounter (Unsigned)
 Copied from CRM [id number]. Topic: General - Other >> Oct 29, 2023  9:17 AM Jenice Mitts wrote: Reason for CRM: Heidi from from [employer] checking to see if we received a fax >> Nov 03, 2023 11:08 AM Eleanore Grey wrote: Michel Agreste with [employer] is calling to follow up on fax and see if it was received, for patient's continuous glucose monitor. Unable to confirm if it was received, confirmed fax number with her and asked her to refax form. Her call back number is [phone]

## 2023-11-03 NOTE — Telephone Encounter (Signed)
 Forms completed and faxed back.

## 2023-11-09 DIAGNOSIS — M25641 Stiffness of right hand, not elsewhere classified: Secondary | ICD-10-CM | POA: Diagnosis not present

## 2023-11-09 DIAGNOSIS — M79641 Pain in right hand: Secondary | ICD-10-CM | POA: Diagnosis not present

## 2023-11-12 DIAGNOSIS — M25641 Stiffness of right hand, not elsewhere classified: Secondary | ICD-10-CM | POA: Diagnosis not present

## 2023-11-19 DIAGNOSIS — M25641 Stiffness of right hand, not elsewhere classified: Secondary | ICD-10-CM | POA: Diagnosis not present

## 2023-11-24 DIAGNOSIS — M25641 Stiffness of right hand, not elsewhere classified: Secondary | ICD-10-CM | POA: Diagnosis not present

## 2023-11-27 ENCOUNTER — Other Ambulatory Visit: Payer: Self-pay | Admitting: Family Medicine

## 2023-11-27 DIAGNOSIS — M25641 Stiffness of right hand, not elsewhere classified: Secondary | ICD-10-CM | POA: Diagnosis not present

## 2023-12-03 ENCOUNTER — Encounter: Payer: Self-pay | Admitting: Family Medicine

## 2023-12-03 ENCOUNTER — Ambulatory Visit (INDEPENDENT_AMBULATORY_CARE_PROVIDER_SITE_OTHER): Payer: Medicare Other | Admitting: Family Medicine

## 2023-12-03 VITALS — BP 130/60 | HR 80 | Temp 97.5°F | Ht 72.0 in | Wt 206.6 lb

## 2023-12-03 DIAGNOSIS — Z794 Long term (current) use of insulin: Secondary | ICD-10-CM

## 2023-12-03 DIAGNOSIS — E11319 Type 2 diabetes mellitus with unspecified diabetic retinopathy without macular edema: Secondary | ICD-10-CM | POA: Diagnosis not present

## 2023-12-03 DIAGNOSIS — E1169 Type 2 diabetes mellitus with other specified complication: Secondary | ICD-10-CM

## 2023-12-03 DIAGNOSIS — I2510 Atherosclerotic heart disease of native coronary artery without angina pectoris: Secondary | ICD-10-CM

## 2023-12-03 DIAGNOSIS — E785 Hyperlipidemia, unspecified: Secondary | ICD-10-CM

## 2023-12-03 DIAGNOSIS — N183 Chronic kidney disease, stage 3 unspecified: Secondary | ICD-10-CM | POA: Diagnosis not present

## 2023-12-03 DIAGNOSIS — I10 Essential (primary) hypertension: Secondary | ICD-10-CM | POA: Diagnosis not present

## 2023-12-03 LAB — CBC WITH DIFFERENTIAL/PLATELET
Basophils Absolute: 0 10*3/uL (ref 0.0–0.1)
Basophils Relative: 0.4 % (ref 0.0–3.0)
Eosinophils Absolute: 0.3 10*3/uL (ref 0.0–0.7)
Eosinophils Relative: 6.1 % — ABNORMAL HIGH (ref 0.0–5.0)
HCT: 40 % (ref 39.0–52.0)
Hemoglobin: 12.9 g/dL — ABNORMAL LOW (ref 13.0–17.0)
Lymphocytes Relative: 20.7 % (ref 12.0–46.0)
Lymphs Abs: 0.9 10*3/uL (ref 0.7–4.0)
MCHC: 32.3 g/dL (ref 30.0–36.0)
MCV: 88 fl (ref 78.0–100.0)
Monocytes Absolute: 0.4 10*3/uL (ref 0.1–1.0)
Monocytes Relative: 8.8 % (ref 3.0–12.0)
Neutro Abs: 2.7 10*3/uL (ref 1.4–7.7)
Neutrophils Relative %: 64 % (ref 43.0–77.0)
Platelets: 161 10*3/uL (ref 150.0–400.0)
RBC: 4.55 Mil/uL (ref 4.22–5.81)
RDW: 14.4 % (ref 11.5–15.5)
WBC: 4.3 10*3/uL (ref 4.0–10.5)

## 2023-12-03 LAB — HEMOGLOBIN A1C: Hgb A1c MFr Bld: 8 % — ABNORMAL HIGH (ref 4.6–6.5)

## 2023-12-03 MED ORDER — CHLORTHALIDONE 25 MG PO TABS: 12.5000 mg | ORAL_TABLET | Freq: Every day | ORAL | 3 refills | Status: DC

## 2023-12-03 MED ORDER — LOSARTAN POTASSIUM 50 MG PO TABS
50.0000 mg | ORAL_TABLET | Freq: Every day | ORAL | 3 refills | Status: AC
Start: 2023-12-03 — End: ?

## 2023-12-03 NOTE — Patient Instructions (Addendum)
 blood pressure well controlled - with recent gout flare though we wanted to reduce chlorthalidone  to 12.5 mg  (as it increases risk of gout) and STOP lisinopril  and change to losartan 50 mg (blood pressure medicine which lowers risk of gout)  Please stop by lab before you go If you have mychart- we will send your results within 3 business days of us  receiving them.  If you do not have mychart- we will call you about results within 5 business days of us  receiving them.  *please also note that you will see labs on mychart as soon as they post. I will later go in and write notes on them- will say "notes from Dr. Arlene Ben"   Will add uric acid next visit after adjustments  Recommended follow up: Return in about 4 months (around 04/03/2024) for followup or sooner if needed.Schedule b4 you leave.

## 2023-12-03 NOTE — Progress Notes (Signed)
 Phone [phone] In person visit   Subjective:   Matthew [name] is a 83 y.o. 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) 12/28/2006    Priority: High    Coronary artery disease s/p CABG 1997 12/28/2006    Priority: High   Diabetes mellitus type II, controlled (HCC) 12/28/2006    Priority: High   Aortic atherosclerosis (HCC) 01/31/2022    Priority: Medium    Cervical arthritis (HCC) 10/25/2014    Priority: Medium    Erectile dysfunction 08/13/2010    Priority: Medium    CKD (chronic kidney disease), stage III (HCC) 09/15/2008    Priority: Medium    Hyperlipidemia associated with type 2 diabetes mellitus (HCC) 12/28/2006    Priority: Medium    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    Priority: Medium    Essential hypertension 12/28/2006    Priority: Medium    Allergic rhinitis 11/18/2016    Priority: Low   Pulmonary nodule seen on imaging study 05/17/2012    Priority: Low   Both eyes affected by mild nonproliferative diabetic retinopathy with macular edema, associated with type 2 diabetes mellitus (HCC) 09/19/2022   Full thickness macular hole, right 11/13/2021   Cystoid macular edema of left eye 03/20/2021   Type 2 macular telangiectasis, 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 Swallow whole.     atorvastatin  (LIPITOR) 40 MG tablet Take 1 tablet by mouth once daily 90 tablet  0   Azelastine  HCl 137 MCG/SPRAY SOLN USE 1 SPRAY(S) IN EACH NOSTRIL TWICE DAILY AS DIRECTED 30 mL 0   chlorthalidone  (HYGROTON ) 25 MG tablet Take 0.5 tablets (12.5 mg total) by mouth daily. 45 tablet 3   cholecalciferol  (VITAMIN D3) 25 MCG (1000 UNIT) tablet Take 1,000 Units by mouth daily.     colchicine  0.6 MG tablet Take 1 tablet (0.6 mg total) by mouth daily as needed (For gout pain in right hand. Take for 4 days only per episode.). 30 tablet 0   Continuous Glucose Receiver (FREESTYLE LIBRE 14 DAY READER) DEVI 2 each by Does not apply route daily. 2 each 11   Continuous Glucose Sensor (FREESTYLE LIBRE 14 DAY SENSOR) MISC 2 each by Does not apply route daily. 2 each 11   cyclobenzaprine  (FLEXERIL ) 5 MG tablet TAKE ONE TABLET BY MOUTH TWICE DAILY AS NEEDED FOR MUSCLE SPASM 30 tablet 0   fexofenadine (ALLEGRA) 180 MG tablet Take 180 mg by mouth daily.     fluticasone  (FLONASE ) 50 MCG/ACT nasal spray USE 2 SPRAY(S) IN EACH NOSTRIL ONCE DAILY AS NEEDED 16 g 0   insulin  NPH-regular Human (NOVOLIN  70/30 RELION) (70-30) 100 UNIT/ML injection INJECT 22 UNITS INTO THE SKIN IN THE MORNING AND 20 UNITS IN THE EVENING 10 mL 0   losartan (COZAAR) 50 MG tablet Take 1 tablet (50 mg total) by mouth daily. 90 tablet 3   metoprolol  succinate (TOPROL -XL)  100 MG 24 hr tablet TAKE 1 TABLET EVERY DAY WITH OR IMMMEDIATELY FOLLOWING A MEAL 90 tablet 3   NIFEdipine  (PROCARDIA -XL) 30 MG (OSM) 24 hr tablet Take 30 mg by mouth 2 (two) times daily.     oxymetazoline  (AFRIN) 0.05 % nasal spray Place 1 spray into both nostrils 2 (two) times daily as needed for congestion.     pioglitazone  (ACTOS ) 30 MG tablet Take 1 tablet (30 mg total) by mouth daily. 90 tablet 3   No current facility-administered medications for this visit.     Objective:  BP 130/60   Pulse 80   Temp (!) 97.5 F (36.4 C)   Ht 6' (1.829 m)   Wt 206 lb 9.6 oz (93.7 kg)   SpO2 96%   BMI 28.02 kg/m  Gen: NAD, resting comfortably CV: RRR no murmurs  rubs or gallops Lungs: CTAB no crackles, wheeze, rhonchi Ext: trace edema Skin: warm, dry Tender along proximal interphalangeal jointsparticurly 3rd and 4th fingers on right hand and edema noted with limited range of motion      Assessment and Plan   #social update- wife with multiple myeloma diagnosed late 2021-great results most recently  #right hand pain- fall with possible fracture vs cellulitis later vs. Gout flare- has seen [facility] as well as our office as well as Dr. Annamae Barrett with [facility] -basically they think the fall triggered gout flare- not clear on fracture -in physical therapy right now and gradually improving - swelling is improving - was started on colchicine  and still taking uloric  40 mg- breakthrough on trauma Lab Results  Component Value Date   LABURIC 5.3 07/31/2023   #some scaling on neck- thinks fungal and tried antifungal cream with little relief. Discussed trying twice a day and covering with Vaseline - plans to see dermatology if not doing better  # PAD-follows with vascular surgery- with right femoral to above knee popliteal bypass Dr. Nolene Baumgarten October 2021 and left femoral endarteretomy and left femoral to above knee popliteal artery bypass in August 2021 # CAD-follows with cardiology Dr. Arlester Ladd #hyperlipidemia S: Medication:Aspirin  81 mg, atorvastatin  40 mg Symptoms: no chest pain or shortness of breath  . Perhaps mild leg achiness with walking- possible mild claudication Lab Results  Component Value Date   CHOL 116 09/19/2022   HDL 42.90 09/19/2022   LDLCALC 59 09/19/2022   LDLDIRECT 69.0 03/11/2021   TRIG 74.0 09/19/2022   CHOLHDL 3 09/19/2022  A/P: CAD-asymptomatic continue current medications  Peripheral arterial disease- minimal symptoms- continue to monitor and continue current medications statin and aspirin    Hyperlipidemia-at goal last year- update today   # Diabetes-tolerate A1c up to 8.5 to avoid lows S:  Medication:Actos  30 mg daily, Novolin  70/30 with 22 units in the morning and  20 units in the evening CBGs- uses freestyle libre. Thankfully #s looking pretty good lately. Mornings usually 125. Lowest was 80 lately -Patient requires use of CONTINUOUS GLUCOSE MONITOR to monitor blood sugars. History of lows at 09/19/22 and meter helpful Lab Results  Component Value Date   HGBA1C 7.0 (H) 07/31/2023   HGBA1C 7.4 (H) 01/22/2023   HGBA1C 7.2 (H) 09/19/2022   A/P: hopefully stable- update a1c today. Continue current meds for now - could be slightly higher with recent prednisone   I discussed the microalbumin to creatinine lab error with patient that occurred with [facility].  Essentially the ratio was off by a factor of 10.  In this patient's individual case 2 years ago his #  was 280 and has improved to 68 after corrections- the good diabetes control and taking lisinopril  seems to help- we are going to change to losartan but increase dose to see if keeps things stable or further improves #   #hypertension # CKD stage III-GFR typically low 30s- 16s - sees [facility] Dr. Edson Graces S: medication: Lisinopril  10 mg, chlorthalidone  25 mg, Procardia  30 mg twice daily, metoprolol  100 mg extended release -Lisinopril  stopped in the past by Dr. Nolene Baumgarten for unclear reasons-we restarted 5 mg in 2023 and later went to 10 mg BP Readings from Last 3 Encounters:  12/03/23 130/60  10/22/23 (!) 153/72  09/07/23 120/60  A/P: blood pressure well controlled - with recent gout flare though we wanted to reduce chlorthalidone  to 12.5 mg  (as it increases risk of gout) and STOP lisinopril  and change to losartan 50 mg (blood pressure medicine which lowers risk of gout)  #prior skin lesion- trying to get into dermatology - may try [facility] to see if can be seen sooner- see last note  Recommended follow up: Return in about 4 months (around 04/03/2024) for followup or sooner if needed.Schedule b4 you  leave. Future Appointments  Date Time Provider Department Center  12/23/2023  9:30 AM [facility]  12/23/2023 10:30 AM [facility]  12/23/2023 11:15 AM [facility]  03/08/2024 10:00 AM Dellar Fenton, DO [facility] None    Lab/Order associations:   ICD-10-CM   1. Coronary artery disease involving native coronary artery of native heart without angina pectoris  I25.10     2. Controlled type 2 diabetes mellitus with retinopathy, with long-term current use of insulin , macular edema presence unspecified, unspecified laterality, unspecified retinopathy severity (HCC)  E11.319 Hemoglobin A1c   Z79.4 Comprehensive metabolic panel with GFR    CBC with Differential/Platelet    Lipid panel    3. Stage 3 chronic kidney disease, unspecified whether stage 3a or 3b CKD (HCC)  N18.30     4. Essential hypertension  I10     5. Hyperlipidemia associated with type 2 diabetes mellitus (HCC)  E11.69 Comprehensive metabolic panel with GFR   E78.5 CBC with Differential/Platelet    Lipid panel      Meds ordered this encounter  Medications   chlorthalidone  (HYGROTON ) 25 MG tablet    Sig: Take 0.5 tablets (12.5 mg total) by mouth daily.    Dispense:  45 tablet    Refill:  3   losartan (COZAAR) 50 MG tablet    Sig: Take 1 tablet (50 mg total) by mouth daily.    Dispense:  90 tablet    Refill:  3    Return precautions advised.  Clarisa Crooked, MD

## 2023-12-04 ENCOUNTER — Ambulatory Visit: Payer: Self-pay | Admitting: Family Medicine

## 2023-12-04 ENCOUNTER — Other Ambulatory Visit: Payer: Self-pay | Admitting: *Deleted

## 2023-12-04 DIAGNOSIS — M25641 Stiffness of right hand, not elsewhere classified: Secondary | ICD-10-CM | POA: Diagnosis not present

## 2023-12-04 DIAGNOSIS — I70213 Atherosclerosis of native arteries of extremities with intermittent claudication, bilateral legs: Secondary | ICD-10-CM

## 2023-12-04 DIAGNOSIS — M79641 Pain in right hand: Secondary | ICD-10-CM | POA: Diagnosis not present

## 2023-12-04 DIAGNOSIS — I739 Peripheral vascular disease, unspecified: Secondary | ICD-10-CM

## 2023-12-04 LAB — COMPREHENSIVE METABOLIC PANEL WITH GFR
ALT: 13 U/L (ref 0–53)
AST: 18 U/L (ref 0–37)
Albumin: 3.8 g/dL (ref 3.5–5.2)
Alkaline Phosphatase: 53 U/L (ref 39–117)
BUN: 26 mg/dL — ABNORMAL HIGH (ref 6–23)
CO2: 25 meq/L (ref 19–32)
Calcium: 9.7 mg/dL (ref 8.4–10.5)
Chloride: 105 meq/L (ref 96–112)
Creatinine, Ser: 1.45 mg/dL (ref 0.40–1.50)
GFR: 45.27 mL/min — ABNORMAL LOW (ref >60.00)
Glucose, Bld: 206 mg/dL — ABNORMAL HIGH (ref 70–99)
Potassium: 4.2 meq/L (ref 3.5–5.1)
Sodium: 138 meq/L (ref 135–145)
Total Bilirubin: 0.5 mg/dL (ref 0.2–1.2)
Total Protein: 7.6 g/dL (ref 6.0–8.3)

## 2023-12-04 LAB — LIPID PANEL
Cholesterol: 120 mg/dL (ref 0–200)
HDL: 39.3 mg/dL (ref >39.00)
LDL Cholesterol: 63 mg/dL (ref 0–99)
NonHDL: 81.09
Total CHOL/HDL Ratio: 3
Triglycerides: 88 mg/dL (ref 0.0–149.0)
VLDL: 17.6 mg/dL (ref 0.0–40.0)

## 2023-12-04 NOTE — Addendum Note (Signed)
 Addended by: Celedonio Coil on: 12/04/2023 09:02 AM   Modules accepted: Orders

## 2023-12-04 NOTE — Progress Notes (Signed)
US orders placed.

## 2023-12-08 ENCOUNTER — Other Ambulatory Visit: Payer: Self-pay

## 2023-12-08 DIAGNOSIS — M79641 Pain in right hand: Secondary | ICD-10-CM | POA: Diagnosis not present

## 2023-12-08 DIAGNOSIS — D619 Aplastic anemia, unspecified: Secondary | ICD-10-CM

## 2023-12-10 DIAGNOSIS — M25641 Stiffness of right hand, not elsewhere classified: Secondary | ICD-10-CM | POA: Diagnosis not present

## 2023-12-15 DIAGNOSIS — M25641 Stiffness of right hand, not elsewhere classified: Secondary | ICD-10-CM | POA: Diagnosis not present

## 2023-12-16 ENCOUNTER — Other Ambulatory Visit (INDEPENDENT_AMBULATORY_CARE_PROVIDER_SITE_OTHER)

## 2023-12-16 ENCOUNTER — Ambulatory Visit: Payer: Self-pay | Admitting: Family Medicine

## 2023-12-16 DIAGNOSIS — D619 Aplastic anemia, unspecified: Secondary | ICD-10-CM

## 2023-12-16 LAB — CBC WITH DIFFERENTIAL/PLATELET
Basophils Absolute: 0 10*3/uL (ref 0.0–0.1)
Basophils Relative: 0.3 % (ref 0.0–3.0)
Eosinophils Absolute: 0.3 10*3/uL (ref 0.0–0.7)
Eosinophils Relative: 7.1 % — ABNORMAL HIGH (ref 0.0–5.0)
HCT: 39.7 % (ref 39.0–52.0)
Hemoglobin: 13.1 g/dL (ref 13.0–17.0)
Lymphocytes Relative: 23 % (ref 12.0–46.0)
Lymphs Abs: 1 10*3/uL (ref 0.7–4.0)
MCHC: 32.9 g/dL (ref 30.0–36.0)
MCV: 87.9 fl (ref 78.0–100.0)
Monocytes Absolute: 0.4 10*3/uL (ref 0.1–1.0)
Monocytes Relative: 9.4 % (ref 3.0–12.0)
Neutro Abs: 2.6 10*3/uL (ref 1.4–7.7)
Neutrophils Relative %: 60.2 % (ref 43.0–77.0)
Platelets: 166 10*3/uL (ref 150.0–400.0)
RBC: 4.51 Mil/uL (ref 4.22–5.81)
RDW: 14.4 % (ref 11.5–15.5)
WBC: 4.3 10*3/uL (ref 4.0–10.5)

## 2023-12-19 ENCOUNTER — Other Ambulatory Visit: Payer: Self-pay | Admitting: Family Medicine

## 2023-12-23 ENCOUNTER — Ambulatory Visit

## 2023-12-23 ENCOUNTER — Other Ambulatory Visit (HOSPITAL_COMMUNITY)

## 2023-12-23 ENCOUNTER — Encounter (HOSPITAL_COMMUNITY)

## 2023-12-29 DIAGNOSIS — M79641 Pain in right hand: Secondary | ICD-10-CM | POA: Diagnosis not present

## 2023-12-29 DIAGNOSIS — M25641 Stiffness of right hand, not elsewhere classified: Secondary | ICD-10-CM | POA: Diagnosis not present

## 2024-01-04 DIAGNOSIS — M79641 Pain in right hand: Secondary | ICD-10-CM | POA: Diagnosis not present

## 2024-01-04 DIAGNOSIS — M25641 Stiffness of right hand, not elsewhere classified: Secondary | ICD-10-CM | POA: Diagnosis not present

## 2024-01-12 DIAGNOSIS — M25641 Stiffness of right hand, not elsewhere classified: Secondary | ICD-10-CM | POA: Diagnosis not present

## 2024-01-12 DIAGNOSIS — M79641 Pain in right hand: Secondary | ICD-10-CM | POA: Diagnosis not present

## 2024-01-15 ENCOUNTER — Other Ambulatory Visit: Payer: Self-pay | Admitting: Family Medicine

## 2024-01-19 DIAGNOSIS — M79641 Pain in right hand: Secondary | ICD-10-CM | POA: Diagnosis not present

## 2024-01-19 DIAGNOSIS — M25641 Stiffness of right hand, not elsewhere classified: Secondary | ICD-10-CM | POA: Diagnosis not present

## 2024-01-25 DIAGNOSIS — M79641 Pain in right hand: Secondary | ICD-10-CM | POA: Diagnosis not present

## 2024-01-25 DIAGNOSIS — M25641 Stiffness of right hand, not elsewhere classified: Secondary | ICD-10-CM | POA: Diagnosis not present

## 2024-01-26 DIAGNOSIS — M79641 Pain in right hand: Secondary | ICD-10-CM | POA: Diagnosis not present

## 2024-02-01 DIAGNOSIS — M79641 Pain in right hand: Secondary | ICD-10-CM | POA: Diagnosis not present

## 2024-02-01 DIAGNOSIS — M25641 Stiffness of right hand, not elsewhere classified: Secondary | ICD-10-CM | POA: Diagnosis not present

## 2024-02-10 ENCOUNTER — Ambulatory Visit (INDEPENDENT_AMBULATORY_CARE_PROVIDER_SITE_OTHER): Admitting: Physician Assistant

## 2024-02-10 ENCOUNTER — Ambulatory Visit (HOSPITAL_COMMUNITY)
Admission: RE | Admit: 2024-02-10 | Discharge: 2024-02-10 | Disposition: A | Discharge status: Home / Self Care | Source: Ambulatory Visit | Attending: Physician Assistant | Admitting: Physician Assistant

## 2024-02-10 ENCOUNTER — Encounter: Payer: Self-pay | Admitting: Physician Assistant

## 2024-02-10 ENCOUNTER — Ambulatory Visit (HOSPITAL_BASED_OUTPATIENT_CLINIC_OR_DEPARTMENT_OTHER)
Admission: RE | Admit: 2024-02-10 | Discharge: 2024-02-10 | Disposition: A | Discharge status: Home / Self Care | Source: Ambulatory Visit | Attending: Physician Assistant | Admitting: Physician Assistant

## 2024-02-10 VITALS — BP 149/74 | HR 74 | Temp 97.7°F | Wt 206.1 lb

## 2024-02-10 DIAGNOSIS — I739 Peripheral vascular disease, unspecified: Secondary | ICD-10-CM | POA: Insufficient documentation

## 2024-02-10 DIAGNOSIS — I70213 Atherosclerosis of native arteries of extremities with intermittent claudication, bilateral legs: Secondary | ICD-10-CM

## 2024-02-10 LAB — VAS US ABI WITH/WO TBI
Left ABI: 0.84
Right ABI: 0.86

## 2024-02-10 NOTE — Progress Notes (Signed)
 Office Note     CC:  follow up Requesting Provider:  Katrinka Garnette KIDD, MD  HPI: Matthew CHAPPLE is a 83 y.o. (1941/02/12) male who presents for routine surveillance of PAD with history of bypass graft involving bilateral lower extremities.  He had a right femoral to above-the-knee popliteal bypass with vein by Dr. Harvey in October 2021.  He has also had a left femoral endarterectomy and femoral to above-the-knee popliteal bypass with vein by Dr. Harvey in August 2021.  Bypasses were performed due to disabling claudication.  He denies any return of symptoms.  He also is without rest pain or tissue loss of bilateral lower extremities.  He is on a daily aspirin  and statin.  He is a former smoker.   Past Medical History:  Diagnosis Date   Cataract, nuclear sclerotic, right eye 11/20/2020   CLAUDICATION 05/14/2007   CORONARY ARTERY DISEASE 12/28/2006   Myoview  8/21: EF 73, normal perfusion, low risk    Cystoid macular edema of left eye 03/20/2021   Diabetes mellitus type II, controlled (HCC) 12/28/2006   Actos  30mg , insulin  70/30 20 units BID, amaryl  1mg  previously a1c <8. Worsened due to cold over 5 weeks around 05/2014 eating poorly and eating sugary syrups.   Stomach irritation on metformin.  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   Vitrectomy 11/21/2021    Past Surgical History:  Procedure Laterality Date    ABDOMINAL AORTOGRAM W/LOWER EXTREMITY N/A 02/10/2020   Procedure: ABDOMINAL AORTOGRAM W/LOWER EXTREMITY;  Surgeon: Harvey Carlin BRAVO, MD;  Location: [facility];  Service: Cardiovascular;  Laterality: N/A;   CATARACT EXTRACTION Left 06/21/2020   CATARACT EXTRACTION Right    COLONOSCOPY     CORONARY ARTERY BYPASS GRAFT     ENDARTERECTOMY FEMORAL Left 02/27/2020   Procedure: LEFT COMMON FEMORAL ENDARTERECTOMY;  Surgeon: Harvey Carlin BRAVO, MD;  Location: [facility] OR;  Service: Vascular;  Laterality: Left;   FEMORAL-POPLITEAL BYPASS GRAFT Left 02/27/2020   Procedure: LEFT FEMORAL-ABOVE KNEE POPLITEAL BYPASS WITH NON REVERSED GREATER SAPHENOUS VEIN;  Surgeon: Harvey Carlin BRAVO, MD;  Location: [facility] OR;  Service: Vascular;  Laterality: Left;   FEMORAL-POPLITEAL BYPASS GRAFT Right 04/23/2020   Procedure: BYPASS GRAFT FEMORAL- Above Knee POPLITEAL ARTERY RIGHT Using Right Greater Saphenous Vein;  Surgeon: Harvey Carlin BRAVO, MD;  Location: [facility] OR;  Service: Vascular;  Laterality: Right;   KNEE ARTHROSCOPY  2012   UPPER GASTROINTESTINAL ENDOSCOPY      Social History   Socioeconomic History   Marital status: Married    Spouse name: Not on file   Number of children: 2   Years of education: Not on file   Highest education level: Bachelor's degree (e.g., BA, AB, BS)  Occupational History   Occupation: retired  Tobacco Use   Smoking status: Former    Current packs/day:  0.00    Types: Cigarettes    Quit date: 07/09/1968    Years since quitting: 55.6    Passive exposure: Never   Smokeless tobacco: Former    Types: Chew    Quit date: 07/09/1988   Tobacco comments:    minimal use x 10 years   Vaping Use   Vaping status: Never Used  Substance and Sexual Activity   Alcohol use: No    Comment: last was 6 months   Drug use: No   Sexual activity: Not on file  Other Topics Concern   Not on file  Social History Narrative   Married 1983. 2 sons. 1st grandson 27 months old in 48- younger son in  [place]. 51 year old granddaughter with older son in [place].      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       Hobbies: volunteer work, watch sports   Social Drivers of Health   Financial Resource Strain: Low Risk  (12/01/2023)   Overall Financial Resource Strain (CARDIA)    Difficulty of Paying Living Expenses: Not hard at all  Food Insecurity: No Food Insecurity (12/01/2023)   Hunger Vital Sign    Worried About Running Out of Food in the Last Year: Never true    Ran Out of Food in the Last Year: Never true  Transportation Needs: No Transportation Needs (12/01/2023)   PRAPARE - Administrator, Civil Service (Medical): No    Lack of Transportation (Non-Medical): No  Physical Activity: Insufficiently Active (12/01/2023)   Exercise Vital Sign    Days of Exercise per Week: 1 day    Minutes of Exercise per Session: 20 min  Stress: No Stress Concern Present (12/01/2023)   [employer] of Occupational Health - Occupational Stress Questionnaire    Feeling of Stress : Not at all  Social Connections: Moderately Integrated (12/01/2023)   Social Connection and Isolation Panel    Frequency of Communication with Friends and Family: Three times a week    Frequency of Social Gatherings with Friends and Family: Once a week    Attends Religious Services: 1 to 4 times per year    Active Member of [employer] or Organizations: No    Attends Engineer, structural: Not on file    Marital Status: Married  Catering manager Violence: Not on file    Family History  Problem Relation Age of Onset   Diabetes Mother    Hypertension Mother    Stroke Father    Colon cancer Neg Hx    Esophageal cancer Neg Hx    Rectal cancer Neg Hx    Stomach cancer Neg Hx     Current Outpatient Medications  Medication Sig Dispense Refill   methylPREDNISolone (MEDROL DOSEPAK) 4 MG TBPK tablet Take by mouth as directed.     aspirin  EC 81 MG  tablet Take 81 mg by mouth daily. Swallow whole.     atorvastatin  (LIPITOR) 40 MG tablet Take 1 tablet by mouth once daily 90 tablet 0   Azelastine  HCl 137 MCG/SPRAY SOLN USE 1 SPRAY(S) IN EACH NOSTRIL TWICE DAILY AS DIRECTED 30 mL 0   chlorthalidone  (HYGROTON ) 25 MG tablet Take 0.5 tablets (12.5 mg total) by mouth daily. 45 tablet 3   cholecalciferol  (VITAMIN D3) 25 MCG (1000 UNIT) tablet Take 1,000 Units by mouth daily.     colchicine  0.6 MG tablet Take 1 tablet (0.6 mg total) by mouth daily  as needed (For gout pain in right hand. Take for 4 days only per episode.). 30 tablet 0   Continuous Glucose Receiver (FREESTYLE LIBRE 14 DAY READER) DEVI 2 each by Does not apply route daily. 2 each 11   Continuous Glucose Sensor (FREESTYLE LIBRE 14 DAY SENSOR) MISC 2 each by Does not apply route daily. 2 each 11   cyclobenzaprine  (FLEXERIL ) 5 MG tablet TAKE ONE TABLET BY MOUTH TWICE DAILY AS NEEDED FOR MUSCLE SPASM 30 tablet 0   fexofenadine (ALLEGRA) 180 MG tablet Take 180 mg by mouth daily.     fluticasone  (FLONASE ) 50 MCG/ACT nasal spray USE 2 SPRAY(S) IN EACH NOSTRIL ONCE DAILY AS NEEDED 16 g 0   insulin  NPH-regular Human (NOVOLIN  70/30 RELION) (70-30) 100 UNIT/ML injection INJECT 22 UNITS INTO THE SKIN IN THE MORNING AND 20 UNITS IN THE EVENING 10 mL 0   losartan  (COZAAR ) 50 MG tablet Take 1 tablet (50 mg total) by mouth daily. 90 tablet 3   metoprolol  succinate (TOPROL -XL) 100 MG 24 hr tablet TAKE 1 TABLET EVERY DAY WITH OR IMMMEDIATELY FOLLOWING A MEAL 90 tablet 3   NIFEdipine  (PROCARDIA -XL) 30 MG (OSM) 24 hr tablet Take 30 mg by mouth 2 (two) times daily.     oxymetazoline  (AFRIN) 0.05 % nasal spray Place 1 spray into both nostrils 2 (two) times daily as needed for congestion.     pioglitazone  (ACTOS ) 30 MG tablet Take 1 tablet (30 mg total) by mouth daily. 90 tablet 3   No current facility-administered medications for this visit.    Allergies  Allergen Reactions   Penicillins Hives    Probenecid      Developed lump on chest- went away when he stopped the medicine   Tetracycline Hcl Hives   Allopurinol  Itching     REVIEW OF SYSTEMS:  Negative unless noted in HPI [X]  denotes positive finding, [ ]  denotes negative finding Cardiac  Comments:  Chest pain or chest pressure:    Shortness of breath upon exertion:    Short of breath when lying flat:    Irregular heart rhythm:        Vascular    Pain in calf, thigh, or hip brought on by ambulation:    Pain in feet at night that wakes you up from your sleep:     Blood clot in your veins:    Leg swelling:         Pulmonary    Oxygen at home:    Productive cough:     Wheezing:         Neurologic    Sudden weakness in arms or legs:     Sudden numbness in arms or legs:     Sudden onset of difficulty speaking or slurred speech:    Temporary loss of vision in one eye:     Problems with dizziness:         Gastrointestinal    Blood in stool:     Vomited blood:         Genitourinary    Burning when urinating:     Blood in urine:        Psychiatric    Major depression:         Hematologic    Bleeding problems:    Problems with blood clotting too easily:        Skin    Rashes or ulcers:        Constitutional    Fever or chills:  PHYSICAL EXAMINATION:  Vitals:   02/10/24 1237  BP: (!) 149/74  Pulse: 74  Temp: 97.7 F (36.5 C)  TempSrc: Temporal  Weight: 206 lb 1.6 oz (93.5 kg)    General:  WDWN in NAD; vital signs documented above Gait: Not observed HENT: WNL, normocephalic Pulmonary: normal non-labored breathing Cardiac: regular HR Abdomen: soft, NT, no masses Skin: without rashes Vascular Exam/Pulses: palpable DP pulses Extremities: without ischemic changes, without Gangrene , without cellulitis; without open wounds;  Musculoskeletal: no muscle wasting or atrophy  Neurologic: A&O X 3 Psychiatric:  The pt has Normal affect.   Non-Invasive Vascular Imaging:   Duplex demonstrates  widely patent bypass grafts on bilateral lower extremities  ABI/TBIToday's ABIToday's TBIPrevious ABIPrevious TBI  +-------+-----------+-----------+------------+------------+  Right 0.86       0.46       0.96        0.61          +-------+-----------+-----------+------------+------------+  Left  0.84       0.52       1.01        0.87          +-------+-----------+-----------+------------+------------+     ASSESSMENT/PLAN:: 83 y.o. male here for follow up for surveillance of PAD with history of bilateral lower extremity bypass surgery  Bilateral lower extremities well-perfused with palpable DP pulses.  Duplex demonstrates patent bypass grafts of bilateral lower extremities without any hemodynamically significant stenosis.  He continues to be without claudication symptoms.  He will continue his aspirin  and statin daily.  I encouraged him to walk for exercise.  We will repeat imaging studies in another year.  He knows to call/return office sooner with any questions or concerns.   Donnice Sender, PA-C [facility] [phone]  Clinic MD:   Sheree

## 2024-02-18 DIAGNOSIS — M79641 Pain in right hand: Secondary | ICD-10-CM | POA: Diagnosis not present

## 2024-02-18 DIAGNOSIS — M25641 Stiffness of right hand, not elsewhere classified: Secondary | ICD-10-CM | POA: Diagnosis not present

## 2024-03-08 ENCOUNTER — Encounter: Payer: Self-pay | Admitting: Dermatology

## 2024-03-08 ENCOUNTER — Ambulatory Visit: Payer: Medicare Other | Admitting: Dermatology

## 2024-03-08 VITALS — BP 132/61

## 2024-03-08 DIAGNOSIS — L820 Inflamed seborrheic keratosis: Secondary | ICD-10-CM | POA: Diagnosis not present

## 2024-03-08 DIAGNOSIS — D485 Neoplasm of uncertain behavior of skin: Secondary | ICD-10-CM | POA: Diagnosis not present

## 2024-03-08 DIAGNOSIS — L219 Seborrheic dermatitis, unspecified: Secondary | ICD-10-CM

## 2024-03-08 DIAGNOSIS — L819 Disorder of pigmentation, unspecified: Secondary | ICD-10-CM | POA: Diagnosis not present

## 2024-03-08 DIAGNOSIS — L821 Other seborrheic keratosis: Secondary | ICD-10-CM

## 2024-03-08 DIAGNOSIS — L089 Local infection of the skin and subcutaneous tissue, unspecified: Secondary | ICD-10-CM | POA: Diagnosis not present

## 2024-03-08 DIAGNOSIS — L989 Disorder of the skin and subcutaneous tissue, unspecified: Secondary | ICD-10-CM

## 2024-03-08 DIAGNOSIS — L409 Psoriasis, unspecified: Secondary | ICD-10-CM

## 2024-03-08 MED ORDER — CLOBETASOL PROPIONATE 0.05 % EX SOLN
1.0000 | Freq: Two times a day (BID) | CUTANEOUS | 2 refills | Status: AC
Start: 2024-03-08 — End: ?

## 2024-03-08 MED ORDER — KETOCONAZOLE 2 % EX CREA: 1.0000 | TOPICAL_CREAM | Freq: Every day | CUTANEOUS | 2 refills | Status: AC

## 2024-03-08 NOTE — Patient Instructions (Addendum)
 Date: Tue Mar 08 2024  Hello,  Thank you for visiting today. Here is a summary of the key instructions:  - Medications:   - Apply ketoconazole  cream to face daily for 4-6 weeks   - Use clobetasol  liquid on scalp spots as directed  - Skin Care:   - Use Z-Bar VanniCream face wash to prevent recurrence of white patches   - Apply Vaseline to biopsy sites daily   - Keep biopsy sites clean with soap and water   - Cover biopsy sites with bandaids  - Follow-up:   - Return for follow-up appointment in 2-3 months   - Expect a message with biopsy results  - Other Instructions:   - If symptoms return: Use ketoconazole  cream on face again   - If symptoms return: Apply clobetasol  liquid to scalp again  Please reach out if you have any questions or concerns.  Warm regards,  Dr. Delon Len[facility]         Patient Handout: Wound Care for Skin Biopsy Site  Taking Care of Your Skin Biopsy Site  Proper care of the biopsy site is essential for promoting healing and minimizing scarring. This handout provides instructions on how to care for your biopsy site to ensure optimal recovery.  1. Cleaning the Wound:  Clean the biopsy site daily with gentle soap and water. Gently pat the area dry with a clean, soft towel. Avoid harsh scrubbing or rubbing the area, as this can irritate the skin and delay healing.  2. Applying Aquaphor and Bandage:  After cleaning the wound, apply a thin layer of Aquaphor ointment to the biopsy site. Cover the area with a sterile bandage to protect it from dirt, bacteria, and friction. Change the bandage daily or as needed if it becomes soiled or wet.  3. Continued Care for One Week:  Repeat the cleaning, Aquaphor application, and bandaging process daily for one week following the biopsy procedure. Keeping the wound clean and moist during this initial healing period will help prevent infection and promote optimal healing.  4. Massaging Aquaphor  into the Area:  ---After one week, discontinue the use of bandages but continue to apply Aquaphor to the biopsy site. ----Gently massage the Aquaphor into the area using circular motions. ---Massaging the skin helps to promote circulation and prevent the formation of scar tissue.   Additional Tips:  Avoid exposing the biopsy site to direct sunlight during the healing process, as this can cause hyperpigmentation or worsen scarring. If you experience any signs of infection, such as increased redness, swelling, warmth, or drainage from the wound, contact your healthcare provider immediately. Follow any additional instructions provided by your healthcare provider for caring for the biopsy site and managing any discomfort. Conclusion:  Taking proper care of your skin biopsy site is crucial for ensuring optimal healing and minimizing scarring. By following these instructions for cleaning, applying Aquaphor, and massaging the area, you can promote a smooth and successful recovery. If you have any questions or concerns about caring for your biopsy site, don't hesitate to contact your healthcare provider for guidance.      Important Information  Due to recent changes in healthcare laws, you may see results of your pathology and/or laboratory studies on MyChart before the doctors have had a chance to review them. We understand that in some cases there may be results that are confusing or concerning to you. Please understand that not all results are received at the same time and often the doctors  may need to interpret multiple results in order to provide you with the best plan of care or course of treatment. Therefore, we ask that you please give us  2 business days to thoroughly review all your results before contacting the office for clarification. Should we see a critical lab result, you will be contacted sooner.   If You Need Anything After Your Visit  If you have any questions or concerns for your  doctor, please call our main line at [phone] If no one answers, please leave a voicemail as directed and we will return your call as soon as possible. Messages left after 4 pm will be answered the following business day.   You may also send us  a message via MyChart. We typically respond to MyChart messages within 1-2 business days.  For prescription refills, please ask your pharmacy to contact our office. Our fax number is [phone].  If you have an urgent issue when the clinic is closed that cannot wait until the next business day, you can page your doctor at the number below.    Please note that while we do our best to be available for urgent issues outside of office hours, we are not available 24/7.   If you have an urgent issue and are unable to reach us , you may choose to seek medical care at your doctor's office, retail clinic, urgent care center, or emergency room.  If you have a medical emergency, please immediately call 911 or go to the emergency department. In the event of inclement weather, please call our main line at [phone] for an update on the status of any delays or closures.  Dermatology Medication Tips: Please keep the boxes that topical medications come in in order to help keep track of the instructions about where and how to use these. Pharmacies typically print the medication instructions only on the boxes and not directly on the medication tubes.   If your medication is too expensive, please contact our office at [phone] or send us  a message through MyChart.   We are unable to tell what your co-pay for medications will be in advance as this is different depending on your insurance coverage. However, we may be able to find a substitute medication at lower cost or fill out paperwork to get insurance to cover a needed medication.   If a prior authorization is required to get your medication covered by your insurance company, please allow us  1-2 business days  to complete this process.  Drug prices often vary depending on where the prescription is filled and some pharmacies may offer cheaper prices.  The website www.goodrx.com contains coupons for medications through different pharmacies. The prices here do not account for what the cost may be with help from insurance (it may be cheaper with your insurance), but the website can give you the price if you did not use any insurance.  - You can print the associated coupon and take it with your prescription to the pharmacy.  - You may also stop by our office during regular business hours and pick up a GoodRx coupon card.  - If you need your prescription sent electronically to a different pharmacy, notify our office through [facility] or by phone at [phone]

## 2024-03-08 NOTE — Progress Notes (Signed)
 New Patient Visit   Subjective  Matthew [name] is a 83 y.o. male who presents for the following: Growths of right scalp since January. He initially thought it was dandruff but it has gotten a little worse. He also has a similar spot of his forehead that has been there for about 3 months.  He also has a hypopigmented patch of his right upper lip and neck that he has noticed in the last \\few  months. It is not itchy but it gets scaly from time to time.    The following portions of the chart were reviewed this encounter and updated as appropriate: medications, allergies, medical history  Review of Systems:  No other skin or systemic complaints except as noted in HPI or Assessment and Plan.  Objective  Well appearing patient in no apparent distress; mood and affect are within normal limits.   A focused examination was performed of the following areas: scalp, face and neck   Relevant exam findings are noted in the Assessment and Plan.          Right Forehead 6 mm inflamed verrucous papule  Right Postauricular Sulcus Pigmented hyperkeratotic plaque   Assessment & Plan    1. Seborrheic Dermatitis with Hypopigmentation - Assessment: Patient presents with hypopigmented patches on left nasolabial fold and neck. 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 Over-the-counter treatments and shampoo changes have been ineffective. - Plan:    Prescribe ketoconazole  topical antifungal cream daily for 4 to 6 weeks    Recommend Z-Bar Byveni Cream zinc-based face wash to prevent recurrence    Follow-up in 2 to 3 months to assess clearance    Reinitiate treatment with prescribed cream if condition recurs  2. Scalp Lesions with Inflammatory Component - Assessment: Scalp lesions present since January located behind right ear and on right parietal scalp with itching and spreading. Differential  diagnosis includes psoriasis, seborrheic dermatitis, and discoid lupus. Isolated and inflamed appearance warrants further investigation. - Plan:    Perform shave biopsy of scalp lesion    Send biopsy sample to lab for analysis    Prescribe clobetasol  liquid for symptomatic relief    Instruct to use Vaseline, soap and water, and bandaid for biopsy site care    Communicate biopsy results to patient via message  3. Hyperkeratotic Papule on Right Forehead - Assessment: Newer hyperkeratotic papule on right forehead appearing couple of months ago. Appears benign but requires investigation to rule out malignancy. - Plan:    Perform shave biopsy of hyperkeratotic papule    Send biopsy sample to lab for analysis    Instruct to use Vaseline, soap and water, and bandaid for biopsy site care    Communicate biopsy results to patient via message    Follow-up in 3 months. NEOPLASM OF UNCERTAIN BEHAVIOR OF SKIN (2) Right Forehead Skin / nail biopsy Type of biopsy: tangential   Informed consent: discussed and consent obtained   Timeout: patient name, date of birth, surgical site, and procedure verified   Procedure prep:  Patient was prepped and draped in usual sterile fashion Prep type:  Isopropyl alcohol Anesthesia: the lesion was anesthetized in a standard fashion   Anesthetic:  1% lidocaine  w/ epinephrine 1-100,000 buffered w/ 8.4% NaHCO3 Instrument used: flexible razor blade   Hemostasis achieved with: pressure, aluminum chloride and electrodesiccation   Outcome: patient tolerated procedure well   Post-procedure details: sterile dressing applied and wound care instructions given  Dressing type: bandage and petrolatum    Specimen 1 - Surgical pathology Differential Diagnosis: HAK vs Verruca vs SCC  Check Margins: No Right Postauricular Sulcus Skin / nail biopsy Type of biopsy: tangential   Informed consent: discussed and consent obtained   Timeout: patient name, date of birth, surgical  site, and procedure verified   Procedure prep:  Patient was prepped and draped in usual sterile fashion Prep type:  Isopropyl alcohol Anesthesia: the lesion was anesthetized in a standard fashion   Anesthetic:  1% lidocaine  w/ epinephrine 1-100,000 buffered w/ 8.4% NaHCO3 Instrument used: flexible razor blade   Hemostasis achieved with: pressure, aluminum chloride and electrodesiccation   Outcome: patient tolerated procedure well   Post-procedure details: sterile dressing applied and wound care instructions given   Dressing type: bandage and petrolatum    Specimen 2 - Surgical pathology Differential Diagnosis: Psoriasis vs seb derm vs discoid lupus (2 pieces)  Check Margins: No  Return in about 3 months (around 06/07/2024) for Biopsy Follow up.  I, Roseline Hutchinson, CMA, am acting as scribe for [employer], DO .   Documentation: I have reviewed the above documentation for accuracy and completeness, and I agree with the above.  Delon Lenis, DO

## 2024-03-09 LAB — SURGICAL PATHOLOGY

## 2024-03-10 ENCOUNTER — Ambulatory Visit: Payer: Self-pay | Admitting: Dermatology

## 2024-03-10 NOTE — Progress Notes (Signed)
 HI Shirron,  Please call patient and let him know that the bx from his scalp confirmed a diagnosis of psoriasis.  The treatment for this is the clobetasol  solution we rx'd for him at his visit.  He should continue applying this daily to all spots until they resolve.  The lesion on his forehead that was bx' came back as a benign seborrheic keratosis.  No additional tx is required.  He should continue to apply the topical aquaphor or vaseline daily until healed.  Thanks!  Dr JONETTA

## 2024-03-14 DIAGNOSIS — N1831 Chronic kidney disease, stage 3a: Secondary | ICD-10-CM | POA: Diagnosis not present

## 2024-03-21 DIAGNOSIS — E559 Vitamin D deficiency, unspecified: Secondary | ICD-10-CM | POA: Diagnosis not present

## 2024-03-21 DIAGNOSIS — E1122 Type 2 diabetes mellitus with diabetic chronic kidney disease: Secondary | ICD-10-CM | POA: Diagnosis not present

## 2024-03-21 DIAGNOSIS — N1831 Chronic kidney disease, stage 3a: Secondary | ICD-10-CM | POA: Diagnosis not present

## 2024-03-21 DIAGNOSIS — I129 Hypertensive chronic kidney disease with stage 1 through stage 4 chronic kidney disease, or unspecified chronic kidney disease: Secondary | ICD-10-CM | POA: Diagnosis not present

## 2024-03-21 DIAGNOSIS — R609 Edema, unspecified: Secondary | ICD-10-CM | POA: Diagnosis not present

## 2024-03-23 ENCOUNTER — Other Ambulatory Visit: Payer: Self-pay | Admitting: Family Medicine

## 2024-04-04 ENCOUNTER — Encounter: Payer: Self-pay | Admitting: Family Medicine

## 2024-04-04 ENCOUNTER — Ambulatory Visit: Admitting: Family Medicine

## 2024-04-04 VITALS — BP 132/68 | Temp 98.1°F | Ht 72.0 in | Wt 204.8 lb

## 2024-04-04 DIAGNOSIS — I10 Essential (primary) hypertension: Secondary | ICD-10-CM

## 2024-04-04 DIAGNOSIS — E785 Hyperlipidemia, unspecified: Secondary | ICD-10-CM | POA: Diagnosis not present

## 2024-04-04 DIAGNOSIS — N1832 Chronic kidney disease, stage 3b: Secondary | ICD-10-CM

## 2024-04-04 DIAGNOSIS — I2510 Atherosclerotic heart disease of native coronary artery without angina pectoris: Secondary | ICD-10-CM

## 2024-04-04 DIAGNOSIS — Z794 Long term (current) use of insulin: Secondary | ICD-10-CM

## 2024-04-04 DIAGNOSIS — E1169 Type 2 diabetes mellitus with other specified complication: Secondary | ICD-10-CM | POA: Diagnosis not present

## 2024-04-04 DIAGNOSIS — E119 Type 2 diabetes mellitus without complications: Secondary | ICD-10-CM

## 2024-04-04 LAB — POCT GLYCOSYLATED HEMOGLOBIN (HGB A1C): Hemoglobin A1C: 7.5 % — AB (ref 4.0–5.6)

## 2024-04-04 NOTE — Progress Notes (Signed)
 Phone [phone] In person visit   Subjective:   Matthew [name] is a 83 y.o. 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) 12/28/2006    Priority: High    Coronary artery disease s/p CABG 1997 12/28/2006    Priority: High   Diabetes mellitus type II, controlled (HCC) 12/28/2006    Priority: High   Aortic atherosclerosis 01/31/2022    Priority: Medium    Cervical arthritis (HCC) 10/25/2014    Priority: Medium    Erectile dysfunction 08/13/2010    Priority: Medium    CKD (chronic kidney disease), stage III (HCC) 09/15/2008    Priority: Medium    Hyperlipidemia associated with type 2 diabetes mellitus (HCC) 12/28/2006    Priority: Medium    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    Priority: Medium    Essential hypertension 12/28/2006    Priority: Medium    Allergic rhinitis 11/18/2016    Priority: Low   Pulmonary nodule seen on imaging study 05/17/2012    Priority: Low   Both eyes affected by mild nonproliferative diabetic retinopathy with macular edema, associated with type 2 diabetes mellitus (HCC) 09/19/2022   Full thickness macular hole, right 11/13/2021   Cystoid macular edema of left eye 03/20/2021   Type 2 macular telangiectasis, 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 Swallow whole.     atorvastatin  (LIPITOR) 40 MG tablet Take 1 tablet by mouth once daily 90 tablet 0   Azelastine  HCl 137 MCG/SPRAY SOLN USE 1  SPRAY(S) IN EACH NOSTRIL TWICE DAILY AS DIRECTED 30 mL 0   cholecalciferol  (VITAMIN D3) 25 MCG (1000 UNIT) tablet Take 1,000 Units by mouth daily.     clobetasol  (TEMOVATE ) 0.05 % external solution Apply 1 Application topically 2 (two) times daily. Apply to affected areas of scalp twice daily as needed. 50 mL 2   colchicine  0.6 MG tablet Take 1 tablet (0.6 mg total) by mouth daily as needed (For gout pain in right hand. Take for 4 days only per episode.). 30 tablet 0   Continuous Glucose Receiver (FREESTYLE LIBRE 14 DAY READER) DEVI 2 each by Does not apply route daily. 2 each 11   Continuous Glucose Sensor (FREESTYLE LIBRE 14 DAY SENSOR) MISC 2 each by Does not apply route daily. 2 each 11   cyclobenzaprine  (FLEXERIL ) 5 MG tablet TAKE ONE TABLET BY MOUTH TWICE DAILY AS NEEDED FOR MUSCLE SPASM 30 tablet 0   fexofenadine (ALLEGRA) 180 MG tablet Take 180 mg by mouth daily.     fluticasone  (FLONASE ) 50 MCG/ACT nasal spray USE 2 SPRAY(S) IN EACH NOSTRIL ONCE DAILY AS NEEDED 16 g 0   insulin  NPH-regular Human (NOVOLIN  70/30 RELION) (70-30) 100 UNIT/ML injection INJECT 22 UNITS INTO THE SKIN IN THE MORNING AND 20 UNITS IN THE EVENING 10 mL 0   ketoconazole  (NIZORAL ) 2 % cream Apply 1 Application topically daily. Apply to affected areas of face and  neck daily 60 g 2   losartan  (COZAAR ) 50 MG tablet Take 1 tablet (50 mg total) by mouth daily. 90 tablet 3   metoprolol  succinate (TOPROL -XL) 100 MG 24 hr tablet TAKE 1 TABLET EVERY DAY WITH OR IMMMEDIATELY FOLLOWING A MEAL 90 tablet 3   NIFEdipine  (PROCARDIA -XL) 30 MG (OSM) 24 hr tablet Take 30 mg by mouth 2 (two) times daily.     oxymetazoline  (AFRIN) 0.05 % nasal spray Place 1 spray into both nostrils 2 (two) times daily as needed for congestion.     pioglitazone  (ACTOS ) 30 MG tablet Take 1 tablet (30 mg total) by mouth daily. 90 tablet 3   No current facility-administered medications for this visit.     Objective:  BP 132/68 (BP Location: Left Arm,  Patient Position: Sitting, Cuff Size: Normal)   Temp 98.1 F (36.7 C) (Temporal)   Ht 6' (1.829 m)   Wt 204 lb 12.8 oz (92.9 kg)   BMI 27.78 kg/m  Gen: NAD, resting comfortably CV: RRR no murmurs rubs or gallops Lungs: CTAB no crackles, wheeze, rhonchi Abdomen: soft/nontender/nondistended/normal bowel sounds. No rebound or guarding.  Ext: trace edema Skin: warm, dry     Assessment and Plan    #mild anemia on our last labs- improved with nephrology Dr. Cheryn. They also noted Creatinine 1.65 and GFR 41 on 03/14/24 Dr. Dolan  # PAD-follows with vascular surgery- with right femoral to above knee popliteal bypass Dr. Harvey October 2021 and left femoral endarteretomy and left femoral to above knee popliteal artery bypass in August 2021 # CAD-follows with cardiology Dr. Wonda #hyperlipidemia S: Medication:Aspirin  81 mg, atorvastatin  40 mg Symptoms: no chest pain or shortness of breath . Some shin pain with walking Lab Results  Component Value Date   CHOL 120 12/03/2023   HDL 39.30 12/03/2023   LDLCALC 63 12/03/2023   LDLDIRECT 69.0 03/11/2021   TRIG 88.0 12/03/2023   CHOLHDL 3 12/03/2023  A/P: CAD-asymptomatic continue current medications  PAD-appears asymptomatic- Do not think shin pain is CAD related- continue to monitor   Hyperlipidemia-at goal- continue current medications    # Diabetes-tolerate A1c up to 8.5 to avoid lows S: Medication:Actos  30 mg daily, Novolin  70/30 with  22 units in the morning and  20 units in the evening -Prior glimepiride  stopped due to low sugars (also had to reduce insulin  level and for similar reasons) -GI side effects on metformin CBGs- uses freestyle libre. Reports last 7 days average 154 -Patient requires use of CONTINUOUS GLUCOSE MONITOR to monitor blood sugars. History of lows at 09/19/22 and meter helpful Last visit had been on prednisone  Lab Results  Component Value Date   HGBA1C 7.2 (A) 04/04/2024   HGBA1C 8.0 (H) 12/03/2023    HGBA1C 7.0 (H) 07/31/2023   A/P: a1c of 7.5 on Point of Care (POC) testing- continue current medications with reasonable control. Considered Mounjaro but with CKD and retinopathy history opted out -reports occasional slight low if not hungry- cuts his units to 15 but I advised cutting in half- we really really want to avoid lows. If anything lower than that asked him to let me know  # Microalbuminuria-from 280 before lisinopril  down to 68-later titrated losartan - check levels today- could go to 100 mg if worsening.    #hypertension # CKD stage III-GFR typically low 30s- 9s - sees [facility] Dr. Dolan S: medication: Lisinopril  10 mg--> losartan  50 mg with ? gout, chlorthalidone  25 mg--> 12.5 mg with gout risk, Procardia  30 mg twice  daily, metoprolol  100 mg extended release BP Readings from Last 3 Encounters:  04/04/24 132/68  03/08/24 132/61  02/10/24 (!) 149/74  A/P: blood pressure reasonable control- may be able to go to losartan  100 mg depending on microalbuminuria  #psoriasis- on biopsy right behind right ear - good with with Dr. Alm.   Recommended follow up: Return in about 4 months (around 08/05/2024) for followup or sooner if needed.Schedule b4 you leave. Future Appointments  Date Time Provider Department Center  06/09/2024 10:15 AM Alm Delon SAILOR, DO [facility] None    Lab/Order associations:   ICD-10-CM   1. Diabetes mellitus type II, controlled (HCC)  E11.9 POCT HgB A1C    2. Hyperlipidemia associated with type 2 diabetes mellitus (HCC)  E11.69 Urine Albumin/Creatinine with ratio (send out) [LAB689]   E78.5     3. Coronary artery disease involving native coronary artery of native heart without angina pectoris  I25.10     4. Stage 3b chronic kidney disease (HCC)  N18.32     5. Essential hypertension  I10       No orders of the defined types were placed in this encounter.   Return precautions advised.  Garnette Lukes, MD

## 2024-04-04 NOTE — Patient Instructions (Addendum)
 Please stop by lab before you go If you have mychart- we will send your results within 3 business days of us  receiving them.  If you do not have mychart- we will call you about results within 5 business days of us  receiving them.  *please also note that you will see labs on mychart as soon as they post. I will later go in and write notes on them- will say notes from Dr. Katrinka   No changes today unless labs lead us  to make changes  Lab Results  Component Value Date   HGBA1C 7.5 (A) 04/04/2024  Reasonable control -reports occasional slight low if not hungry- cuts his units to 15 but I advised cutting in half- we really really want to avoid lows  Recommended follow up: Return in about 4 months (around 08/05/2024) for followup or sooner if needed.Schedule b4 you leave.

## 2024-04-05 ENCOUNTER — Ambulatory Visit: Payer: Self-pay | Admitting: Family Medicine

## 2024-04-05 LAB — MICROALBUMIN / CREATININE URINE RATIO
Creatinine,U: 158.8 mg/dL
Microalb Creat Ratio: 108.1 mg/g — ABNORMAL HIGH (ref 0.0–30.0)
Microalb, Ur: 17.2 mg/dL — ABNORMAL HIGH (ref 0.0–1.9)

## 2024-04-13 DIAGNOSIS — E113291 Type 2 diabetes mellitus with mild nonproliferative diabetic retinopathy without macular edema, right eye: Secondary | ICD-10-CM | POA: Diagnosis not present

## 2024-04-13 DIAGNOSIS — E113212 Type 2 diabetes mellitus with mild nonproliferative diabetic retinopathy with macular edema, left eye: Secondary | ICD-10-CM | POA: Diagnosis not present

## 2024-04-13 DIAGNOSIS — H35341 Macular cyst, hole, or pseudohole, right eye: Secondary | ICD-10-CM | POA: Diagnosis not present

## 2024-04-13 DIAGNOSIS — H34832 Tributary (branch) retinal vein occlusion, left eye, with macular edema: Secondary | ICD-10-CM | POA: Diagnosis not present

## 2024-04-13 DIAGNOSIS — H35372 Puckering of macula, left eye: Secondary | ICD-10-CM | POA: Diagnosis not present

## 2024-04-13 DIAGNOSIS — H35352 Cystoid macular degeneration, left eye: Secondary | ICD-10-CM | POA: Diagnosis not present

## 2024-04-13 LAB — OPHTHALMOLOGY REPORT-SCANNED

## 2024-05-02 DIAGNOSIS — Z23 Encounter for immunization: Secondary | ICD-10-CM | POA: Diagnosis not present

## 2024-06-09 ENCOUNTER — Ambulatory Visit: Admitting: Dermatology

## 2024-06-09 ENCOUNTER — Encounter: Payer: Self-pay | Admitting: Dermatology

## 2024-06-09 VITALS — BP 127/63

## 2024-06-09 DIAGNOSIS — L408 Other psoriasis: Secondary | ICD-10-CM | POA: Diagnosis not present

## 2024-06-09 DIAGNOSIS — L409 Psoriasis, unspecified: Secondary | ICD-10-CM

## 2024-06-09 MED ORDER — CLOBETASOL PROPIONATE 0.05 % EX SOLN: 1.0000 | Freq: Two times a day (BID) | CUTANEOUS | 5 refills | Status: AC

## 2024-06-09 NOTE — Patient Instructions (Addendum)
 VISIT SUMMARY:  Today, you came in for a follow-up appointment to discuss the management of your scalp psoriasis. You have been using clobetasol  solution daily for six to seven weeks, which has helped resolve your symptoms. However, you have recently noticed occasional crusty areas on your scalp.  YOUR PLAN:  -SCALP PSORIASIS:  Scalp psoriasis is a chronic skin condition that causes red, itchy, and scaly patches on the scalp. It was confirmed by a biopsy in September.   You should continue using clobetasol  liquid drops on the affected areas of your scalp once or twice daily until the symptoms resolve.   Additionally, consider using DHS Zinc medicated shampoo to help control inflammation and prevent flare-ups. Monitor for any new plaques on your elbows, knees, or back and report them if they occur.   Make sure you have enough clobetasol  refills and contact us  via MyChart if you need more before your next annual visit.  INSTRUCTIONS:  Continue using clobetasol  liquid drops on affected scalp areas once or twice daily until symptoms resolve. Consider using DHS Zinc medicated shampoo. Monitor for new plaques on elbows, knees, or back and report if they occur. Contact us  via MyChart if you need clobetasol  refills before your next annual visit.        Important Information  Due to recent changes in healthcare laws, you may see results of your pathology and/or laboratory studies on MyChart before the doctors have had a chance to review them. We understand that in some cases there may be results that are confusing or concerning to you. Please understand that not all results are received at the same time and often the doctors may need to interpret multiple results in order to provide you with the best plan of care or course of treatment. Therefore, we ask that you please give us  2 business days to thoroughly review all your results before contacting the office for clarification. Should we see a  critical lab result, you will be contacted sooner.   If You Need Anything After Your Visit  If you have any questions or concerns for your doctor, please call our main line at [phone] If no one answers, please leave a voicemail as directed and we will return your call as soon as possible. Messages left after 4 pm will be answered the following business day.   You may also send us  a message via MyChart. We typically respond to MyChart messages within 1-2 business days.  For prescription refills, please ask your pharmacy to contact our office. Our fax number is [phone].  If you have an urgent issue when the clinic is closed that cannot wait until the next business day, you can page your doctor at the number below.    Please note that while we do our best to be available for urgent issues outside of office hours, we are not available 24/7.   If you have an urgent issue and are unable to reach us , you may choose to seek medical care at your doctor's office, retail clinic, urgent care center, or emergency room.  If you have a medical emergency, please immediately call 911 or go to the emergency department. In the event of inclement weather, please call our main line at [phone] for an update on the status of any delays or closures.  Dermatology Medication Tips: Please keep the boxes that topical medications come in in order to help keep track of the instructions about where and how to use these. Pharmacies typically print  the medication instructions only on the boxes and not directly on the medication tubes.   If your medication is too expensive, please contact our office at [phone] or send us  a message through MyChart.   We are unable to tell what your co-pay for medications will be in advance as this is different depending on your insurance coverage. However, we may be able to find a substitute medication at lower cost or fill out paperwork to get insurance to cover a needed  medication.   If a prior authorization is required to get your medication covered by your insurance company, please allow us  1-2 business days to complete this process.  Drug prices often vary depending on where the prescription is filled and some pharmacies may offer cheaper prices.  The website www.goodrx.com contains coupons for medications through different pharmacies. The prices here do not account for what the cost may be with help from insurance (it may be cheaper with your insurance), but the website can give you the price if you did not use any insurance.  - You can print the associated coupon and take it with your prescription to the pharmacy.  - You may also stop by our office during regular business hours and pick up a GoodRx coupon card.  - If you need your prescription sent electronically to a different pharmacy, notify our office through [facility] or by phone at [phone]

## 2024-06-09 NOTE — Progress Notes (Signed)
" ° °  Follow-Up Visit   Subjective  Matthew [name] is a 83 y.o. male established patient who presents for FOLLOW UP on the diagnoses listed below:  Patient (and/or pt guardian) consented to the use of AI-assisted tools for note generation.  Patient was last evaluated on 03/08/24.  Psoriasis: Pt stated that post-Bx he continued applying the clobetasol  solution for 6-7 weeks but now the Sx have resolved. Pt inquired if he is to use topical daily or only if Sx presents.  The following portions of the chart were reviewed this encounter and updated as appropriate: medications, allergies, medical history  Review of Systems:  No other skin or systemic complaints except as noted in HPI or Assessment and Plan.  Objective  Well appearing patient in no apparent distress; mood and affect are within normal limits.   A focused examination was performed of the following areas: scalp   Relevant exam findings are noted in the Assessment and Plan.   Assessment & Plan   SCALP PSORIASIS  Scalp psoriasis Confirmed by biopsy in September. Symptoms resolved with clobetasol  liquid drops over six to seven weeks. Occasional flaking and itching suggest recurrence. No current plaques on elbows, knees, or back. No cure for psoriasis, but inflammation can be managed with clobetasol .  - Use clobetasol  liquid drops on affected scalp areas once or twice daily until symptoms resolve. - Consider DHS Zinc medicated shampoo to help control inflammation and prevent flares. - Monitor for new plaques on elbows, knees, or back and report if he occurs. - Ensure clobetasol  refills are available; contact via MyChart if refills are needed before next annual visit.   No follow-ups on file.   Documentation: I have reviewed the above documentation for accuracy and completeness, and I agree with the above.  I, Shirron Maranda, CMA II, am acting as scribe for:  Delon Lenis, DO "

## 2024-08-08 ENCOUNTER — Ambulatory Visit: Admitting: Family Medicine

## 2024-10-24 ENCOUNTER — Ambulatory Visit: Admitting: Cardiovascular Disease
